# Patient Record
Sex: Female | Born: 1950
Health system: Southern US, Community
[De-identification: ages and names within clinical notes are randomized; demographics above are authoritative.]

## PROBLEM LIST (undated history)

## (undated) ENCOUNTER — Ambulatory Visit: Source: Home / Self Care

## (undated) DIAGNOSIS — Z87891 Personal history of nicotine dependence: Secondary | ICD-10-CM

## (undated) DIAGNOSIS — I5020 Unspecified systolic (congestive) heart failure: Secondary | ICD-10-CM

## (undated) DIAGNOSIS — Z951 Presence of aortocoronary bypass graft: Secondary | ICD-10-CM

## (undated) DIAGNOSIS — I2510 Atherosclerotic heart disease of native coronary artery without angina pectoris: Secondary | ICD-10-CM

## (undated) DIAGNOSIS — K279 Peptic ulcer, site unspecified, unspecified as acute or chronic, without hemorrhage or perforation: Secondary | ICD-10-CM

## (undated) DIAGNOSIS — I10 Essential (primary) hypertension: Secondary | ICD-10-CM

## (undated) DIAGNOSIS — I779 Disorder of arteries and arterioles, unspecified: Secondary | ICD-10-CM

## (undated) DIAGNOSIS — J449 Chronic obstructive pulmonary disease, unspecified: Secondary | ICD-10-CM

## (undated) DIAGNOSIS — I5022 Chronic systolic (congestive) heart failure: Secondary | ICD-10-CM

## (undated) DIAGNOSIS — I4891 Unspecified atrial fibrillation: Secondary | ICD-10-CM

## (undated) HISTORY — PX: CORONARY ARTERY BYPASS GRAFT: SHX141

## (undated) HISTORY — DX: Chronic obstructive pulmonary disease, unspecified: J44.9

## (undated) HISTORY — DX: Peptic ulcer, site unspecified, unspecified as acute or chronic, without hemorrhage or perforation: K27.9

## (undated) HISTORY — PX: OTHER SURGICAL HISTORY: SHX169

## (undated) HISTORY — PX: CHOLECYSTECTOMY: SHX55

## (undated) HISTORY — DX: Unspecified atrial fibrillation: I48.91

## (undated) HISTORY — PX: SPINE SURGERY: SHX786

## (undated) HISTORY — PX: ABDOMINAL HYSTERECTOMY: SHX81

## (undated) SURGERY — Surgical Case
Anesthesia: *Unknown

---

## 2021-04-06 ENCOUNTER — Ambulatory Visit (INDEPENDENT_AMBULATORY_CARE_PROVIDER_SITE_OTHER): Payer: 59

## 2021-04-06 ENCOUNTER — Encounter: Payer: Self-pay | Admitting: Emergency Medicine

## 2021-04-06 ENCOUNTER — Other Ambulatory Visit: Payer: Self-pay

## 2021-04-06 ENCOUNTER — Ambulatory Visit
Admission: EM | Admit: 2021-04-06 | Discharge: 2021-04-06 | Disposition: A | Discharge status: Home / Self Care | Payer: 59

## 2021-04-06 DIAGNOSIS — M25572 Pain in left ankle and joints of left foot: Secondary | ICD-10-CM | POA: Diagnosis not present

## 2021-04-06 DIAGNOSIS — W19XXXA Unspecified fall, initial encounter: Secondary | ICD-10-CM

## 2021-04-06 DIAGNOSIS — S93402A Sprain of unspecified ligament of left ankle, initial encounter: Secondary | ICD-10-CM | POA: Diagnosis not present

## 2021-04-06 HISTORY — DX: Atherosclerotic heart disease of native coronary artery without angina pectoris: I25.10

## 2021-04-06 HISTORY — DX: Essential (primary) hypertension: I10

## 2021-04-06 MED ORDER — HYDROCODONE-ACETAMINOPHEN 5-325 MG PO TABS: 1.0000 | ORAL_TABLET | ORAL | 0 refills | Status: DC | PRN

## 2021-04-06 NOTE — Discharge Instructions (Addendum)
Elevate foot,  Wear brace for 2 weeks.  Return if any problems.

## 2021-04-06 NOTE — ED Triage Notes (Signed)
Pt here for left ankle pain and swelling with bruising noted to toes after fall 2 weeks ago that is not improved

## 2021-04-06 NOTE — ED Provider Notes (Signed)
[facility]    CSN: [id number] Arrival date & time: 04/06/21  0920      History   Chief Complaint Chief Complaint  Patient presents with   Ankle Pain    HPI Monica Zamora is a 70 y.o. female.   The history is provided by the patient. No language interpreter was used.  Ankle Pain Location:  Ankle Time since incident:  2 weeks Injury: yes   Mechanism of injury: fall   Fall:    Entrapped after fall: no   Ankle location:  L ankle Pain details:    Severity:  Moderate   Onset quality:  Gradual   Timing:  Constant   Progression:  Worsening Relieved by:  Nothing Worsened by:  Nothing  Past Medical History:  Diagnosis Date   Coronary artery disease    Hypertension     There are no problems to display for this patient.   History reviewed. No pertinent surgical history.  OB History   No obstetric history on file.      Home Medications    Prior to Admission medications   Medication Sig Start Date End Date Taking? Authorizing Provider  apixaban (ELIQUIS) 5 MG TABS tablet Take 5 mg by mouth 2 (two) times daily.   Yes [provider]  furosemide (LASIX) 40 MG tablet Take 40 mg by mouth.   Yes [provider]  HYDROcodone-acetaminophen (NORCO/VICODIN) 5-325 MG tablet Take 1 tablet by mouth every 4 (four) hours as needed for moderate pain. 04/06/21 04/06/22 Yes Elson Areas, PA-C  metoprolol succinate (TOPROL-XL) 25 MG 24 hr tablet Take 25 mg by mouth daily.   Yes [provider]    Family History History reviewed. No pertinent family history.  Social History Social History   Tobacco Use   Smoking status: Never   Smokeless tobacco: Never  Substance Use Topics   Alcohol use: Not Currently   Drug use: Never     Allergies   Patient has no known allergies.   Review of Systems Review of Systems  All other systems reviewed and are negative.   Physical Exam Triage Vital Signs ED Triage Vitals  Enc Vitals  Group     BP 04/06/21 1030 (!) 177/74     Pulse Rate 04/06/21 1030 69     Resp 04/06/21 1030 20     Temp 04/06/21 1030 97.9 F (36.6 C)     Temp Source 04/06/21 1030 Oral     SpO2 04/06/21 1030 93 %     Weight --      Height --      Head Circumference --      Peak Flow --      Pain Score 04/06/21 1034 6     Pain Loc --      Pain Edu? --      Excl. in GC? --    No data found.  Updated Vital Signs BP (!) 177/74 (BP Location: Left Arm)   Pulse 69   Temp 97.9 F (36.6 C) (Oral)   Resp 20   SpO2 93%   Visual Acuity Right Eye Distance:   Left Eye Distance:   Bilateral Distance:    Right Eye Near:   Left Eye Near:    Bilateral Near:     Physical Exam Vitals and nursing note reviewed.  Constitutional:      Appearance: She is well-developed.  HENT:     Head: Normocephalic.  Cardiovascular:  Rate and Rhythm: Normal rate.  Pulmonary:     Effort: Pulmonary effort is normal.  Abdominal:     General: There is no distension.  Musculoskeletal:        General: Swelling and tenderness present. Normal range of motion.     Cervical back: Normal range of motion.  Skin:    General: Skin is warm.  Neurological:     Mental Status: She is alert and oriented to person, place, and time.     UC Treatments / Results  Labs (all labs ordered are listed, but only abnormal results are displayed) Labs Reviewed - No data to display  EKG   Radiology DG Ankle Complete Left  Result Date: 04/06/2021 CLINICAL DATA:  Acute left ankle pain and swelling after fall 2 weeks ago. EXAM: LEFT ANKLE COMPLETE - 3+ VIEW COMPARISON:  None. FINDINGS: There is no evidence of fracture, dislocation, or joint effusion. There is no evidence of arthropathy or other focal bone abnormality. Soft tissues are unremarkable. IMPRESSION: Negative. Electronically Signed   By: Lupita Raider M.D.   On: 04/06/2021 10:57    Procedures Procedures (including critical Zamora time)  Medications Ordered in  UC Medications - No data to display  Initial Impression / Assessment and Plan / UC Course  I have reviewed the triage vital signs and the nursing notes.  Pertinent labs & imaging results that were available during my Zam[facility] of the patient were reviewed by me and considered in my medical decision making (see chart for details).     MDM:  xray  no fracture  Pt placed in an aso.  Pt advised to return if any problems.  Final Clinical Impressions(s) / UC Diagnoses   Final diagnoses:  Sprain of left ankle, unspecified ligament, initial encounter     Discharge Instructions      Elevate foot,  Wear brace for 2 weeks.  Return if any problems.    ED Prescriptions     Medication Sig Dispense Auth. Provider   HYDROcodone-acetaminophen (NORCO/VICODIN) 5-325 MG tablet Take 1 tablet by mouth every 4 (four) hours as needed for moderate pain. 12 tablet Elson Areas, [place]      I have reviewed the PDMP during this encounter. An After Visit Summary was printed and given to the patient.    Elson Areas, [place] 04/06/21 1514

## 2021-05-06 ENCOUNTER — Ambulatory Visit (INDEPENDENT_AMBULATORY_CARE_PROVIDER_SITE_OTHER): Payer: 59 | Admitting: Family Medicine

## 2021-05-06 ENCOUNTER — Other Ambulatory Visit: Payer: Self-pay

## 2021-05-06 ENCOUNTER — Encounter: Payer: Self-pay | Admitting: Family Medicine

## 2021-05-06 VITALS — BP 142/85 | HR 82 | Resp 20 | Wt 138.0 lb

## 2021-05-06 DIAGNOSIS — I25708 Atherosclerosis of coronary artery bypass graft(s), unspecified, with other forms of angina pectoris: Secondary | ICD-10-CM

## 2021-05-06 DIAGNOSIS — I10 Essential (primary) hypertension: Secondary | ICD-10-CM | POA: Diagnosis not present

## 2021-05-06 DIAGNOSIS — H259 Unspecified age-related cataract: Secondary | ICD-10-CM | POA: Diagnosis not present

## 2021-05-06 DIAGNOSIS — F341 Dysthymic disorder: Secondary | ICD-10-CM

## 2021-05-06 DIAGNOSIS — Z7689 Persons encountering health services in other specified circumstances: Secondary | ICD-10-CM

## 2021-05-06 MED ORDER — VENLAFAXINE HCL ER 37.5 MG PO CP24: 37.5000 mg | ORAL_CAPSULE | Freq: Every day | ORAL | 0 refills | Status: DC

## 2021-05-06 NOTE — Progress Notes (Signed)
New Patient Office Visit  Subjective:  Patient ID: Monica Zamora, female    DOB: [birth year]  Age: 70 y.o. MRN: [id number]  CC:  Chief Complaint  Patient presents with   Establish Care    HPI Monica Zamora presents for to establish care. She has recently relocated to the area 2/2 to family and financial issues. Patient had a CABGx3 in October 2021. She denies recent symptoms. Patient also reports a cataract that is worsening on her left eye. She has had a right sided cataract removed previously.   Past Medical History:  Diagnosis Date   A-fib (HCC)    COPD (chronic obstructive pulmonary disease) (HCC)    Coronary artery disease    Hypertension     Past Surgical History:  Procedure Laterality Date   ABDOMINAL HYSTERECTOMY     ruptured disc     SPINE SURGERY      No family history on file.  Social History   Socioeconomic History   Marital status: Married    Spouse name: Not on file   Number of children: Not on file   Years of education: Not on file   Highest education level: Not on file  Occupational History   Not on file  Tobacco Use   Smoking status: Never   Smokeless tobacco: Never  Substance and Sexual Activity   Alcohol use: Not Currently   Drug use: Never   Sexual activity: Not on file  Other Topics Concern   Not on file  Social History Narrative   Not on file   Social Determinants of Health   Financial Resource Strain: Not on file  Food Insecurity: Not on file  Transportation Needs: Not on file  Physical Activity: Not on file  Stress: Not on file  Social Connections: Not on file  Intimate Partner Violence: Not on file    ROS Review of Systems  Eyes:  Positive for visual disturbance.  Cardiovascular:  Negative for chest pain.  Psychiatric/Behavioral:  Positive for sleep disturbance. Negative for self-injury and suicidal ideas. The patient is nervous/anxious.   All other systems reviewed and are negative.  Objective:   Today's  Vitals: BP (!) 142/85   Pulse 82   Resp 20   Wt 138 lb (62.6 kg)   SpO2 94%   Physical Exam Vitals and nursing note reviewed.  Constitutional:      General: She is not in acute distress. Cardiovascular:     Rate and Rhythm: Normal rate and regular rhythm.  Pulmonary:     Effort: Pulmonary effort is normal.     Breath sounds: Normal breath sounds.  Abdominal:     Palpations: Abdomen is soft.     Tenderness: There is no abdominal tenderness.  Musculoskeletal:     Right lower leg: No edema.     Left lower leg: No edema.  Neurological:     General: No focal deficit present.     Mental Status: She is alert and oriented to person, place, and time.  Psychiatric:        Mood and Affect: Mood normal.        Behavior: Behavior normal.    Assessment & Plan:  1. Essential hypertension Patient referred to cardiology for further eval/mgt - Ambulatory referral to Cardiology  2. Coronary artery disease of bypass graft of native heart with stable angina pectoris [facility]) Referral to cardiology for further eval/mgt - Ambulatory referral to Cardiology  3. Senile cataract of left eye, unspecified age-related  cataract type Referral to consultant for further eval/mgt - Ambulatory referral to Ophthalmology  4. Dysthymia Effexor prescribed - will monitor  5. Encounter to establish care     Outpatient Encounter Medications as of 05/06/2021  Medication Sig   ALPRAZolam (XANAX) 0.5 MG tablet Take 0.5 mg by mouth at bedtime as needed for anxiety.   apixaban (ELIQUIS) 5 MG TABS tablet Take 5 mg by mouth 2 (two) times daily.   aspirin 81 MG chewable tablet Chew by mouth daily.   dronedarone (MULTAQ) 400 MG tablet Take 400 mg by mouth 2 (two) times daily with a meal. Takes 1/2 po daily   furosemide (LASIX) 40 MG tablet Take 40 mg by mouth.   metoprolol succinate (TOPROL-XL) 25 MG 24 hr tablet Take 25 mg by mouth daily.   potassium chloride (KLOR-CON) 20 MEQ packet Take by mouth daily.    [DISCONTINUED] HYDROcodone-acetaminophen (NORCO/VICODIN) 5-325 MG tablet Take 1 tablet by mouth every 4 (four) hours as needed for moderate pain.   No facility-administered encounter medications on file as of 05/06/2021.    Follow-up: Return in about 7 weeks (around 06/24/2021) for follow up, chronic med issues.   Tommie Raymond, MD

## 2021-05-06 NOTE — Progress Notes (Signed)
Establish care - referral to eye specialist and cardiology Moved from [place] in October

## 2021-05-27 ENCOUNTER — Telehealth: Payer: Self-pay | Admitting: Family Medicine

## 2021-05-27 NOTE — Telephone Encounter (Signed)
Patient called the office asking for a referral. Patient states she needs to have surgery on her left eye due to cataract. Patient has already had surgery on her right eye and stated she can barely see out of her left eye.

## 2021-05-28 NOTE — Telephone Encounter (Signed)
Good Afternoon  It looks like the referral wasn't process  sent referral to   Sent Referral to [facility] ph # [phone] address [address] they will contact the patient to schedule an appointment.

## 2021-06-24 ENCOUNTER — Encounter: Payer: Self-pay | Admitting: Family Medicine

## 2021-06-24 ENCOUNTER — Other Ambulatory Visit: Payer: Self-pay

## 2021-06-24 ENCOUNTER — Ambulatory Visit (INDEPENDENT_AMBULATORY_CARE_PROVIDER_SITE_OTHER): Payer: Medicare Other | Admitting: Family Medicine

## 2021-06-24 VITALS — BP 135/75 | HR 52 | Temp 98.0°F | Resp 16 | Wt 142.0 lb

## 2021-06-24 DIAGNOSIS — I10 Essential (primary) hypertension: Secondary | ICD-10-CM

## 2021-06-24 DIAGNOSIS — F341 Dysthymic disorder: Secondary | ICD-10-CM | POA: Diagnosis not present

## 2021-06-24 MED ORDER — FUROSEMIDE 40 MG PO TABS: 40.0000 mg | ORAL_TABLET | Freq: Every day | ORAL | 2 refills | Status: DC

## 2021-06-24 NOTE — Progress Notes (Signed)
Patient said that she is no longer taking Venlafaxin HCI because it made her dizzy.  Patient is doing better today. Patient said she is not under as much stress.

## 2021-06-25 NOTE — Progress Notes (Signed)
Established  Patient Office Visit  Subjective:  Patient ID: Monica [facility], female    DOB: [birth year]  Age: 70 y.o. MRN: [id number]  CC:  Chief Complaint  Patient presents with   Follow-up    HPI Monica Zamora presents for follow up of hypertension and anxiety. Patient reports that she stopped the effexor as she felt dizzy. She also notes that she takes xanax twice a day on a regular basis for the past 2-3  years. She has picked up her last refill from her previous out of state provider.   Past Medical History:  Diagnosis Date   A-fib (HCC)    COPD (chronic obstructive pulmonary disease) (HCC)    Coronary artery disease    Hypertension     Past Surgical History:  Procedure Laterality Date   ABDOMINAL HYSTERECTOMY     ruptured disc     SPINE SURGERY      History reviewed. No pertinent family history.  Social History   Socioeconomic History   Marital status: Married    Spouse name: Not on file   Number of children: Not on file   Years of education: Not on file   Highest education level: Not on file  Occupational History   Not on file  Tobacco Use   Smoking status: Never   Smokeless tobacco: Never  Substance and Sexual Activity   Alcohol use: Not Currently   Drug use: Never   Sexual activity: Not on file  Other Topics Concern   Not on file  Social History Narrative   Not on file   Social Determinants of Health   Financial Resource Strain: Not on file  Food Insecurity: Not on file  Transportation Needs: Not on file  Physical Activity: Not on file  Stress: Not on file  Social Connections: Not on file  Intimate Partner Violence: Not on file    ROS Review of Systems  Psychiatric/Behavioral:  The patient is not nervous/anxious.   All other systems reviewed and are negative.  Objective:   Today's Vitals: BP 135/75   Pulse (!) 52   Temp 98 F (36.7 C) (Oral)   Resp 16   Wt 142 lb (64.4 kg)   SpO2 95%   Physical Exam Vitals and  nursing note reviewed.  Constitutional:      General: She is not in acute distress. Cardiovascular:     Rate and Rhythm: Normal rate and regular rhythm.  Pulmonary:     Effort: Pulmonary effort is normal.     Breath sounds: Normal breath sounds.  Abdominal:     Palpations: Abdomen is soft.     Tenderness: There is no abdominal tenderness.  Musculoskeletal:     Right lower leg: No edema.     Left lower leg: No edema.  Neurological:     General: No focal deficit present.     Mental Status: She is alert and oriented to person, place, and time.  Psychiatric:        Mood and Affect: Mood normal.        Behavior: Behavior normal.    Assessment & Plan:   1. Essential hypertension Improved with present management. continue  2. Dysthymia Appears improved off of the meds. Will continue and monitor. Discussed at length with patient that I am not willing to take on her continued use of Xanax at that level.  Will attempt to refer to behavioral health for further evaluation and management of meds.  Patient also at  this time will try to wean down dosage of present medications.    Outpatient Encounter Medications as of 06/24/2021  Medication Sig   ALPRAZolam (XANAX) 0.5 MG tablet Take 0.5 mg by mouth at bedtime as needed for anxiety.   apixaban (ELIQUIS) 5 MG TABS tablet Take 5 mg by mouth 2 (two) times daily.   aspirin 81 MG chewable tablet Chew by mouth daily.   dronedarone (MULTAQ) 400 MG tablet Take 400 mg by mouth 2 (two) times daily with a meal. Takes 1/2 po daily   metoprolol succinate (TOPROL-XL) 25 MG 24 hr tablet Take 25 mg by mouth daily.   potassium chloride (KLOR-CON) 20 MEQ packet Take by mouth daily.   [DISCONTINUED] furosemide (LASIX) 40 MG tablet Take 40 mg by mouth.   furosemide (LASIX) 40 MG tablet Take 1 tablet (40 mg total) by mouth daily.   venlafaxine XR (EFFEXOR XR) 37.5 MG 24 hr capsule Take 1 capsule (37.5 mg total) by mouth daily with breakfast. (Patient not  taking: Reported on 06/24/2021)   No facility-administered encounter medications on file as of 06/24/2021.    Follow-up: No follow-ups on file.   Tommie Raymond, MD

## 2021-07-02 NOTE — Progress Notes (Signed)
Referring-Monica Andrey Campanile, MD Reason for referral-coronary artery disease and hypertension  HPI: 69 year old female for evaluation of coronary artery disease and hypertension at request of Georganna Skeans, MD.  Patient is status post coronary artery bypass and graft in 2021.  All records are not available.  Patient has some dyspnea on exertion secondary to COPD.  She denies orthopnea, PND, pedal edema, chest pain or syncope.  Cardiology now asked to evaluate.  Current Outpatient Medications  Medication Sig Dispense Refill   ALPRAZolam (XANAX) 0.5 MG tablet Take 0.5 mg by mouth at bedtime as needed for anxiety.     apixaban (ELIQUIS) 5 MG TABS tablet Take 5 mg by mouth 2 (two) times daily.     aspirin 81 MG chewable tablet Chew by mouth daily.     dronedarone (MULTAQ) 400 MG tablet Take 400 mg by mouth 2 (two) times daily with a meal. Takes 1/2 po daily     furosemide (LASIX) 40 MG tablet Take 1 tablet (40 mg total) by mouth daily. 30 tablet 2   metoprolol succinate (TOPROL-XL) 25 MG 24 hr tablet Take 25 mg by mouth daily.     potassium chloride (KLOR-CON) 20 MEQ packet Take by mouth daily.     venlafaxine XR (EFFEXOR XR) 37.5 MG 24 hr capsule Take 1 capsule (37.5 mg total) by mouth daily with breakfast. 90 capsule 0   No current facility-administered medications for this visit.    No Known Allergies   Past Medical History:  Diagnosis Date   A-fib (HCC)    COPD (chronic obstructive pulmonary disease) (HCC)    Coronary artery disease    Hypertension    PUD (peptic ulcer disease)     Past Surgical History:  Procedure Laterality Date   ABDOMINAL HYSTERECTOMY     CHOLECYSTECTOMY     CORONARY ARTERY BYPASS GRAFT     ruptured disc     SPINE SURGERY      Social History   Socioeconomic History   Marital status: Divorced    Spouse name: Not on file   Number of children: 2   Years of education: Not on file   Highest education level: Not on file  Occupational History   Not on  file  Tobacco Use   Smoking status: Every Day    Types: Cigarettes   Smokeless tobacco: Never  Substance and Sexual Activity   Alcohol use: Yes    Comment: Occasional   Drug use: Never   Sexual activity: Not on file  Other Topics Concern   Not on file  Social History Narrative   Not on file   Social Determinants of Health   Financial Resource Strain: Not on file  Food Insecurity: Not on file  Transportation Needs: Not on file  Physical Activity: Not on file  Stress: Not on file  Social Connections: Not on file  Intimate Partner Violence: Not on file    Family History  Problem Relation Age of Onset   Lupus Mother     ROS: no fevers or chills, productive cough, hemoptysis, dysphasia, odynophagia, melena, hematochezia, dysuria, hematuria, rash, seizure activity, orthopnea, PND, pedal edema, claudication. Remaining systems are negative.  Physical Exam:   Blood pressure 130/62, pulse (!) 52, height 5\' 6"  (1.676 m), weight 142 lb (64.4 kg), SpO2 94 %.  General:  Well developed/well nourished in NAD Skin warm/dry Patient not depressed No peripheral clubbing Back-normal HEENT-normal/normal eyelids Neck supple/normal carotid upstroke bilaterally; no bruits; no JVD; no thyromegaly chest -  CTA/ normal expansion CV - RRR/normal S1 and S2; no murmurs, rubs or gallops;  PMI nondisplaced Abdomen -NT/ND, no HSM, no mass, + bowel sounds, no bruit 1+ femoral pulses, no bruits Ext-no edema, chords, 1+ DP Neuro-grossly nonfocal  ECG -sinus bradycardia at a rate of 52, nonspecific ST changes.  Personally reviewed  A/P  1 coronary artery disease status post coronary artery bypass graft-plan to continue aspirin.  Add statin.  She is not having chest pain.  We will obtain all records from [place] concerning her previous surgery.  2 hypertension-blood pressure controlled.  Continue present medications and follow.  Check potassium and renal function.  3 hyperlipidemia-add Crestor 40  mg daily given history of coronary disease.  Check lipids and liver in 8 weeks.  4 carotid artery disease-she apparently had some carotid disease at time of previous surgery.  We will obtain all previous records and schedule follow-up Dopplers as needed.  5 paroxysmal atrial fibrillation-we will continue apixaban.  CHA2DS2-VASc is 4.  We will continue dronedarone.  6 tobacco abuse-patient counseled on discontinuing.  Olga Millers, MD

## 2021-07-08 ENCOUNTER — Ambulatory Visit (INDEPENDENT_AMBULATORY_CARE_PROVIDER_SITE_OTHER): Payer: Medicare Other | Admitting: Cardiology

## 2021-07-08 ENCOUNTER — Other Ambulatory Visit: Payer: Self-pay

## 2021-07-08 ENCOUNTER — Encounter: Payer: Self-pay | Admitting: Cardiology

## 2021-07-08 VITALS — BP 130/62 | HR 52 | Ht 66.0 in | Wt 142.0 lb

## 2021-07-08 DIAGNOSIS — E7800 Pure hypercholesterolemia, unspecified: Secondary | ICD-10-CM

## 2021-07-08 DIAGNOSIS — I10 Essential (primary) hypertension: Secondary | ICD-10-CM | POA: Diagnosis not present

## 2021-07-08 DIAGNOSIS — I480 Paroxysmal atrial fibrillation: Secondary | ICD-10-CM | POA: Diagnosis not present

## 2021-07-08 DIAGNOSIS — I25810 Atherosclerosis of coronary artery bypass graft(s) without angina pectoris: Secondary | ICD-10-CM | POA: Diagnosis not present

## 2021-07-08 MED ORDER — ROSUVASTATIN CALCIUM 40 MG PO TABS: 40.0000 mg | ORAL_TABLET | Freq: Every day | ORAL | 3 refills | Status: DC

## 2021-07-08 NOTE — Patient Instructions (Signed)
Medication Instructions:   START ROSUVASTATIN 40 MG ONCE DAILY  *If you need a refill on your cardiac medications before your next appointment, please call your pharmacy*   Lab Work:  Your physician recommends that you return for lab work in: [facility]  If you have labs (blood work) drawn today and your tests are completely normal, you will receive your results only by: MyChart Message (if you have MyChart) OR A paper copy in the mail If you have any lab test that is abnormal or we need to change your treatment, we will call you to review the results.   Follow-Up: At [facility], you and your health needs are our priority.  As part of our continuing mission to provide you with exceptional heart care, we have created designated Provider Care Teams.  These Care Teams include your primary Cardiologist (physician) and Advanced Practice Providers (APPs -  Physician Assistants and Nurse Practitioners) who all work together to provide you with the care you need, when you need it.  We recommend signing up for the patient portal called "MyChart".  Sign up information is provided on this After Visit Summary.  MyChart is used to connect with patients for Virtual Visits (Telemedicine).  Patients are able to view lab/test results, encounter notes, upcoming appointments, etc.  Non-urgent messages can be sent to your provider as well.   To learn more about what you can do with MyChart, go to [email or web address].    Your next appointment:   12 month(s)  The format for your next appointment:   In Person  Provider:   Olga Millers, MD

## 2021-07-12 ENCOUNTER — Telehealth: Payer: Self-pay | Admitting: Cardiology

## 2021-07-12 DIAGNOSIS — I25810 Atherosclerosis of coronary artery bypass graft(s) without angina pectoris: Secondary | ICD-10-CM

## 2021-07-12 MED ORDER — ROSUVASTATIN CALCIUM 10 MG PO TABS: 10.0000 mg | ORAL_TABLET | Freq: Every day | ORAL | 3 refills | Status: DC

## 2021-07-12 NOTE — Telephone Encounter (Signed)
Spoke with patient of Dr. Jens Som - first visit 10/24 H/O CABG  She reports she took a dose of crestor at night, woke up next day with pressure, heaviness in her chest -- lasted all day. She only took 1 dose. Her symptoms have resolved today, off medication   She does not report this is like any cardiac pain  Advised will send message to MD/RN/Pharmacy team to review and advise on cholesterol management options

## 2021-07-12 NOTE — Telephone Encounter (Signed)
Pt c/o medication issue:  1. Name of Medication: rosuvastatin (CRESTOR) 40 MG tablet  2. How are you currently taking this medication (dosage and times per day)? Take 1 tablet (40 mg total) by mouth daily.  3. Are you having a reaction (difficulty breathing--STAT)? no  4. What is your medication issue? It made her feel weird.  It made the middle of her chest feel like she had pressure more intense than she normally has it.  She just felt different than she normally feels, it's hard for her to describe it.

## 2021-07-12 NOTE — Telephone Encounter (Signed)
Spoke with pt, Aware of dr crenshaw's recommendations. New script sent to the pharmacy  

## 2021-07-24 ENCOUNTER — Other Ambulatory Visit: Payer: Self-pay | Admitting: Family Medicine

## 2021-07-26 ENCOUNTER — Telehealth: Payer: Self-pay

## 2021-07-26 NOTE — Telephone Encounter (Signed)
Pt called in requesting a refill on her Xanax medication Pharmacy: [facility] - [address] [place]

## 2021-07-29 ENCOUNTER — Other Ambulatory Visit: Payer: Self-pay | Admitting: Family Medicine

## 2021-07-29 ENCOUNTER — Telehealth: Payer: Self-pay | Admitting: Family Medicine

## 2021-07-29 ENCOUNTER — Encounter: Payer: Self-pay | Admitting: Cardiology

## 2021-07-29 MED ORDER — ALPRAZOLAM 0.5 MG PO TABS: 0.5000 mg | ORAL_TABLET | Freq: Every evening | ORAL | 0 refills | Status: DC | PRN

## 2021-07-29 NOTE — Telephone Encounter (Signed)
Monica Zamora called with Roddie Mc on the phone. Manna gave verbal approval for Monica Zamora to be called back in regards to this. Monica Zamora advised she can call Mackena to have her on the line when calling back if necessary   Pt c/o medication issue:  1. Name of Medication:  rosuvastatin (CRESTOR) 10 MG tablet  2. How are you currently taking this medication (dosage and times per day)? Stopped taking it around 10/30-10/31  3. Are you having a reaction (difficulty breathing--STAT)? Yes   4. What is your medication issue? States she stopped taking this medication due to it still making her feel weird and causing pressure in her chest with decrease. Since stopping medication symptoms have mostly stopped. Still having occasional pressure on chest if she does not take water pill everyday. Confused as to why medication was prescribed without blood work being performed prior to it being prescribed.    Pt c/o of Chest Pain: STAT if CP now or developed within 24 hours  1. Are you having CP right now? No   2. Are you experiencing any other symptoms (ex. SOB, nausea, vomiting, sweating)? Had  numbness right above left breast that comes and goes also neck pain shoot up on left side when chest pressure last occurred this past weekend   3. How long have you been experiencing CP? Has been more consistent past few weeks    4. Is your CP continuous or coming and going? Coming and going   5. Have you taken Nitroglycerin? No    Also states she has recently stopped Xanax cold [place] because she has not gotten a refill from new PCP yet. She has been on xanax for a few years. Has noticed a difference with chest pressure issues since stopping xanax.    Monica Zamora and patient are also working on getting records from [place] Cardiologist faxed to the office.  ?

## 2021-07-29 NOTE — Telephone Encounter (Signed)
Tried to call "Tammy" LM2CB Tried to call pt no VM set un-unable to [facility]

## 2021-07-29 NOTE — Telephone Encounter (Signed)
   Tammy called back she said she called pt's previous cards at [place] and they were told Dr. Jens Som needs to request it. She gave info:  Dr. Micheal Likens [facility] fax#       [phone] phone#  [phone]

## 2021-07-29 NOTE — Telephone Encounter (Signed)
Pt called in to check requesting a refill on her Xanax medication again. Pt states she was told to cut back to tablet per day but ran out on Saturday. Please advise and thank you  Pharmacy: [facility] - [address] [place]

## 2021-07-29 NOTE — Telephone Encounter (Signed)
Returned call to Tammy she states that pt has c/o left neck pain and numbness in her left arm that is intermittent. She states that this has happened "quite a few times" at least since Saturday. She also states that pt cannot take her Crestor because "it makes her feel funny, and have chest pain". So she has stopped. Tammy also states that pt does not take her Lasix daily she will "miss some days" she has repeatedly told her she has to make sure to take this daily. Pt is very forgetful and Tammy thinks that she is "getting Alzheimer's, or something" also pt drinks daily 3-4 beers and some times beer and a "couple bourbons" also. I have made the next available appt in Jan with APP to discuss this and told Tammy to take pt to the ER should this happen again. Tammy states that pt will not go to the ER. Pt does have a BP cuff, she will take her BP/HR should this happen again also.  [place]" to request pt recent records, Tammy states that she did try to call and have them faxed to [place] but they will not fax they "need a signed release" per office.

## 2021-07-29 NOTE — Telephone Encounter (Signed)
error 

## 2021-08-08 ENCOUNTER — Inpatient Hospital Stay (HOSPITAL_COMMUNITY)
Admission: EM | Admit: 2021-08-08 | Discharge: 2021-08-12 | DRG: 190 | Disposition: A | Discharge status: Home / Self Care | Payer: Medicare Other | Attending: Internal Medicine | Admitting: Internal Medicine

## 2021-08-08 ENCOUNTER — Encounter (HOSPITAL_COMMUNITY): Payer: Self-pay

## 2021-08-08 ENCOUNTER — Emergency Department (HOSPITAL_COMMUNITY): Payer: Medicare Other

## 2021-08-08 DIAGNOSIS — I6529 Occlusion and stenosis of unspecified carotid artery: Secondary | ICD-10-CM | POA: Diagnosis present

## 2021-08-08 DIAGNOSIS — D6959 Other secondary thrombocytopenia: Secondary | ICD-10-CM | POA: Diagnosis present

## 2021-08-08 DIAGNOSIS — Z23 Encounter for immunization: Secondary | ICD-10-CM

## 2021-08-08 DIAGNOSIS — R778 Other specified abnormalities of plasma proteins: Secondary | ICD-10-CM | POA: Diagnosis present

## 2021-08-08 DIAGNOSIS — Z832 Family history of diseases of the blood and blood-forming organs and certain disorders involving the immune mechanism: Secondary | ICD-10-CM

## 2021-08-08 DIAGNOSIS — J441 Chronic obstructive pulmonary disease with (acute) exacerbation: Secondary | ICD-10-CM | POA: Diagnosis not present

## 2021-08-08 DIAGNOSIS — J449 Chronic obstructive pulmonary disease, unspecified: Secondary | ICD-10-CM | POA: Diagnosis present

## 2021-08-08 DIAGNOSIS — F17210 Nicotine dependence, cigarettes, uncomplicated: Secondary | ICD-10-CM | POA: Diagnosis present

## 2021-08-08 DIAGNOSIS — I10 Essential (primary) hypertension: Secondary | ICD-10-CM

## 2021-08-08 DIAGNOSIS — Z7901 Long term (current) use of anticoagulants: Secondary | ICD-10-CM

## 2021-08-08 DIAGNOSIS — Z951 Presence of aortocoronary bypass graft: Secondary | ICD-10-CM

## 2021-08-08 DIAGNOSIS — Z20822 Contact with and (suspected) exposure to covid-19: Secondary | ICD-10-CM | POA: Diagnosis present

## 2021-08-08 DIAGNOSIS — E785 Hyperlipidemia, unspecified: Secondary | ICD-10-CM

## 2021-08-08 DIAGNOSIS — R7989 Other specified abnormal findings of blood chemistry: Secondary | ICD-10-CM | POA: Diagnosis present

## 2021-08-08 DIAGNOSIS — F419 Anxiety disorder, unspecified: Secondary | ICD-10-CM | POA: Diagnosis present

## 2021-08-08 DIAGNOSIS — F1010 Alcohol abuse, uncomplicated: Secondary | ICD-10-CM | POA: Diagnosis present

## 2021-08-08 DIAGNOSIS — J9601 Acute respiratory failure with hypoxia: Secondary | ICD-10-CM | POA: Diagnosis present

## 2021-08-08 DIAGNOSIS — Z90710 Acquired absence of both cervix and uterus: Secondary | ICD-10-CM

## 2021-08-08 DIAGNOSIS — I2510 Atherosclerotic heart disease of native coronary artery without angina pectoris: Secondary | ICD-10-CM

## 2021-08-08 DIAGNOSIS — Z79899 Other long term (current) drug therapy: Secondary | ICD-10-CM

## 2021-08-08 DIAGNOSIS — J9811 Atelectasis: Secondary | ICD-10-CM | POA: Diagnosis present

## 2021-08-08 DIAGNOSIS — B974 Respiratory syncytial virus as the cause of diseases classified elsewhere: Secondary | ICD-10-CM | POA: Diagnosis present

## 2021-08-08 DIAGNOSIS — Z8711 Personal history of peptic ulcer disease: Secondary | ICD-10-CM

## 2021-08-08 DIAGNOSIS — I480 Paroxysmal atrial fibrillation: Secondary | ICD-10-CM

## 2021-08-08 DIAGNOSIS — E876 Hypokalemia: Secondary | ICD-10-CM | POA: Diagnosis present

## 2021-08-08 DIAGNOSIS — Z7982 Long term (current) use of aspirin: Secondary | ICD-10-CM

## 2021-08-08 DIAGNOSIS — D696 Thrombocytopenia, unspecified: Secondary | ICD-10-CM

## 2021-08-08 HISTORY — DX: Presence of aortocoronary bypass graft: Z95.1

## 2021-08-08 LAB — CBC WITH DIFFERENTIAL/PLATELET
Abs Immature Granulocytes: 0.03 10*3/uL (ref 0.00–0.07)
Basophils Absolute: 0 10*3/uL (ref 0.0–0.1)
Basophils Relative: 0 %
Eosinophils Absolute: 0 10*3/uL (ref 0.0–0.5)
Eosinophils Relative: 0 %
HCT: 38.5 % (ref 36.0–46.0)
Hemoglobin: 13.4 g/dL (ref 12.0–15.0)
Immature Granulocytes: 1 %
Lymphocytes Relative: 12 %
Lymphs Abs: 0.6 10*3/uL — ABNORMAL LOW (ref 0.7–4.0)
MCH: 36.2 pg — ABNORMAL HIGH (ref 26.0–34.0)
MCHC: 34.8 g/dL (ref 30.0–36.0)
MCV: 104.1 fL — ABNORMAL HIGH (ref 80.0–100.0)
Monocytes Absolute: 0.3 10*3/uL (ref 0.1–1.0)
Monocytes Relative: 6 %
Neutro Abs: 3.8 10*3/uL (ref 1.7–7.7)
Neutrophils Relative %: 81 %
Platelets: 125 10*3/uL — ABNORMAL LOW (ref 150–400)
RBC: 3.7 MIL/uL — ABNORMAL LOW (ref 3.87–5.11)
RDW: 12.4 % (ref 11.5–15.5)
WBC: 4.7 10*3/uL (ref 4.0–10.5)
nRBC: 0 % (ref 0.0–0.2)

## 2021-08-08 LAB — RESP PANEL BY RT-PCR (FLU A&B, COVID) ARPGX2
Influenza A by PCR: NEGATIVE
Influenza B by PCR: NEGATIVE
SARS Coronavirus 2 by RT PCR: NEGATIVE

## 2021-08-08 LAB — COMPREHENSIVE METABOLIC PANEL
ALT: 12 U/L (ref 0–44)
AST: 25 U/L (ref 15–41)
Albumin: 3.6 g/dL (ref 3.5–5.0)
Alkaline Phosphatase: 89 U/L (ref 38–126)
Anion gap: 12 (ref 5–15)
BUN: 13 mg/dL (ref 8–23)
CO2: 25 mmol/L (ref 22–32)
Calcium: 8.6 mg/dL — ABNORMAL LOW (ref 8.9–10.3)
Chloride: 95 mmol/L — ABNORMAL LOW (ref 98–111)
Creatinine, Ser: 1.13 mg/dL — ABNORMAL HIGH (ref 0.44–1.00)
GFR, Estimated: 53 mL/min — ABNORMAL LOW (ref >60)
Glucose, Bld: 133 mg/dL — ABNORMAL HIGH (ref 70–99)
Potassium: 3.4 mmol/L — ABNORMAL LOW (ref 3.5–5.1)
Sodium: 132 mmol/L — ABNORMAL LOW (ref 135–145)
Total Bilirubin: 0.7 mg/dL (ref 0.3–1.2)
Total Protein: 7 g/dL (ref 6.5–8.1)

## 2021-08-08 LAB — MAGNESIUM: Magnesium: 1.7 mg/dL (ref 1.7–2.4)

## 2021-08-08 LAB — TROPONIN I (HIGH SENSITIVITY)
Troponin I (High Sensitivity): 23 ng/L — ABNORMAL HIGH (ref <18)
Troponin I (High Sensitivity): 26 ng/L — ABNORMAL HIGH (ref <18)

## 2021-08-08 LAB — PROCALCITONIN: Procalcitonin: 3.46 ng/mL

## 2021-08-08 LAB — HIV ANTIBODY (ROUTINE TESTING W REFLEX): HIV Screen 4th Generation wRfx: NONREACTIVE

## 2021-08-08 LAB — BRAIN NATRIURETIC PEPTIDE: B Natriuretic Peptide: 876.6 pg/mL — ABNORMAL HIGH (ref 0.0–100.0)

## 2021-08-08 LAB — ETHANOL: Alcohol, Ethyl (B): <10 mg/dL (ref <10)

## 2021-08-08 LAB — VITAMIN B12: Vitamin B-12: 230 pg/mL (ref 180–914)

## 2021-08-08 LAB — FOLATE: Folate: 19.9 ng/mL (ref >5.9)

## 2021-08-08 MED ORDER — METHYLPREDNISOLONE SODIUM SUCC 125 MG IJ SOLR
125.0000 mg | Freq: Once | INTRAMUSCULAR | Status: AC
  Administered 2021-08-08: 125 mg via INTRAVENOUS
  Filled 2021-08-08: qty 2

## 2021-08-08 MED ORDER — LACTATED RINGERS IV SOLN: INTRAVENOUS | Status: AC

## 2021-08-08 MED ORDER — POTASSIUM CHLORIDE CRYS ER 20 MEQ PO TBCR
20.0000 meq | EXTENDED_RELEASE_TABLET | Freq: Every day | ORAL | Status: DC
  Administered 2021-08-09 – 2021-08-12 (×4): 20 meq via ORAL
  Filled 2021-08-08 (×4): qty 1

## 2021-08-08 MED ORDER — ASPIRIN 81 MG PO CHEW
81.0000 mg | CHEWABLE_TABLET | Freq: Every day | ORAL | Status: DC
  Administered 2021-08-08 – 2021-08-12 (×5): 81 mg via ORAL
  Filled 2021-08-08 (×4): qty 1

## 2021-08-08 MED ORDER — THIAMINE HCL 100 MG PO TABS
100.0000 mg | ORAL_TABLET | Freq: Every day | ORAL | Status: DC
  Administered 2021-08-09 – 2021-08-12 (×4): 100 mg via ORAL
  Filled 2021-08-08 (×5): qty 1

## 2021-08-08 MED ORDER — ALPRAZOLAM 0.5 MG PO TABS
0.5000 mg | ORAL_TABLET | Freq: Every day | ORAL | Status: DC
  Administered 2021-08-08 – 2021-08-12 (×5): 0.5 mg via ORAL
  Filled 2021-08-08: qty 1
  Filled 2021-08-08: qty 2
  Filled 2021-08-08 (×3): qty 1

## 2021-08-08 MED ORDER — SODIUM CHLORIDE 0.9 % IV BOLUS
1000.0000 mL | Freq: Once | INTRAVENOUS | Status: AC
  Administered 2021-08-08: 1000 mL via INTRAVENOUS

## 2021-08-08 MED ORDER — METOPROLOL TARTRATE 12.5 MG HALF TABLET
12.5000 mg | ORAL_TABLET | Freq: Two times a day (BID) | ORAL | Status: DC
  Administered 2021-08-08 – 2021-08-12 (×8): 12.5 mg via ORAL
  Filled 2021-08-08 (×8): qty 1

## 2021-08-08 MED ORDER — IPRATROPIUM-ALBUTEROL 0.5-2.5 (3) MG/3ML IN SOLN
3.0000 mL | Freq: Four times a day (QID) | RESPIRATORY_TRACT | Status: DC
  Administered 2021-08-08: 3 mL via RESPIRATORY_TRACT
  Filled 2021-08-08: qty 3

## 2021-08-08 MED ORDER — ADULT MULTIVITAMIN W/MINERALS CH
1.0000 | ORAL_TABLET | Freq: Every day | ORAL | Status: DC
  Administered 2021-08-08 – 2021-08-12 (×5): 1 via ORAL
  Filled 2021-08-08 (×5): qty 1

## 2021-08-08 MED ORDER — POTASSIUM CHLORIDE CRYS ER 20 MEQ PO TBCR
40.0000 meq | EXTENDED_RELEASE_TABLET | Freq: Once | ORAL | Status: AC
  Administered 2021-08-08: 40 meq via ORAL
  Filled 2021-08-08: qty 2

## 2021-08-08 MED ORDER — ACETAMINOPHEN 325 MG PO TABS
650.0000 mg | ORAL_TABLET | Freq: Four times a day (QID) | ORAL | Status: DC | PRN
  Administered 2021-08-08 – 2021-08-10 (×4): 650 mg via ORAL
  Filled 2021-08-08 (×4): qty 2

## 2021-08-08 MED ORDER — IPRATROPIUM-ALBUTEROL 0.5-2.5 (3) MG/3ML IN SOLN
3.0000 mL | Freq: Once | RESPIRATORY_TRACT | Status: AC
  Administered 2021-08-08: 3 mL via RESPIRATORY_TRACT
  Filled 2021-08-08: qty 3

## 2021-08-08 MED ORDER — APIXABAN 5 MG PO TABS
5.0000 mg | ORAL_TABLET | Freq: Two times a day (BID) | ORAL | Status: DC
  Administered 2021-08-08 – 2021-08-12 (×8): 5 mg via ORAL
  Filled 2021-08-08 (×9): qty 1

## 2021-08-08 MED ORDER — LEVALBUTEROL HCL 0.63 MG/3ML IN NEBU: 0.6300 mg | INHALATION_SOLUTION | Freq: Four times a day (QID) | RESPIRATORY_TRACT | Status: DC | PRN

## 2021-08-08 MED ORDER — FOLIC ACID 1 MG PO TABS
1.0000 mg | ORAL_TABLET | Freq: Every day | ORAL | Status: DC
  Administered 2021-08-08 – 2021-08-12 (×5): 1 mg via ORAL
  Filled 2021-08-08 (×5): qty 1

## 2021-08-08 MED ORDER — SODIUM CHLORIDE 0.9 % IV SOLN
1.0000 g | Freq: Once | INTRAVENOUS | Status: AC
  Administered 2021-08-08: 1 g via INTRAVENOUS
  Filled 2021-08-08: qty 10

## 2021-08-08 MED ORDER — GUAIFENESIN ER 600 MG PO TB12
600.0000 mg | ORAL_TABLET | Freq: Two times a day (BID) | ORAL | Status: DC
  Administered 2021-08-08 – 2021-08-11 (×6): 600 mg via ORAL
  Filled 2021-08-08 (×6): qty 1

## 2021-08-08 MED ORDER — ALBUTEROL SULFATE (2.5 MG/3ML) 0.083% IN NEBU
2.5000 mg | INHALATION_SOLUTION | Freq: Once | RESPIRATORY_TRACT | Status: AC
  Administered 2021-08-08: 2.5 mg via RESPIRATORY_TRACT
  Filled 2021-08-08: qty 3

## 2021-08-08 MED ORDER — SODIUM CHLORIDE 0.9 % IV SOLN
500.0000 mg | Freq: Once | INTRAVENOUS | Status: AC
  Administered 2021-08-08: 500 mg via INTRAVENOUS
  Filled 2021-08-08: qty 500

## 2021-08-08 MED ORDER — FUROSEMIDE 40 MG PO TABS
40.0000 mg | ORAL_TABLET | Freq: Every day | ORAL | Status: DC
  Administered 2021-08-09 – 2021-08-11 (×3): 40 mg via ORAL
  Filled 2021-08-08 (×3): qty 1

## 2021-08-08 MED ORDER — SODIUM CHLORIDE 0.9 % IV SOLN: 500.0000 mg | INTRAVENOUS | Status: DC

## 2021-08-08 MED ORDER — DRONEDARONE HCL 400 MG PO TABS
200.0000 mg | ORAL_TABLET | Freq: Two times a day (BID) | ORAL | Status: DC
  Administered 2021-08-08 – 2021-08-12 (×8): 200 mg via ORAL
  Filled 2021-08-08 (×9): qty 1

## 2021-08-08 MED ORDER — THIAMINE HCL 100 MG/ML IJ SOLN
100.0000 mg | Freq: Every day | INTRAMUSCULAR | Status: DC
  Administered 2021-08-08: 100 mg via INTRAVENOUS
  Filled 2021-08-08: qty 2

## 2021-08-08 MED ORDER — NICOTINE 14 MG/24HR TD PT24
14.0000 mg | MEDICATED_PATCH | Freq: Every day | TRANSDERMAL | Status: DC
  Administered 2021-08-09 – 2021-08-12 (×4): 14 mg via TRANSDERMAL
  Filled 2021-08-08 (×4): qty 1

## 2021-08-08 MED ORDER — METHYLPREDNISOLONE SODIUM SUCC 125 MG IJ SOLR
80.0000 mg | Freq: Two times a day (BID) | INTRAMUSCULAR | Status: DC
  Administered 2021-08-09: 80 mg via INTRAVENOUS
  Filled 2021-08-08: qty 2

## 2021-08-08 MED ORDER — ALPRAZOLAM 0.25 MG PO TABS: 0.5000 mg | ORAL_TABLET | Freq: Two times a day (BID) | ORAL | Status: DC

## 2021-08-08 MED ORDER — ACETAMINOPHEN 650 MG RE SUPP: 650.0000 mg | Freq: Four times a day (QID) | RECTAL | Status: DC | PRN

## 2021-08-08 NOTE — ED Provider Notes (Signed)
Care transferred to me.  Patient is feeling a little better after initial breathing treatment.  However she is still tachypneic and when I turned off her oxygen she still drops into the high 80s.  She still has some wheezing/crackling.  X-ray shows atelectasis though this could be pneumonia.  I think in the setting of a COPD exacerbation she will be given steroids and antibiotics.  We will give another breathing treatment but she will need admission.  She has had having some chest pain this been ongoing for couple days and a low-level troponin is probably more reactive to pneumonia/hypoxia than true ACS.  Second troponin will be revealing. Dr. Artis Flock will admit   Pricilla Loveless, MD 08/08/21 [phone]

## 2021-08-08 NOTE — ED Triage Notes (Signed)
Here for shortness of breath and chest pain x 4 days. Also reports chills, productive cough. 83% on RA. O2 at 2LPM [place] initiated.

## 2021-08-08 NOTE — H&P (Addendum)
And History and Physical    Monica Zamora [id number] DOB: 17-May-1951 DOA: 08/08/2021  PCP: Dorna Mai, MD Consultants:  cardiology: Dr. Stanford Breed  Patient coming from:  Home - lives with friend, Monica Zamora   Chief Complaint: shortness of breath   HPI: Monica Zamora is a 70 y.o. female with medical history significant of atrial fibrillation, COPD, CAD with hx of CABG, HTN, who presented to ED with  worsening shortness of breath and cough. Symptoms started Sunday night with chills and fatigue. Tuesday night she started to get a cough, congestion. On Wednesday she had worsening cough and shortness of breath. Cough has been productive with increased sputum. It's thicker and green. She has not checked her oxygen since last Saturday and it was 93%. She has had no recorded fevers at home. Family member had a head cold last week. She has used her inhaler more and it helps a little bit. She is smoker, but "stopped" on Monday. Down to 3-4/day. Has been hospitalized for COPD in the past.   She has a headache, no dizziness. She has some chest tightness and pain after a coughing fit, no palpitations, no stomach pain, N/V/D, leg swelling, dysuria or other urinary symptoms.   After reading through chart read OV note about daily alcohol intake on 07/12/21. When I went back in and asked her about this, she states she was drinking daily, but last drank on Friday of last week. Denies any withdrawal symptoms.   Covid/flu negative. She has not had her flu shot.   ED Course: vitals: Temperature 99.9, blood pressure 183/77, heart rate 87, respiratory rate 25, oxygen 83% on room air Pertinent labs: MCV 104, platelets 125, sodium 132, potassium 3.4, creatinine 1.13, COVID and flu negative. Troponin 23>pending  CXR: Cardiomegaly with right basilar atelectasis/scarring In ED she received 1 L bolus, potassium replacement IV Solu-Medrol, DuoNeb albuterol as well as Rocephin and Zithromax.  Blood  cultures were also obtained and [facility] was asked to admit for COPD exacerbation.  Review of Systems: As per HPI; otherwise review of systems reviewed and negative.   Ambulatory Status:  Ambulates without assistance   Past Medical History:  Diagnosis Date   A-fib (HCC)    COPD (chronic obstructive pulmonary disease) ([place] [facility])    Coronary artery disease    Hx of CABG    Hypertension    PUD (peptic ulcer disease)     Past Surgical History:  Procedure Laterality Date   ABDOMINAL HYSTERECTOMY     CHOLECYSTECTOMY     CORONARY ARTERY BYPASS GRAFT     ruptured disc     SPINE SURGERY      Social History   Socioeconomic History   Marital status: Divorced    Spouse name: Not on file   Number of children: 2   Years of education: Not on file   Highest education level: Not on file  Occupational History   Not on file  Tobacco Use   Smoking status: Every Day    Packs/day: 0.25    Types: Cigarettes   Smokeless tobacco: Never  Substance and Sexual Activity   Alcohol use: Yes    Comment: Occasional   Drug use: Never   Sexual activity: Not on file  Other Topics Concern   Not on file  Social History Narrative   Not on file   Social Determinants of Health   Financial Resource Strain: Not on file  Food Insecurity: Not on file  Transportation Needs: Not on file  Physical Activity: Not on file  Stress: Not on file  Social Connections: Not on file  Intimate Partner Violence: Not on file    No Known Allergies  Family History  Problem Relation Age of Onset   Lupus Mother     Prior to Admission medications   Medication Sig Start Date End Date Taking? Authorizing Provider  ALPRAZolam Duanne Moron) 0.5 MG tablet Take 1 tablet (0.5 mg total) by mouth at bedtime as needed for anxiety. 07/29/21   Dorna Mai, MD  apixaban (ELIQUIS) 5 MG TABS tablet Take 5 mg by mouth 2 (two) times daily.    [provider]  aspirin 81 MG chewable tablet Chew by mouth daily.    [provider]  dronedarone (MULTAQ) 400 MG tablet Take 400 mg by mouth 2 (two) times daily with a meal. Takes 1/2 po daily    [provider]  furosemide (LASIX) 40 MG tablet Take 1 tablet (40 mg total) by mouth daily. 06/24/21   Dorna Mai, MD  metoprolol succinate (TOPROL-XL) 25 MG 24 hr tablet Take 25 mg by mouth daily.    [provider]  potassium chloride (KLOR-CON) 20 MEQ packet Take by mouth daily.    [provider]  rosuvastatin (CRESTOR) 10 MG tablet Take 1 tablet (10 mg total) by mouth daily. 07/12/21 10/10/21  Lelon Perla, MD  venlafaxine XR (EFFEXOR XR) 37.5 MG 24 hr capsule Take 1 capsule (37.5 mg total) by mouth daily with breakfast. 05/06/21   Dorna Mai, MD    Physical Exam: Vitals:   08/08/21 1600 08/08/21 1630 08/08/21 1700 08/08/21 1730  BP: (!) 160/73 (!) 171/65 (!) 165/69 (!) 157/71  Pulse: 84 85 83 82  Resp: (!) 25 (!) 30 (!) 34 (!) 31  Temp:      TempSrc:      SpO2: 100% 99% 97% 94%  Weight:      Height:         General:  Appears calm and comfortable and is in NAD Eyes:  PERRL, EOMI, normal lids, iris ENT:  grossly normal hearing, lips & tongue, mmm; appropriate dentition Neck:  no LAD, masses or thyromegaly; no carotid bruits Cardiovascular:  RRR, no m/r/g. No LE edema.  Respiratory:   decreased air movement throughout, greatest in right lower lobe. Rhonchi throughout with occasional expiratory wheeze. No crackles  Normal respiratory effort. Abdomen:  soft, NT, ND, NABS Back:   normal alignment, no CVAT Skin:  no rash or induration seen on limited exam Musculoskeletal:  grossly normal tone BUE/BLE, good ROM, no bony abnormality Lower extremity:  No LE edema.  Limited foot exam with no ulcerations.  2+ distal pulses. Psychiatric:  grossly normal mood and affect, speech fluent and appropriate, AOx3 Neurologic:  CN 2-12 grossly intact, moves all extremities in coordinated fashion, sensation intact    Radiological  Exams on Admission: Independently reviewed - see discussion in A/P where applicable  DG Chest Portable 1 View  Result Date: 08/08/2021 CLINICAL DATA:  70 year old female with shortness of breath and chest pain EXAM: PORTABLE CHEST 1 VIEW COMPARISON:  None. FINDINGS: Cardiomegaly and median sternotomy noted. RIGHT basilar atelectasis/scarring noted. There is no evidence of focal airspace disease, pulmonary edema, suspicious pulmonary nodule/mass, pleural effusion, or pneumothorax. No acute bony abnormalities are identified. IMPRESSION: Cardiomegaly with RIGHT basilar atelectasis/scarring. Electronically Signed   By: Margarette Canada M.D.   On: 08/08/2021 16:05    EKG: Independently reviewed.  NSR with rate 91; nonspecific ST changes  with no evidence of acute ischemia. Faster than previous readings.    Labs on Admission: I have personally reviewed the available labs and imaging studies at the time of the admission.  Pertinent labs:  MCV 104,  platelets 125,  sodium 132,  potassium 3.4,  creatinine 1.13, COVID and flu negative.  Troponin 23>pending    Assessment/Plan Active Problems:   COPD with acute exacerbation (HCC) Acute on chronic respiratory failure associated with a COPD exacerbation -70 year old female presenting with worsening shortness of breath, increased cough with acute respiratory failure with hypoxia requiring oxygen. Initially was 83% on room air with increased work of breathing.  -Patient's shortness of breath and productive cough are most likely caused by acute COPD exacerbation.  -Chest x-ray is not consistent with pneumonia -will observe for now and attempt to wean off [place] O2 if possible -Nebulizers: scheduled Duoneb and prn xopenex -Solu-Medrol 80 mg IV BID -> transition to PO prednisone per day team  -IV Azithromycin  -sputum culture -respiratory panel -treated for CAP with rocpehin, but CXR does not show a focal consolidation, she has no increased WBC and no fevers.  Will follow clinically and check procalcitonin, but will hold rocephin for now.  -mucinex -IS to bedside -smoking cessation encouraged  -gentle IVF     Acute respiratory failure with hypoxemia (HCC) Secondary to above.     Hypokalemia -check magnesium, replaced in ED -follow tomorrow  -continue home potassium     Elevated troponin -Initial troponin 23 delta pending -No ST changes on EKG and not endorsing chest pain -Likely demand ischemia in setting of COPD exacerbation with acute respiratory failure with hypoxia. -telemetry, trend troponin     Thrombocytopenia (HCC) -Possibly secondary to infection versus Eliquis -Continue to monitor    AF (paroxysmal atrial fibrillation) (HCC) -Normal sinus rhythm, placed on telemetry -Continue Eliquis and Multaq and metoprolol -cardioverted per family in 4/22.     CAD (coronary artery disease) -Status post coronary artery bypass graft in 2021.  Records not available as she recently moved from [place], [place] has established care with cardiology here in [place], Dr. Jens Som -Not endorsing chest pain continue aspirin, statin and beta-blocker. (Apparently is no longer on crestor)     Essential hypertension -Blood pressure has been elevated we will continue her home metoprolol     Hyperlipidemia Has stopped crestor.   Anxiety On xanax and provider has cut down to once/day. Appears she is still taking twice a day, but will do once/day while in hospital. Pmp verified and read office notes.   ? Alcohol abuse Per telephone note with cardiology is drinking 3-4 beers/day. Sometimes a couple of bourbons.  She states she has not had a drink since Friday, no hx of withdrawal -checking ethanol level  -CIWA protocol but avoiding ativan with respiratory failure and on daily xanax -start MV, thiamine, folic acid  -MCV 104, check b12/folic acid.    Body mass index is 23.13 kg/m.   Level of care:  DVT prophylaxis:  eliquis  Code  Status:  Full - confirmed with patient Family Communication: Monica Zamora present at bedside  Disposition Plan:  The patient is from: home  Anticipated d/c is to: home   Placed in observation anticipate less than 2 midnight stay requires hospitalization for oxygen due to respiratory failure from COPD exacerbation IV fluids IV medications and constant monitoring.    Patient is currently: stable  Consults called: none  Admission status:  observation   Dragon dictation used  in completing this note.   Orland Mustard MD [facility]   Zamora to contact the [facility] Attending or Consulting provider 7A - 7P or covering provider during after hours 7P -7A, for this patient?  Check the care team in [facility] and look for a) attending/consulting [facility] provider listed and b) the [facility] team listed Log into [email or web address] and use [place] universal password to access. If you do not have the password, please contact the hospital operator. Locate the [facility] provider you are looking for under [facility] and page to a number that you can be directly reached. If you still have difficulty reaching the provider, please page the [facility] (Director on Call) for the Hospitalists listed on amion for assistance.   08/08/2021, 5:46 PM

## 2021-08-08 NOTE — ED Provider Notes (Signed)
[facility] EMERGENCY DEPARTMENT Provider Note   CSN: [id number] Arrival date & time: 08/08/21  1520     History Chief Complaint  Patient presents with   Shortness of Breath    Monica Zamora is a 70 y.o. female.   Shortness of Breath Associated symptoms: chest pain   Associated symptoms: no abdominal pain and no rash   Patient with shortness of breath and cough.  Has had for the last 4 days.  Some dull chest pain.  Coughing with some mild sputum production.  Son had a cough but did not have any fevers or chills with it.  History of COPD.  Not on oxygen.  Also history of A. fib.  Upon arrival had saturations of 83% on room air.  Much improved on nasal cannula oxygen.  No swelling of her legs.    Past Medical History:  Diagnosis Date   A-fib [facility])    COPD (chronic obstructive pulmonary disease) (HCC)    Coronary artery disease    Hx of CABG    Hypertension    PUD (peptic ulcer disease)     There are no problems to display for this patient.   Past Surgical History:  Procedure Laterality Date   ABDOMINAL HYSTERECTOMY     CHOLECYSTECTOMY     CORONARY ARTERY BYPASS GRAFT     ruptured disc     SPINE SURGERY       OB History   No obstetric history on file.     Family History  Problem Relation Age of Onset   Lupus Mother     Social History   Tobacco Use   Smoking status: Every Day    Packs/day: 0.25    Types: Cigarettes   Smokeless tobacco: Never  Substance Use Topics   Alcohol use: Yes    Comment: Occasional   Drug use: Never    Home Medications Prior to Admission medications   Medication Sig Start Date End Date Taking? Authorizing Provider  ALPRAZolam Prudy Feeler) 0.5 MG tablet Take 1 tablet (0.5 mg total) by mouth at bedtime as needed for anxiety. 07/29/21   Georganna Skeans, MD  apixaban (ELIQUIS) 5 MG TABS tablet Take 5 mg by mouth 2 (two) times daily.    [provider]  aspirin 81 MG chewable tablet Chew by mouth daily.     [provider]  dronedarone (MULTAQ) 400 MG tablet Take 400 mg by mouth 2 (two) times daily with a meal. Takes 1/2 po daily    [provider]  furosemide (LASIX) 40 MG tablet Take 1 tablet (40 mg total) by mouth daily. 06/24/21   Georganna Skeans, MD  metoprolol succinate (TOPROL-XL) 25 MG 24 hr tablet Take 25 mg by mouth daily.    [provider]  potassium chloride (KLOR-CON) 20 MEQ packet Take by mouth daily.    [provider]  rosuvastatin (CRESTOR) 10 MG tablet Take 1 tablet (10 mg total) by mouth daily. 07/12/21 10/10/21  Lewayne Bunting, MD  venlafaxine XR (EFFEXOR XR) 37.5 MG 24 hr capsule Take 1 capsule (37.5 mg total) by mouth daily with breakfast. 05/06/21   Georganna Skeans, MD    Allergies    Patient has no known allergies.  Review of Systems   Review of Systems  Constitutional:  Negative for appetite change.  HENT:  Positive for congestion.   Respiratory:  Positive for shortness of breath.   Cardiovascular:  Positive for chest pain.  Gastrointestinal:  Negative for  abdominal pain.  Genitourinary:  Negative for enuresis.  Musculoskeletal:  Negative for back pain.  Skin:  Negative for rash.  Neurological:  Negative for weakness.  Psychiatric/Behavioral:  Negative for confusion.    Physical Exam Updated Vital Signs BP (!) 171/70   Pulse 84   Temp 99.9 F (37.7 C) (Oral)   Resp (!) 31   Ht 5\' 6"  (1.676 m)   Wt 65 kg   SpO2 100%   BMI 23.13 kg/m   Physical Exam Vitals and nursing note reviewed.  HENT:     Head: Atraumatic.  Cardiovascular:     Rate and Rhythm: Regular rhythm.  Pulmonary:     Comments: Harsh breath sounds with wheezes.  Tachypnea Abdominal:     Tenderness: There is no abdominal tenderness.  Musculoskeletal:     Cervical back: Neck supple.     Right lower leg: No edema.     Left lower leg: No edema.  Skin:    General: Skin is warm.     Capillary Refill: Capillary refill takes less than 2 seconds.   Neurological:     Mental Status: She is alert and oriented to person, place, and time.    ED Results / Procedures / Treatments   Labs (all labs ordered are listed, but only abnormal results are displayed) Labs Reviewed  RESP PANEL BY RT-PCR (FLU A&B, COVID) [id number]  COMPREHENSIVE METABOLIC PANEL  BRAIN NATRIURETIC PEPTIDE  CBC WITH DIFFERENTIAL/PLATELET  TROPONIN I (HIGH SENSITIVITY)    EKG EKG Interpretation  Date/Time:  Thursday August 08 2021 15:30:26 EST Ventricular Rate:  91 PR Interval:  175 QRS Duration: 90 QT Interval:  364 QTC Calculation: 448 R Axis:   77 Text Interpretation: Sinus rhythm Borderline repolarization abnormality No old tracing to compare Confirmed by 12-23-1979 [phone]) on 08/08/2021 3:36:47 PM  Radiology No results found.  Procedures Procedures   Medications Ordered in ED Medications - No data to display  ED Course  I have reviewed the triage vital signs and the nursing notes.  Pertinent labs & imaging results that were available during my care of the patient were reviewed by me and considered in my medical decision making (see chart for details).    MDM Rules/Calculators/A&P                           Patient with shortness of breath.  Cough.  Mild sputum production.  Hypoxic on room air.  COVID and flu testing pending.  Chest x-ray pending.  Lab work pending.  With oxygen requirement will likely require admission to hospital.  Care turned over to oncoming provider Dr. 08/10/2021. Final Clinical Impression(s) / ED Diagnoses Final diagnoses:  None    Rx / DC Orders ED Discharge Orders     None        Criss Alvine, MD 08/08/21 [phone]

## 2021-08-08 NOTE — ED Notes (Signed)
EKG given to Dr. Goldston  

## 2021-08-09 DIAGNOSIS — D696 Thrombocytopenia, unspecified: Secondary | ICD-10-CM

## 2021-08-09 DIAGNOSIS — J449 Chronic obstructive pulmonary disease, unspecified: Secondary | ICD-10-CM | POA: Diagnosis present

## 2021-08-09 DIAGNOSIS — J9811 Atelectasis: Secondary | ICD-10-CM | POA: Diagnosis present

## 2021-08-09 DIAGNOSIS — I480 Paroxysmal atrial fibrillation: Secondary | ICD-10-CM

## 2021-08-09 DIAGNOSIS — R778 Other specified abnormalities of plasma proteins: Secondary | ICD-10-CM | POA: Diagnosis present

## 2021-08-09 DIAGNOSIS — Z7901 Long term (current) use of anticoagulants: Secondary | ICD-10-CM | POA: Diagnosis not present

## 2021-08-09 DIAGNOSIS — I2510 Atherosclerotic heart disease of native coronary artery without angina pectoris: Secondary | ICD-10-CM

## 2021-08-09 DIAGNOSIS — I10 Essential (primary) hypertension: Secondary | ICD-10-CM | POA: Diagnosis present

## 2021-08-09 DIAGNOSIS — F17210 Nicotine dependence, cigarettes, uncomplicated: Secondary | ICD-10-CM | POA: Diagnosis present

## 2021-08-09 DIAGNOSIS — J441 Chronic obstructive pulmonary disease with (acute) exacerbation: Secondary | ICD-10-CM | POA: Diagnosis present

## 2021-08-09 DIAGNOSIS — J9601 Acute respiratory failure with hypoxia: Secondary | ICD-10-CM | POA: Diagnosis present

## 2021-08-09 DIAGNOSIS — Z832 Family history of diseases of the blood and blood-forming organs and certain disorders involving the immune mechanism: Secondary | ICD-10-CM | POA: Diagnosis not present

## 2021-08-09 DIAGNOSIS — E785 Hyperlipidemia, unspecified: Secondary | ICD-10-CM

## 2021-08-09 DIAGNOSIS — Z8711 Personal history of peptic ulcer disease: Secondary | ICD-10-CM | POA: Diagnosis not present

## 2021-08-09 DIAGNOSIS — Z7982 Long term (current) use of aspirin: Secondary | ICD-10-CM | POA: Diagnosis not present

## 2021-08-09 DIAGNOSIS — F1010 Alcohol abuse, uncomplicated: Secondary | ICD-10-CM | POA: Diagnosis present

## 2021-08-09 DIAGNOSIS — Z951 Presence of aortocoronary bypass graft: Secondary | ICD-10-CM | POA: Diagnosis not present

## 2021-08-09 DIAGNOSIS — Z20822 Contact with and (suspected) exposure to covid-19: Secondary | ICD-10-CM | POA: Diagnosis present

## 2021-08-09 DIAGNOSIS — B974 Respiratory syncytial virus as the cause of diseases classified elsewhere: Secondary | ICD-10-CM | POA: Diagnosis present

## 2021-08-09 DIAGNOSIS — F419 Anxiety disorder, unspecified: Secondary | ICD-10-CM | POA: Diagnosis present

## 2021-08-09 DIAGNOSIS — I6529 Occlusion and stenosis of unspecified carotid artery: Secondary | ICD-10-CM | POA: Diagnosis present

## 2021-08-09 DIAGNOSIS — D6959 Other secondary thrombocytopenia: Secondary | ICD-10-CM | POA: Diagnosis present

## 2021-08-09 DIAGNOSIS — Z79899 Other long term (current) drug therapy: Secondary | ICD-10-CM | POA: Diagnosis not present

## 2021-08-09 DIAGNOSIS — Z23 Encounter for immunization: Secondary | ICD-10-CM | POA: Diagnosis present

## 2021-08-09 DIAGNOSIS — I4891 Unspecified atrial fibrillation: Secondary | ICD-10-CM | POA: Diagnosis not present

## 2021-08-09 DIAGNOSIS — Z90710 Acquired absence of both cervix and uterus: Secondary | ICD-10-CM | POA: Diagnosis not present

## 2021-08-09 DIAGNOSIS — E876 Hypokalemia: Secondary | ICD-10-CM | POA: Diagnosis present

## 2021-08-09 LAB — CBC
HCT: 33.8 % — ABNORMAL LOW (ref 36.0–46.0)
Hemoglobin: 11.3 g/dL — ABNORMAL LOW (ref 12.0–15.0)
MCH: 35.1 pg — ABNORMAL HIGH (ref 26.0–34.0)
MCHC: 33.4 g/dL (ref 30.0–36.0)
MCV: 105 fL — ABNORMAL HIGH (ref 80.0–100.0)
Platelets: 124 10*3/uL — ABNORMAL LOW (ref 150–400)
RBC: 3.22 MIL/uL — ABNORMAL LOW (ref 3.87–5.11)
RDW: 12.5 % (ref 11.5–15.5)
WBC: 3.3 10*3/uL — ABNORMAL LOW (ref 4.0–10.5)
nRBC: 0 % (ref 0.0–0.2)

## 2021-08-09 LAB — RESPIRATORY PANEL BY PCR

## 2021-08-09 LAB — BASIC METABOLIC PANEL
Anion gap: 7 (ref 5–15)
BUN: 14 mg/dL (ref 8–23)
CO2: 26 mmol/L (ref 22–32)
Calcium: 8.6 mg/dL — ABNORMAL LOW (ref 8.9–10.3)
Chloride: 102 mmol/L (ref 98–111)
Creatinine, Ser: 1.03 mg/dL — ABNORMAL HIGH (ref 0.44–1.00)
GFR, Estimated: 59 mL/min — ABNORMAL LOW (ref >60)
Glucose, Bld: 157 mg/dL — ABNORMAL HIGH (ref 70–99)
Potassium: 3.9 mmol/L (ref 3.5–5.1)
Sodium: 135 mmol/L (ref 135–145)

## 2021-08-09 LAB — LIPID PANEL
Cholesterol: 166 mg/dL (ref 0–200)
HDL: 68 mg/dL (ref >40)
LDL Cholesterol: 86 mg/dL (ref 0–99)
Total CHOL/HDL Ratio: 2.4 RATIO
Triglycerides: 59 mg/dL (ref <150)
VLDL: 12 mg/dL (ref 0–40)

## 2021-08-09 LAB — PROCALCITONIN: Procalcitonin: <0.1 ng/mL

## 2021-08-09 MED ORDER — AMLODIPINE BESYLATE 5 MG PO TABS
5.0000 mg | ORAL_TABLET | Freq: Every day | ORAL | Status: DC
  Administered 2021-08-10 – 2021-08-11 (×2): 5 mg via ORAL
  Filled 2021-08-09 (×2): qty 1

## 2021-08-09 MED ORDER — PREDNISONE 20 MG PO TABS
40.0000 mg | ORAL_TABLET | Freq: Every day | ORAL | Status: DC
  Administered 2021-08-10 – 2021-08-11 (×2): 40 mg via ORAL
  Filled 2021-08-09 (×3): qty 2

## 2021-08-09 MED ORDER — IPRATROPIUM-ALBUTEROL 0.5-2.5 (3) MG/3ML IN SOLN: 3.0000 mL | Freq: Four times a day (QID) | RESPIRATORY_TRACT | Status: DC

## 2021-08-09 MED ORDER — IPRATROPIUM-ALBUTEROL 0.5-2.5 (3) MG/3ML IN SOLN
3.0000 mL | Freq: Four times a day (QID) | RESPIRATORY_TRACT | Status: DC
  Administered 2021-08-09 – 2021-08-10 (×5): 3 mL via RESPIRATORY_TRACT
  Filled 2021-08-09 (×5): qty 3

## 2021-08-09 MED ORDER — FUROSEMIDE 10 MG/ML IJ SOLN
40.0000 mg | Freq: Once | INTRAMUSCULAR | Status: AC
  Administered 2021-08-09: 40 mg via INTRAVENOUS
  Filled 2021-08-09: qty 4

## 2021-08-09 MED ORDER — LEVALBUTEROL HCL 0.63 MG/3ML IN NEBU: 0.6300 mg | INHALATION_SOLUTION | RESPIRATORY_TRACT | Status: DC | PRN

## 2021-08-09 MED ORDER — ALPRAZOLAM 0.5 MG PO TABS
0.5000 mg | ORAL_TABLET | Freq: Every evening | ORAL | Status: DC | PRN
  Administered 2021-08-09 – 2021-08-11 (×3): 0.5 mg via ORAL
  Filled 2021-08-09 (×3): qty 1

## 2021-08-09 MED ORDER — PRAVASTATIN SODIUM 10 MG PO TABS
20.0000 mg | ORAL_TABLET | Freq: Every day | ORAL | Status: DC
  Administered 2021-08-09 – 2021-08-11 (×3): 20 mg via ORAL
  Filled 2021-08-09 (×3): qty 2

## 2021-08-09 MED ORDER — IPRATROPIUM-ALBUTEROL 0.5-2.5 (3) MG/3ML IN SOLN
3.0000 mL | Freq: Two times a day (BID) | RESPIRATORY_TRACT | Status: DC
  Administered 2021-08-09: 3 mL via RESPIRATORY_TRACT
  Filled 2021-08-09: qty 3

## 2021-08-09 MED ORDER — AZITHROMYCIN 250 MG PO TABS
500.0000 mg | ORAL_TABLET | ORAL | Status: AC
  Administered 2021-08-09 – 2021-08-10 (×2): 500 mg via ORAL
  Filled 2021-08-09 (×4): qty 2

## 2021-08-09 NOTE — Progress Notes (Signed)
PROGRESS NOTE    Monica Zamora  [id number] DOB: May 25, 1951 DOA: 08/08/2021 PCP: Dorna Mai, MD   Chief Complaint  Patient presents with   Shortness of Breath    Brief Narrative: Monica Zamora is a 70 y.o. female with medical history significant of atrial fibrillation, COPD, CAD with hx of CABG, HTN, who presented to ED with  worsening shortness of breath and cough. Symptoms started "Sunday night with chills and fatigue. Tuesday night she started to get a cough, congestion. On Wednesday she had worsening cough and shortness of breath. Cough has been productive with increased sputum. It's thicker and green.   SUBJECTIVE: Overnight, no acute events. Reports that she feels much better but continues to have cough, finds benefit with breathing treatments.   Assessment & Plan:   Principal Problem:   COPD with acute exacerbation (HCC) Active Problems:   AF (paroxysmal atrial fibrillation) (HCC)   Hypokalemia   CAD (coronary artery disease)   Essential hypertension   Hyperlipidemia   Elevated troponin   Acute respiratory failure with hypoxemia (HCC)   Thrombocytopenia (HCC)   COPD exacerbation (HCC)  [name] is a 70 yo w/PMHx of COPD, pAF who presents w/dyspnea, productive cough, and wheezing c/w COPD exacerbation.  #COPD exacerbation #Acute respiratory failure Presented w/productive cough, wheezing, and dyspnea. Requiring 2L+ of O2. Continues to have mild wheezing on exam. - azithro - prednisone day 2/5 - Duo-nebs q6h + levalbuterol q2hprn (gets jittery w/albuterol) - wean supp O2 as able - can spot dose diuretic given rales on exam  #Paroxysmal AF CHADSVASc >2 given age, HTN etc - c/w dronedarone - c/w metoprolol 12.5mg BID - c/w apixaban 5mg BID  #CAD s/p CABG #Carotid artery stenosis - resume statin, pravastatin given challenges w/atorva - outpatient f/u with repeat dopplers  #HTN BPs not well controlled this admission. Potentially  elevated in setting of stress - c/w metop, furosemide - will start amlodipine  #HLD - retrialing statin  #Thrombocytopenia Unclear etiology, no evidence of bleeding - HIV Non reactive - Hep C pending  #hx of tobacco abuse - patient quit prior to admission, will likely need cessation meds on discharge  DVT prophylaxis: apixaban Code Status:  full code Disposition:  Status is: Inpatient Remains inpatient appropriate because: medical necessity   Consultants:  N/a  Procedures:  N/a  Antimicrobials:  Anti-infectives (From admission, onward)    Start     Dose/Rate Route Frequency Ordered Stop   08/09/21 1800  azithromycin (ZITHROMAX) 500 mg in sodium chloride 0.9 % 250 mL IVPB  Status:  Discontinued        500 mg 250 mL/hr over 60 Minutes Intravenous Every 24 hours 08/08/21 1832 08/09/21 0916   08/09/21 1800  azithromycin (ZITHROMAX) tablet 500 mg        500 mg Oral Every 24 hours 08/09/21 0916 08/11/21 1759   08/08/21 1730  cefTRIAXone (ROCEPHIN) 1 g in sodium chloride 0.9 % 100 mL IVPB        1 g 200 mL/hr over 30 Minutes Intravenous  Once 08/08/21 1722 08/08/21 1813   08/08/21 1730  azithromycin (ZITHROMAX) 500 mg in sodium chloride 0.9 % 250 mL IVPB        50" 0 mg 250 mL/hr over 60 Minutes Intravenous  Once 08/08/21 1722 08/08/21 1920         Objective: Vitals:   08/09/21 0500 08/09/21 0814 08/09/21 0918 08/09/21 1143  BP: (!) 110/49 (!) 140/54  (!) 157/62  Pulse: 60 63  65  Resp: 17     Temp: 98 F (36.7 C) 97.8 F (36.6 C)  98.8 F (37.1 C)  TempSrc: Oral Oral  Oral  SpO2: 90% 91% 95% 93%  Weight: 62.6 kg     Height:        Intake/Output Summary (Last 24 hours) at 08/09/2021 1608 Last data filed at 08/09/2021 1334 Gross per 24 hour  Intake 2173.72 ml  Output 500 ml  Net 1673.72 ml   Filed Weights   08/08/21 1533 08/08/21 1950 08/09/21 0500  Weight: 65 kg 62.7 kg 62.6 kg    Examination:  General exam: Appears calm and comfortable   Respiratory system: +Wheezing w/rales and coarse breath sounds, prolonged expiratory phase Cardiovascular system: S1 & S2 WNL, RRR. No JVD. No rubs, gallops. trace pedal edema. Gastrointestinal system: Abdomen is nondistended, soft and nontender. No organomegaly or masses felt. Normal bowel sounds heard. Central nervous system: Alert and oriented. No focal neurological deficits. Extremities: normal muscle tone/bulk Skin: No rashes, lesions or ulcers Psychiatry: Judgement and insight appear normal. Mood & affect appropriate.    Data Reviewed: I have personally reviewed following labs and imaging studies  CBC: Recent Labs  Lab 08/08/21 1543 08/09/21 0318  WBC 4.7 3.3*  NEUTROABS 3.8  --   HGB 13.4 11.3*  HCT 38.5 33.8*  MCV 104.1* 105.0*  PLT 125* 124*    Basic Metabolic Panel: Recent Labs  Lab 08/08/21 1543 08/08/21 2003 08/09/21 0318  NA 132*  --  135  K 3.4*  --  3.9  CL 95*  --  102  CO2 25  --  26  GLUCOSE 133*  --  157*  BUN 13  --  14  CREATININE 1.13*  --  1.03*  CALCIUM 8.6*  --  8.6*  MG  --  1.7  --     GFR: Estimated Creatinine Clearance: 48.3 mL/min (A) (by C-G formula based on SCr of 1.03 mg/dL (H)).  Liver Function Tests: Recent Labs  Lab 08/08/21 1543  AST 25  ALT 12  ALKPHOS 89  BILITOT 0.7  PROT 7.0  ALBUMIN 3.6    CBG: No results for input(s): GLUCAP in the last 168 hours.   Recent Results (from the past 240 hour(s))  Resp Panel by RT-PCR (Flu A&B, Covid) Nasopharyngeal Swab     Status: None   Collection Time: 08/08/21  3:48 PM   Specimen: Nasopharyngeal Swab; Nasopharyngeal(NP) swabs in vial transport medium  Result Value Ref Range Status   SARS Coronavirus 2 by RT PCR NEGATIVE NEGATIVE Final    Comment: (NOTE) SARS-CoV-2 target nucleic acids are NOT DETECTED.  The SARS-CoV-2 RNA is generally detectable in upper respiratory specimens during the acute phase of infection. The lowest concentration of SARS-CoV-2 viral copies this  assay can detect is 138 copies/mL. A negative result does not preclude SARS-Cov-2 infection and should not be used as the sole basis for treatment or other patient management decisions. A negative result may occur with  improper specimen collection/handling, submission of specimen other than nasopharyngeal swab, presence of viral mutation(s) within the areas targeted by this assay, and inadequate number of viral copies(<138 copies/mL). A negative result must be combined with clinical observations, patient history, and epidemiological information. The expected result is Negative.  Fact Sheet for Patients:  EntrepreneurPulse.com.au  Fact Sheet for Healthcare Providers:  IncredibleEmployment.be  This test is no t yet approved or cleared by the [place] FDA and  has been authorized for detection and/or  diagnosis of SARS-CoV-2 by FDA under an Emergency Use Authorization (EUA). This EUA will remain  in effect (meaning this test can be used) for the duration of the COVID-19 declaration under Section 564(b)(1) of the Act, 21 U.S.C.section 360bbb-3(b)(1), unless the authorization is terminated  or revoked sooner.       Influenza A by PCR NEGATIVE NEGATIVE Final   Influenza B by PCR NEGATIVE NEGATIVE Final    Comment: (NOTE) The Xpert Xpress SARS-CoV-2/FLU/RSV plus assay is intended as an aid in the diagnosis of influenza from Nasopharyngeal swab specimens and should not be used as a sole basis for treatment. Nasal washings and aspirates are unacceptable for Xpert Xpress SARS-CoV-2/FLU/RSV testing.  Fact Sheet for Patients: BloggerCourse.com  Fact Sheet for Healthcare Providers: SeriousBroker.it  This test is not yet approved or cleared by the [place] FDA and has been authorized for detection and/or diagnosis of SARS-CoV-2 by FDA under an Emergency Use Authorization (EUA). This EUA will  remain in effect (meaning this test can be used) for the duration of the COVID-19 declaration under Section 564(b)(1) of the Act, 21 U.S.C. section 360bbb-3(b)(1), unless the authorization is terminated or revoked.  Performed at [facility], [address], [place], [place] [address]   Culture, blood (routine x 2)     Status: None (Preliminary result)   Collection Time: 08/08/21  5:49 PM   Specimen: BLOOD RIGHT FOREARM  Result Value Ref Range Status   Specimen Description BLOOD RIGHT FOREARM  Final   Special Requests   Final    BOTTLES DRAWN AEROBIC AND ANAEROBIC Blood Culture results may not be optimal due to an inadequate volume of blood received in culture bottles   Culture   Final    NO GROWTH < 24 HOURS Performed at [facility], [address] [place], [place] [address]    Report Status PENDING  Incomplete  Culture, blood (routine x 2)     Status: None (Preliminary result)   Collection Time: 08/08/21  5:52 PM   Specimen: BLOOD LEFT FOREARM  Result Value Ref Range Status   Specimen Description BLOOD LEFT FOREARM  Final   Special Requests   Final    BOTTLES DRAWN AEROBIC AND ANAEROBIC Blood Culture results may not be optimal due to an inadequate volume of blood received in culture bottles   Culture   Final    NO GROWTH < 24 HOURS Performed at [facility], [address], [place], [place] [address]    Report Status PENDING  Incomplete      Radiology Studies: DG Chest Portable 1 View  Result Date: 08/08/2021 CLINICAL DATA:  70 year old female with shortness of breath and chest pain EXAM: PORTABLE CHEST 1 VIEW COMPARISON:  None. FINDINGS: Cardiomegaly and median sternotomy noted. RIGHT basilar atelectasis/scarring noted. There is no evidence of focal airspace disease, pulmonary edema, suspicious pulmonary nodule/mass, pleural effusion, or pneumothorax. No acute bony abnormalities are identified. IMPRESSION: Cardiomegaly with RIGHT basilar  atelectasis/scarring. Electronically Signed   By: Harmon Pier M.D.   On: 08/08/2021 16:05        Scheduled Meds:  ALPRAZolam  0.5 mg Oral Daily   apixaban  5 mg Oral BID   aspirin  81 mg Oral Daily   azithromycin  500 mg Oral Q24H   dronedarone  200 mg Oral BID   folic acid  1 mg Oral Daily   furosemide  40 mg Oral Daily   guaiFENesin  600 mg Oral BID  metoprolol tartrate  12.5 mg Oral BID   multivitamin with minerals  1 tablet Oral Daily   nicotine  14 mg Transdermal Daily   potassium chloride SA  20 mEq Oral Daily   [START ON 08/10/2021] predniSONE  40 mg Oral Q breakfast   thiamine  100 mg Oral Daily   Or   thiamine  100 mg Intravenous Daily   Continuous Infusions:   LOS: 0 days    Cecille Rubin, MD [facility]   To contact the attending provider between 7A-7P or the covering provider during after hours 7P-7A, please log into the web site [email or web address] and access using universal [place] password for that web site. If you do not have the password, please call the hospital operator.  08/09/2021, 4:08 PM

## 2021-08-10 ENCOUNTER — Encounter (HOSPITAL_COMMUNITY): Payer: Self-pay | Admitting: Internal Medicine

## 2021-08-10 ENCOUNTER — Other Ambulatory Visit: Payer: Self-pay

## 2021-08-10 LAB — PROCALCITONIN: Procalcitonin: <0.1 ng/mL

## 2021-08-10 LAB — HEPATITIS C ANTIBODY: HCV Ab: NONREACTIVE

## 2021-08-10 MED ORDER — INFLUENZA VAC A&B SA ADJ QUAD 0.5 ML IM PRSY
0.5000 mL | PREFILLED_SYRINGE | INTRAMUSCULAR | Status: AC
  Administered 2021-08-11: 10:00:00 0.5 mL via INTRAMUSCULAR
  Filled 2021-08-10: qty 0.5

## 2021-08-10 MED ORDER — PNEUMOCOCCAL VAC POLYVALENT 25 MCG/0.5ML IJ INJ
0.5000 mL | INJECTION | INTRAMUSCULAR | Status: AC
  Administered 2021-08-11: 10:00:00 0.5 mL via INTRAMUSCULAR
  Filled 2021-08-10: qty 0.5

## 2021-08-10 MED ORDER — SALINE SPRAY 0.65 % NA SOLN
1.0000 | NASAL | Status: DC | PRN
  Filled 2021-08-10: qty 44

## 2021-08-10 MED ORDER — IPRATROPIUM-ALBUTEROL 0.5-2.5 (3) MG/3ML IN SOLN
3.0000 mL | Freq: Three times a day (TID) | RESPIRATORY_TRACT | Status: DC
Start: 2021-08-11 — End: 2021-08-11
  Administered 2021-08-11: 08:00:00 3 mL via RESPIRATORY_TRACT
  Filled 2021-08-10: qty 3

## 2021-08-10 MED ORDER — MAGNESIUM SULFATE 2 GM/50ML IV SOLN
2.0000 g | Freq: Once | INTRAVENOUS | Status: AC
  Administered 2021-08-10: 2 g via INTRAVENOUS
  Filled 2021-08-10: qty 50

## 2021-08-10 NOTE — Progress Notes (Signed)
PROGRESS NOTE    Monica Zamora  [id number] DOB: 1951/03/14 DOA: 08/08/2021 PCP: Dorna Mai, MD   Chief Complaint  Patient presents with   Shortness of Breath    Brief Narrative: Monica Zamora is a 70 y.o. female with medical history significant of atrial fibrillation, COPD, CAD with hx of CABG, HTN, who presented to ED with  worsening shortness of breath and cough. Symptoms started "Sunday night with chills and fatigue. Tuesday night she started to get a cough, congestion. On Wednesday she had worsening cough and shortness of breath. Cough has been productive with increased sputum. It's thicker and green.   SUBJECTIVE: 08/10/2021: Patient was seen and examined at her bedside.  She reports not on oxygen supplementation at baseline.  Currently requiring 2 L to maintain O2 saturation greater than 92%.  Endorses minimal sleep last night due to noises.  Assessment & Plan:   Principal Problem:   COPD with acute exacerbation (HCC) Active Problems:   AF (paroxysmal atrial fibrillation) (HCC)   Hypokalemia   CAD (coronary artery disease)   Essential hypertension   Hyperlipidemia   Elevated troponin   Acute respiratory failure with hypoxemia (HCC)   Thrombocytopenia (HCC)   COPD exacerbation (HCC)  Monica Zamora is a 70 yo w/PMHx of COPD, pAF who presents w/dyspnea, productive cough, and wheezing c/w COPD exacerbation.  #COPD exacerbation #Acute hypoxic respiratory failure secondary to COPD exacerbation Presented w/productive cough, wheezing, and dyspnea.  Not on oxygen supplementation at baseline. Currently requiring 2 L to maintain O2 saturation greater than 92%. Continue azithromycin Continue prednisone day 3 out of 5. Continue bronchodilators Maintain O2 saturation greater than 92% Home oxygen evaluation for DC planning.  #Paroxysmal AF CHADSVASc >2 given age, HTN etc - c/w dronedarone - c/w metoprolol 12.5mg BID - c/w apixaban 5mg BID 2D echo is  pending  #CAD s/p CABG #Carotid artery stenosis Continue statin, pravastatin given challenges w/atorva - outpatient f/u with repeat dopplers  #HTN BP is now stable. Continue home metop, furosemide Started amlodipine  #HLD Continue statin  #Thrombocytopenia Unclear etiology, no evidence of bleeding - HIV Non reactive - Hep C pending  #hx of tobacco abuse - patient quit prior to admission, will likely need cessation meds on discharge  DVT prophylaxis: apixaban Code Status:  full code Disposition:  Status is: Inpatient Remains inpatient appropriate because: medical necessity   Consultants:  N/a  Procedures:  N/a  Antimicrobials:  Anti-infectives (From admission, onward)    Start     Dose/Rate Route Frequency Ordered Stop   08/09/21 1800  azithromycin (ZITHROMAX) 500 mg in sodium chloride 0.9 % 250 mL IVPB  Status:  Discontinued        500 mg 250 mL/hr over 60 Minutes Intravenous Every 24 hours 08/08/21 1832 08/09/21 0916   08/09/21 1800  azithromycin (ZITHROMAX) tablet 500 mg        500 mg Oral Every 24 hours 08/09/21 0916 08/11/21 1759   08/08/21 1730  cefTRIAXone (ROCEPHIN) 1 g in sodium chloride 0.9 % 100 mL IVPB        1 g 200 mL/hr over 30 Minutes Intravenous  Once 08/08/21 1722 08/08/21 1813   08/08/21 1730  azithromycin (ZITHROMAX) 500 mg in sodium chloride 0.9 % 250 mL IVPB        50" 0 mg 250 mL/hr over 60 Minutes Intravenous  Once 08/08/21 1722 08/08/21 1920         Objective: Vitals:   08/10/21 0351 08/10/21 0817 08/10/21 0839 08/10/21  1155  BP: (!) 126/49  (!) 122/51 (!) 137/46  Pulse: 69  66 (!) 56  Resp: 20     Temp: 99.1 F (37.3 C)  97.7 F (36.5 C)   TempSrc: Oral  Oral   SpO2: 91% 94% 95% 97%  Weight: 61.1 kg     Height:        Intake/Output Summary (Last 24 hours) at 08/10/2021 1242 Last data filed at 08/10/2021 0837 Gross per 24 hour  Intake 600 ml  Output 1300 ml  Net -700 ml   Filed Weights   08/08/21 1950 08/09/21 0500  08/10/21 0351  Weight: 62.7 kg 62.6 kg 61.1 kg    Examination:  General exam: Well-developed well-nourished in no acute distress.  She is alert and oriented x3. Respiratory system: Mild wheezing noted bilaterally.  Mild rales at bases.  Good inspiratory effort.   Cardiovascular system: Regular rate and rhythm no rubs or gallops.  No JVD or thyromegaly noted.   Gastrointestinal system: Nondistended normal bowel sounds present.   Central nervous system: Alert and oriented x3.   Extremities: Trace lower extremity edema bilaterally. Skin: No rashes lesions or ulcers. Psychiatry: Mood is appropriate for condition and setting.  Data Reviewed: I have personally reviewed following labs and imaging studies  CBC: Recent Labs  Lab 08/08/21 1543 08/09/21 0318  WBC 4.7 3.3*  NEUTROABS 3.8  --   HGB 13.4 11.3*  HCT 38.5 33.8*  MCV 104.1* 105.0*  PLT 125* 124*    Basic Metabolic Panel: Recent Labs  Lab 08/08/21 1543 08/08/21 2003 08/09/21 0318  NA 132*  --  135  K 3.4*  --  3.9  CL 95*  --  102  CO2 25  --  26  GLUCOSE 133*  --  157*  BUN 13  --  14  CREATININE 1.13*  --  1.03*  CALCIUM 8.6*  --  8.6*  MG  --  1.7  --     GFR: Estimated Creatinine Clearance: 48.3 mL/min (A) (by C-G formula based on SCr of 1.03 mg/dL (H)).  Liver Function Tests: Recent Labs  Lab 08/08/21 1543  AST 25  ALT 12  ALKPHOS 89  BILITOT 0.7  PROT 7.0  ALBUMIN 3.6    CBG: No results for input(s): GLUCAP in the last 168 hours.   Recent Results (from the past 240 hour(s))  Resp Panel by RT-PCR (Flu A&B, Covid) Nasopharyngeal Swab     Status: None   Collection Time: 08/08/21  3:48 PM   Specimen: Nasopharyngeal Swab; Nasopharyngeal(NP) swabs in vial transport medium  Result Value Ref Range Status   SARS Coronavirus 2 by RT PCR NEGATIVE NEGATIVE Final    Comment: (NOTE) SARS-CoV-2 target nucleic acids are NOT DETECTED.  The SARS-CoV-2 RNA is generally detectable in upper  respiratory specimens during the acute phase of infection. The lowest concentration of SARS-CoV-2 viral copies this assay can detect is 138 copies/mL. A negative result does not preclude SARS-Cov-2 infection and should not be used as the sole basis for treatment or other patient management decisions. A negative result may occur with  improper specimen collection/handling, submission of specimen other than nasopharyngeal swab, presence of viral mutation(s) within the areas targeted by this assay, and inadequate number of viral copies(<138 copies/mL). A negative result must be combined with clinical observations, patient history, and epidemiological information. The expected result is Negative.  Fact Sheet for Patients:  EntrepreneurPulse.com.au  Fact Sheet for Healthcare Providers:  IncredibleEmployment.be  This test is  no t yet approved or cleared by the [place] and  has been authorized for detection and/or diagnosis of SARS-CoV-2 by FDA under an Emergency Use Authorization (EUA). This EUA will remain  in effect (meaning this test can be used) for the duration of the COVID-19 declaration under Section 564(b)(1) of the Act, 21 U.S.C.section 360bbb-3(b)(1), unless the authorization is terminated  or revoked sooner.       Influenza A by PCR NEGATIVE NEGATIVE Final   Influenza B by PCR NEGATIVE NEGATIVE Final    Comment: (NOTE) The Xpert Xpress SARS-CoV-2/FLU/RSV plus assay is intended as an aid in the diagnosis of influenza from Nasopharyngeal swab specimens and should not be used as a sole basis for treatment. Nasal washings and aspirates are unacceptable for Xpert Xpress SARS-CoV-2/FLU/RSV testing.  Fact Sheet for Patients: BloggerCourse.com  Fact Sheet for Healthcare Providers: SeriousBroker.it  This test is not yet approved or cleared by the [place] FDA and has been  authorized for detection and/or diagnosis of SARS-CoV-2 by FDA under an Emergency Use Authorization (EUA). This EUA will remain in effect (meaning this test can be used) for the duration of the COVID-19 declaration under Section 564(b)(1) of the Act, 21 U.S.C. section 360bbb-3(b)(1), unless the authorization is terminated or revoked.  Performed at [facility], [address] [place], [place] [address]   Respiratory (~20 pathogens) panel by PCR     Status: Abnormal   Collection Time: 08/08/21  3:48 PM   Specimen: Nasopharyngeal Swab; Respiratory  Result Value Ref Range Status   Adenovirus NOT DETECTED NOT DETECTED Final   Coronavirus 229E NOT DETECTED NOT DETECTED Final    Comment: (NOTE) The Coronavirus on the Respiratory Panel, DOES NOT test for the novel  Coronavirus (2019 nCoV)    Coronavirus HKU1 NOT DETECTED NOT DETECTED Final   Coronavirus NL63 NOT DETECTED NOT DETECTED Final   Coronavirus OC43 NOT DETECTED NOT DETECTED Final   Metapneumovirus NOT DETECTED NOT DETECTED Final   Rhinovirus / Enterovirus NOT DETECTED NOT DETECTED Final   Influenza A NOT DETECTED NOT DETECTED Final   Influenza B NOT DETECTED NOT DETECTED Final   Parainfluenza Virus 1 NOT DETECTED NOT DETECTED Final   Parainfluenza Virus 2 NOT DETECTED NOT DETECTED Final   Parainfluenza Virus 3 NOT DETECTED NOT DETECTED Final   Parainfluenza Virus 4 NOT DETECTED NOT DETECTED Final   Respiratory Syncytial Virus DETECTED (A) NOT DETECTED Final   Bordetella pertussis NOT DETECTED NOT DETECTED Final   Bordetella Parapertussis NOT DETECTED NOT DETECTED Final   Chlamydophila pneumoniae NOT DETECTED NOT DETECTED Final   Mycoplasma pneumoniae NOT DETECTED NOT DETECTED Final    Comment: Performed at [facility], [address] [place], [place] [address]  Culture, blood (routine x 2)     Status: None (Preliminary result)   Collection Time: 08/08/21  5:49 PM   Specimen: BLOOD RIGHT FOREARM  Result Value  Ref Range Status   Specimen Description BLOOD RIGHT FOREARM  Final   Special Requests   Final    BOTTLES DRAWN AEROBIC AND ANAEROBIC Blood Culture results may not be optimal due to an inadequate volume of blood received in culture bottles   Culture   Final    NO GROWTH 2 DAYS Performed at [facility], [address], [place], [place] [address]    Report Status PENDING  Incomplete  Culture, blood (routine x 2)     Status: None (Preliminary result)   Collection  Time: 08/08/21  5:52 PM   Specimen: BLOOD LEFT FOREARM  Result Value Ref Range Status   Specimen Description BLOOD LEFT FOREARM  Final   Special Requests   Final    BOTTLES DRAWN AEROBIC AND ANAEROBIC Blood Culture results may not be optimal due to an inadequate volume of blood received in culture bottles   Culture   Final    NO GROWTH 2 DAYS Performed at [facility], [place] [address] [place], [place] [address]    Report Status PENDING  Incomplete      Radiology Studies: DG Chest Portable 1 View  Result Date: 08/08/2021 CLINICAL DATA:  70 year old female with shortness of breath and chest pain EXAM: PORTABLE CHEST 1 VIEW COMPARISON:  None. FINDINGS: Cardiomegaly and median sternotomy noted. RIGHT basilar atelectasis/scarring noted. There is no evidence of focal airspace disease, pulmonary edema, suspicious pulmonary nodule/mass, pleural effusion, or pneumothorax. No acute bony abnormalities are identified. IMPRESSION: Cardiomegaly with RIGHT basilar atelectasis/scarring. Electronically Signed   By: Margarette Canada M.D.   On: 08/08/2021 16:05        Scheduled Meds:  ALPRAZolam  0.5 mg Oral Daily   amLODipine  5 mg Oral Daily   apixaban  5 mg Oral BID   aspirin  81 mg Oral Daily   azithromycin  500 mg Oral Q24H   dronedarone  200 mg Oral BID   folic acid  1 mg Oral Daily   furosemide  40 mg Oral Daily   guaiFENesin  600 mg Oral BID   ipratropium-albuterol  3 mL Nebulization Q6H   metoprolol tartrate  12.5 mg  Oral BID   multivitamin with minerals  1 tablet Oral Daily   nicotine  14 mg Transdermal Daily   potassium chloride SA  20 mEq Oral Daily   pravastatin  20 mg Oral q1800   predniSONE  40 mg Oral Q breakfast   thiamine  100 mg Oral Daily   Or   thiamine  100 mg Intravenous Daily   Continuous Infusions:   LOS: 1 day    Kayleen Memos, MD [facility]   To contact the attending provider between 7A-7P or the covering provider during after hours 7P-7A, please log into the web site [email or web address] and access using universal [place] password for that web site. If you do not have the password, please call the hospital operator.  08/10/2021, 12:42 PM

## 2021-08-10 NOTE — Progress Notes (Signed)
Mobility Specialist Progress Note:   08/10/21 1307  Mobility  Activity Ambulated in hall  Level of Assistance Standby assist, set-up cues, supervision of patient - no hands on  Assistive Device None  Distance Ambulated (ft) 230 ft  Mobility Ambulated with assistance in hallway  Mobility Response Tolerated well  Mobility performed by Mobility specialist  $Mobility charge 1 Mobility   Pt received in bed willing to participate in mobility. No complaints of pain and asymptomatic. Pt returned to bed with call bell in reach and all needs met.   [facility] Public librarian Phone [phone] Secondary Phone [phone]

## 2021-08-11 ENCOUNTER — Inpatient Hospital Stay (HOSPITAL_COMMUNITY): Payer: Medicare Other

## 2021-08-11 DIAGNOSIS — I4891 Unspecified atrial fibrillation: Secondary | ICD-10-CM | POA: Diagnosis not present

## 2021-08-11 LAB — ECHOCARDIOGRAM COMPLETE
Area-P 1/2: 2.95 cm2
Height: 66 in
S' Lateral: 3.6 cm
Weight: 2193.6 oz

## 2021-08-11 LAB — MAGNESIUM: Magnesium: 2.7 mg/dL — ABNORMAL HIGH (ref 1.7–2.4)

## 2021-08-11 MED ORDER — FUROSEMIDE 10 MG/ML IJ SOLN
20.0000 mg | Freq: Once | INTRAMUSCULAR | Status: AC
  Administered 2021-08-11: 13:00:00 20 mg via INTRAVENOUS
  Filled 2021-08-11: qty 2

## 2021-08-11 MED ORDER — METHYLPREDNISOLONE SODIUM SUCC 40 MG IJ SOLR
40.0000 mg | Freq: Two times a day (BID) | INTRAMUSCULAR | Status: DC
  Administered 2021-08-11 – 2021-08-12 (×2): 40 mg via INTRAVENOUS
  Filled 2021-08-11 (×2): qty 1

## 2021-08-11 MED ORDER — SODIUM CHLORIDE 3 % IN NEBU
4.0000 mL | INHALATION_SOLUTION | RESPIRATORY_TRACT | Status: DC | PRN
  Filled 2021-08-11: qty 4

## 2021-08-11 MED ORDER — FUROSEMIDE 10 MG/ML IJ SOLN: 20.0000 mg | Freq: Two times a day (BID) | INTRAMUSCULAR | Status: DC

## 2021-08-11 MED ORDER — IPRATROPIUM-ALBUTEROL 0.5-2.5 (3) MG/3ML IN SOLN
3.0000 mL | Freq: Two times a day (BID) | RESPIRATORY_TRACT | Status: DC
Start: 2021-08-11 — End: 2021-08-12
  Administered 2021-08-11 – 2021-08-12 (×2): 3 mL via RESPIRATORY_TRACT
  Filled 2021-08-11 (×2): qty 3

## 2021-08-11 MED ORDER — SODIUM CHLORIDE 3 % IN NEBU
4.0000 mL | INHALATION_SOLUTION | Freq: Two times a day (BID) | RESPIRATORY_TRACT | Status: DC
  Filled 2021-08-11 (×2): qty 4

## 2021-08-11 MED ORDER — FUROSEMIDE 10 MG/ML IJ SOLN
20.0000 mg | Freq: Two times a day (BID) | INTRAMUSCULAR | Status: DC
  Administered 2021-08-11 – 2021-08-12 (×2): 20 mg via INTRAVENOUS
  Filled 2021-08-11 (×2): qty 2

## 2021-08-11 MED ORDER — GUAIFENESIN ER 600 MG PO TB12
1200.0000 mg | ORAL_TABLET | Freq: Two times a day (BID) | ORAL | Status: DC
  Administered 2021-08-11 – 2021-08-12 (×3): 1200 mg via ORAL
  Filled 2021-08-11 (×3): qty 2

## 2021-08-11 NOTE — Progress Notes (Addendum)
Mobility Specialist Progress Note:   08/11/21 1144  Mobility  Activity Ambulated in hall  Level of Assistance Modified independent, requires aide device or extra time  Assistive Device None  Distance Ambulated (ft) 260 ft  Mobility Ambulated with assistance in hallway  Mobility Response Tolerated well  Mobility performed by Mobility specialist  $Mobility charge 1 Mobility  SATURATION QUALIFICATIONS: (This note is used to comply with regulatory documentation for home oxygen)  Patient Saturations on Room Air at Rest = 89%  Patient Saturations on [place] while Ambulating = 87%  Patient Saturations on 2 Liters of oxygen while Ambulating = 90%  Patient Saturations on 3 Liters of oxygen while Ambulating = 94%  Pt received in bed willing to participate in mobility. No complaints of pain and asymptomatic. Pt returned to bed with call bell in reach and all needs met.   [facility] Public librarian Phone [phone] Secondary Phone [phone]

## 2021-08-11 NOTE — Progress Notes (Signed)
PROGRESS NOTE    Monica Zamora  [id number] DOB: 04/04/1951 DOA: 08/08/2021 PCP: Dorna Mai, MD   Chief Complaint  Patient presents with   Shortness of Breath    Brief Narrative: Monica Zamora is a 70 y.o. female with medical history significant of paroxysmal atrial fibrillation, COPD, CAD with hx of CABG, HTN, tobacco use disorder who presented to ED with worsening shortness of breath and productive cough.  Associated with chills and fatigue.  Work-up revealed RSV infection.  She was admitted for acute COPD exacerbation secondary to RSV infection and acute hypoxic respiratory failure.  Not on oxygen supplementation at baseline, currently on 2 L.   08/11/2021: Patient was seen and examined at her bedside.  Diffuse wheezing noted on exam, hypoxic with O2 saturation of 87% with ambulation on home oxygen evaluation.  Started on IV Solu-Medrol 40 mg twice daily and IV Lasix 20 mg twice daily.  Assessment & Plan:   Principal Problem:   COPD with acute exacerbation ([place]) Active Problems:   AF (paroxysmal atrial fibrillation) (HCC)   Hypokalemia   CAD (coronary artery disease)   Essential hypertension   Hyperlipidemia   Elevated troponin   Acute respiratory failure with hypoxemia (HCC)   Thrombocytopenia (HCC)   COPD exacerbation (HCC)  COPD exacerbation secondary to RSV(respiratory syncytial virus) infection, POA Respiratory panel by PCR collected on 08/08/2021 returned positive for RSV. Continue steroids, IV Solu-Medrol 40 mg twice daily DuoNebs every 6 hours Hypersaline nebs twice daily x3 days Mucinex 1200 mg twice daily x3 days. Continue Z-Pak, to complete 5 days. Continue incentive spirometer Maintain O2 saturation greater than 90%.  Acute hypoxic respiratory failure secondary to above Not on oxygen supplementation at baseline Currently requiring 2 L to maintain a saturation greater than 90% Febrile Home oxygen evaluation today with O2 saturation of 87%  on room air with ambulation Repeat home oxygen evaluation prior to DC. Continue management as stated above.  Paroxysmal A. fib, rate controlled CHADSVASc >2 given age, HTN etc Continue dronedarone Continue metoprolol 12.5mg  BID Continue apixaban 5mg  BID 2D echo revealed LVEF 50 to 55%,The mid inferolateral segment and basal inferior segment are hypokinetic. Will need to follow-up with cardiology  #CAD s/p CABG #Carotid artery stenosis No anginal symptoms at the time of this visit. Continue aspirin, pravastatin, Lopressor, Eliquis - outpatient f/u with repeat dopplers  #HTN BPs are soft. Hold off home Norvasc. Continue lowest dose of Lopressor, IV Lasix 20 mg twice daily Continue to closely monitor vital signs Maintain MAP greater than 65  #HLD Continue pravastatin  #Thrombocytopenia Suspect secondary to acute illness - HIV Non reactive - Hep C nonreactive  #hx of tobacco abuse - patient quit prior to admission, will likely need cessation meds on discharge Continue nicotine patch.  DVT prophylaxis: apixaban Code Status:  full code Disposition:  Status is: Inpatient Remains inpatient appropriate because: medical necessity   Consultants:  N/a  Procedures:  N/a  Antimicrobials:  Anti-infectives (From admission, onward)    Start     Dose/Rate Route Frequency Ordered Stop   08/09/21 1800  azithromycin (ZITHROMAX) 500 mg in sodium chloride 0.9 % 250 mL IVPB  Status:  Discontinued        500 mg 250 mL/hr over 60 Minutes Intravenous Every 24 hours 08/08/21 1832 08/09/21 0916   08/09/21 1800  azithromycin (ZITHROMAX) tablet 500 mg        500 mg Oral Every 24 hours 08/09/21 0916 08/10/21 1749   08/08/21 1730  cefTRIAXone (  ROCEPHIN) 1 g in sodium chloride 0.9 % 100 mL IVPB        1 g 200 mL/hr over 30 Minutes Intravenous  Once 08/08/21 1722 08/08/21 1813   08/08/21 1730  azithromycin (ZITHROMAX) 500 mg in sodium chloride 0.9 % 250 mL IVPB        500 mg 250 mL/hr  over 60 Minutes Intravenous  Once 08/08/21 1722 08/08/21 1920         Objective: Vitals:   08/10/21 1930 08/10/21 2041 08/11/21 0536 08/11/21 0751  BP: (!) 134/57  (!) 133/52   Pulse: 63  (!) 53   Resp: 18     Temp: 98.6 F (37 C)  97.8 F (36.6 C)   TempSrc: Oral  Oral   SpO2: 92% 95% 94% 94%  Weight:   62.2 kg   Height:        Intake/Output Summary (Last 24 hours) at 08/11/2021 1257 Last data filed at 08/11/2021 0905 Gross per 24 hour  Intake 420 ml  Output 2025 ml  Net -1605 ml   Filed Weights   08/09/21 0500 08/10/21 0351 08/11/21 0536  Weight: 62.6 kg 61.1 kg 62.2 kg    Examination:  General exam: Well-developed well-nourished in no acute distress.  She is alert and oriented x3.   Respiratory system: Diffuse wheezing bilaterally.  Good inspiratory effort.   Cardiovascular system: Regular rate and rhythm no rubs or gallops.  No JVD or thyromegaly noted. Gastrointestinal system: Nondistended nontender with palpation.  Normal bowel sounds present.   Central nervous system: Alert and oriented x3.  Moves all 4 extremities equally. Extremities: Trace lower extremity edema bilaterally.   Psychiatry: Mood is appropriate for condition and setting.  Data Reviewed: I have personally reviewed following labs and imaging studies  CBC: Recent Labs  Lab 08/08/21 1543 08/09/21 0318  WBC 4.7 3.3*  NEUTROABS 3.8  --   HGB 13.4 11.3*  HCT 38.5 33.8*  MCV 104.1* 105.0*  PLT 125* 124*    Basic Metabolic Panel: Recent Labs  Lab 08/08/21 1543 08/08/21 2003 08/09/21 0318 08/11/21 0256  NA 132*  --  135  --   K 3.4*  --  3.9  --   CL 95*  --  102  --   CO2 25  --  26  --   GLUCOSE 133*  --  157*  --   BUN 13  --  14  --   CREATININE 1.13*  --  1.03*  --   CALCIUM 8.6*  --  8.6*  --   MG  --  1.7  --  2.7*    GFR: Estimated Creatinine Clearance: 48.3 mL/min (A) (by C-G formula based on SCr of 1.03 mg/dL (H)).  Liver Function Tests: Recent Labs  Lab  08/08/21 1543  AST 25  ALT 12  ALKPHOS 89  BILITOT 0.7  PROT 7.0  ALBUMIN 3.6    CBG: No results for input(s): GLUCAP in the last 168 hours.   Recent Results (from the past 240 hour(s))  Resp Panel by RT-PCR (Flu A&B, Covid) Nasopharyngeal Swab     Status: None   Collection Time: 08/08/21  3:48 PM   Specimen: Nasopharyngeal Swab; Nasopharyngeal(NP) swabs in vial transport medium  Result Value Ref Range Status   SARS Coronavirus 2 by RT PCR NEGATIVE NEGATIVE Final    Comment: (NOTE) SARS-CoV-2 target nucleic acids are NOT DETECTED.  The SARS-CoV-2 RNA is generally detectable in upper respiratory specimens during the acute phase  of infection. The lowest concentration of SARS-CoV-2 viral copies this assay can detect is 138 copies/mL. A negative result does not preclude SARS-Cov-2 infection and should not be used as the sole basis for treatment or other patient management decisions. A negative result may occur with  improper specimen collection/handling, submission of specimen other than nasopharyngeal swab, presence of viral mutation(s) within the areas targeted by this assay, and inadequate number of viral copies(<138 copies/mL). A negative result must be combined with clinical observations, patient history, and epidemiological information. The expected result is Negative.  Fact Sheet for Patients:  EntrepreneurPulse.com.au  Fact Sheet for Healthcare Providers:  IncredibleEmployment.be  This test is no t yet approved or cleared by the [place] FDA and  has been authorized for detection and/or diagnosis of SARS-CoV-2 by FDA under an Emergency Use Authorization (EUA). This EUA will remain  in effect (meaning this test can be used) for the duration of the COVID-19 declaration under Section 564(b)(1) of the Act, 21 U.S.C.section 360bbb-3(b)(1), unless the authorization is terminated  or revoked sooner.       Influenza A by PCR  NEGATIVE NEGATIVE Final   Influenza B by PCR NEGATIVE NEGATIVE Final    Comment: (NOTE) The Xpert Xpress SARS-CoV-2/FLU/RSV plus assay is intended as an aid in the diagnosis of influenza from Nasopharyngeal swab specimens and should not be used as a sole basis for treatment. Nasal washings and aspirates are unacceptable for Xpert Xpress SARS-CoV-2/FLU/RSV testing.  Fact Sheet for Patients: EntrepreneurPulse.com.au  Fact Sheet for Healthcare Providers: IncredibleEmployment.be  This test is not yet approved or cleared by the [place] FDA and has been authorized for detection and/or diagnosis of SARS-CoV-2 by FDA under an Emergency Use Authorization (EUA). This EUA will remain in effect (meaning this test can be used) for the duration of the COVID-19 declaration under Section 564(b)(1) of the Act, 21 U.S.C. section 360bbb-3(b)(1), unless the authorization is terminated or revoked.  Performed at [facility], [place] [address], [place], [place] [address]   Respiratory (~20 pathogens) panel by PCR     Status: Abnormal   Collection Time: 08/08/21  3:48 PM   Specimen: Nasopharyngeal Swab; Respiratory  Result Value Ref Range Status   Adenovirus NOT DETECTED NOT DETECTED Final   Coronavirus 229E NOT DETECTED NOT DETECTED Final    Comment: (NOTE) The Coronavirus on the Respiratory Panel, DOES NOT test for the novel  Coronavirus (2019 nCoV)    Coronavirus HKU1 NOT DETECTED NOT DETECTED Final   Coronavirus NL63 NOT DETECTED NOT DETECTED Final   Coronavirus OC43 NOT DETECTED NOT DETECTED Final   Metapneumovirus NOT DETECTED NOT DETECTED Final   Rhinovirus / Enterovirus NOT DETECTED NOT DETECTED Final   Influenza A NOT DETECTED NOT DETECTED Final   Influenza B NOT DETECTED NOT DETECTED Final   Parainfluenza Virus 1 NOT DETECTED NOT DETECTED Final   Parainfluenza Virus 2 NOT DETECTED NOT DETECTED Final   Parainfluenza Virus 3 NOT DETECTED NOT  DETECTED Final   Parainfluenza Virus 4 NOT DETECTED NOT DETECTED Final   Respiratory Syncytial Virus DETECTED (A) NOT DETECTED Final   Bordetella pertussis NOT DETECTED NOT DETECTED Final   Bordetella Parapertussis NOT DETECTED NOT DETECTED Final   Chlamydophila pneumoniae NOT DETECTED NOT DETECTED Final   Mycoplasma pneumoniae NOT DETECTED NOT DETECTED Final    Comment: Performed at [facility], [place]. [address], [place], [place] [address]  Culture, blood (routine x 2)     Status: None (Preliminary result)  Collection Time: 08/08/21  5:49 PM   Specimen: BLOOD RIGHT FOREARM  Result Value Ref Range Status   Specimen Description BLOOD RIGHT FOREARM  Final   Special Requests   Final    BOTTLES DRAWN AEROBIC AND ANAEROBIC Blood Culture results may not be optimal due to an inadequate volume of blood received in culture bottles   Culture   Final    NO GROWTH 3 DAYS Performed at [facility], [place] [address], [place], [place] [address]    Report Status PENDING  Incomplete  Culture, blood (routine x 2)     Status: None (Preliminary result)   Collection Time: 08/08/21  5:52 PM   Specimen: BLOOD LEFT FOREARM  Result Value Ref Range Status   Specimen Description BLOOD LEFT FOREARM  Final   Special Requests   Final    BOTTLES DRAWN AEROBIC AND ANAEROBIC Blood Culture results may not be optimal due to an inadequate volume of blood received in culture bottles   Culture   Final    NO GROWTH 3 DAYS Performed at [facility], [place] [address], [place],  [address]    Report Status PENDING  Incomplete      Radiology Studies: ECHOCARDIOGRAM COMPLETE  Result Date: 08/11/2021    ECHOCARDIOGRAM REPORT   Patient Name:   Monica Zamora Date of Exam: 08/11/2021 Medical Rec #:  [id number]           Height:       66.0 in Accession #:    [id number]          Weight:       137.1 lb Date of Birth:  [birth year]           BSA:          1.703 m Patient Age:    74 years            BP:            133/52 mmHg Patient Gender: F                   HR:           56 bpm. Exam Location:  Outpatient Procedure: 2D Echo Indications:    Atrial Fibrillation I48.91  History:        Patient has no prior history of Echocardiogram examinations.                 CAD, Prior CABG, COPD; Risk Factors:Hypertension.  Sonographer:    Mikki Santee RDCS Referring Phys: [id number] [place]  1. Left ventricular ejection fraction, by estimation, is 50 to 55%. The left ventricle has low normal function. Left ventricular endocardial border not optimally defined to evaluate regional wall motion. There is mild left ventricular hypertrophy. Left ventricular diastolic parameters are indeterminate.  2. Right ventricular systolic function is normal. The right ventricular size is mildly enlarged. There is mildly elevated pulmonary artery systolic pressure. The estimated right ventricular systolic pressure is 123456 mmHg.  3. Right atrial size was moderately dilated.  4. The mitral valve is normal in structure. Mild mitral valve regurgitation. No evidence of mitral stenosis.  5. The aortic valve is tricuspid. Aortic valve regurgitation is not visualized. No aortic stenosis is present.  6. The inferior vena cava is normal in size with greater than 50% respiratory variability, suggesting right atrial pressure of 3 mmHg. FINDINGS  Left Ventricle: Left ventricular ejection fraction, by estimation, is 50 to 55%. The  left ventricle has low normal function. Left ventricular endocardial border not optimally defined to evaluate regional wall motion. The left ventricular internal cavity  size was normal in size. There is mild left ventricular hypertrophy. Left ventricular diastolic parameters are indeterminate.  LV Wall Scoring: The mid inferolateral segment and basal inferior segment are hypokinetic. Right Ventricle: The right ventricular size is mildly enlarged. No increase in right ventricular wall thickness. Right ventricular  systolic function is normal. There is mildly elevated pulmonary artery systolic pressure. The tricuspid regurgitant velocity is 2.91 m/s, and with an assumed right atrial pressure of 3 mmHg, the estimated right ventricular systolic pressure is 36.9 mmHg. Left Atrium: Left atrial size was normal in size. Right Atrium: Right atrial size was moderately dilated. Pericardium: There is no evidence of pericardial effusion. Mitral Valve: The mitral valve is normal in structure. Mild mitral valve regurgitation. No evidence of mitral valve stenosis. Tricuspid Valve: The tricuspid valve is normal in structure. Tricuspid valve regurgitation is mild . No evidence of tricuspid stenosis. Aortic Valve: The aortic valve is tricuspid. Aortic valve regurgitation is not visualized. No aortic stenosis is present. Pulmonic Valve: The pulmonic valve was normal in structure. Pulmonic valve regurgitation is mild. No evidence of pulmonic stenosis. Aorta: The aortic root is normal in size and structure. Venous: The inferior vena cava is normal in size with greater than 50% respiratory variability, suggesting right atrial pressure of 3 mmHg. IAS/Shunts: No atrial level shunt detected by color flow Doppler.  LEFT VENTRICLE PLAX 2D LVIDd:         4.80 cm   Diastology LVIDs:         3.60 cm   LV e' medial:    6.85 cm/s LV PW:         1.20 cm   LV E/e' medial:  12.6 LV IVS:        1.00 cm   LV e' lateral:   12.10 cm/s LVOT diam:     2.30 cm   LV E/e' lateral: 7.1 LV SV:         67 LV SV Index:   40 LVOT Area:     4.15 cm  RIGHT VENTRICLE RV S prime:     11.90 cm/s TAPSE (M-mode): 1.5 cm LEFT ATRIUM           Index        RIGHT ATRIUM           Index LA diam:      3.30 cm 1.94 cm/m   RA Area:     25.00 cm LA Vol (A2C): 60.1 ml 35.28 ml/m  RA Volume:   82.30 ml  48.32 ml/m LA Vol (A4C): 56.1 ml 32.94 ml/m  AORTIC VALVE LVOT Vmax:   76.50 cm/s LVOT Vmean:  46.100 cm/s LVOT VTI:    0.162 m  AORTA Ao Root diam: 3.10 cm MITRAL VALVE                TRICUSPID VALVE MV Area (PHT): 2.95 cm    TR Peak grad:   33.9 mmHg MV Decel Time: 257 msec    TR Vmax:        291.00 cm/s MV E velocity: 86.50 cm/s MV A velocity: 56.60 cm/s  SHUNTS MV E/A ratio:  1.53        Systemic VTI:  0.16 m  Systemic Diam: 2.30 cm Cherlynn Kaiser MD Electronically signed by Cherlynn Kaiser MD Signature Date/Time: 08/11/2021/10:52:03 AM    Final         Scheduled Meds:  ALPRAZolam  0.5 mg Oral Daily   amLODipine  5 mg Oral Daily   apixaban  5 mg Oral BID   aspirin  81 mg Oral Daily   dronedarone  200 mg Oral BID   folic acid  1 mg Oral Daily   furosemide  20 mg Intravenous Once   furosemide  40 mg Oral Daily   guaiFENesin  600 mg Oral BID   ipratropium-albuterol  3 mL Nebulization TID   metoprolol tartrate  12.5 mg Oral BID   multivitamin with minerals  1 tablet Oral Daily   nicotine  14 mg Transdermal Daily   potassium chloride SA  20 mEq Oral Daily   pravastatin  20 mg Oral q1800   predniSONE  40 mg Oral Q breakfast   thiamine  100 mg Oral Daily   Or   thiamine  100 mg Intravenous Daily   Continuous Infusions:   LOS: 2 days    Kayleen Memos, MD [facility]   To contact the attending provider between 7A-7P or the covering provider during after hours 7P-7A, please log into the web site [email or web address] and access using universal [place] password for that web site. If you do not have the password, please call the hospital operator.  08/11/2021, 12:57 PM

## 2021-08-11 NOTE — Social Work (Addendum)
  CSW met with pt to provide resources for to assist with smoking cessation and pt denies alcohol use. Pt explained that she had quit in the past with the help of patches and it worked. Pt was amenable to receiving the resources and appreciative for CSW discussing and providing the resources.

## 2021-08-12 ENCOUNTER — Encounter: Payer: Self-pay | Admitting: Cardiology

## 2021-08-12 MED ORDER — FUROSEMIDE 10 MG/ML IJ SOLN
20.0000 mg | Freq: Once | INTRAMUSCULAR | Status: AC
  Administered 2021-08-12: 06:00:00 20 mg via INTRAVENOUS
  Filled 2021-08-12: qty 2

## 2021-08-12 MED ORDER — THIAMINE HCL 100 MG PO TABS: 100.0000 mg | ORAL_TABLET | Freq: Every day | ORAL | 0 refills | Status: AC

## 2021-08-12 MED ORDER — ADULT MULTIVITAMIN W/MINERALS CH: 1.0000 | ORAL_TABLET | Freq: Every day | ORAL | 0 refills | Status: AC

## 2021-08-12 MED ORDER — FOLIC ACID 1 MG PO TABS
1.0000 mg | ORAL_TABLET | Freq: Every day | ORAL | 0 refills | Status: AC
Start: 2021-08-13 — End: 2021-11-11

## 2021-08-12 MED ORDER — POTASSIUM CHLORIDE CRYS ER 20 MEQ PO TBCR
40.0000 meq | EXTENDED_RELEASE_TABLET | Freq: Once | ORAL | Status: AC
  Administered 2021-08-12: 06:00:00 40 meq via ORAL
  Filled 2021-08-12: qty 2

## 2021-08-12 MED ORDER — PRAVASTATIN SODIUM 20 MG PO TABS: 20.0000 mg | ORAL_TABLET | Freq: Every day | ORAL | 0 refills | Status: DC

## 2021-08-12 MED ORDER — NICOTINE 14 MG/24HR TD PT24
14.0000 mg | MEDICATED_PATCH | Freq: Every day | TRANSDERMAL | 0 refills | Status: DC
Start: 2021-08-13 — End: 2023-02-27

## 2021-08-12 MED ORDER — FUROSEMIDE 10 MG/ML IJ SOLN
20.0000 mg | INTRAMUSCULAR | Status: AC
  Administered 2021-08-12: 12:00:00 20 mg via INTRAVENOUS
  Filled 2021-08-12: qty 2

## 2021-08-12 NOTE — Progress Notes (Signed)
Cardiology Clinic Note   Patient Name: Monica Zamora Date of Encounter: 08/15/2021  Primary Care Provider:  Georganna Skeans, MD Primary Cardiologist:  Olga Millers, MD  Patient Profile    Monica Zamora presents to the clinic today for follow-up evaluation of her chest discomfort.  Past Medical History    Past Medical History:  Diagnosis Date   A-fib (HCC)    COPD (chronic obstructive pulmonary disease) (HCC)    Coronary artery disease    Hx of CABG    Hypertension    PUD (peptic ulcer disease)    Past Surgical History:  Procedure Laterality Date   ABDOMINAL HYSTERECTOMY     CHOLECYSTECTOMY     CORONARY ARTERY BYPASS GRAFT     ruptured disc     SPINE SURGERY      Allergies  No Known Allergies  History of Present Illness    CHALEE Zamora has a PMH of coronary artery disease, hyperlipidemia, carotid artery disease, paroxysmal atrial fibrillation, and hypertension.  She underwent CABG in 2021.  She has known dyspnea related to her COPD.  She was last seen by Dr. Jens Som on 07/08/2021 and denied orthopnea, PND, lower extremity swelling, chest pain and syncope.  She was started on statin therapy at that time.  Her records were requested from [facility].  She was admitted to the hospital 08/08/2021 and discharged on 08/12/2021.  She was noted to have worsening shortness of breath and productive cough.  She did have associated chills and fatigue.  Her pulmonology work-up showed RSV infection.  She was admitted for acute COPD exacerbation secondary to RSV infection with acute hypoxemia and respiratory failure.  She received supplemental oxygen, IV steroids, bronchodilators, Rocephin and azithromycin.  She presents to the clinic today for follow-up evaluation states she is doing much better with her breathing.  She is now just using oxygen 2 L at night and as needed through the day.  She presents to the office today on room air and is saturating in the mid to high  90s.  She has been using guaifenesin to help loosen secretions.  She also reports that she was previously taking Xanax twice daily.  We discussed her previous SSRI treatment and recommended that she follow-up with PCP for management of her anxiety.  We recommended that she contact her PCP for humidification of her nighttime oxygen.  I will give her the high-fiber diet and information, repeat fasting lipids in  6 to 8 weeks, continue her current medication regimen, and have her follow-up in 4 to 6 weeks.  Today she denies chest pain, increased shortness of breath, lower extremity edema, fatigue, palpitations, melena, hematuria, hemoptysis, diaphoresis, weakness, presyncope, syncope, orthopnea, and PND.     Home Medications    Prior to Admission medications   Medication Sig Start Date End Date Taking? Authorizing Provider  acetaminophen (TYLENOL) 500 MG tablet Take 1,000 mg by mouth 2 (two) times daily as needed (pain).    [provider]  albuterol (VENTOLIN HFA) 108 (90 Base) MCG/ACT inhaler Inhale 1 puff into the lungs 2 (two) times daily as needed for wheezing or shortness of breath. 07/10/21   [provider]  ALPRAZolam Prudy Feeler) 0.5 MG tablet Take 1 tablet (0.5 mg total) by mouth at bedtime as needed for anxiety. Patient taking differently: Take 0.5 mg by mouth 2 (two) times daily. 07/29/21   Georganna Skeans, MD  apixaban (ELIQUIS) 5 MG TABS tablet Take 5 mg by mouth 2 (two) times  daily.    [provider]  aspirin 81 MG chewable tablet Chew 81 mg by mouth daily.    [provider]  dronedarone (MULTAQ) 400 MG tablet Take 200 mg by mouth 2 (two) times daily.    [provider]  folic acid (FOLVITE) 1 MG tablet Take 1 tablet (1 mg total) by mouth daily. 08/13/21 11/11/21  Darlin DropHall, Carole N, DO  furosemide (LASIX) 40 MG tablet Take 1 tablet (40 mg total) by mouth daily. 06/24/21   Georganna SkeansWilson, Amelia, MD  metoprolol tartrate (LOPRESSOR) 25 MG tablet Take 12.5  mg by mouth 2 (two) times daily. 07/23/21   [provider]  Multiple Vitamin (MULTIVITAMIN WITH MINERALS) TABS tablet Take 1 tablet by mouth daily. 08/13/21 11/11/21  Darlin DropHall, Carole N, DO  nicotine (NICODERM CQ - DOSED IN MG/24 HOURS) 14 mg/24hr patch Place 1 patch (14 mg total) onto the skin daily. 08/13/21   Darlin DropHall, Carole N, DO  potassium chloride SA (KLOR-CON) 20 MEQ tablet Take 20 mEq by mouth daily.    [provider]  pravastatin (PRAVACHOL) 20 MG tablet Take 1 tablet (20 mg total) by mouth daily at 6 PM. 08/12/21 09/11/21  Darlin DropHall, Carole N, DO  rosuvastatin (CRESTOR) 10 MG tablet Take 1 tablet (10 mg total) by mouth daily. Patient not taking: Reported on 08/08/2021 07/12/21 10/10/21  Lewayne Buntingrenshaw, Brian S, MD  thiamine 100 MG tablet Take 1 tablet (100 mg total) by mouth daily. 08/13/21 11/11/21  Darlin DropHall, Carole N, DO  venlafaxine XR (EFFEXOR XR) 37.5 MG 24 hr capsule Take 1 capsule (37.5 mg total) by mouth daily with breakfast. Patient not taking: Reported on 08/08/2021 05/06/21   Georganna SkeansWilson, Amelia, MD    Family History    Family History  Problem Relation Age of Onset   Lupus Mother    She indicated that her mother is deceased. She indicated that her father is deceased. She indicated that her daughter is deceased.  Social History    Social History   Socioeconomic History   Marital status: Divorced    Spouse name: Not on file   Number of children: 2   Years of education: Not on file   Highest education level: Not on file  Occupational History   Not on file  Tobacco Use   Smoking status: Former    Packs/day: 0.25    Types: Cigarettes    Quit date: 08/05/2021    Years since quitting: 0.0   Smokeless tobacco: Never  Substance and Sexual Activity   Alcohol use: Yes    Comment: Occasional   Drug use: Never   Sexual activity: Not on file  Other Topics Concern   Not on file  Social History Narrative   Not on file   Social Determinants of Health   Financial Resource  Strain: Not on file  Food Insecurity: Not on file  Transportation Needs: Not on file  Physical Activity: Not on file  Stress: Not on file  Social Connections: Not on file  Intimate Partner Violence: Not on file     Review of Systems    General:  No chills, fever, night sweats or weight changes.  Cardiovascular:  No chest pain, dyspnea on exertion, edema, orthopnea, palpitations, paroxysmal nocturnal dyspnea. Dermatological: No rash, lesions/masses Respiratory: Productive cough, no increased dyspnea Urologic: No hematuria, dysuria Abdominal:   No nausea, vomiting, diarrhea, bright red blood per rectum, melena, or hematemesis Neurologic:  No visual changes, wkns, changes in mental status. All other systems reviewed and  are otherwise negative except as noted above.  Physical Exam    VS:  BP 128/76   Pulse 67   Ht 5\' 6"  (1.676 m)   Wt 138 lb (62.6 kg)   SpO2 97%   BMI 22.27 kg/m  , BMI Body mass index is 22.27 kg/m. GEN: Well nourished, well developed, in no acute distress. HEENT: normal. Neck: Supple, no JVD, carotid bruits, or masses. Cardiac: RRR, no murmurs, rubs, or gallops. No clubbing, cyanosis, edema.  Radials/DP/PT 2+ and equal bilaterally.  Respiratory:  Respirations regular and unlabored, clear to auscultation bilaterally. GI: Soft, nontender, nondistended, BS + x 4. MS: no deformity or atrophy. Skin: warm and dry, no rash. Neuro:  Strength and sensation are intact. Psych: Normal affect.  Accessory Clinical Findings    Recent Labs: 08/08/2021: ALT 12; B Natriuretic Peptide 876.6 08/09/2021: BUN 14; Creatinine, Ser 1.03; Hemoglobin 11.3; Platelets 124; Potassium 3.9; Sodium 135 08/11/2021: Magnesium 2.7   Recent Lipid Panel    Component Value Date/Time   CHOL 166 08/09/2021 1853   TRIG 59 08/09/2021 1853   HDL 68 08/09/2021 1853   CHOLHDL 2.4 08/09/2021 1853   VLDL 12 08/09/2021 1853   LDLCALC 86 08/09/2021 1853    ECG personally reviewed by me  today-none today.  Echocardiogram 08/11/2021 IMPRESSIONS     1. Left ventricular ejection fraction, by estimation, is 50 to 55%. The  left ventricle has low normal function. Left ventricular endocardial  border not optimally defined to evaluate regional wall motion. There is  mild left ventricular hypertrophy. Left  ventricular diastolic parameters are indeterminate.   2. Right ventricular systolic function is normal. The right ventricular  size is mildly enlarged. There is mildly elevated pulmonary artery  systolic pressure. The estimated right ventricular systolic pressure is  123456 mmHg.   3. Right atrial size was moderately dilated.   4. The mitral valve is normal in structure. Mild mitral valve  regurgitation. No evidence of mitral stenosis.   5. The aortic valve is tricuspid. Aortic valve regurgitation is not  visualized. No aortic stenosis is present.   6. The inferior vena cava is normal in size with greater than 50%  respiratory variability, suggesting right atrial pressure of 3 mmHg.  Assessment & Plan   1.  Paroxysmal atrial fibrillation-heart rate today 67.  Denies recent episodes of increased or irregular heartbeat since discharge. Continue metoprolol, apixaban, Multaq Heart healthy low-sodium diet-salty 6 given Increase physical activity as tolerated  Coronary artery disease-denies chest pain today.  Status post CABG Continue aspirin, pravastatin, metoprolol, Heart healthy low-sodium diet-salty 6 given Increase physical activity as tolerated  Hyperlipidemia-08/09/2021: Cholesterol 166; HDL 68; LDL Cholesterol 86; Triglycerides 59; VLDL 12 Continue aspirin, pravastatin Heart healthy low-sodium high-fiber diet Increase physical activity as tolerated Repeat fasting lipids and LFTs in 6-8 weeks.  Essential hypertension-BP today 128/76.  Well-controlled at home. Continue metoprolol Heart healthy low-sodium diet-salty 6 given Increase physical activity as  tolerated  COPD-remains on supplemental oxygen at night at home and as needed through the day..  Reports breathing is much improved.  Recently admitted with COPD exacerbation secondary to RSV.  Received IV steroids and Rocephin and azithromycin. Follow-up with PCP  Disposition: Follow-up with Dr. Stanford Breed or me in 4-6 months.  Jossie Ng. Imberly Troxler NP-C    08/15/2021, 11:17 AM [place] [address] Office [phone] Fax [phone]  Notice: This dictation was prepared with Dragon dictation along with smaller phrase technology. Any transcriptional  errors that result from this process are unintentional and may not be corrected upon review.  I spent 15 minutes examining this patient, reviewing medications, and using patient centered shared decision making involving her cardiac care.  Prior to her visit I spent greater than 20 minutes reviewing her past medical history,  medications, and prior cardiac tests.

## 2021-08-12 NOTE — Discharge Summary (Addendum)
Discharge Summary  Monica Zamora GMW:[id number] DOB: 1950/12/30  PCP: Georganna Skeans, MD  Admit date: 08/08/2021 Discharge date: 08/12/2021  Time spent: 35 minutes.  Recommendations for Outpatient Follow-up:  Follow-up with your cardiologist, please keep your appointment, testing August 15, 2021 at 10:55 AM, please arrive at 10:40 AM. Follow-up with your primary care provider Take your medications as prescribed  Discharge Diagnoses:  Active Hospital Problems   Diagnosis Date Noted   COPD with acute exacerbation (HCC) 08/08/2021   COPD exacerbation (HCC) 08/09/2021   AF (paroxysmal atrial fibrillation) (HCC) 08/08/2021   Hypokalemia 08/08/2021   CAD (coronary artery disease) 08/08/2021   Essential hypertension 08/08/2021   Hyperlipidemia 08/08/2021   Elevated troponin 08/08/2021   Acute respiratory failure with hypoxemia (HCC) 08/08/2021   Thrombocytopenia (HCC) 08/08/2021    Resolved Hospital Problems  No resolved problems to display.    Discharge Condition: Stable  Diet recommendation: Resume previous diet.  Vitals:   08/12/21 1123 08/12/21 1537  BP: (!) 109/98 140/65  Pulse: (!) 49 (!) 58  Resp: 20 19  Temp: 98.5 F (36.9 C)   SpO2: 95% 91%    History of present illness:   Monica Zamora is a 70 y.o. female with medical history significant of paroxysmal atrial fibrillation, COPD, CAD with hx of CABG, HTN, tobacco use disorder who presented to ED with worsening shortness of breath and productive cough.  Associated with chills and fatigue.  Work-up revealed RSV infection.  She was admitted for acute COPD exacerbation secondary to RSV infection and acute hypoxic respiratory failure.  Not on oxygen supplementation at baseline, currently on 2 L.     08/12/2021: Patient was seen and examined at bedside.  There were no acute events overnight.  She has no new complaints.  She failed home oxygen evaluation with O2 saturation of 88% on room air with ambulation.   She is requiring 2 L oxygen nasal cannula to maintain O2 saturations greater than 90%.  Hospital Course:  Principal Problem:   COPD with acute exacerbation (HCC) Active Problems:   AF (paroxysmal atrial fibrillation) (HCC)   Hypokalemia   CAD (coronary artery disease)   Essential hypertension   Hyperlipidemia   Elevated troponin   Acute respiratory failure with hypoxemia (HCC)   Thrombocytopenia (HCC)   COPD exacerbation (HCC)  COPD exacerbation secondary to RSV(respiratory syncytial virus) infection, POA Respiratory panel by PCR collected on 08/08/2021 returned positive for RSV. Received IV steroids, IV Solu-Medrol 40 mg twice daily Received bronchodilators and pulmonary toilet. Received Rocephin and azithromycin, completed course. Maintain O2 saturation greater than 90%.  Acute hypoxic respiratory failure secondary to above Not on oxygen supplementation at baseline Management per above. Maintain O2 saturation greater than 90%. Requiring 2 L nasal cannula Follow-up with your PCP.   Paroxysmal A. fib, rate controlled Rate controlled. Continue dronedarone Continue metoprolol 12.5mg  BID Continue apixaban 5mg  BID 2D echo revealed LVEF 50 to 55%,The mid inferolateral segment and basal inferior segment are hypokinetic. Follow-up with your cardiologist Dr. .  Keep your appointment as stated above.   #CAD s/p CABG #Carotid artery stenosis Denies any anginal symptoms. Continue aspirin, pravastatin, Lopressor, Eliquis   Follow-up with your cardiologist.   #HTN Hold off home Norvasc due to soft BPs. Continue metoprolol 12.5 mg twice daily.   #HLD Continue pravastatin   #Thrombocytopenia Suspect secondary to acute illness - HIV Non reactive - Hep C nonreactive   #hx of tobacco abuse - patient quit prior to admission, will likely need  cessation meds on discharge Continue nicotine patch. Follow-up with your PCP    Code Status:  full code    Antimicrobials:   Anti-infectives (From admission, onward)        Start     Dose/Rate Route Frequency Ordered Stop    08/09/21 1800   azithromycin (ZITHROMAX) 500 mg in sodium chloride 0.9 % 250 mL IVPB  Status:  Discontinued        500 mg 250 mL/hr over 60 Minutes Intravenous Every 24 hours 08/08/21 1832 08/09/21 0916    08/09/21 1800   azithromycin (ZITHROMAX) tablet 500 mg        500 mg Oral Every 24 hours 08/09/21 0916 08/10/21 1749    08/08/21 1730   cefTRIAXone (ROCEPHIN) 1 g in sodium chloride 0.9 % 100 mL IVPB        1 g 200 mL/hr over 30 Minutes Intravenous  Once 08/08/21 1722 08/08/21 1813    08/08/21 1730   azithromycin (ZITHROMAX) 500 mg in sodium chloride 0.9 % 250 mL IVPB        500 mg 250 mL/hr over 60 Minutes Intravenous  Once 08/08/21 1722 08/08/21 1920            Procedures: 2D echo  Consultations: None  Discharge Exam: BP 140/65 (BP Location: Right Arm)   Pulse (!) 58   Temp 98.5 F (36.9 C) (Oral)   Resp 19   Ht 5\' 6"  (1.676 m)   Wt 61.6 kg   SpO2 91%   BMI 21.93 kg/m  General: 70 y.o. year-old female well developed well nourished in no acute distress.  Alert and oriented x3. Cardiovascular: Regular rate and rhythm with no rubs or gallops.  No thyromegaly or JVD noted.   Respiratory: Clear to auscultation with no wheezes or rales. Good inspiratory effort. Abdomen: Soft nontender nondistended with normal bowel sounds x4 quadrants. Musculoskeletal: No lower extremity edema. 2/4 pulses in all 4 extremities. Skin: No ulcerative lesions noted or rashes, Psychiatry: Mood is appropriate for condition and setting  Discharge Instructions You were cared for by a hospitalist during your hospital stay. If you have any questions about your discharge medications or the care you received while you were in the hospital after you are discharged, you can call the unit and asked to speak with the hospitalist on call if the hospitalist that took care of you is not available. Once you  are discharged, your primary care physician will handle any further medical issues. Please note that NO REFILLS for any discharge medications will be authorized once you are discharged, as it is imperative that you return to your primary care physician (or establish a relationship with a primary care physician if you do not have one) for your aftercare needs so that they can reassess your need for medications and monitor your lab values.   Allergies as of 08/12/2021   No Known Allergies      Medication List     STOP taking these medications    rosuvastatin 10 MG tablet Commonly known as: CRESTOR   venlafaxine XR 37.5 MG 24 hr capsule Commonly known as: Effexor XR       TAKE these medications    acetaminophen 500 MG tablet Commonly known as: TYLENOL Take 1,000 mg by mouth 2 (two) times daily as needed (pain).   albuterol 108 (90 Base) MCG/ACT inhaler Commonly known as: VENTOLIN HFA Inhale 1 puff into the lungs 2 (two) times daily as needed for wheezing or shortness  of breath.   ALPRAZolam 0.5 MG tablet Commonly known as: XANAX Take 1 tablet (0.5 mg total) by mouth at bedtime as needed for anxiety. What changed: when to take this   apixaban 5 MG Tabs tablet Commonly known as: ELIQUIS Take 5 mg by mouth 2 (two) times daily.   aspirin 81 MG chewable tablet Chew 81 mg by mouth daily.   dronedarone 400 MG tablet Commonly known as: MULTAQ Take 200 mg by mouth 2 (two) times daily.   folic acid 1 MG tablet Commonly known as: FOLVITE Take 1 tablet (1 mg total) by mouth daily. Start taking on: August 13, 2021   furosemide 40 MG tablet Commonly known as: LASIX Take 1 tablet (40 mg total) by mouth daily.   metoprolol tartrate 25 MG tablet Commonly known as: LOPRESSOR Take 12.5 mg by mouth 2 (two) times daily.   multivitamin with minerals Tabs tablet Take 1 tablet by mouth daily. Start taking on: August 13, 2021   nicotine 14 mg/24hr patch Commonly known as:  NICODERM CQ - dosed in mg/24 hours Place 1 patch (14 mg total) onto the skin daily. Start taking on: August 13, 2021   potassium chloride SA 20 MEQ tablet Commonly known as: KLOR-CON Take 20 mEq by mouth daily.   pravastatin 20 MG tablet Commonly known as: PRAVACHOL Take 1 tablet (20 mg total) by mouth daily at 6 PM.   thiamine 100 MG tablet Take 1 tablet (100 mg total) by mouth daily. Start taking on: August 13, 2021               Durable Medical Equipment  (From admission, onward)           Start     Ordered   08/12/21 1104  For home use only DME oxygen  Once       Question Answer Comment  Length of Need 6 Months   Mode or (Route) Nasal cannula   Liters per Minute 2   Frequency Continuous (stationary and portable oxygen unit needed)   Oxygen conserving device Yes   Oxygen delivery system Gas      08/12/21 1103           No Known Allergies  Follow-up Information     Georganna Skeans, MD Follow up.   Specialty: Family Medicine Contact information: [address] [place] [address] [phone]         Georganna Skeans, MD Follow up.   Specialty: Family Medicine Contact information: [address] [place] [address] [phone]         Lewayne Bunting, MD. Go to.   Specialty: Cardiology Why: Please keep your appointment on Thursday, August 15, 2021 at 10:55 AM.  Please arrive by 10:40 AM.  You will be seen by Verdon Cummins, PA(Dr. [facility] physician assistant).  Thank you. Contact information: [address] [place] [address] [phone]         [facility] Follow up.   Why: oxygen Contact information: [address] [place] [address] [phone]                  The results of significant diagnostics from this hospitalization (including imaging, microbiology, ancillary and laboratory) are listed below for reference.    Significant Diagnostic Studies: DG  Chest Portable 1 View  Result Date: 08/08/2021 CLINICAL DATA:  70 year old female with shortness of breath and chest pain EXAM: PORTABLE CHEST 1 VIEW COMPARISON:  None.  FINDINGS: Cardiomegaly and median sternotomy noted. RIGHT basilar atelectasis/scarring noted. There is no evidence of focal airspace disease, pulmonary edema, suspicious pulmonary nodule/mass, pleural effusion, or pneumothorax. No acute bony abnormalities are identified. IMPRESSION: Cardiomegaly with RIGHT basilar atelectasis/scarring. Electronically Signed   By: Harmon Pier M.D.   On: 08/08/2021 16:05   ECHOCARDIOGRAM COMPLETE  Result Date: 08/11/2021    ECHOCARDIOGRAM REPORT   Patient Name:   Monica Zamora Date of Exam: 08/11/2021 Medical Rec #:  [id number]           Height:       66.0 in Accession #:    [id number]          Weight:       137.1 lb Date of Birth:  October 24, 1950           BSA:          1.703 m Patient Age:    70 years            BP:           133/52 mmHg Patient Gender: F                   HR:           56 bpm. Exam Location:  Outpatient Procedure: 2D Echo Indications:    Atrial Fibrillation I48.91  History:        Patient has no prior history of Echocardiogram examinations.                 CAD, Prior CABG, COPD; Risk Factors:Hypertension.  Sonographer:    Thurman Coyer RDCS Referring Phys: [id number] CRYSTAL AZU IMPRESSIONS  1. Left ventricular ejection fraction, by estimation, is 50 to 55%. The left ventricle has low normal function. Left ventricular endocardial border not optimally defined to evaluate regional wall motion. There is mild left ventricular hypertrophy. Left ventricular diastolic parameters are indeterminate.  2. Right ventricular systolic function is normal. The right ventricular size is mildly enlarged. There is mildly elevated pulmonary artery systolic pressure. The estimated right ventricular systolic pressure is 36.9 mmHg.  3. Right atrial size was moderately dilated.  4. The mitral valve is normal in  structure. Mild mitral valve regurgitation. No evidence of mitral stenosis.  5. The aortic valve is tricuspid. Aortic valve regurgitation is not visualized. No aortic stenosis is present.  6. The inferior vena cava is normal in size with greater than 50% respiratory variability, suggesting right atrial pressure of 3 mmHg. FINDINGS  Left Ventricle: Left ventricular ejection fraction, by estimation, is 50 to 55%. The left ventricle has low normal function. Left ventricular endocardial border not optimally defined to evaluate regional wall motion. The left ventricular internal cavity  size was normal in size. There is mild left ventricular hypertrophy. Left ventricular diastolic parameters are indeterminate.  LV Wall Scoring: The mid inferolateral segment and basal inferior segment are hypokinetic. Right Ventricle: The right ventricular size is mildly enlarged. No increase in right ventricular wall thickness. Right ventricular systolic function is normal. There is mildly elevated pulmonary artery systolic pressure. The tricuspid regurgitant velocity is 2.91 m/s, and with an assumed right atrial pressure of 3 mmHg, the estimated right ventricular systolic pressure is 36.9 mmHg. Left Atrium: Left atrial size was normal in size. Right Atrium: Right atrial size was moderately dilated. Pericardium: There is no evidence of pericardial effusion. Mitral Valve: The mitral valve is normal in structure. Mild mitral valve regurgitation. No evidence of mitral valve stenosis. Tricuspid  Valve: The tricuspid valve is normal in structure. Tricuspid valve regurgitation is mild . No evidence of tricuspid stenosis. Aortic Valve: The aortic valve is tricuspid. Aortic valve regurgitation is not visualized. No aortic stenosis is present. Pulmonic Valve: The pulmonic valve was normal in structure. Pulmonic valve regurgitation is mild. No evidence of pulmonic stenosis. Aorta: The aortic root is normal in size and structure. Venous: The  inferior vena cava is normal in size with greater than 50% respiratory variability, suggesting right atrial pressure of 3 mmHg. IAS/Shunts: No atrial level shunt detected by color flow Doppler.  LEFT VENTRICLE PLAX 2D LVIDd:         4.80 cm   Diastology LVIDs:         3.60 cm   LV e' medial:    6.85 cm/s LV PW:         1.20 cm   LV E/e' medial:  12.6 LV IVS:        1.00 cm   LV e' lateral:   12.10 cm/s LVOT diam:     2.30 cm   LV E/e' lateral: 7.1 LV SV:         67 LV SV Index:   40 LVOT Area:     4.15 cm  RIGHT VENTRICLE RV S prime:     11.90 cm/s TAPSE (M-mode): 1.5 cm LEFT ATRIUM           Index        RIGHT ATRIUM           Index LA diam:      3.30 cm 1.94 cm/m   RA Area:     25.00 cm LA Vol (A2C): 60.1 ml 35.28 ml/m  RA Volume:   82.30 ml  48.32 ml/m LA Vol (A4C): 56.1 ml 32.94 ml/m  AORTIC VALVE LVOT Vmax:   76.50 cm/s LVOT Vmean:  46.100 cm/s LVOT VTI:    0.162 m  AORTA Ao Root diam: 3.10 cm MITRAL VALVE               TRICUSPID VALVE MV Area (PHT): 2.95 cm    TR Peak grad:   33.9 mmHg MV Decel Time: 257 msec    TR Vmax:        291.00 cm/s MV E velocity: 86.50 cm/s MV A velocity: 56.60 cm/s  SHUNTS MV E/A ratio:  1.53        Systemic VTI:  0.16 m                            Systemic Diam: 2.30 cm Weston Brass MD Electronically signed by Weston Brass MD Signature Date/Time: 08/11/2021/10:52:03 AM    Final     Microbiology: Recent Results (from the past 240 hour(s))  Resp Panel by RT-PCR (Flu A&B, Covid) Nasopharyngeal Swab     Status: None   Collection Time: 08/08/21  3:48 PM   Specimen: Nasopharyngeal Swab; Nasopharyngeal(NP) swabs in vial transport medium  Result Value Ref Range Status   SARS Coronavirus 2 by RT PCR NEGATIVE NEGATIVE Final    Comment: (NOTE) SARS-CoV-2 target nucleic acids are NOT DETECTED.  The SARS-CoV-2 RNA is generally detectable in upper respiratory specimens during the acute phase of infection. The lowest concentration of SARS-CoV-2 viral copies this assay can  detect is 138 copies/mL. A negative result does not preclude SARS-Cov-2 infection and should not be used as the sole basis for treatment or other patient management decisions.  A negative result may occur with  improper specimen collection/handling, submission of specimen other than nasopharyngeal swab, presence of viral mutation(s) within the areas targeted by this assay, and inadequate number of viral copies(<138 copies/mL). A negative result must be combined with clinical observations, patient history, and epidemiological information. The expected result is Negative.  Fact Sheet for Patients:  BloggerCourse.com  Fact Sheet for Healthcare Providers:  SeriousBroker.it  This test is no t yet approved or cleared by the [place] FDA and  has been authorized for detection and/or diagnosis of SARS-CoV-2 by FDA under an Emergency Use Authorization (EUA). This EUA will remain  in effect (meaning this test can be used) for the duration of the COVID-19 declaration under Section 564(b)(1) of the Act, 21 U.S.C.section 360bbb-3(b)(1), unless the authorization is terminated  or revoked sooner.       Influenza A by PCR NEGATIVE NEGATIVE Final   Influenza B by PCR NEGATIVE NEGATIVE Final    Comment: (NOTE) The Xpert Xpress SARS-CoV-2/FLU/RSV plus assay is intended as an aid in the diagnosis of influenza from Nasopharyngeal swab specimens and should not be used as a sole basis for treatment. Nasal washings and aspirates are unacceptable for Xpert Xpress SARS-CoV-2/FLU/RSV testing.  Fact Sheet for Patients: BloggerCourse.com  Fact Sheet for Healthcare Providers: SeriousBroker.it  This test is not yet approved or cleared by the [place] FDA and has been authorized for detection and/or diagnosis of SARS-CoV-2 by FDA under an Emergency Use Authorization (EUA). This EUA will remain in  effect (meaning this test can be used) for the duration of the COVID-19 declaration under Section 564(b)(1) of the Act, 21 U.S.C. section 360bbb-3(b)(1), unless the authorization is terminated or revoked.  Performed at [facility], [address] [place], [place] [address]   Respiratory (~20 pathogens) panel by PCR     Status: Abnormal   Collection Time: 08/08/21  3:48 PM   Specimen: Nasopharyngeal Swab; Respiratory  Result Value Ref Range Status   Adenovirus NOT DETECTED NOT DETECTED Final   Coronavirus 229E NOT DETECTED NOT DETECTED Final    Comment: (NOTE) The Coronavirus on the Respiratory Panel, DOES NOT test for the novel  Coronavirus (2019 nCoV)    Coronavirus HKU1 NOT DETECTED NOT DETECTED Final   Coronavirus NL63 NOT DETECTED NOT DETECTED Final   Coronavirus OC43 NOT DETECTED NOT DETECTED Final   Metapneumovirus NOT DETECTED NOT DETECTED Final   Rhinovirus / Enterovirus NOT DETECTED NOT DETECTED Final   Influenza A NOT DETECTED NOT DETECTED Final   Influenza B NOT DETECTED NOT DETECTED Final   Parainfluenza Virus 1 NOT DETECTED NOT DETECTED Final   Parainfluenza Virus 2 NOT DETECTED NOT DETECTED Final   Parainfluenza Virus 3 NOT DETECTED NOT DETECTED Final   Parainfluenza Virus 4 NOT DETECTED NOT DETECTED Final   Respiratory Syncytial Virus DETECTED (A) NOT DETECTED Final   Bordetella pertussis NOT DETECTED NOT DETECTED Final   Bordetella Parapertussis NOT DETECTED NOT DETECTED Final   Chlamydophila pneumoniae NOT DETECTED NOT DETECTED Final   Mycoplasma pneumoniae NOT DETECTED NOT DETECTED Final    Comment: Performed at [facility], [address], [place], [place] [address]  Culture, blood (routine x 2)     Status: None (Preliminary result)   Collection Time: 08/08/21  5:49 PM   Specimen: BLOOD RIGHT FOREARM  Result Value Ref Range Status   Specimen Description BLOOD RIGHT FOREARM  Final   Special Requests   Final    BOTTLES DRAWN  AEROBIC AND  ANAEROBIC Blood Culture results may not be optimal due to an inadequate volume of blood received in culture bottles   Culture   Final    NO GROWTH 4 DAYS Performed at [facility], [address] [place], [place] [address]    Report Status PENDING  Incomplete  Culture, blood (routine x 2)     Status: None (Preliminary result)   Collection Time: 08/08/21  5:52 PM   Specimen: BLOOD LEFT FOREARM  Result Value Ref Range Status   Specimen Description BLOOD LEFT FOREARM  Final   Special Requests   Final    BOTTLES DRAWN AEROBIC AND ANAEROBIC Blood Culture results may not be optimal due to an inadequate volume of blood received in culture bottles   Culture   Final    NO GROWTH 4 DAYS Performed at [facility], [address], [place], [place] [address]    Report Status PENDING  Incomplete     Labs: Basic Metabolic Panel: Recent Labs  Lab 08/08/21 1543 08/08/21 2003 08/09/21 0318 08/11/21 0256  NA 132*  --  135  --   K 3.4*  --  3.9  --   CL 95*  --  102  --   CO2 25  --  26  --   GLUCOSE 133*  --  157*  --   BUN 13  --  14  --   CREATININE 1.13*  --  1.03*  --   CALCIUM 8.6*  --  8.6*  --   MG  --  1.7  --  2.7*   Liver Function Tests: Recent Labs  Lab 08/08/21 1543  AST 25  ALT 12  ALKPHOS 89  BILITOT 0.7  PROT 7.0  ALBUMIN 3.6   No results for input(s): LIPASE, AMYLASE in the last 168 hours. No results for input(s): AMMONIA in the last 168 hours. CBC: Recent Labs  Lab 08/08/21 1543 08/09/21 0318  WBC 4.7 3.3*  NEUTROABS 3.8  --   HGB 13.4 11.3*  HCT 38.5 33.8*  MCV 104.1* 105.0*  PLT 125* 124*   Cardiac Enzymes: No results for input(s): CKTOTAL, CKMB, CKMBINDEX, TROPONINI in the last 168 hours. BNP: BNP (last 3 results) Recent Labs    08/08/21 1543  BNP 876.6*    ProBNP (last 3 results) No results for input(s): PROBNP in the last 8760 hours.  CBG: No results for input(s): GLUCAP in the last 168 hours.     Signed:  Darlin Drop, MD [facility] 08/12/2021, 5:30 PM

## 2021-08-12 NOTE — Telephone Encounter (Signed)
Error

## 2021-08-12 NOTE — TOC Transition Note (Signed)
Transition of Care [facility]) - CM/SW Discharge Note   Patient Details  Name: Monica Zamora MRN: [id number] Date of Birth: 1950/10/14  [facility]) CM/SW Contact:  Leone Haven, RN Phone Number: 08/12/2021, 1:30 PM   Clinical Narrative:    NCM spoke with patient at bedside this am, asked where she would like to get home , she states she does not have a preference.  NCM made referral to [facility] with [employer].  The concentrator will be set up at the home and the tank will be brought to patient room.     Final next level of care: Home/Self Care Barriers to Discharge: No Barriers Identified   Patient Goals and CMS Choice Patient states their goals for this hospitalization and ongoing recovery are:: return home      Discharge Placement                       Discharge Plan and Services                DME Arranged: Oxygen DME Agency: [employer] Date DME Agency Contacted: 08/12/21 Time DME Agency Contacted: 1330 Representative spoke with at DME Agency: Ian Malkin            Social Determinants of Health (SDOH) Interventions     Readmission Risk Interventions No flowsheet data found.

## 2021-08-13 ENCOUNTER — Telehealth: Payer: Self-pay

## 2021-08-13 LAB — CULTURE, BLOOD (ROUTINE X 2)
Culture: NO GROWTH
Culture: NO GROWTH

## 2021-08-13 NOTE — Telephone Encounter (Signed)
Transition Care Management Follow-up Telephone Call Date of discharge and from where: 08/12/2021, [facility] How have you been since you were released from the hospital? She said she is not feeling well, almost as bad as when she went into the hospital; but she denies the need to return to the hospital at this time. She explained that her O2 is not working correctly and she is waiting for a service tech from [employer] to contact her. She did state that she is feeling a little better this morning since taking her medications.  Any questions or concerns? No  Items Reviewed: Did the pt receive and understand the discharge instructions provided? Yes  Medications obtained and verified? Yes - she said she has all medications and did not have any questions about her med regime.  Other? No  Any new allergies since your discharge? No  Dietary orders reviewed? Yes Do you have support at home?  She lives alone.   Home Care and Equipment/Supplies: Were home health services ordered? no If so, what is the name of the agency? N/a  Has the agency set up a time to come to the patient's home? not applicable Were any new equipment or medical supplies ordered?  Yes: O2 What is the name of the medical supply agency? [facility] Were you able to get the supplies/equipment? yes Do you have any questions related to the use of the equipment or supplies? Yes: She has contacted [employer] and is waiting for a call from the technician.  She explained that her O2 concentrator is not working correctly.  She needed to turn it off during the night.  She does have spare tank to use when needed.   Functional Questionnaire: (I = Independent and D = Dependent) ADLs: independent    Follow up appointments reviewed:  PCP Hospital f/u appt confirmed?  She said she will call to schedule an appointment after she sees cardiology 08/15/2021.    Specialist Hospital f/u appt confirmed? Yes  Scheduled to see cardiology  on 08/15/2021. Are transportation arrangements needed? No  - she said he neighbor will driver her but she is also aware that [facility] Medicare and [place] Medicaid provide transportation free of charge to medical appointments.  If their condition worsens, is the pt aware to call PCP or go to the Emergency Dept.? Yes Was the patient provided with contact information for the PCP's office or ED? Yes Was to pt encouraged to call back with questions or concerns? Yes

## 2021-08-15 ENCOUNTER — Encounter: Payer: Self-pay | Admitting: General Practice

## 2021-08-15 ENCOUNTER — Other Ambulatory Visit: Payer: Self-pay

## 2021-08-15 ENCOUNTER — Ambulatory Visit (INDEPENDENT_AMBULATORY_CARE_PROVIDER_SITE_OTHER): Payer: Medicare Other | Admitting: General Practice

## 2021-08-15 ENCOUNTER — Telehealth: Payer: Self-pay | Admitting: Family Medicine

## 2021-08-15 VITALS — BP 128/76 | HR 67 | Ht 66.0 in | Wt 138.0 lb

## 2021-08-15 DIAGNOSIS — E7800 Pure hypercholesterolemia, unspecified: Secondary | ICD-10-CM

## 2021-08-15 DIAGNOSIS — Z79899 Other long term (current) drug therapy: Secondary | ICD-10-CM

## 2021-08-15 DIAGNOSIS — I25810 Atherosclerosis of coronary artery bypass graft(s) without angina pectoris: Secondary | ICD-10-CM | POA: Diagnosis not present

## 2021-08-15 DIAGNOSIS — I480 Paroxysmal atrial fibrillation: Secondary | ICD-10-CM

## 2021-08-15 DIAGNOSIS — I10 Essential (primary) hypertension: Secondary | ICD-10-CM

## 2021-08-15 NOTE — Telephone Encounter (Signed)
Pt states she was released from the Hospital and was told by her Cardiologist to follow up w/  her PCP to get an Oxygen bottle that hooks on to the tank (like a humidifier) and provided FAX NUMBER TO SEND RX to  [phone]  // back up  number is [phone].  Please advise and thank you

## 2021-08-15 NOTE — Patient Instructions (Signed)
Medication Instructions:  The current medical regimen is effective;  continue present plan and medications as directed. Please refer to the Current Medication list given to you today.   *If you need a refill on your cardiac medications before your next appointment, please call your pharmacy*  Lab Work: FASTING LIPID AND LFT IN 4-6 WEEKS If you have labs (blood work) drawn today and your tests are completely normal, you will receive your results only by:  MyChart Message (if you have MyChart) OR A paper copy in the mail.  If you have any lab test that is abnormal or we need to change your treatment, we will call you to review the results. You may go to any [facility] that is convenient for you however, we do have a lab in our office that is able to assist you. You DO NOT need an appointment for our lab. The lab is open 8:00am and closes at 4:00pm. Lunch 12:45 - 1:45pm.  Special Instructions DISCUSS WITH PRIMARY MD ANXIETY, USING A SSRI OR ATARAX, AND MAKE SURE TO ASK FOR A HUMIDIFIER FOR YOUR OXYGEN  PLEASE READ AND FOLLOW SALTY 6-ATTACHED-1,800 mg daily  PLEASE INCREASE PHYSICAL ACTIVITY AS TOLERATED  PLEASE READ AND FOLLOW INCREASED FIBER DIET-ATTACHED  Follow-Up: Your next appointment:  4-6 month(s) In Person with Olga Millers, MD  or Edd Fabian, FNP or Juanda Crumble, PA-C      At [facility], you and your health needs are our priority.  As part of our continuing mission to provide you with exceptional heart care, we have created designated Provider Care Teams.  These Care Teams include your primary Cardiologist (physician) and Advanced Practice Providers (APPs -  Physician Assistants and Nurse Practitioners) who all work together to provide you with the care you need, when you need it.         High-Fiber Eating Plan Fiber, also called dietary fiber, is a type of carbohydrate. It is found foods such as fruits, vegetables, whole grains, and beans. A high-fiber diet can have  many health benefits. Your health care provider may recommend a high-fiber diet to help: Prevent constipation. Fiber can make your bowel movements more regular. Lower your cholesterol. Relieve the following conditions: Inflammation of veins in the anus (hemorrhoids). Inflammation of specific areas of the digestive tract (uncomplicated diverticulosis). A problem of the large intestine, also called the colon, that sometimes causes pain and diarrhea (irritable bowel syndrome, or IBS). Prevent overeating as part of a weight-loss plan. Prevent heart disease, type 2 diabetes, and certain cancers. What are tips for following this plan? Reading food labels  Check the nutrition facts label on food products for the amount of dietary fiber. Choose foods that have 5 grams of fiber or more per serving. The goals for recommended daily fiber intake include: Men (age 47 or younger): 34-38 g. Men (over age 85): 28-34 g. Women (age 54 or younger): 25-28 g. Women (over age 70): 22-25 g. Your daily fiber goal is _____________ g. Shopping Choose whole fruits and vegetables instead of processed forms, such as apple juice or applesauce. Choose a wide variety of high-fiber foods such as avocados, lentils, oats, and kidney beans. Read the nutrition facts label of the foods you choose. Be aware of foods with added fiber. These foods often have high sugar and sodium amounts per serving. Cooking Use whole-grain flour for baking and cooking. Cook with brown rice instead of white rice. Meal planning Start the day with a breakfast that is high in  fiber, such as a cereal that contains 5 g of fiber or more per serving. Eat breads and cereals that are made with whole-grain flour instead of refined flour or white flour. Eat brown rice, bulgur wheat, or millet instead of white rice. Use beans in place of meat in soups, salads, and pasta dishes. Be sure that half of the grains you eat each day are whole grains. General  information You can get the recommended daily intake of dietary fiber by: Eating a variety of fruits, vegetables, grains, nuts, and beans. Taking a fiber supplement if you are not able to take in enough fiber in your diet. It is better to get fiber through food than from a supplement. Gradually increase how much fiber you consume. If you increase your intake of dietary fiber too quickly, you may have bloating, cramping, or gas. Drink plenty of water to help you digest fiber. Choose high-fiber snacks, such as berries, raw vegetables, nuts, and popcorn. What foods should I eat? Fruits Berries. Pears. Apples. Oranges. Avocado. Prunes and raisins. Dried figs. Vegetables Sweet potatoes. Spinach. Kale. Artichokes. Cabbage. Broccoli. Cauliflower. Green peas. Carrots. Squash. Grains Whole-grain breads. Multigrain cereal. Oats and oatmeal. Brown rice. Barley. Bulgur wheat. Millet. Quinoa. Bran muffins. Popcorn. Rye wafer crackers. Meats and other proteins Navy beans, kidney beans, and pinto beans. Soybeans. Split peas. Lentils. Nuts and seeds. Dairy Fiber-fortified yogurt. Beverages Fiber-fortified soy milk. Fiber-fortified orange juice. Other foods Fiber bars. The items listed above may not be a complete list of recommended foods and beverages. Contact a dietitian for more information. What foods should I avoid? Fruits Fruit juice. Cooked, strained fruit. Vegetables Fried potatoes. Canned vegetables. Well-cooked vegetables. Grains White bread. Pasta made with refined flour. White rice. Meats and other proteins Fatty cuts of meat. Fried chicken or fried fish. Dairy Milk. Yogurt. Cream cheese. Sour cream. Fats and oils Butters. Beverages Soft drinks. Other foods Cakes and pastries. The items listed above may not be a complete list of foods and beverages to avoid. Talk with your dietitian about what choices are best for you. Summary Fiber is a type of carbohydrate. It is found in foods  such as fruits, vegetables, whole grains, and beans. A high-fiber diet has many benefits. It can help to prevent constipation, lower blood cholesterol, aid weight loss, and reduce your risk of heart disease, diabetes, and certain cancers. Increase your intake of fiber gradually. Increasing fiber too quickly may cause cramping, bloating, and gas. Drink plenty of water while you increase the amount of fiber you consume. The best sources of fiber include whole fruits and vegetables, whole grains, nuts, seeds, and beans. This information is not intended to replace advice given to you by your health care provider. Make sure you discuss any questions you have with your health care provider. Document Revised: 01/05/2020 Document Reviewed: 01/05/2020 Elsevier Patient Education  2022 [employer].

## 2021-08-16 ENCOUNTER — Telehealth: Payer: Self-pay | Admitting: Cardiology

## 2021-08-16 NOTE — Telephone Encounter (Signed)
Called patient back- she seen Monica Zamora yesterday who recommended she see PCP for the humidifier for her home oxygen. Patient did call her PCP and they want her to come in to be seen, patient states she just seen her in October and she has difficulty getting to and from appointments. Patient is requesting if using a plain humidifier would be okay, I did discuss the difference in the two types. She would like to get the one that goes to her oxygen tank, but she just can not go see her PCP to get the order. She is requesting this be sent to [facility] to review.   Will route to MD to get his recommendations.

## 2021-08-16 NOTE — Telephone Encounter (Signed)
Pt is stating she just got out of the hospital and is needing for her PCP to give her an rx so that she is able to get a humidifier pt says that pcp is wanting to see her first and she doesn't want to have to go through that, pt would like to know if she is able to just purchase one from [place]... please advise

## 2021-08-19 NOTE — Telephone Encounter (Signed)
Spoke with pt, Aware of dr crenshaw's recommendations.  °

## 2021-08-21 ENCOUNTER — Telehealth: Payer: Self-pay | Admitting: Cardiology

## 2021-08-21 NOTE — Telephone Encounter (Signed)
  PT would like to know how long she needs to remain on oxygen and how long she should use it.Marland Kitchen

## 2021-08-21 NOTE — Telephone Encounter (Signed)
Spoke with pt, Aware of dr Ludwig Clarks recommendations. Ask her to check her saturation when she is walking around without oxygen on and if it is above 92% then she will not need to wear the oxygen. Pt agreed with this plan.

## 2021-08-21 NOTE — Telephone Encounter (Signed)
Spoke with patient of Dr. Jens Som - was discharged on home O2 (2L) after RSV She saw Verdon Cummins NP last week for follow up  Home O2 is 97% on supplemental O2 (after 30-42mins of use) Lowest home reading on RA is 93-95% She is not using O2 at night, b/c it has been drying her out and she would cough She supplements with O2 about once daily and then she is good rest of day HR 80s  She would like to know how long she needs O2 therapy Unsure who would be the provider to d/c  Routed to MD/RN

## 2021-08-29 ENCOUNTER — Ambulatory Visit (HOSPITAL_COMMUNITY): Payer: Self-pay | Admitting: Licensed Clinical Social Worker

## 2021-09-19 ENCOUNTER — Ambulatory Visit: Payer: Medicare Other | Admitting: Physician Assistant

## 2021-09-19 ENCOUNTER — Ambulatory Visit (HOSPITAL_COMMUNITY): Payer: Self-pay | Admitting: Physician Assistant

## 2021-12-02 NOTE — Progress Notes (Deleted)
? ? ? ? ?HPI: FU coronary artery disease and hypertension.  Patient is status post coronary artery bypass and graft in 2021.  All records are not available.  Echocardiogram November 2022 showed normal LV function, mild left ventricular hypertrophy, moderate right atrial enlargement, mild mitral regurgitation.  Since last seen,  ? ?Current Outpatient Medications  ?Medication Sig Dispense Refill  ? acetaminophen (TYLENOL) 500 MG tablet Take 1,000 mg by mouth 2 (two) times daily as needed (pain).    ? albuterol (VENTOLIN HFA) 108 (90 Base) MCG/ACT inhaler Inhale 1 puff into the lungs 2 (two) times daily as needed for wheezing or shortness of breath.    ? ALPRAZolam (XANAX) 0.5 MG tablet Take 1 tablet (0.5 mg total) by mouth at bedtime as needed for anxiety. (Patient taking differently: Take 0.5 mg by mouth 2 (two) times daily.) 30 tablet 0  ? apixaban (ELIQUIS) 5 MG TABS tablet Take 5 mg by mouth 2 (two) times daily.    ? aspirin 81 MG chewable tablet Chew 81 mg by mouth daily.    ? dronedarone (MULTAQ) 400 MG tablet Take 200 mg by mouth 2 (two) times daily.    ? furosemide (LASIX) 40 MG tablet Take 1 tablet (40 mg total) by mouth daily. 30 tablet 2  ? metoprolol tartrate (LOPRESSOR) 25 MG tablet Take 12.5 mg by mouth 2 (two) times daily.    ? nicotine (NICODERM CQ - DOSED IN MG/24 HOURS) 14 mg/24hr patch Place 1 patch (14 mg total) onto the skin daily. 28 patch 0  ? potassium chloride SA (KLOR-CON) 20 MEQ tablet Take 20 mEq by mouth daily.    ? pravastatin (PRAVACHOL) 20 MG tablet Take 1 tablet (20 mg total) by mouth daily at 6 PM. 30 tablet 0  ? ?No current facility-administered medications for this visit.  ? ? ? ?Past Medical History:  ?Diagnosis Date  ? A-fib (HCC)   ? COPD (chronic obstructive pulmonary disease) (HCC)   ? Coronary artery disease   ? Hx of CABG   ? Hypertension   ? PUD (peptic ulcer disease)   ? ? ?Past Surgical History:  ?Procedure Laterality Date  ? ABDOMINAL HYSTERECTOMY    ? CHOLECYSTECTOMY     ? CORONARY ARTERY BYPASS GRAFT    ? ruptured disc    ? SPINE SURGERY    ? ? ?Social History  ? ?Socioeconomic History  ? Marital status: Divorced  ?  Spouse name: Not on file  ? Number of children: 2  ? Years of education: Not on file  ? Highest education level: Not on file  ?Occupational History  ? Not on file  ?Tobacco Use  ? Smoking status: Former  ?  Packs/day: 0.25  ?  Types: Cigarettes  ?  Quit date: 08/05/2021  ?  Years since quitting: 0.3  ? Smokeless tobacco: Never  ?Substance and Sexual Activity  ? Alcohol use: Yes  ?  Comment: Occasional  ? Drug use: Never  ? Sexual activity: Not on file  ?Other Topics Concern  ? Not on file  ?Social History Narrative  ? Not on file  ? ?Social Determinants of Health  ? ?Financial Resource Strain: Not on file  ?Food Insecurity: Not on file  ?Transportation Needs: Not on file  ?Physical Activity: Not on file  ?Stress: Not on file  ?Social Connections: Not on file  ?Intimate Partner Violence: Not on file  ? ? ?Family History  ?Problem Relation Age of Onset  ? Lupus Mother   ? ? ?  ROS: no fevers or chills, productive cough, hemoptysis, dysphasia, odynophagia, melena, hematochezia, dysuria, hematuria, rash, seizure activity, orthopnea, PND, pedal edema, claudication. Remaining systems are negative. ? ?Physical Exam: ?Well-developed well-nourished in no acute distress.  ?Skin is warm and dry.  ?HEENT is normal.  ?Neck is supple.  ?Chest is clear to auscultation with normal expansion.  ?Cardiovascular exam is regular rate and rhythm.  ?Abdominal exam nontender or distended. No masses palpated. ?Extremities show no edema. ?neuro grossly intact ? ?ECG- personally reviewed ? ?A/P ? ?1 coronary artery disease status post coronary artery bypass and graft-continue statin.  No aspirin given need for anticoagulation. ? ?2 hypertension-blood pressure controlled today.  Continue present medications. ? ?3 hyperlipidemia-continue statin. ? ?4 history of paroxysmal atrial  fibrillation-patient remains in sinus rhythm.  Continue apixaban and dronedarone. ? ?5 history of carotid artery disease-arrange follow-up carotid Dopplers. ? ?6 tobacco abuse-has been counseled continue. ? ?Olga Millers, MD ? ? ? ?

## 2021-12-16 ENCOUNTER — Ambulatory Visit: Payer: Medicare Other | Admitting: Cardiology

## 2021-12-23 ENCOUNTER — Encounter: Payer: Self-pay | Admitting: Cardiology

## 2022-04-28 NOTE — Progress Notes (Deleted)
HPI: FU CAD and PAF.  Patient is status post coronary artery bypass and graft in 2021.  All records are not available.  Echocardiogram November 2022 showed normal LV function, mild left ventricular hypertrophy, moderate right atrial enlargement, mild mitral regurgitation.  Since last seen,   Current Outpatient Medications  Medication Sig Dispense Refill   acetaminophen (TYLENOL) 500 MG tablet Take 1,000 mg by mouth 2 (two) times daily as needed (pain).     albuterol (VENTOLIN HFA) 108 (90 Base) MCG/ACT inhaler Inhale 1 puff into the lungs 2 (two) times daily as needed for wheezing or shortness of breath.     ALPRAZolam (XANAX) 0.5 MG tablet Take 1 tablet (0.5 mg total) by mouth at bedtime as needed for anxiety. (Patient taking differently: Take 0.5 mg by mouth 2 (two) times daily.) 30 tablet 0   apixaban (ELIQUIS) 5 MG TABS tablet Take 5 mg by mouth 2 (two) times daily.     aspirin 81 MG chewable tablet Chew 81 mg by mouth daily.     dronedarone (MULTAQ) 400 MG tablet Take 200 mg by mouth 2 (two) times daily.     furosemide (LASIX) 40 MG tablet Take 1 tablet (40 mg total) by mouth daily. 30 tablet 2   metoprolol tartrate (LOPRESSOR) 25 MG tablet Take 12.5 mg by mouth 2 (two) times daily.     nicotine (NICODERM CQ - DOSED IN MG/24 HOURS) 14 mg/24hr patch Place 1 patch (14 mg total) onto the skin daily. 28 patch 0   potassium chloride SA (KLOR-CON) 20 MEQ tablet Take 20 mEq by mouth daily.     pravastatin (PRAVACHOL) 20 MG tablet Take 1 tablet (20 mg total) by mouth daily at 6 PM. 30 tablet 0   No current facility-administered medications for this visit.     Past Medical History:  Diagnosis Date   A-fib (HCC)    COPD (chronic obstructive pulmonary disease) (HCC)    Coronary artery disease    Hx of CABG    Hypertension    PUD (peptic ulcer disease)     Past Surgical History:  Procedure Laterality Date   ABDOMINAL HYSTERECTOMY     CHOLECYSTECTOMY     CORONARY ARTERY BYPASS  GRAFT     ruptured disc     SPINE SURGERY      Social History   Socioeconomic History   Marital status: Divorced    Spouse name: Not on file   Number of children: 2   Years of education: Not on file   Highest education level: Not on file  Occupational History   Not on file  Tobacco Use   Smoking status: Former    Packs/day: 0.25    Types: Cigarettes    Quit date: 08/05/2021    Years since quitting: 0.7   Smokeless tobacco: Never  Substance and Sexual Activity   Alcohol use: Yes    Comment: Occasional   Drug use: Never   Sexual activity: Not on file  Other Topics Concern   Not on file  Social History Narrative   Not on file   Social Determinants of Health   Financial Resource Strain: Not on file  Food Insecurity: Not on file  Transportation Needs: Not on file  Physical Activity: Not on file  Stress: Not on file  Social Connections: Not on file  Intimate Partner Violence: Not on file    Family History  Problem Relation Age of Onset   Lupus Mother  ROS: no fevers or chills, productive cough, hemoptysis, dysphasia, odynophagia, melena, hematochezia, dysuria, hematuria, rash, seizure activity, orthopnea, PND, pedal edema, claudication. Remaining systems are negative.  Physical Exam: Well-developed well-nourished in no acute distress.  Skin is warm and dry.  HEENT is normal.  Neck is supple.  Chest is clear to auscultation with normal expansion.  Cardiovascular exam is regular rate and rhythm.  Abdominal exam nontender or distended. No masses palpated. Extremities show no edema. neuro grossly intact  ECG- personally reviewed  A/P  1 coronary artery disease status post coronary artery bypass graft-plan to continue statin.  She is not on aspirin given need for apixaban.  2 paroxysmal atrial fibrillation-continue dronedarone.  Continue apixaban.  She remains in sinus.  3 hypertension-blood pressure controlled.  Continue present medical regimen.  4  hyperlipidemia-continue Crestor at present dose.  5 history of carotid artery disease-repeat carotid Dopplers.  6 tobacco abuse-patient counseled discontinuing.  Olga Millers, MD

## 2022-05-09 ENCOUNTER — Ambulatory Visit: Payer: Medicare Other | Admitting: Cardiology

## 2022-09-05 IMAGING — DX DG CHEST 1V PORT
1 series · 1 of 1 positions shown · non-contrast
Comparison: None.

CLINICAL DATA: 70-year-old female with shortness of breath and
chest pain

EXAM:
PORTABLE CHEST 1 VIEW

[chest]
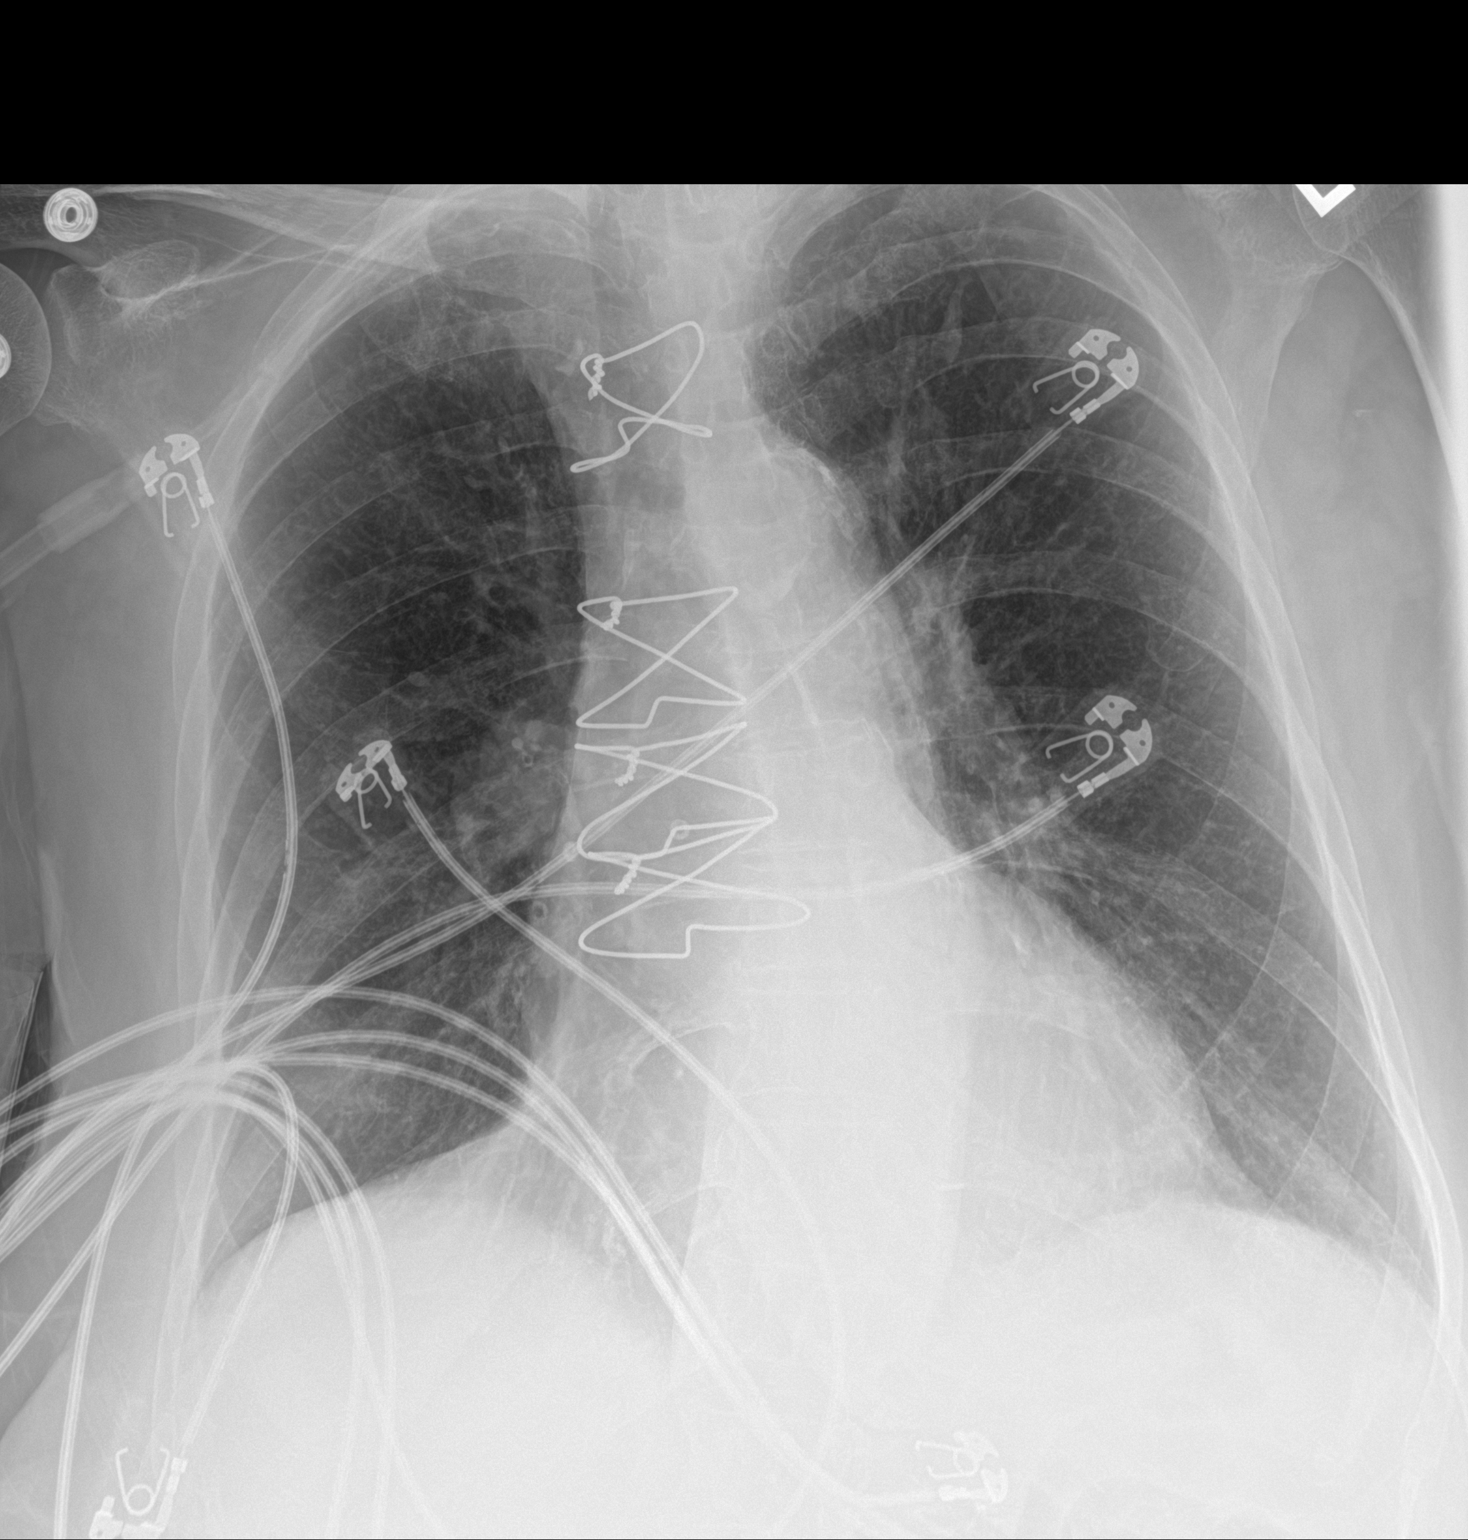

[1 of 1 positions shown; findings below may reference images not displayed]

FINDINGS: Cardiomegaly and median sternotomy noted.

RIGHT basilar atelectasis/scarring noted.

There is no evidence of focal airspace disease, pulmonary edema,
suspicious pulmonary nodule/mass, pleural effusion, or pneumothorax.

No acute bony abnormalities are identified.
IMPRESSION: Cardiomegaly with RIGHT basilar atelectasis/scarring.

## 2022-09-12 ENCOUNTER — Other Ambulatory Visit: Payer: Self-pay | Admitting: Family Medicine

## 2022-09-27 ENCOUNTER — Ambulatory Visit
Admission: EM | Admit: 2022-09-27 | Discharge: 2022-09-27 | Disposition: A | Discharge status: Home / Self Care | Payer: 59

## 2022-09-27 ENCOUNTER — Ambulatory Visit (INDEPENDENT_AMBULATORY_CARE_PROVIDER_SITE_OTHER): Payer: 59

## 2022-09-27 DIAGNOSIS — M25562 Pain in left knee: Secondary | ICD-10-CM | POA: Diagnosis not present

## 2022-09-27 NOTE — ED Provider Notes (Signed)
[name]    CSN: [id number] Arrival date & time: 09/27/22  1315      History   Chief Complaint Chief Complaint  Patient presents with   l knee pain     HPI Monica Zamora is a 72 y.o. female.   Patient presents with left knee pain and swelling that has been present for 3 weeks.  Patient reports that she thinks she twisted it wrong in her bed because when she got up to get out of her bed, it was painful.  Denies numbness or tingling.  She does report bearing weight causes pain.  Has taken Tylenol with minimal improvement.  Has not seen a healthcare provider since it started.      Past Medical History:  Diagnosis Date   A-fib [facility])    COPD (chronic obstructive pulmonary disease) ([place])    Coronary artery disease    Hx of CABG    Hypertension    PUD (peptic ulcer disease)     Patient Active Problem List   Diagnosis Date Noted   COPD exacerbation ([place]) 08/09/2021   AF (paroxysmal atrial fibrillation) ([place]) 08/08/2021   COPD with acute exacerbation ([place]) 08/08/2021   Hypokalemia 08/08/2021   CAD (coronary artery disease) 08/08/2021   Essential hypertension 08/08/2021   Hyperlipidemia 08/08/2021   Elevated troponin 08/08/2021   Acute respiratory failure with hypoxemia ([place]) 08/08/2021   Thrombocytopenia ([place]) 08/08/2021    Past Surgical History:  Procedure Laterality Date   ABDOMINAL HYSTERECTOMY     CHOLECYSTECTOMY     CORONARY ARTERY BYPASS GRAFT     ruptured disc     SPINE SURGERY      OB History   No obstetric history on file.      Home Medications    Prior to Admission medications   Medication Sig Start Date End Date Taking? Authorizing Provider  dronedarone (MULTAQ) 400 MG tablet Take 400 mg by mouth 2 (two) times daily with a meal. 05/15/22  Yes [provider]  sertraline (ZOLOFT) 100 MG tablet Take 100 mg by mouth. 08/28/22  Yes [provider]  acetaminophen (TYLENOL) 500 MG tablet Take 1,000 mg by mouth 2  (two) times daily as needed (pain).    [provider]  albuterol (VENTOLIN HFA) 108 (90 Base) MCG/ACT inhaler Inhale 1 puff into the lungs 2 (two) times daily as needed for wheezing or shortness of breath. 07/10/21   [provider]  ALPRAZolam Duanne Moron) 0.5 MG tablet Take 1 tablet (0.5 mg total) by mouth at bedtime as needed for anxiety. Patient not taking: Reported on 09/27/2022 07/29/21   Dorna Mai, MD  apixaban (ELIQUIS) 5 MG TABS tablet Take 5 mg by mouth 2 (two) times daily.    [provider]  aspirin 81 MG chewable tablet Chew 81 mg by mouth daily.    [provider]  dronedarone (MULTAQ) 400 MG tablet Take 200 mg by mouth 2 (two) times daily.    [provider]  furosemide (LASIX) 40 MG tablet Take 1 tablet (40 mg total) by mouth daily. 06/24/21   Dorna Mai, MD  metoprolol tartrate (LOPRESSOR) 25 MG tablet Take 12.5 mg by mouth 2 (two) times daily. 07/23/21   [provider]  nicotine (NICODERM CQ - DOSED IN MG/24 HOURS) 14 mg/24hr patch Place 1 patch (14 mg total) onto the skin daily. 08/13/21   Kayleen Memos, DO  potassium chloride SA (KLOR-CON) 20 MEQ tablet Take 20 mEq by mouth daily.  [provider]  pravastatin (PRAVACHOL) 20 MG tablet Take 1 tablet (20 mg total) by mouth daily at 6 PM. 08/12/21 09/11/21  Kayleen Memos, DO    Family History Family History  Problem Relation Age of Onset   Lupus Mother     Social History Social History   Tobacco Use   Smoking status: Former    Packs/day: 0.25    Types: Cigarettes    Quit date: 08/05/2021    Years since quitting: 1.1   Smokeless tobacco: Never  Substance Use Topics   Alcohol use: Yes    Comment: Occasional   Drug use: Never     Allergies   Patient has no known allergies.   Review of Systems Review of Systems Per HPI  Physical Exam Triage Vital Signs ED Triage Vitals  Enc Vitals Group     BP 09/27/22 1430 (!) 151/83     Pulse Rate  09/27/22 1430 70     Resp 09/27/22 1430 16     Temp 09/27/22 1430 (!) 97.3 F (36.3 C)     Temp Source 09/27/22 1430 Oral     SpO2 09/27/22 1430 95 %     Weight --      Height --      Head Circumference --      Peak Flow --      Pain Score 09/27/22 1428 10     Pain Loc --      Pain Edu? --      Excl. in [place]? --    No data found.  Updated Vital Signs BP (!) 151/83   Pulse 70   Temp (!) 97.3 F (36.3 C) (Oral)   Resp 16   SpO2 95%   Visual Acuity Right Eye Distance:   Left Eye Distance:   Bilateral Distance:    Right Eye Near:   Left Eye Near:    Bilateral Near:     Physical Exam Constitutional:      General: She is not in acute distress.    Appearance: Normal appearance. She is not toxic-appearing or diaphoretic.  HENT:     Head: Normocephalic and atraumatic.  Eyes:     Extraocular Movements: Extraocular movements intact.     Conjunctiva/sclera: Conjunctivae normal.  Pulmonary:     Effort: Pulmonary effort is normal.  Musculoskeletal:     Left knee: Swelling present. No crepitus. Tenderness present. No LCL laxity, MCL laxity, ACL laxity or PCL laxity.Normal alignment. Normal pulse.     Comments: Tenderness to palpation to medial anterior left knee as well as inferior anterior left knee.  Patient has swelling throughout anterior knee.  No discoloration, warmth, lacerations, abrasions noted.  Neurovascular intact.  Neurological:     General: No focal deficit present.     Mental Status: She is alert and oriented to person, place, and time. Mental status is at baseline.  Psychiatric:        Mood and Affect: Mood normal.        Behavior: Behavior normal.        Thought Content: Thought content normal.        Judgment: Judgment normal.      UC Treatments / Results  Labs (all labs ordered are listed, but only abnormal results are displayed) Labs Reviewed - No data to display  EKG   Radiology DG Knee Complete 4 Views Left  Result Date: 09/27/2022 CLINICAL  DATA:  Left knee pain EXAM: LEFT KNEE - COMPLETE 4+ VIEW  COMPARISON:  None Available. FINDINGS: Frontal, bilateral oblique, lateral views of the left knee are obtained. No acute fracture, subluxation, or dislocation. Mild joint space narrowing in the medial compartment consistent with osteoarthritis. Moderate joint effusion. Diffuse atherosclerosis. IMPRESSION: 1. Mild osteoarthritis. 2. Moderate suprapatellar effusion. 3. No acute bony abnormality. Electronically Signed   By: Sharlet Salina M.D.   On: 09/27/2022 15:19    Procedures Procedures (including critical care time)  Medications Ordered in UC Medications - No data to display  Initial Impression / Assessment and Plan / UC Course  I have reviewed the triage vital signs and the nursing notes.  Pertinent labs & imaging results that were available during my care of the patient were reviewed by me and considered in my medical decision making (see chart for details).     Left knee x-ray showing osteoarthritis and moderate joint effusion.  Will treat with supportive care, elevation of extremity, knee brace, ice application.  Knee brace applied in urgent care by clinical staff.  Patient advised not to sleep in this.  Limited options on pain management given patient's daily medications and health history.  Will avoid prednisone given interactions with daily medications and safety risk.  Patient unable to take NSAIDs given that she takes daily blood thinners and creatinine is mildly elevated.  Advised Tylenol as safe medication.  Patient to follow-up with provided contact information for orthopedist on Monday to schedule an appointment for further evaluation and management.  Discussed return precautions.  Patient verbalized understanding and was agreeable with plan. Final Clinical Impressions(s) / UC Diagnoses   Final diagnoses:  Acute pain of left knee     Discharge Instructions      Knee brace has been applied.  Apply ice application.   Follow-up with orthopedist for further evaluation and management.     ED Prescriptions   None    PDMP not reviewed this encounter.   Gustavus Bryant, [place] 09/28/22 [phone]

## 2022-09-27 NOTE — Discharge Instructions (Signed)
Knee brace has been applied.  Apply ice application.  Follow-up with orthopedist for further evaluation and management.

## 2022-09-27 NOTE — ED Triage Notes (Signed)
Pt presents to  uc with co of L knee pain and swelling after turning wrong in her bed 3 weeks go. Pt reports pain shoots down her leg. Pt reports she has been using tylenol and it helps mildly.

## 2022-10-15 ENCOUNTER — Other Ambulatory Visit: Payer: Self-pay | Admitting: Family Medicine

## 2022-10-16 NOTE — Telephone Encounter (Signed)
Requested medication (s) are due for refill today: yes  Requested medication (s) are on the active medication list: yes  Last refill:  06/24/21  Future visit scheduled: no  Notes to clinic:  Unable to refill per protocol, appointment needed.      Requested Prescriptions  Pending Prescriptions Disp Refills   furosemide (LASIX) 40 MG tablet [Pharmacy Med Name: FUROSEMIDE 40 MG TAB 40 Tablet] 30 tablet 2    Sig: Take 1 tablet (40 mg total) by mouth daily.     Cardiovascular:  Diuretics - Loop Failed - 10/15/2022  4:58 PM      Failed - K in normal range and within 180 days    Potassium  Date Value Ref Range Status  08/09/2021 3.9 3.5 - 5.1 mmol/L Final         Failed - Ca in normal range and within 180 days    Calcium  Date Value Ref Range Status  08/09/2021 8.6 (L) 8.9 - 10.3 mg/dL Final         Failed - Na in normal range and within 180 days    Sodium  Date Value Ref Range Status  08/09/2021 135 135 - 145 mmol/L Final         Failed - Cr in normal range and within 180 days    Creatinine, Ser  Date Value Ref Range Status  08/09/2021 1.03 (H) 0.44 - 1.00 mg/dL Final         Failed - Cl in normal range and within 180 days    Chloride  Date Value Ref Range Status  08/09/2021 102 98 - 111 mmol/L Final         Failed - Mg Level in normal range and within 180 days    Magnesium  Date Value Ref Range Status  08/11/2021 2.7 (H) 1.7 - 2.4 mg/dL Final    Comment:    Performed at [facility], [place] [address], [place], [place] [address]         Failed - Last BP in normal range    BP Readings from Last 1 Encounters:  09/27/22 (!) 151/83         Failed - Valid encounter within last 6 months    Recent Outpatient Visits           1 year ago Essential hypertension   [facility], MD   1 year ago Essential hypertension   [facility], MD

## 2023-01-15 DIAGNOSIS — J449 Chronic obstructive pulmonary disease, unspecified: Secondary | ICD-10-CM | POA: Diagnosis not present

## 2023-01-15 DIAGNOSIS — F331 Major depressive disorder, recurrent, moderate: Secondary | ICD-10-CM | POA: Diagnosis not present

## 2023-01-15 DIAGNOSIS — I10 Essential (primary) hypertension: Secondary | ICD-10-CM | POA: Diagnosis not present

## 2023-01-15 DIAGNOSIS — F411 Generalized anxiety disorder: Secondary | ICD-10-CM | POA: Diagnosis not present

## 2023-01-15 DIAGNOSIS — F410 Panic disorder [episodic paroxysmal anxiety] without agoraphobia: Secondary | ICD-10-CM | POA: Diagnosis not present

## 2023-01-15 DIAGNOSIS — I25810 Atherosclerosis of coronary artery bypass graft(s) without angina pectoris: Secondary | ICD-10-CM | POA: Diagnosis not present

## 2023-01-15 DIAGNOSIS — F5104 Psychophysiologic insomnia: Secondary | ICD-10-CM | POA: Diagnosis not present

## 2023-01-15 DIAGNOSIS — I480 Paroxysmal atrial fibrillation: Secondary | ICD-10-CM | POA: Diagnosis not present

## 2023-01-15 DIAGNOSIS — R0602 Shortness of breath: Secondary | ICD-10-CM | POA: Diagnosis not present

## 2023-01-16 DIAGNOSIS — I4821 Permanent atrial fibrillation: Secondary | ICD-10-CM | POA: Diagnosis not present

## 2023-01-16 DIAGNOSIS — R600 Localized edema: Secondary | ICD-10-CM | POA: Diagnosis not present

## 2023-01-16 DIAGNOSIS — I25810 Atherosclerosis of coronary artery bypass graft(s) without angina pectoris: Secondary | ICD-10-CM | POA: Diagnosis not present

## 2023-01-16 DIAGNOSIS — E538 Deficiency of other specified B group vitamins: Secondary | ICD-10-CM | POA: Diagnosis not present

## 2023-01-16 DIAGNOSIS — I10 Essential (primary) hypertension: Secondary | ICD-10-CM | POA: Diagnosis not present

## 2023-01-16 DIAGNOSIS — E785 Hyperlipidemia, unspecified: Secondary | ICD-10-CM | POA: Diagnosis not present

## 2023-01-16 DIAGNOSIS — Z79899 Other long term (current) drug therapy: Secondary | ICD-10-CM | POA: Diagnosis not present

## 2023-01-16 DIAGNOSIS — R252 Cramp and spasm: Secondary | ICD-10-CM | POA: Diagnosis not present

## 2023-01-16 DIAGNOSIS — J449 Chronic obstructive pulmonary disease, unspecified: Secondary | ICD-10-CM | POA: Diagnosis not present

## 2023-01-16 DIAGNOSIS — R0602 Shortness of breath: Secondary | ICD-10-CM | POA: Diagnosis not present

## 2023-01-23 ENCOUNTER — Telehealth: Payer: Self-pay | Admitting: Cardiology

## 2023-01-23 MED ORDER — FUROSEMIDE 20 MG PO TABS: 60.0000 mg | ORAL_TABLET | Freq: Every day | ORAL | 3 refills | Status: DC

## 2023-01-23 NOTE — Telephone Encounter (Signed)
Office is calling back, to see if we reach out to the patient. Please advise

## 2023-01-23 NOTE — Telephone Encounter (Signed)
Spoke with pt, Aware of dr crenshaw's recommendations.  Follow up scheduled  

## 2023-01-23 NOTE — Telephone Encounter (Signed)
STAT if HR is under 50 or over 120 (normal HR is 60-100 beats per minute)  What is your heart rate?    BP 152/78, HR 71 (5/3) BP 154/66, HR 64 (5/2)  Do you have a log of your heart rate readings (document readings)?   Do you have any other symptoms?  SOB and increased fluid retention.  ProBNT is 2359  Caller stated patient's labs show that she is in heart failure.  Caller stated they did a work up on last Friday an will be faxing information over.  Caller wants patient to be contacted directly to follow-up.

## 2023-01-23 NOTE — Telephone Encounter (Signed)
Follow Up:    Patient said she was returning a call from the nurse, she does not know who called,

## 2023-01-23 NOTE — Telephone Encounter (Signed)
Lm to call back ./cy 

## 2023-01-23 NOTE — Telephone Encounter (Signed)
Patient had work up with PCP.,  [facility].  Seen last Thursday by Seen  by PA Dyke Brackett due to fluid retention and then started with SOB and returned Friday.  EKG and labs completed. EKG stated non specific ST depression, abnormal atrial rhythm Abnormal P axis. Labs pro BNP 2359.  Will be faxing now.

## 2023-01-27 ENCOUNTER — Ambulatory Visit: Payer: Medicare HMO | Attending: Cardiology | Admitting: Cardiology

## 2023-01-27 ENCOUNTER — Encounter: Payer: Self-pay | Admitting: Cardiology

## 2023-01-27 VITALS — BP 130/78 | HR 81 | Ht 66.0 in | Wt 179.0 lb

## 2023-01-27 DIAGNOSIS — I6523 Occlusion and stenosis of bilateral carotid arteries: Secondary | ICD-10-CM | POA: Diagnosis not present

## 2023-01-27 DIAGNOSIS — R0602 Shortness of breath: Secondary | ICD-10-CM

## 2023-01-27 NOTE — Progress Notes (Signed)
HPI: FU coronary artery disease, CHF, atrial fibrillation and hypertension.  Patient is status post coronary artery bypass and graft in 2021.  All records are not available.  Echocardiogram November 2022 showed ejection fraction 50 to 55%, mild right ventricular enlargement, moderate right atrial enlargement, mild mitral regurgitation.  Also history of COPD.  Since last seen she has had worsening dyspnea on exertion.  She does not have orthopnea or PND but she did have pedal edema which has improved with higher dose Lasix.  She has not had syncope.  She denies chest pain.  Current Outpatient Medications  Medication Sig Dispense Refill   acetaminophen (TYLENOL) 500 MG tablet Take 1,000 mg by mouth 2 (two) times daily as needed (pain).     albuterol (VENTOLIN HFA) 108 (90 Base) MCG/ACT inhaler Inhale 1 puff into the lungs 2 (two) times daily as needed for wheezing or shortness of breath.     ALPRAZolam (XANAX) 0.5 MG tablet Take 1 tablet (0.5 mg total) by mouth at bedtime as needed for anxiety. 30 tablet 0   apixaban (ELIQUIS) 5 MG TABS tablet Take 5 mg by mouth 2 (two) times daily.     aspirin 81 MG chewable tablet Chew 81 mg by mouth daily.     dronedarone (MULTAQ) 400 MG tablet Take 200 mg by mouth 2 (two) times daily.     furosemide (LASIX) 20 MG tablet Take 3 tablets (60 mg total) by mouth daily. 270 tablet 3   metoprolol tartrate (LOPRESSOR) 25 MG tablet Take 12.5 mg by mouth 2 (two) times daily.     potassium chloride SA (KLOR-CON) 20 MEQ tablet Take 20 mEq by mouth daily.     sertraline (ZOLOFT) 100 MG tablet Take 100 mg by mouth.     dronedarone (MULTAQ) 400 MG tablet Take 400 mg by mouth 2 (two) times daily with a meal. (Patient not taking: Reported on 01/27/2023)     nicotine (NICODERM CQ - DOSED IN MG/24 HOURS) 14 mg/24hr patch Place 1 patch (14 mg total) onto the skin daily. (Patient not taking: Reported on 01/27/2023) 28 patch 0   pravastatin (PRAVACHOL) 20 MG tablet Take 1 tablet  (20 mg total) by mouth daily at 6 PM. 30 tablet 0   No current facility-administered medications for this visit.     Past Medical History:  Diagnosis Date   A-fib (HCC)    COPD (chronic obstructive pulmonary disease) (H[facility])    Coronary artery disease    Hx of CABG    Hypertension    PUD (peptic ulcer disease)     Past Surgical History:  Procedure Laterality Date   ABDOMINAL HYSTERECTOMY     CHOLECYSTECTOMY     CORONARY ARTERY BYPASS GRAFT     ruptured disc     SPINE SURGERY      Social History   Socioeconomic History   Marital status: Divorced    Spouse name: Not on file   Number of children: 2   Years of education: Not on file   Highest education level: Not on file  Occupational History   Not on file  Tobacco Use   Smoking status: Former    Packs/day: .25    Types: Cigarettes    Quit date: 08/05/2021    Years since quitting: 1.4   Smokeless tobacco: Never  Substance and Sexual Activity   Alcohol use: Yes    Comment: Occasional   Drug use: Never   Sexual activity: Not on file  Other Topics Concern   Not on file  Social History Narrative   Not on file   Social Determinants of Health   Financial Resource Strain: Not on file  Food Insecurity: Not on file  Transportation Needs: Not on file  Physical Activity: Not on file  Stress: Not on file  Social Connections: Not on file  Intimate Partner Violence: Not on file    Family History  Problem Relation Age of Onset   Lupus Mother     ROS: no fevers or chills, productive cough, hemoptysis, dysphasia, odynophagia, melena, hematochezia, dysuria, hematuria, rash, seizure activity, orthopnea, PND, pedal edema, claudication. Remaining systems are negative.  Physical Exam: Well-developed well-nourished in no acute distress.  Skin is warm and dry.  HEENT is normal.  Neck is supple.  Chest with diminished breath sounds throughout Cardiovascular exam is regular rate and rhythm.  Abdominal exam nontender or  distended. No masses palpated. Extremities show no edema. neuro grossly intact  ECG-normal sinus rhythm at a rate of 81, prolonged QT interval.  Personally reviewed  A/P  1 coronary artery disease status post coronary artery bypass graft-patient denies chest pain.  Continue statin.    2 history of paroxysmal atrial fibrillation-continue dronedarone and apixaban.  She remains in sinus rhythm.  3 hypertension-blood pressure controlled.  Continue present medications.  4 hyperlipidemia-continue statin.  5 history of carotid artery disease-we will arrange follow-up carotid Dopplers.  6 tobacco abuse-patient discontinued back in November.  7 congestive heart failure-there was a question of congestive heart failure from her primary care physician and apparently based on labs.  I do not have those results available.  She did have recent increased lower extremity edema and may have a component of right heart failure.  However there is no edema on exam today.  We will repeat echocardiogram.  Continue diuretic at present dose.  Olga Millers, MD

## 2023-01-27 NOTE — Patient Instructions (Signed)
  Testing/Procedures:  Your physician has requested that you have an echocardiogram. Echocardiography is a painless test that uses sound waves to create images of your heart. It provides your doctor with information about the size and shape of your heart and how well your heart's chambers and valves are working. This procedure takes approximately one hour. There are no restrictions for this procedure. Please do NOT wear cologne, perfume, aftershave, or lotions (deodorant is allowed). Please arrive 15 minutes prior to your appointment time. [address] [facility] [address]  Your physician has requested that you have a lexiscan myoview. For further information please visit [email or web address] Please follow instruction sheet, as given. [address] [facility]   Your physician has requested that you have a carotid duplex. This test is an ultrasound of the carotid arteries in your neck. It looks at blood flow through these arteries that supply the brain with blood. Allow one hour for this exam. There are no restrictions or special instructions. [facility]   Follow-Up: At [facility], you and your health needs are our priority.  As part of our continuing mission to provide you with exceptional heart care, we have created designated Provider Care Teams.  These Care Teams include your primary Cardiologist (physician) and Advanced Practice Providers (APPs -  Physician Assistants and Nurse Practitioners) who all work together to provide you with the care you need, when you need it.  We recommend signing up for the patient portal called "MyChart".  Sign up information is provided on this After Visit Summary.  MyChart is used to connect with patients for Virtual Visits (Telemedicine).  Patients are able to view lab/test results, encounter notes, upcoming appointments, etc.  Non-urgent messages can be sent to your provider as well.   To learn more about what you can do with MyChart, go to  [email or web address].    Your next appointment:   6 month(s)  Provider:   Olga Millers, MD

## 2023-01-28 LAB — BASIC METABOLIC PANEL
BUN/Creatinine Ratio: 11 — ABNORMAL LOW (ref 12–28)
BUN: 13 mg/dL (ref 8–27)
CO2: 24 mmol/L (ref 20–29)
Calcium: 8.6 mg/dL — ABNORMAL LOW (ref 8.7–10.3)
Chloride: 97 mmol/L (ref 96–106)
Creatinine, Ser: 1.15 mg/dL — ABNORMAL HIGH (ref 0.57–1.00)
Glucose: 93 mg/dL (ref 70–99)
Potassium: 3.8 mmol/L (ref 3.5–5.2)
Sodium: 138 mmol/L (ref 134–144)
eGFR: 51 mL/min/{1.73_m2} — ABNORMAL LOW (ref >59)

## 2023-01-29 ENCOUNTER — Encounter: Payer: Self-pay | Admitting: *Deleted

## 2023-02-26 ENCOUNTER — Telehealth (HOSPITAL_COMMUNITY): Payer: Self-pay

## 2023-02-26 NOTE — Telephone Encounter (Signed)
Attempted to contact the patient with instructions for her test. The voicemail is full. S.Safira Proffit EMT/CCT

## 2023-02-27 ENCOUNTER — Ambulatory Visit (INDEPENDENT_AMBULATORY_CARE_PROVIDER_SITE_OTHER): Payer: Medicare HMO | Admitting: Internal Medicine

## 2023-02-27 ENCOUNTER — Other Ambulatory Visit: Payer: Self-pay | Admitting: *Deleted

## 2023-02-27 ENCOUNTER — Encounter: Payer: Self-pay | Admitting: Internal Medicine

## 2023-02-27 ENCOUNTER — Telehealth: Payer: Self-pay | Admitting: Internal Medicine

## 2023-02-27 VITALS — BP 120/80 | HR 90 | Temp 97.8°F | Ht 66.0 in | Wt 179.4 lb

## 2023-02-27 DIAGNOSIS — I480 Paroxysmal atrial fibrillation: Secondary | ICD-10-CM | POA: Diagnosis not present

## 2023-02-27 DIAGNOSIS — J439 Emphysema, unspecified: Secondary | ICD-10-CM

## 2023-02-27 DIAGNOSIS — J4489 Other specified chronic obstructive pulmonary disease: Secondary | ICD-10-CM

## 2023-02-27 DIAGNOSIS — Z951 Presence of aortocoronary bypass graft: Secondary | ICD-10-CM

## 2023-02-27 DIAGNOSIS — Z87891 Personal history of nicotine dependence: Secondary | ICD-10-CM | POA: Diagnosis not present

## 2023-02-27 MED ORDER — ALBUTEROL SULFATE (2.5 MG/3ML) 0.083% IN NEBU: 2.5000 mg | INHALATION_SOLUTION | Freq: Four times a day (QID) | RESPIRATORY_TRACT | 12 refills | Status: DC | PRN

## 2023-02-27 MED ORDER — BREZTRI AEROSPHERE 160-9-4.8 MCG/ACT IN AERO: 2.0000 | INHALATION_SPRAY | Freq: Two times a day (BID) | RESPIRATORY_TRACT | 11 refills | Status: DC

## 2023-02-27 MED ORDER — LEVALBUTEROL HCL 1.25 MG/0.5ML IN NEBU: 1.2500 mg | INHALATION_SOLUTION | RESPIRATORY_TRACT | 12 refills | Status: DC | PRN

## 2023-02-27 NOTE — Progress Notes (Signed)
Monica Zamora    [id number]    [birth year]  Primary Care Physician:Wilson, Lauris Poag, MD  Referring Physician: Lewayne Bunting, MD [address] [place],  [place] [address] Reason for Consultation: shortness of breath Date of Consultation: 02/27/2023  Chief complaint:   Chief Complaint  Patient presents with   Consult    Has been more sob in the past month, very sob with any exertion.  Had RSV (2022).  Triple bypass in [place] in 2021.  Has a history of emphysema.     HPI: Monica Zamora is a 72 y.o. woman with past medical history of [place] as well as tobacco use disorder. She is here for new patient evaluation for shortness of breath which is progressing. She has been diagnosed with COPD (many years ago) and is on home oxygen 2LNC.   She has a rescue inhaler albuterol she uses 4-5 times/day. She is not on any maintenance therapy. Never has been.  Breathing has worsened over the last couple of months but the last time she could do everything she wanted was back in 2021. She did not do any cardiac rehab after bypass.   She denies recurrent pneumonia/bronchitis. She has just dyspnea and weakness.  Never had PFTs.   Ambulates with rollator. Uses oxygen concentrator at home prescribed by her cardiologist.   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    Social history:  Occupation: worked in an Programmer, systems.  Exposures: lives at home with a friend.  Smoking history: 54 pack year smoking history, quit 2023  Social History   Occupational History   Not on file  Tobacco Use   Smoking status: Former    Packs/day: 1.50    Years: 54.00    Additional pack years: 0.00    Total pack years: 81.00    Types: Cigarettes    Start date: 09/16/1967    Quit date: 08/05/2022    Years since quitting: 0.5   Smokeless tobacco: Never  Substance and Sexual Activity   Alcohol use: Yes    Comment: Occasional    Drug use: Never   Sexual activity: Not on file    Relevant family history:  Family History  Problem Relation Age of Onset   Lupus Mother     Past Medical History:  Diagnosis Date   A-fib (HCC)    COPD (chronic obstructive pulmonary disease) (HCC)    Coronary artery disease    Hx of CABG    Hypertension    PUD (peptic ulcer disease)     Past Surgical History:  Procedure Laterality Date   ABDOMINAL HYSTERECTOMY     CHOLECYSTECTOMY     CORONARY ARTERY BYPASS GRAFT     ruptured disc     SPINE SURGERY       Physical Exam: Blood pressure 120/80, pulse 90, temperature 97.8 F (36.6 C), temperature source Oral, height 5\' 6"  (1.676 m), weight 179 lb 6.4 oz (81.4 kg), SpO2 93 %. Gen:      No acute distress, thin, chronically ill in wheelchair ENT:  no nasal polyps, mucus membranes moist Lungs:    diminished, no wheezes or crackles CV:         Regular rate and rhythm; no murmurs, rubs, or gallops.  No pedal edema Abd:      + bowel sounds; soft, non-tender; no distension MSK: no acute synovitis of DIP  or PIP joints, no mechanics hands.  Skin:      Warm and dry; no rashes Neuro: normal speech, no focal facial asymmetry Psych: alert and oriented x3, normal mood and affect   Data Reviewed/Medical Decision Making:  Independent interpretation of tests: Imaging:  Review of patient's chest xray 07/2021 images revealed cardiomegaly, right basilar scarring. The patient's images have been independently reviewed by me.    PFTs:    Labs:  Lab Results  Component Value Date   NA 138 01/27/2023   K 3.8 01/27/2023   CO2 24 01/27/2023   GLUCOSE 93 01/27/2023   BUN 13 01/27/2023   CREATININE 1.15 (H) 01/27/2023   CALCIUM 8.6 (L) 01/27/2023   EGFR 51 (L) 01/27/2023   GFRNONAA 59 (L) 08/09/2021   Lab Results  Component Value Date   WBC 3.3 (L) 08/09/2021   HGB 11.3 (L) 08/09/2021   HCT 33.8 (L) 08/09/2021   MCV 105.0 (H) 08/09/2021   PLT 124 (L) 08/09/2021    Immunization  status:  Immunization History  Administered Date(s) Administered   Fluad Quad(high Dose 65+) 08/11/2021   PFIZER(Purple Top)SARS-COV-2 Vaccination 12/23/2019, 02/28/2020   PNEUMOCOCCAL CONJUGATE-20 08/11/2021   Pneumococcal Polysaccharide-23 08/11/2021     I reviewed prior external note(s) from cardiology  I reviewed the result(s) of the labs and imaging as noted above.   I have ordered PFT  Assessment:  COPD, stage unknown Paroxysmal atrial fibrillation CAD s/p CABG Chronic respiratory Failure   Plan/Recommendations: Signs consistent with progressive copd and emphysema. She is also severely deconditioned following her CABG.   Will obtain Full set of PFTs  Will start breztri inhaler 2 puffs twice a day. Gargle after use  We walked you around to qualify you for home oxygen and a portable oxygen concentrator. Requires 2.5L pulse. Will prescribe POC.   Start taking levalbuterol nebulizer treatments through the nebulizer machine.   Need for lung cancer screening - will refer today   Return to Care: Return in about 3 months (around 05/30/2023) for follow up with PFT and same day appointment.  Durel Salts, MD Pulmonary and Critical Care Medicine [facility] [phone]  CC: Jens Som, Madolyn Frieze, MD

## 2023-02-27 NOTE — Patient Instructions (Addendum)
Please schedule follow up scheduled with myself in 3 months.  If my schedule is not open yet, we will contact you with a reminder closer to that time. Please call [phone] if you haven't heard from [place] a month before.   Before your next visit I would like you to have: Full set of PFTs - will have you come early at next visit to do this.   Will start breztri inhaler 2 puffs twice a day. Gargle after use  We walked you around to qualify you for home oxygen and a portable oxygen concentrator.  Start taking levalbuterol nebulizer treatments through the nebulizer machine.   Because you are at increased risk for lung cancer I am referring you to the lung cancer screening program through our

## 2023-02-27 NOTE — Telephone Encounter (Signed)
Pharmacy wants to verify medication, They believe it should be xopenex 1.25 mg/3.95ml   levalbuterol (XOPENEX) 1.25 MG/0.5ML nebulizer solution

## 2023-03-03 ENCOUNTER — Telehealth (HOSPITAL_COMMUNITY): Payer: Self-pay | Admitting: Cardiology

## 2023-03-03 NOTE — Telephone Encounter (Signed)
Patient called and cancelled Myoview for 03/04/23. Patient does not wish to reschedule. Order will be removed from the [facility]. Thank you

## 2023-03-04 ENCOUNTER — Ambulatory Visit (HOSPITAL_COMMUNITY): Payer: Medicare HMO

## 2023-03-04 ENCOUNTER — Ambulatory Visit (HOSPITAL_BASED_OUTPATIENT_CLINIC_OR_DEPARTMENT_OTHER): Payer: Medicare HMO

## 2023-03-04 ENCOUNTER — Ambulatory Visit (HOSPITAL_COMMUNITY)
Admission: RE | Admit: 2023-03-04 | Discharge: 2023-03-04 | Disposition: A | Discharge status: Home / Self Care | Payer: Medicare HMO | Source: Ambulatory Visit | Attending: Cardiology | Admitting: Cardiology

## 2023-03-04 ENCOUNTER — Telehealth: Payer: Self-pay | Admitting: *Deleted

## 2023-03-04 DIAGNOSIS — R0602 Shortness of breath: Secondary | ICD-10-CM

## 2023-03-04 DIAGNOSIS — I6523 Occlusion and stenosis of bilateral carotid arteries: Secondary | ICD-10-CM | POA: Insufficient documentation

## 2023-03-04 LAB — ECHOCARDIOGRAM COMPLETE
Area-P 1/2: 5.89 cm2
MV M vel: 5.47 m/s
MV Peak grad: 119.7 mmHg
S' Lateral: 4.45 cm

## 2023-03-04 NOTE — Telephone Encounter (Signed)
Spoke with pt, aware of results. Follow up scheduled  

## 2023-03-04 NOTE — Telephone Encounter (Signed)
-----   Message from Lewayne Bunting, MD sent at 03/04/2023  1:06 PM EDT ----- LV function mildly reduced; moderate MR and TR; schedule fu ov with APP for med adjustment Olga Millers

## 2023-03-04 NOTE — Telephone Encounter (Signed)
Spoke to pharmacist at [facility] and answered her question about levalbuterol (XOPENEX) 1.25 MG/0.5ML nebulizer solution. Nothing further needed.

## 2023-03-10 NOTE — Progress Notes (Unsigned)
Cardiology Clinic Note   Patient Name: Monica Zamora Date of Encounter: 03/12/2023  Primary Care Provider:  Rebecka Apley, NP Primary Cardiologist:  Olga Millers, MD  Patient Profile    72 year old female with history of paroxysmal atrial fibrillation, hypertension, coronary artery disease status post coronary artery bypass grafting in 2021, carotid artery disease.  Most recent echocardiogram on 03/04/2023 revealed EF of 40 to 45%, global hypokinesis of the left ventricle, mildly dilated left atrium with right atrial size moderately dilated, moderate MR with aortic valve sclerosis/calcification noted without evidence of aortic stenosis.  Past Medical History    Past Medical History:  Diagnosis Date   A-fib (HCC)    COPD (chronic obstructive pulmonary disease) (HCC)    Coronary artery disease    Hx of CABG    Hypertension    PUD (peptic ulcer disease)    Past Surgical History:  Procedure Laterality Date   ABDOMINAL HYSTERECTOMY     CHOLECYSTECTOMY     CORONARY ARTERY BYPASS GRAFT     ruptured disc     SPINE SURGERY      Allergies  No Known Allergies  History of Present Illness    [name] comes today with her son with continued complaints of shortness of breath and lower extremity edema.  She has been taking the extra doses of the Lasix which were prescribed by Dr. Jens Som when seen last due to chronic lower extremity edema and shortness of breath.  Of note the patient is also being seen by pulmonology and should be on home oxygen and home nebulizer treatments which she has not received yet.  The patient was to be on oxygen via nasal cannula overnight and during the day as needed.  She states that her breathing status remains difficult, she is not able to do very much due to shortness of breath and remains fairly sedentary right now.  Home Medications    Current Outpatient Medications  Medication Sig Dispense Refill   acetaminophen (TYLENOL) 500 MG  tablet Take 1,000 mg by mouth 2 (two) times daily as needed (pain).     albuterol (VENTOLIN HFA) 108 (90 Base) MCG/ACT inhaler Inhale 1 puff into the lungs 2 (two) times daily as needed for wheezing or shortness of breath.     ALPRAZolam (XANAX) 0.5 MG tablet Take 1 tablet (0.5 mg total) by mouth at bedtime as needed for anxiety. 30 tablet 0   aspirin 81 MG chewable tablet Chew 81 mg by mouth daily.     dronedarone (MULTAQ) 400 MG tablet Take 400 mg by mouth 2 (two) times daily with a meal.     sertraline (ZOLOFT) 100 MG tablet Take 100 mg by mouth.     apixaban (ELIQUIS) 5 MG TABS tablet Take 1 tablet (5 mg total) by mouth 2 (two) times daily. 60 tablet 3   Budeson-Glycopyrrol-Formoterol (BREZTRI AEROSPHERE) 160-9-4.8 MCG/ACT AERO Inhale 2 puffs into the lungs in the morning and at bedtime. (Patient not taking: Reported on 03/12/2023) 10.7 g 11   furosemide (LASIX) 20 MG tablet Take 3 tablets (60 mg total) by mouth daily. 270 tablet 3   levalbuterol (XOPENEX) 1.25 MG/0.5ML nebulizer solution Take 1.25 mg by nebulization every 4 (four) hours as needed for wheezing or shortness of breath. (Patient not taking: Reported on 03/12/2023) 1 each 12   metoprolol tartrate (LOPRESSOR) 25 MG tablet Take 0.5 tablets (12.5 mg total) by mouth 2 (two) times daily. 180 tablet 3   potassium chloride SA (KLOR-CON M)  20 MEQ tablet Take 1 tablet (20 mEq total) by mouth daily. 90 tablet 3   pravastatin (PRAVACHOL) 20 MG tablet Take 1 tablet (20 mg total) by mouth daily at 6 PM. 90 tablet 1   No current facility-administered medications for this visit.     Family History    Family History  Problem Relation Age of Onset   Lupus Mother    Lung disease Neg Hx    She indicated that her mother is deceased. She indicated that her father is deceased. She indicated that her daughter is deceased. She indicated that the status of her neg hx is unknown.  Social History    Social History   Socioeconomic History   Marital  status: Divorced    Spouse name: Not on file   Number of children: 2   Years of education: Not on file   Highest education level: Not on file  Occupational History   Not on file  Tobacco Use   Smoking status: Former    Packs/day: 1.50    Years: 54.00    Additional pack years: 0.00    Total pack years: 81.00    Types: Cigarettes    Start date: 09/16/1967    Quit date: 08/05/2022    Years since quitting: 0.6   Smokeless tobacco: Never  Substance and Sexual Activity   Alcohol use: Yes    Comment: Occasional   Drug use: Never   Sexual activity: Not on file  Other Topics Concern   Not on file  Social History Narrative   Not on file   Social Determinants of Health   Financial Resource Strain: Not on file  Food Insecurity: Not on file  Transportation Needs: Not on file  Physical Activity: Not on file  Stress: Not on file  Social Connections: Not on file  Intimate Partner Violence: Not on file     Review of Systems    General:  No chills, fever, night sweats or weight changes.  Cardiovascular:  No chest pain, dyspnea on exertion, edema, orthopnea, palpitations, paroxysmal nocturnal dyspnea. Dermatological: No rash, lesions/masses Respiratory: No cough, dyspnea Urologic: No hematuria, dysuria Abdominal:   No nausea, vomiting, diarrhea, bright red blood per rectum, melena, or hematemesis Neurologic:  No visual changes, wkns, changes in mental status. All other systems reviewed and are otherwise negative except as noted above.     Physical Exam    VS:  BP 128/82 (BP Location: Left Arm, Patient Position: Sitting, Cuff Size: Normal)   Pulse 89   Ht 5\' 6"  (1.676 m)   Wt 178 lb 12.8 oz (81.1 kg)   SpO2 95%   BMI 28.86 kg/m  , BMI Body mass index is 28.86 kg/m.     GEN: Well nourished, well developed, in no acute distress. HEENT: normal. Neck: Supple, no JVD, carotid bruits, or masses. Cardiac: IRRR, tachycardic no murmurs, rubs, or gallops. No clubbing, cyanosis, 1+  bilateral ankle edema.  Radials/DP/PT 2+ and equal bilaterally.  Respiratory:  Respirations regular and mildly labored when talking, no coughing, clear to auscultation bilaterally. GI: Soft, nontender, nondistended, BS + x 4. MS: no deformity or atrophy. Skin: warm and dry, no rash. Neuro:  Strength and sensation are intact. Psych: Normal affect.  Accessory Clinical Findings      Lab Results  Component Value Date   WBC 3.3 (L) 08/09/2021   HGB 11.3 (L) 08/09/2021   HCT 33.8 (L) 08/09/2021   MCV 105.0 (H) 08/09/2021   PLT 124 (L)  08/09/2021   Lab Results  Component Value Date   CREATININE 1.15 (H) 01/27/2023   BUN 13 01/27/2023   NA 138 01/27/2023   K 3.8 01/27/2023   CL 97 01/27/2023   CO2 24 01/27/2023   Lab Results  Component Value Date   ALT 12 08/08/2021   AST 25 08/08/2021   ALKPHOS 89 08/08/2021   BILITOT 0.7 08/08/2021   Lab Results  Component Value Date   CHOL 166 08/09/2021   HDL 68 08/09/2021   LDLCALC 86 08/09/2021   TRIG 59 08/09/2021   CHOLHDL 2.4 08/09/2021    No results found for: "HGBA1C"  Review of Prior Studies   Carotid Artery Ultrasound 03/04/2023 Right Carotid: Velocities in the right ICA are consistent with a 60-79%                 stenosis.   Left Carotid: Velocities in the left ICA are consistent with a 40-59%  stenosis.               The ECA appears >50% stenosed.   Vertebrals:  Bilateral vertebral arteries demonstrate antegrade flow.  Subclavians: Normal flow hemodynamics were seen in bilateral subclavian               arteries.   Echocardiogram 03/04/2023  1. Left ventricular ejection fraction, by estimation, is 40 to 45%. The  left ventricle has mildly decreased function. The left ventricle  demonstrates global hypokinesis. Left ventricular diastolic parameters are  indeterminate.   2. Right ventricular systolic function is mildly reduced. The right  ventricular size is mildly enlarged. There is severely elevated pulmonary   artery systolic pressure. The estimated right ventricular systolic  pressure is 76.6 mmHg.   3. Left atrial size was mildly dilated.   4. Right atrial size was moderately dilated.   5. The mitral valve is grossly normal. Moderate mitral valve  regurgitation.   6. Tricuspid valve regurgitation is moderate.   7. The aortic valve is tricuspid. There is mild calcification of the  aortic valve. There is mild thickening of the aortic valve. Aortic valve  regurgitation is not visualized. Aortic valve sclerosis/calcification is  present, without any evidence of  aortic stenosis.   8. Aortic dilatation noted. There is borderline dilatation of the  ascending aorta, measuring 37 mm.   9. The inferior vena cava is dilated in size with >50% respiratory  variability, suggesting right atrial pressure of 8 mmHg.   Assessment & Plan   1.  Persistent atrial fibrillation: CHADS VASC  3. Heart rate is elevated today between 90 and 100 bpm at rest.  I reviewed her medications with her and she should be on metoprolol tartrate 12.5 mg twice a day.  She has not taken it today and states that she only takes it daily instead of twice a day.  I have shown her the prescription and advised her to take metoprolol 12.5 twice daily as directed.  She is also on the Multaq 400 mg twice a day.  She has been taking that regularly.  She also continues on apixaban 5 mg twice daily and she has not missed any doses of that.  Her son will prepare a medication dispensing tray to help her to remember which medications to take both in the morning and in the evening.  2.  Hypertension: Well-controlled on current medication regimen.  Would not add any additional antihypertensives at this time.  3.  Hypercholesterolemia: Remains on pravastatin 20 mg daily.  Labs are completed by PCP.  4.  COPD: Is requiring home oxygen and nebulizer treatments, but has not received any of the machinery to allow this to occur.  Her son who is with her  will follow-up with pulmonology to find out best ways to approach receiving the equipment.         Signed, Bettey Mare. Liborio Nixon, ANP, AACC   03/12/2023 4:40 PM      Office [phone] Fax [phone]  Notice: This dictation was prepared with Dragon dictation along with smaller phrase technology. Any transcriptional errors that result from this process are unintentional and may not be corrected upon review.

## 2023-03-12 ENCOUNTER — Encounter: Payer: Self-pay | Admitting: Adult Health

## 2023-03-12 ENCOUNTER — Ambulatory Visit: Payer: Medicare HMO | Attending: Adult Health | Admitting: Adult Health

## 2023-03-12 VITALS — BP 128/82 | HR 89 | Ht 66.0 in | Wt 178.8 lb

## 2023-03-12 DIAGNOSIS — E7800 Pure hypercholesterolemia, unspecified: Secondary | ICD-10-CM | POA: Diagnosis not present

## 2023-03-12 DIAGNOSIS — J441 Chronic obstructive pulmonary disease with (acute) exacerbation: Secondary | ICD-10-CM | POA: Diagnosis not present

## 2023-03-12 DIAGNOSIS — I10 Essential (primary) hypertension: Secondary | ICD-10-CM | POA: Diagnosis not present

## 2023-03-12 DIAGNOSIS — I4819 Other persistent atrial fibrillation: Secondary | ICD-10-CM | POA: Diagnosis not present

## 2023-03-12 MED ORDER — FUROSEMIDE 20 MG PO TABS: 60.0000 mg | ORAL_TABLET | Freq: Every day | ORAL | 3 refills | Status: DC

## 2023-03-12 MED ORDER — POTASSIUM CHLORIDE CRYS ER 20 MEQ PO TBCR: 20.0000 meq | EXTENDED_RELEASE_TABLET | Freq: Every day | ORAL | 3 refills | Status: DC

## 2023-03-12 MED ORDER — METOPROLOL TARTRATE 25 MG PO TABS: 12.5000 mg | ORAL_TABLET | Freq: Two times a day (BID) | ORAL | 3 refills | Status: DC

## 2023-03-12 MED ORDER — APIXABAN 5 MG PO TABS: 5.0000 mg | ORAL_TABLET | Freq: Two times a day (BID) | ORAL | 3 refills | Status: DC

## 2023-03-12 MED ORDER — PRAVASTATIN SODIUM 20 MG PO TABS: 20.0000 mg | ORAL_TABLET | Freq: Every day | ORAL | 1 refills | Status: DC

## 2023-03-12 NOTE — Patient Instructions (Signed)
Medication Instructions:  No Changes *If you need a refill on your cardiac medications before your next appointment, please call your pharmacy*   Lab Work: No labs If you have labs (blood work) drawn today and your tests are completely normal, you will receive your results only by: MyChart Message (if you have MyChart) OR A paper copy in the mail If you have any lab test that is abnormal or we need to change your treatment, we will call you to review the results.   Testing/Procedures: No Testing   Follow-Up: At [facility], you and your health needs are our priority.  As part of our continuing mission to provide you with exceptional heart care, we have created designated Provider Care Teams.  These Care Teams include your primary Cardiologist (physician) and Advanced Practice Providers (APPs -  Physician Assistants and Nurse Practitioners) who all work together to provide you with the care you need, when you need it.  We recommend signing up for the patient portal called "MyChart".  Sign up information is provided on this After Visit Summary.  MyChart is used to connect with patients for Virtual Visits (Telemedicine).  Patients are able to view lab/test results, encounter notes, upcoming appointments, etc.  Non-urgent messages can be sent to your provider as well.   To learn more about what you can do with MyChart, go to [email or web address].    Your next appointment:   1 month(s)  Provider:   Kathryn Lawrence, DNP, ANP        

## 2023-03-13 ENCOUNTER — Emergency Department (HOSPITAL_COMMUNITY): Payer: Medicare HMO

## 2023-03-13 ENCOUNTER — Inpatient Hospital Stay (HOSPITAL_COMMUNITY)
Admission: EM | Admit: 2023-03-13 | Discharge: 2023-03-25 | DRG: 286 | Disposition: A | Discharge status: Home / Self Care | Payer: Medicare HMO | Attending: Internal Medicine | Admitting: Internal Medicine

## 2023-03-13 ENCOUNTER — Encounter (HOSPITAL_COMMUNITY): Payer: Self-pay

## 2023-03-13 ENCOUNTER — Other Ambulatory Visit: Payer: Self-pay

## 2023-03-13 ENCOUNTER — Telehealth: Payer: Self-pay | Admitting: Internal Medicine

## 2023-03-13 ENCOUNTER — Other Ambulatory Visit (HOSPITAL_COMMUNITY): Payer: Self-pay

## 2023-03-13 DIAGNOSIS — Z9049 Acquired absence of other specified parts of digestive tract: Secondary | ICD-10-CM

## 2023-03-13 DIAGNOSIS — Z7982 Long term (current) use of aspirin: Secondary | ICD-10-CM

## 2023-03-13 DIAGNOSIS — E785 Hyperlipidemia, unspecified: Secondary | ICD-10-CM | POA: Diagnosis present

## 2023-03-13 DIAGNOSIS — Z90710 Acquired absence of both cervix and uterus: Secondary | ICD-10-CM

## 2023-03-13 DIAGNOSIS — I6523 Occlusion and stenosis of bilateral carotid arteries: Secondary | ICD-10-CM | POA: Diagnosis present

## 2023-03-13 DIAGNOSIS — K551 Chronic vascular disorders of intestine: Secondary | ICD-10-CM | POA: Diagnosis present

## 2023-03-13 DIAGNOSIS — I2582 Chronic total occlusion of coronary artery: Secondary | ICD-10-CM | POA: Diagnosis present

## 2023-03-13 DIAGNOSIS — I4819 Other persistent atrial fibrillation: Secondary | ICD-10-CM | POA: Diagnosis not present

## 2023-03-13 DIAGNOSIS — Z452 Encounter for adjustment and management of vascular access device: Secondary | ICD-10-CM | POA: Diagnosis not present

## 2023-03-13 DIAGNOSIS — I70212 Atherosclerosis of native arteries of extremities with intermittent claudication, left leg: Secondary | ICD-10-CM | POA: Diagnosis not present

## 2023-03-13 DIAGNOSIS — I081 Rheumatic disorders of both mitral and tricuspid valves: Secondary | ICD-10-CM | POA: Diagnosis present

## 2023-03-13 DIAGNOSIS — I959 Hypotension, unspecified: Secondary | ICD-10-CM | POA: Diagnosis present

## 2023-03-13 DIAGNOSIS — D631 Anemia in chronic kidney disease: Secondary | ICD-10-CM | POA: Diagnosis not present

## 2023-03-13 DIAGNOSIS — E872 Acidosis, unspecified: Secondary | ICD-10-CM | POA: Diagnosis not present

## 2023-03-13 DIAGNOSIS — R109 Unspecified abdominal pain: Secondary | ICD-10-CM | POA: Diagnosis not present

## 2023-03-13 DIAGNOSIS — I2510 Atherosclerotic heart disease of native coronary artery without angina pectoris: Secondary | ICD-10-CM | POA: Diagnosis present

## 2023-03-13 DIAGNOSIS — I2721 Secondary pulmonary arterial hypertension: Secondary | ICD-10-CM | POA: Diagnosis not present

## 2023-03-13 DIAGNOSIS — R0603 Acute respiratory distress: Secondary | ICD-10-CM | POA: Diagnosis not present

## 2023-03-13 DIAGNOSIS — K7460 Unspecified cirrhosis of liver: Secondary | ICD-10-CM | POA: Diagnosis present

## 2023-03-13 DIAGNOSIS — I701 Atherosclerosis of renal artery: Secondary | ICD-10-CM | POA: Diagnosis not present

## 2023-03-13 DIAGNOSIS — J9611 Chronic respiratory failure with hypoxia: Secondary | ICD-10-CM | POA: Diagnosis not present

## 2023-03-13 DIAGNOSIS — R7989 Other specified abnormal findings of blood chemistry: Principal | ICD-10-CM

## 2023-03-13 DIAGNOSIS — I7090 Unspecified atherosclerosis: Secondary | ICD-10-CM | POA: Diagnosis not present

## 2023-03-13 DIAGNOSIS — I509 Heart failure, unspecified: Secondary | ICD-10-CM | POA: Diagnosis not present

## 2023-03-13 DIAGNOSIS — I361 Nonrheumatic tricuspid (valve) insufficiency: Secondary | ICD-10-CM | POA: Diagnosis not present

## 2023-03-13 DIAGNOSIS — R188 Other ascites: Secondary | ICD-10-CM | POA: Diagnosis not present

## 2023-03-13 DIAGNOSIS — Z7901 Long term (current) use of anticoagulants: Secondary | ICD-10-CM

## 2023-03-13 DIAGNOSIS — J449 Chronic obstructive pulmonary disease, unspecified: Secondary | ICD-10-CM | POA: Diagnosis not present

## 2023-03-13 DIAGNOSIS — N179 Acute kidney failure, unspecified: Secondary | ICD-10-CM | POA: Diagnosis not present

## 2023-03-13 DIAGNOSIS — I480 Paroxysmal atrial fibrillation: Secondary | ICD-10-CM | POA: Diagnosis not present

## 2023-03-13 DIAGNOSIS — R778 Other specified abnormalities of plasma proteins: Secondary | ICD-10-CM | POA: Diagnosis not present

## 2023-03-13 DIAGNOSIS — Z951 Presence of aortocoronary bypass graft: Secondary | ICD-10-CM | POA: Diagnosis not present

## 2023-03-13 DIAGNOSIS — Z555 Less than a high school diploma: Secondary | ICD-10-CM

## 2023-03-13 DIAGNOSIS — I5082 Biventricular heart failure: Secondary | ICD-10-CM | POA: Diagnosis present

## 2023-03-13 DIAGNOSIS — Z87891 Personal history of nicotine dependence: Secondary | ICD-10-CM | POA: Diagnosis not present

## 2023-03-13 DIAGNOSIS — F32A Depression, unspecified: Secondary | ICD-10-CM

## 2023-03-13 DIAGNOSIS — R079 Chest pain, unspecified: Secondary | ICD-10-CM | POA: Diagnosis not present

## 2023-03-13 DIAGNOSIS — I130 Hypertensive heart and chronic kidney disease with heart failure and stage 1 through stage 4 chronic kidney disease, or unspecified chronic kidney disease: Secondary | ICD-10-CM | POA: Diagnosis not present

## 2023-03-13 DIAGNOSIS — I700 Atherosclerosis of aorta: Secondary | ICD-10-CM | POA: Diagnosis not present

## 2023-03-13 DIAGNOSIS — I4821 Permanent atrial fibrillation: Secondary | ICD-10-CM | POA: Diagnosis present

## 2023-03-13 DIAGNOSIS — I708 Atherosclerosis of other arteries: Secondary | ICD-10-CM | POA: Diagnosis present

## 2023-03-13 DIAGNOSIS — I255 Ischemic cardiomyopathy: Secondary | ICD-10-CM | POA: Diagnosis present

## 2023-03-13 DIAGNOSIS — N189 Chronic kidney disease, unspecified: Secondary | ICD-10-CM | POA: Diagnosis not present

## 2023-03-13 DIAGNOSIS — I5043 Acute on chronic combined systolic (congestive) and diastolic (congestive) heart failure: Secondary | ICD-10-CM | POA: Diagnosis not present

## 2023-03-13 DIAGNOSIS — I739 Peripheral vascular disease, unspecified: Secondary | ICD-10-CM

## 2023-03-13 DIAGNOSIS — R001 Bradycardia, unspecified: Secondary | ICD-10-CM | POA: Diagnosis present

## 2023-03-13 DIAGNOSIS — N1831 Chronic kidney disease, stage 3a: Secondary | ICD-10-CM | POA: Diagnosis not present

## 2023-03-13 DIAGNOSIS — Z79899 Other long term (current) drug therapy: Secondary | ICD-10-CM

## 2023-03-13 DIAGNOSIS — J441 Chronic obstructive pulmonary disease with (acute) exacerbation: Secondary | ICD-10-CM | POA: Diagnosis not present

## 2023-03-13 DIAGNOSIS — I5023 Acute on chronic systolic (congestive) heart failure: Secondary | ICD-10-CM | POA: Diagnosis present

## 2023-03-13 DIAGNOSIS — I110 Hypertensive heart disease with heart failure: Secondary | ICD-10-CM | POA: Diagnosis not present

## 2023-03-13 DIAGNOSIS — R0789 Other chest pain: Secondary | ICD-10-CM | POA: Diagnosis not present

## 2023-03-13 DIAGNOSIS — R072 Precordial pain: Secondary | ICD-10-CM | POA: Diagnosis present

## 2023-03-13 DIAGNOSIS — F419 Anxiety disorder, unspecified: Secondary | ICD-10-CM | POA: Diagnosis present

## 2023-03-13 DIAGNOSIS — I10 Essential (primary) hypertension: Secondary | ICD-10-CM | POA: Diagnosis present

## 2023-03-13 DIAGNOSIS — I4891 Unspecified atrial fibrillation: Secondary | ICD-10-CM | POA: Diagnosis not present

## 2023-03-13 DIAGNOSIS — I517 Cardiomegaly: Secondary | ICD-10-CM | POA: Diagnosis not present

## 2023-03-13 DIAGNOSIS — R0989 Other specified symptoms and signs involving the circulatory and respiratory systems: Secondary | ICD-10-CM | POA: Diagnosis not present

## 2023-03-13 DIAGNOSIS — R262 Difficulty in walking, not elsewhere classified: Secondary | ICD-10-CM | POA: Diagnosis present

## 2023-03-13 DIAGNOSIS — I493 Ventricular premature depolarization: Secondary | ICD-10-CM | POA: Diagnosis present

## 2023-03-13 DIAGNOSIS — Z8711 Personal history of peptic ulcer disease: Secondary | ICD-10-CM

## 2023-03-13 DIAGNOSIS — J9601 Acute respiratory failure with hypoxia: Secondary | ICD-10-CM | POA: Diagnosis not present

## 2023-03-13 DIAGNOSIS — I340 Nonrheumatic mitral (valve) insufficiency: Secondary | ICD-10-CM | POA: Diagnosis not present

## 2023-03-13 HISTORY — DX: Disorder of arteries and arterioles, unspecified: I77.9

## 2023-03-13 HISTORY — DX: Unspecified systolic (congestive) heart failure: I50.20

## 2023-03-13 HISTORY — DX: Chronic systolic (congestive) heart failure: I50.22

## 2023-03-13 HISTORY — DX: Personal history of nicotine dependence: Z87.891

## 2023-03-13 LAB — COMPREHENSIVE METABOLIC PANEL
ALT: 42 U/L (ref 0–44)
AST: 121 U/L — ABNORMAL HIGH (ref 15–41)
Albumin: 3.1 g/dL — ABNORMAL LOW (ref 3.5–5.0)
Alkaline Phosphatase: 175 U/L — ABNORMAL HIGH (ref 38–126)
Anion gap: 15 (ref 5–15)
BUN: 21 mg/dL (ref 8–23)
CO2: 19 mmol/L — ABNORMAL LOW (ref 22–32)
Calcium: 8.9 mg/dL (ref 8.9–10.3)
Chloride: 106 mmol/L (ref 98–111)
Creatinine, Ser: 1.65 mg/dL — ABNORMAL HIGH (ref 0.44–1.00)
GFR, Estimated: 33 mL/min — ABNORMAL LOW (ref >60)
Glucose, Bld: 113 mg/dL — ABNORMAL HIGH (ref 70–99)
Potassium: 3.9 mmol/L (ref 3.5–5.1)
Sodium: 140 mmol/L (ref 135–145)
Total Bilirubin: 1.2 mg/dL (ref 0.3–1.2)
Total Protein: 6.3 g/dL — ABNORMAL LOW (ref 6.5–8.1)

## 2023-03-13 LAB — CBC WITH DIFFERENTIAL/PLATELET
Abs Immature Granulocytes: 0.03 10*3/uL (ref 0.00–0.07)
Basophils Absolute: 0.1 10*3/uL (ref 0.0–0.1)
Basophils Relative: 1 %
Eosinophils Absolute: 0.1 10*3/uL (ref 0.0–0.5)
Eosinophils Relative: 1 %
HCT: 37.7 % (ref 36.0–46.0)
Hemoglobin: 11.4 g/dL — ABNORMAL LOW (ref 12.0–15.0)
Immature Granulocytes: 1 %
Lymphocytes Relative: 20 %
Lymphs Abs: 1.3 10*3/uL (ref 0.7–4.0)
MCH: 33.7 pg (ref 26.0–34.0)
MCHC: 30.2 g/dL (ref 30.0–36.0)
MCV: 111.5 fL — ABNORMAL HIGH (ref 80.0–100.0)
Monocytes Absolute: 0.5 10*3/uL (ref 0.1–1.0)
Monocytes Relative: 8 %
Neutro Abs: 4.7 10*3/uL (ref 1.7–7.7)
Neutrophils Relative %: 69 %
Platelets: 255 10*3/uL (ref 150–400)
RBC: 3.38 MIL/uL — ABNORMAL LOW (ref 3.87–5.11)
RDW: 16 % — ABNORMAL HIGH (ref 11.5–15.5)
WBC: 6.6 10*3/uL (ref 4.0–10.5)
nRBC: 0 % (ref 0.0–0.2)

## 2023-03-13 LAB — TROPONIN I (HIGH SENSITIVITY)
Troponin I (High Sensitivity): 39 ng/L — ABNORMAL HIGH (ref <18)
Troponin I (High Sensitivity): 40 ng/L — ABNORMAL HIGH (ref <18)

## 2023-03-13 LAB — BRAIN NATRIURETIC PEPTIDE: B Natriuretic Peptide: 1662.5 pg/mL — ABNORMAL HIGH (ref 0.0–100.0)

## 2023-03-13 LAB — APTT: aPTT: 38 seconds — ABNORMAL HIGH (ref 24–36)

## 2023-03-13 LAB — HEPARIN LEVEL (UNFRACTIONATED): Heparin Unfractionated: >1.1 IU/mL — ABNORMAL HIGH (ref 0.30–0.70)

## 2023-03-13 MED ORDER — ASPIRIN 81 MG PO TBEC: 81.0000 mg | DELAYED_RELEASE_TABLET | Freq: Every day | ORAL | Status: DC

## 2023-03-13 MED ORDER — PROCHLORPERAZINE EDISYLATE 10 MG/2ML IJ SOLN: 5.0000 mg | Freq: Four times a day (QID) | INTRAMUSCULAR | Status: DC | PRN

## 2023-03-13 MED ORDER — POLYETHYLENE GLYCOL 3350 17 G PO PACK: 17.0000 g | PACK | Freq: Every day | ORAL | Status: DC | PRN

## 2023-03-13 MED ORDER — SERTRALINE HCL 100 MG PO TABS
100.0000 mg | ORAL_TABLET | Freq: Every day | ORAL | Status: DC
  Administered 2023-03-13 – 2023-03-25 (×13): 100 mg via ORAL
  Filled 2023-03-13 (×13): qty 1

## 2023-03-13 MED ORDER — ALPRAZOLAM 0.25 MG PO TABS
0.2500 mg | ORAL_TABLET | Freq: Every evening | ORAL | Status: DC | PRN
  Administered 2023-03-13 – 2023-03-24 (×11): 0.25 mg via ORAL
  Filled 2023-03-13 (×13): qty 1

## 2023-03-13 MED ORDER — FUROSEMIDE 10 MG/ML IJ SOLN
20.0000 mg | Freq: Two times a day (BID) | INTRAMUSCULAR | Status: DC
  Administered 2023-03-13: 20 mg via INTRAVENOUS
  Filled 2023-03-13: qty 2

## 2023-03-13 MED ORDER — ONDANSETRON HCL 4 MG/2ML IJ SOLN
4.0000 mg | Freq: Once | INTRAMUSCULAR | Status: AC
  Administered 2023-03-13: 4 mg via INTRAVENOUS
  Filled 2023-03-13: qty 2

## 2023-03-13 MED ORDER — ASPIRIN 81 MG PO TBEC
81.0000 mg | DELAYED_RELEASE_TABLET | Freq: Every day | ORAL | Status: DC
  Administered 2023-03-14 – 2023-03-21 (×6): 81 mg via ORAL
  Filled 2023-03-13 (×7): qty 1

## 2023-03-13 MED ORDER — PRAVASTATIN SODIUM 40 MG PO TABS
20.0000 mg | ORAL_TABLET | Freq: Every day | ORAL | Status: DC
  Administered 2023-03-13: 20 mg via ORAL
  Filled 2023-03-13: qty 1

## 2023-03-13 MED ORDER — SODIUM CHLORIDE 0.9 % IV BOLUS: 500.0000 mL | Freq: Once | INTRAVENOUS | Status: DC

## 2023-03-13 MED ORDER — ACETAMINOPHEN 325 MG PO TABS
650.0000 mg | ORAL_TABLET | Freq: Four times a day (QID) | ORAL | Status: DC | PRN
  Administered 2023-03-14 – 2023-03-25 (×12): 650 mg via ORAL
  Filled 2023-03-13 (×12): qty 2

## 2023-03-13 MED ORDER — HEPARIN (PORCINE) 25000 UT/250ML-% IV SOLN
1250.0000 [IU]/h | INTRAVENOUS | Status: DC
  Administered 2023-03-13: 1050 [IU]/h via INTRAVENOUS
  Administered 2023-03-16 – 2023-03-17 (×2): 1250 [IU]/h via INTRAVENOUS
  Filled 2023-03-13 (×5): qty 250

## 2023-03-13 MED ORDER — MELATONIN 5 MG PO TABS: 5.0000 mg | ORAL_TABLET | Freq: Every evening | ORAL | Status: DC | PRN

## 2023-03-13 MED ORDER — GLUCAGON HCL RDNA (DIAGNOSTIC) 1 MG IJ SOLR
2.0000 mg | Freq: Once | INTRAMUSCULAR | Status: AC
  Administered 2023-03-13: 2 mg via INTRAVENOUS
  Filled 2023-03-13: qty 2

## 2023-03-13 MED ORDER — APIXABAN 5 MG PO TABS
5.0000 mg | ORAL_TABLET | Freq: Two times a day (BID) | ORAL | Status: DC
  Administered 2023-03-13: 5 mg via ORAL
  Filled 2023-03-13: qty 1

## 2023-03-13 MED ORDER — IOHEXOL 350 MG/ML SOLN
80.0000 mL | Freq: Once | INTRAVENOUS | Status: AC | PRN
  Administered 2023-03-13: 80 mL via INTRAVENOUS

## 2023-03-13 MED ORDER — MIDODRINE HCL 5 MG PO TABS
10.0000 mg | ORAL_TABLET | ORAL | Status: AC
  Administered 2023-03-13: 10 mg via ORAL
  Filled 2023-03-13: qty 2

## 2023-03-13 NOTE — Telephone Encounter (Signed)
Pt friend called in bc Dr. Celine Mans needs to send in nebulizer & val's for levalbuterol  Pt was told she was eligible for POC and has not rcvd it as yet, provided her with the number to [facility]. Pt was taking breztri for COPD and has not been able to tolerate 1 puff a day. Says it makes her heart race. Pt is currently in the hospital, pls contact friend tammy dowling [phone]

## 2023-03-13 NOTE — Progress Notes (Signed)
Heart Failure Nurse Navigator Progress Note  PCP: Rebecka Apley, NP PCP-Cardiologist: Jens Som Admission Diagnosis: Elevated troponin, AKI Admitted from: Home via EMS  Presentation:   Monica Zamora presented with EMS for chest pain and epigastric pain, shortness of breath, reported that patient vomited at home. BP 99/71, HR 77, BNP 1,662, Troponin 40, CXR without aneurysm or dissection, no PE.   Patient was educated on the sign and symptoms of heart failure, daily weights, when to call her doctor or go to the ED. Diet/ fluid restrictions, taking all medications as prescribed and attending all medical appointments.  Patient verbalized her understanding of all education, a HF TOC appointment was scheduled for 03/30/2023 @ pm.   ECHO/ LVEF: 40-45%  Clinical Course:  Past Medical History:  Diagnosis Date   A-fib (HCC)    COPD (chronic obstructive pulmonary disease) (HCC)    Coronary artery disease    Hx of CABG    Hypertension    PUD (peptic ulcer disease)      Social History   Socioeconomic History   Marital status: Divorced    Spouse name: Not on file   Number of children: 2   Years of education: Not on file   Highest education level: Not on file  Occupational History   Not on file  Tobacco Use   Smoking status: Former    Packs/day: 1.50    Years: 54.00    Additional pack years: 0.00    Total pack years: 81.00    Types: Cigarettes    Start date: 09/16/1967    Quit date: 08/05/2022    Years since quitting: 0.6   Smokeless tobacco: Never  Substance and Sexual Activity   Alcohol use: Yes    Comment: Occasional   Drug use: Never   Sexual activity: Not on file  Other Topics Concern   Not on file  Social History Narrative   Not on file   Social Determinants of Health   Financial Resource Strain: Not on file  Food Insecurity: Not on file  Transportation Needs: Not on file  Physical Activity: Not on file  Stress: Not on file  Social Connections: Not on  file   Education Assessment and Provision:  Detailed education and instructions provided on heart failure disease management including the following:  Signs and symptoms of Heart Failure When to call the physician Importance of daily weights Low sodium diet Fluid restriction Medication management Anticipated future follow-up appointments  Patient education given on each of the above topics.  Patient acknowledges understanding via teach back method and acceptance of all instructions.  Education Materials:  "Living Better With Heart Failure" Booklet, HF zone tool, & Daily Weight Tracker Tool.  Patient has scale at home: Yes Patient has pill box at home: yes    High Risk Criteria for Readmission and/or Poor Patient Outcomes: Heart failure hospital admissions (last 6 months): 0  No Show rate: 13% Difficult social situation: No, lives with a friend  Demonstrates medication adherence: yes Primary Language: english Literacy level: reading, writing, and comprehension  Barriers of Care:   Diet/ fluid restrictions Daily weights  Considerations/Referrals:   Referral made to Heart Failure Pharmacist Stewardship: Yes Referral made to Heart Failure CSW/NCM TOC: No Referral made to Heart & Vascular TOC clinic: Yes, 03/30/2023 @ 2 pm.   Items for Follow-up on DC/TOC: Diet/ fluid restrictions Daily weights Continued HF education   Rhae Hammock, BSN, RN Heart Failure Teacher, adult education Only

## 2023-03-13 NOTE — Consult Note (Addendum)
Cardiology Consultation   Patient ID: [name] MRN: [id number]; DOB: 01/18/1951  Admit date: 03/13/2023 Date of Consult: 03/13/2023  PCP:  Rebecka Apley, NP   [facility] Providers Cardiologist:  Olga Millers, MD        Patient Profile:   Monica Zamora is a 72 y.o. 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 Dartha Lodge.  History of Present Illness:   Monica Zamora established care with Dr. Jens Zamora in 2022, had reported CABG in 2021 in [facility] in [place] as well as h/o PAF and was on apixaban + Multaq upon establishing care which were continued. Prior records have not been available. Echo in 2022 showed LVEF 50-55% with mild pHTN, mild MR. More recently she was seen 01/2023 with worsening DOE prompting echo. There had been recently been concern for CHF from PCP so Lasix was increased to 60mg  daily. Dr. Jens Zamora recommended to continue this dose. She was in NSR by EKG at that time. She reports was previously on home O2 started about a year ago but had not been using it recently. 2D echo 03/04/23 showed LVEF declined to 40-45% with global HK, mildly enlarged RV size, severely elevated PASP 76.39mmHg, moderate MR, moderate TR, aortic sclerosis without stenosis, borderline dilatation of the ascending aorta, measuring 37 mm, dilated IVC. APP f/u was recommended. She saw Monica Zamora yesterday  who reported she was also being seen by primary care with plan for home O2 and home nebulizer treatments which she had not yet received yet. Heart rate was elevated between 90-100bpm, listed as irregular in the examination. The patient was only taking metoprolol once daily so was advised to take it twice daily. 1 month followup was recommended. Monica Zamora has been intermittently lax with compliance so has not necessarily been fully adherent with Eliquis, possibly some missed doses.  She came to the ED overnight due to chest pain, epigastric pain, and episode of vomiting. The chest pain felt like a stabbing pain at the top of her stomach associated with breaking out into a cold sweat. She has also recently been feeling lightheaded and faint with worsening DOE and orthopnea - now simply turning over in bed or taking a few steps gets her short of breath. By EMS she received 324mg  ASA and 4mg  Zofran by EMS. In the ED she was hypotensive down to the 70s and reportedly bradycardic in the 30s-40s. She was given IV glucagon for beta blocker reversal with reported improvement of BP and HR. We cannot find any telemetry strips of the bradycardia. Available strips show atrial fibrillation CVR with occasional PVCs. Glucagon was given at 226. She was off telemetry from 235-251 but on telemetry before then without clear bradycardia.   Later in the morning she received 10mg  of midodrine and 20mg  IV Lasix. CXR showed low lung volumes with chronic appearing increased interstitial markings. She underwent CT C/A/P which showed no dissection or PE, + extensive MVCAD s/p  CABG, changes keeping with PAH/RHF with retrograde opacification of hepatic venous system, mosaic attenuation in pulmonary parenchyma, occlusion of the celiac origin and high-grade stenosis of the inferior mesenteric artery origin, PVD with focal hemodynamically significant stenosis of the right renal artery and hemodynamically significant stenoses of the common  iliac artery origins bilaterally and diffusely diseased lower extremity arterial inflow vasculature, as well as morphologic changes suggestive of cirrhosis with mild ascites. She denies claudication but legs have felt "weak." She reports gradual uptrend in weight over the last few years. Labs showed new AKI with Cr 1.65 (previously 1.15), alk phos 175, albumin 3.1, AST 121, ALT wnl, hsTroponin 39->40, BNP 1662, Hgb 11.4. She reports feeling significantly better now with resolution of chest pain. BP 122/82, HR 80s-90s, 96% on 2L [place].   Past Medical History:  Diagnosis Date   A-fib [facility])    Carotid artery disease (HCC)    COPD (chronic obstructive pulmonary disease) (HCC)    Coronary artery disease    Former tobacco use    Heart failure with mildly reduced ejection fraction (HFmrEF) (HCC)    Hx of CABG    Hypertension    PUD (peptic ulcer disease)     Past Surgical History:  Procedure Laterality Date   ABDOMINAL HYSTERECTOMY     CHOLECYSTECTOMY     CORONARY ARTERY BYPASS GRAFT     ruptured disc     SPINE SURGERY       Home Medications:  Prior to Admission medications   Medication Sig Start Date End Date Taking? Authorizing Provider  acetaminophen (TYLENOL) 500 MG tablet Take 1,000 mg by mouth 2 (two) times daily as needed (pain).    [provider]  albuterol (VENTOLIN HFA) 108 (90 Base) MCG/ACT inhaler Inhale 1 puff into the lungs 2 (two) times daily as needed for wheezing or shortness of breath. 07/10/21   [provider]  ALPRAZolam Prudy Feeler) 0.5 MG tablet Take 1 tablet (0.5 mg total) by mouth at bedtime as needed for anxiety. 07/29/21   Georganna Skeans, MD  apixaban (ELIQUIS) 5 MG TABS tablet Take 1 tablet (5 mg total) by mouth 2 (two) times daily. 03/12/23   Jodelle Gross, NP  aspirin 81 MG chewable tablet Chew 81 mg by mouth daily.    [provider]  Budeson-Glycopyrrol-Formoterol (BREZTRI AEROSPHERE) 160-9-4.8 MCG/ACT AERO Inhale 2 puffs into the  lungs in the morning and at bedtime. Patient not taking: Reported on 03/12/2023 02/27/23   Charlott Holler, MD  dronedarone (MULTAQ) 400 MG tablet Take 400 mg by mouth 2 (two) times daily with a meal. 05/15/22   [provider]  furosemide (LASIX) 20 MG tablet Take 3 tablets (60 mg total) by mouth daily. 03/12/23   Jodelle Gross, NP  levalbuterol Pauline Aus) 1.25 MG/0.5ML nebulizer solution Take 1.25 mg by nebulization every 4 (four) hours as needed for wheezing or shortness of breath. Patient not taking: Reported on 03/12/2023 02/27/23   Charlott Holler, MD  metoprolol tartrate (LOPRESSOR) 25 MG tablet Take 0.5 tablets (12.5 mg total) by mouth 2 (two) times daily. 03/12/23   Jodelle Gross, NP  potassium chloride SA (KLOR-CON M) 20 MEQ tablet Take 1 tablet (20 mEq total) by mouth daily. 03/12/23   Jodelle Gross, NP  pravastatin (PRAVACHOL) 20 MG tablet Take 1 tablet (20 mg total) by mouth daily at 6 PM. 03/12/23 04/11/23  Jodelle Gross, NP  sertraline (ZOLOFT) 100 MG tablet Take 100 mg by mouth. 08/28/22  [provider]    Inpatient Medications: Scheduled Meds:  aspirin EC  81 mg Oral Daily   pravastatin  20 mg Oral q1800   sertraline  100 mg Oral Daily   Continuous Infusions:  heparin     sodium chloride Stopped (03/13/23 0305)   PRN Meds: acetaminophen, ALPRAZolam, melatonin, polyethylene glycol, prochlorperazine  Allergies:   No Known Allergies  Social History:   Social History   Socioeconomic History   Marital status: Divorced    Spouse name: Not on file   Number of children: 2   Years of education: Not on file   Highest education level: 11th grade  Occupational History   Occupation: Retired  Tobacco Use   Smoking status: Former    Packs/day: 1.50    Years: 54.00    Additional pack years: 0.00    Total pack years: 81.00    Types: Cigarettes    Start date: 09/16/1967    Quit date: 08/05/2022    Years since quitting: 0.6   Smokeless  tobacco: Never  Vaping Use   Vaping Use: Never used  Substance and Sexual Activity   Alcohol use: Not Currently   Drug use: Never   Sexual activity: Not on file  Other Topics Concern   Not on file  Social History Narrative   Not on file   Social Determinants of Health   Financial Resource Strain: Low Risk  (03/13/2023)   Overall Financial Resource Strain (CARDIA)    Difficulty of Paying Living Expenses: Not very hard  Food Insecurity: No Food Insecurity (03/13/2023)   Hunger Vital Sign    Worried About Running Out of Food in the Last Year: Never true    Ran Out of Food in the Last Year: Never true  Transportation Needs: No Transportation Needs (03/13/2023)   PRAPARE - Administrator, Civil Service (Medical): No    Lack of Transportation (Non-Medical): No  Physical Activity: Not on file  Stress: Not on file  Social Connections: Not on file  Intimate Partner Violence: Not on file    Family History:    Family History  Problem Relation Age of Onset   Lupus Mother    Lung disease Neg Hx      ROS:  Please see the history of present illness.  Reports gradual weight gain since moving to [place]. All other ROS reviewed and negative.     Physical Exam/Data:   Vitals:   03/13/23 0709 03/13/23 1049 03/13/23 1217 03/13/23 1530  BP: 109/79  118/67 122/82  Pulse: 83 80 82 88  Resp: 14 19 19 18   Temp: (!) 97.5 F (36.4 C)  98.3 F (36.8 C) 97.6 F (36.4 C)  TempSrc: Oral  Oral Oral  SpO2: 95% 92% 93% 96%  Weight:      Height:        Intake/Output Summary (Last 24 hours) at 03/13/2023 1707 Last data filed at 03/13/2023 0830 Gross per 24 hour  Intake 240 ml  Output --  Net 240 ml      03/13/2023    1:07 AM 03/12/2023    3:02 PM 02/27/2023   10:13 AM  Last 3 Weights  Weight (lbs) 179 lb 178 lb 12.8 oz 179 lb 6.4 oz  Weight (kg) 81.194 kg 81.103 kg 81.375 kg     Body mass index is 28.89 kg/m.  Exam per MD.  EKG:  The EKG was personally reviewed and  demonstrates:  atrial fibrillation 96bpm, nonspecific TW changes  Telemetry:  Telemetry was personally reviewed and demonstrates:  afib occ PVCs  Relevant CV Studies: 2d echo 03/04/23   1. Left ventricular ejection fraction, by estimation, is 40 to 45%. The  left ventricle has mildly decreased function. The left ventricle  demonstrates global hypokinesis. Left ventricular diastolic parameters are  indeterminate.   2. Right ventricular systolic function is mildly reduced. The right  ventricular size is mildly enlarged. There is severely elevated pulmonary  artery systolic pressure. The estimated right ventricular systolic  pressure is 76.6 mmHg.   3. Left atrial size was mildly dilated.   4. Right atrial size was moderately dilated.   5. The mitral valve is grossly normal. Moderate mitral valve  regurgitation.   6. Tricuspid valve regurgitation is moderate.   7. The aortic valve is tricuspid. There is mild calcification of the  aortic valve. There is mild thickening of the aortic valve. Aortic valve  regurgitation is not visualized. Aortic valve sclerosis/calcification is  present, without any evidence of  aortic stenosis.   8. Aortic dilatation noted. There is borderline dilatation of the  ascending aorta, measuring 37 mm.   9. The inferior vena cava is dilated in size with >50% respiratory  variability, suggesting right atrial pressure of 8 mmHg.   Comparison(s): Compared to prior TTE in 07/2021, the EF has dropped from  50-55% to 40%. There is now severe pulmonary HTN (previously mild) and  moderate MR/TR which were previously mild.   Laboratory Data:  High Sensitivity Troponin:   Recent Labs  Lab 03/13/23 0109 03/13/23 0258  TROPONINIHS 39* 40*     Chemistry Recent Labs  Lab 03/13/23 0109  NA 140  K 3.9  CL 106  CO2 19*  GLUCOSE 113*  BUN 21  CREATININE 1.65*  CALCIUM 8.9  GFRNONAA 33*  ANIONGAP 15    Recent Labs  Lab 03/13/23 0109  PROT 6.3*  ALBUMIN  3.1*  AST 121*  ALT 42  ALKPHOS 175*  BILITOT 1.2   Lipids No results for input(s): "CHOL", "TRIG", "HDL", "LABVLDL", "LDLCALC", "CHOLHDL" in the last 168 hours.  Hematology Recent Labs  Lab 03/13/23 0109  WBC 6.6  RBC 3.38*  HGB 11.4*  HCT 37.7  MCV 111.5*  MCH 33.7  MCHC 30.2  RDW 16.0*  PLT 255   Thyroid No results for input(s): "TSH", "FREET4" in the last 168 hours.  BNP Recent Labs  Lab 03/13/23 0109  BNP 1,662.5*    DDimer No results for input(s): "DDIMER" in the last 168 hours.   Radiology/Studies:  CT Angio Chest/Abd/Pel for Dissection W and/or Wo Contrast  Result Date: 03/13/2023 CLINICAL DATA:  Acute aortic syndrome, chest pain, epigastric abdominal pain EXAM: CT ANGIOGRAPHY CHEST, ABDOMEN AND PELVIS TECHNIQUE: Non-contrast CT of the chest was initially obtained. Multidetector CT imaging through the chest, abdomen and pelvis was performed using the standard protocol during bolus administration of intravenous contrast. Multiplanar reconstructed images and MIPs were obtained and reviewed to evaluate the vascular anatomy. RADIATION DOSE REDUCTION: This exam was performed according to the departmental dose-optimization program which includes automated exposure control, adjustment of the mA and/or kV according to patient size and/or use of iterative reconstruction technique. CONTRAST:  80mL OMNIPAQUE IOHEXOL 350 MG/ML SOLN COMPARISON:  None Available. FINDINGS: CTA CHEST FINDINGS Cardiovascular: Extensive multi-vessel coronary artery calcification. Coronary artery bypass grafting has been performed. Global cardiac size within normal limits. No pericardial effusion. Central pulmonary arteries are enlarged in keeping with changes of pulmonary arterial hypertension. There is  adequate opacification of the pulmonary arterial tree and no intraluminal filling defect is identified through the segmental level to suggest acute pulmonary embolism. The thoracic aorta is normal in caliber;  no aneurysm, intramural hematoma or dissection is identified. Moderate mixed atherosclerotic plaque is seen within the thoracic aorta. Mediastinum/Nodes: No enlarged mediastinal, hilar, or axillary lymph nodes. Thyroid gland, trachea, and esophagus demonstrate no significant findings. Lungs/Pleura: There is mosaic attenuation within the pulmonary parenchyma in keeping with multifocal air trapping related to this probable small airways disease. There is bronchial wall thickening present in keeping with airway inflammation. No confluent pulmonary infiltrate. No pneumothorax or pleural effusion. No central obstructing lesion. Musculoskeletal: Remote left fifth rib fracture noted anteriorly. No acute bone abnormality. No lytic or blastic bone lesion. Review of the MIP images confirms the above findings. CTA ABDOMEN AND PELVIS FINDINGS VASCULAR Aorta: Normal caliber. No aneurysm or dissection. Extensive atherosclerotic calcification with mild mural thrombus noted within the distal infrarenal abdominal aorta. No evidence of hemodynamically significant stenosis Celiac: Occluded at the origin with reconstitution via the gastroduodenal artery. Distal branches appear diminutive in caliber. Variant anatomy with early takeoff of the right hepatic artery. SMA: Moderate arteriosclerosis involving the mid and distal segments. No evidence of hemodynamically significant stenosis. No aneurysm or dissection. Renals: Single renal arteries are seen bilaterally demonstrating extensive arteriosclerosis. The renal arteries appear diminutive in caliber. Near occlusion noted involving the proximal right renal artery secondary to atherosclerotic calcification. The left renal artery appears diffusely diminutive in caliber without focal stenosis identified. Normal vascular morphology. IMA: High-grade stenosis at the origin. Distally diminutive but patent. Inflow: Extensive atherosclerotic calcification results in hemodynamically significant  stenoses of the common iliac artery origins bilaterally. The lower extremity arterial inflow vasculature is diffusely diseased and diminutive in caliber. Internal iliac arteries are heavily diseased and appear occluded at the origin on the left with relatively poor distal reconstitution and diminutive in caliber on the right. Veins: There is retrograde opacification of the hepatic venous system with mild dilation of the hepatocaval junction in keeping with changes of right heart failure. Review of the MIP images confirms the above findings. NON-VASCULAR Hepatobiliary: Subtly nodular contour of the liver suggests changes of cirrhosis. No enhancing intrahepatic mass. Cholecystectomy has been performed. No intra or extrahepatic biliary ductal dilation. Pancreas: Unremarkable Spleen: Unremarkable Adrenals/Urinary Tract: The adrenal glands are unremarkable. The kidneys are normal in position. Bilateral renal cortical scarring noted. No hydronephrosis. No intrarenal urinary or ureteral calculi identified. The bladder is decompressed and is unremarkable. Stomach/Bowel: Stomach, small bowel, and large bowel are unremarkable. Appendix absent. No free intraperitoneal gas. Mild ascites. Lymphatic: No pathologic adenopathy within the abdomen and pelvis. Reproductive: Status post hysterectomy. No adnexal masses. Other: Tiny fat containing bilateral inguinal hernia. Musculoskeletal: Degenerative changes are seen within the lumbar spine. No acute bone abnormality. No lytic or blastic bone lesion. Review of the MIP images confirms the above findings. IMPRESSION: 1. No evidence of thoracoabdominal aortic aneurysm or dissection. 2. No pulmonary embolism identified. 3. Extensive multi-vessel coronary artery calcification, status post coronary artery bypass grafting. 4. Morphologic changes in keeping with pulmonary arterial hypertension and right heart failure with retrograde opacification of the hepatic venous system. 5. Mosaic  attenuation within the pulmonary parenchyma and bronchial wall thickening in keeping with airway inflammation and multifocal air trapping. 6. 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 Clinical correlation for signs and symptoms of chronic mesenteric ischemia may be helpful. 7. Diffusely diminutive renal  arteries bilaterally. Superimposed focal hemodynamically significant stenosis of the right renal artery origin. 8. Peripheral vascular disease with hemodynamically significant stenoses of the common iliac artery origins bilaterally and diffusely diseased lower extremity arterial inflow vasculature. 9. Morphologic changes suggesting cirrhosis. This could be confirmed with hepatic elastography or tissue sampling. Mild ascites. Aortic Atherosclerosis (ICD10-I70.0). Electronically Signed   By: Helyn Numbers M.D.   On: 03/13/2023 03:37   DG Chest Portable 1 View  Result Date: 03/13/2023 CLINICAL DATA:  Chest pain. EXAM: PORTABLE CHEST 1 VIEW COMPARISON:  July 19, 2021 FINDINGS: Multiple sternal wires and vascular clips are noted. The cardiac silhouette is mildly enlarged. There is moderate severity calcification of the thoracic aorta. Low lung volumes are seen. Mild, diffuse, chronic appearing increased interstitial lung markings are seen. There is no evidence of acute infiltrate, pleural effusion or pneumothorax. The visualized skeletal structures are unremarkable. IMPRESSION: 1. Evidence of prior median sternotomy/CABG. 2. Low lung volumes without acute or active cardiopulmonary disease. Electronically Signed   By: Aram Candela M.D.   On: 03/13/2023 01:37     Assessment and Plan:   1. Chest pain with minimal troponin elevation, known CAD s/p CABG in setting of hypotension, cannot r/o unstable angina - troponin is low and flat less suggestive of ACS, suspect acute symptoms related to hypotension but cannot exclude progressive underlying CAD given  recent decline in LVEF by echocardiogram - per d/w Dr. Excell Seltzer, will add back 81mg  ASA, hold Eliquis and start heparin per pharmacy, and anticipate possible [facility] on Monday (have scheduled with Dr. Okey Dupre tentatively but will need orders from weekend rounder contingent on progress and renal function) - hold off on BB given reported bradycardia in ED, follow HR trends - see below re: statin  2. Acute on chronic HFmrEF, severe pulmonary HTN - recent decline in LVEF to 40-45% with severe pulm HTN, etiology of decline unclear - cannot exclude progression in CAD, see above - unclear if severe pHTN is related to underlying severe lung disease vs heart failure, anticipate R/LHC as above - hold off additional diuresis and GDMT given presenting AKI and hypotension  3. AKI on suspected CKD stage 3a - prior Cr ran 1.15, admitted at 1.64 in setting of hypotension - follow  4. Persistent atrial fibrillation with reported bradycardia in ED - was in NSR in 01/2023 so cannot explain dyspnea entirely by recurrence of AFib - suspect back in AF at OV yesterday, remains in AF with CVR at this time - would stop Multaq given contraindication in diagnosis of heart failure  - for now we anticipate rate control strategy given suspected severe underlying lung disease - hold Eliquis in prep for cath as above, switching to IV heparin  5. Multivessel PAD in celiac, IMA, right renal artery, bilateral CIA and diffusely diseased LE arterial flow, also known carotid disease by recent duplex - no claudication  - cannot r/o mesenteric ischemia with epigastric pain in setting of hypotension, will need to follow for symptoms - f/u carotid duplex recommended 02/2024  6. Hyperlipidemia - check lipid profile in AM, would not advance pravastatin dose acutely given mild uptrend in LFTs  7. Moderate MR/TR by echo - follow clinically in context above  Remainder of issues per IM: - abnormal LFTs, question of cirrhosis on imaging -  mild anemia - COPD   Risk Assessment/Risk Scores:     TIMI Risk Score for Unstable Angina or Non-ST Elevation MI:   The patient's TIMI risk score is 5, which  indicates a 26% risk of all cause mortality, new or recurrent myocardial infarction or need for urgent revascularization in the next 14 days.  New York Heart Association (NYHA) Functional Class NYHA Class III-IV  CHA2DS2-VASc Score = 5   This indicates a 7.2% annual risk of stroke. The patient's score is based upon: CHF History: 1 HTN History: 1 Diabetes History: 0 Stroke History: 0 Vascular Disease History: 1 Age Score: 1 Gender Score: 1    For questions or updates, please contact [facility] Please consult [email or web address] for contact info under   Signed, Laurann Montana, PA-C  03/13/2023 5:07 PM  Patient seen, examined. Available data reviewed. Agree with findings, assessment, and plan as outlined by Ronie Spies, PA-C.  The patient is independently interviewed and examined.  She is an elderly appearing woman in no distress, on oxygen per nasal cannula.  HEENT is normal.  JVP is moderately elevated.  Lungs have very poor air movement bilaterally with diminished breath sounds left greater than right in the lower lung fields.  Heart sounds are distant with no murmur or gallop and irregular.  Abdomen is soft and nontender, mildly distended, no masses, rebound, or guarding.  Lower extremities have trace pretibial edema.  There is mild bilateral ankle edema.  The patient has persistent atrial fibrillation, previously on Multaq and anticoagulated with apixaban.  It sounds like she has been inconsistent with taking her medications.  She also has findings of acute on chronic combined systolic and diastolic heart failure with severe pulmonary hypertension.  I personally reviewed her echo images which demonstrate at least moderate mitral regurgitation, moderate tricuspid regurgitation, mild segmental LV systolic dysfunction, normal RV  function, and severe pulmonary hypertension.  The patient is actually feeling better after receiving some IV fluids.  She comes in with acute kidney injury.  Her volume status is somewhat difficult in the setting of severe pulmonary hypertension, but she does have some evidence of right-sided volume overload and her IVC is dilated on echo.  She had a CT angiogram early this morning and I think she is at risk for further acute kidney injury.  I would avoid aggressive diuresis as she is oxygenating well at present.  Would not give her any more fluids.  Recommend seeing where her creatinine settles out tomorrow.  Recommend treating her with IV heparin for atrial fibrillation.  Recommend consideration of right and left heart catheterization on Monday with graft angiography and hemodynamic assessment.  She could have pulmonary hypertension related to left heart disease or related to severe oxygen dependent COPD.  Will need to trend her creatinine over the weekend to see if she will be a candidate for Monday heart catheterization with coronary and bypass graft angiography.  Regarding her atrial fibrillation, we will discontinue Multaq.  She is not a very good candidate for amiodarone because of her lung disease.  Right now her rate is controlled and would continue with that at present.  I think it will probably be hard to keep her in sinus rhythm with an antiarrhythmic drug strategy.  Tonny Bollman, M.D. 03/13/2023 5:48 PM

## 2023-03-13 NOTE — ED Notes (Signed)
ED TO INPATIENT HANDOFF REPORT  ED Nurse Name and Phone #:   Marcello Moores [phone]  S Name/Age/Gender Monica Zamora 72 y.o. female Room/Bed: 035C/035C  Code Status   Code Status: Prior  Home/SNF/Other Home Patient oriented to: self, place, time, and situation Is this baseline? Yes   Triage Complete: Triage complete  Chief Complaint Acute on chronic systolic (congestive) heart failure (HCC) [I50.23]  Triage Note Patient BIB EMS from home due to chest pain and epigastric pain. Patient rates pain 2/10. Patient reports pain is also in right side of neck. Patient states that she vomited and briefly took pain away but then came back. Patient has hx afib. Patient endorses SOB when ambulating. Patient given 100 mL NS, 325 aspirin, and 4mg  zofran. Patient A&Ox4.   Allergies No Known Allergies  Level of Care/Admitting Diagnosis ED Disposition     ED Disposition  Admit   Condition  --   Comment  Hospital Area: [facility] [[id number]]  Level of Care: Telemetry Cardiac [[id number]]  May admit patient to [facility] or [facility] if equivalent level of care is available:: No  Covid Evaluation: Asymptomatic - no recent exposure (last 10 days) testing not required  Diagnosis: Acute on chronic systolic (congestive) heart failure [facility]) [[id number]]  Admitting Physician: Darlin Drop [[id number]]  Attending Physician: Darlin Drop [[id number]]  Certification:: I certify this patient will need inpatient services for at least 2 midnights  Estimated Length of Stay: 2          B Medical/Surgery History Past Medical History:  Diagnosis Date   A-fib (HCC)    COPD (chronic obstructive pulmonary disease) (HCC)    Coronary artery disease    Hx of CABG    Hypertension    PUD (peptic ulcer disease)    Past Surgical History:  Procedure Laterality Date   ABDOMINAL HYSTERECTOMY     CHOLECYSTECTOMY     CORONARY ARTERY BYPASS GRAFT     ruptured disc     SPINE SURGERY       A IV  Location/Drains/Wounds Patient Lines/Drains/Airways Status     Active Line/Drains/Airways     Name Placement date Placement time Site Days   Peripheral IV 03/13/23 18 G Left Antecubital 03/13/23  --  Antecubital  less than 1            Intake/Output Last 24 hours No intake or output data in the 24 hours ending 03/13/23 0502  Labs/Imaging Results for orders placed or performed during the hospital encounter of 03/13/23 (from the past 48 hour(s))  CBC with Differential     Status: Abnormal   Collection Time: 03/13/23  1:09 AM  Result Value Ref Range   WBC 6.6 4.0 - 10.5 K/uL   RBC 3.38 (L) 3.87 - 5.11 MIL/uL   Hemoglobin 11.4 (L) 12.0 - 15.0 g/dL   HCT 86.5 78.4 - 69.6 %   MCV 111.5 (H) 80.0 - 100.0 fL   MCH 33.7 26.0 - 34.0 pg   MCHC 30.2 30.0 - 36.0 g/dL   RDW 29.5 (H) 28.4 - 13.2 %   Platelets 255 150 - 400 K/uL   nRBC 0.0 0.0 - 0.2 %   Neutrophils Relative % 69 %   Neutro Abs 4.7 1.7 - 7.7 K/uL   Lymphocytes Relative 20 %   Lymphs Abs 1.3 0.7 - 4.0 K/uL   Monocytes Relative 8 %   Monocytes Absolute 0.5 0.1 - 1.0 K/uL   Eosinophils Relative 1 %  Eosinophils Absolute 0.1 0.0 - 0.5 K/uL   Basophils Relative 1 %   Basophils Absolute 0.1 0.0 - 0.1 K/uL   Immature Granulocytes 1 %   Abs Immature Granulocytes 0.03 0.00 - 0.07 K/uL   Polychromasia PRESENT     Comment: Performed at [facility], [address], [place], [place] [address]  Comprehensive metabolic panel     Status: Abnormal   Collection Time: 03/13/23  1:09 AM  Result Value Ref Range   Sodium 140 135 - 145 mmol/L   Potassium 3.9 3.5 - 5.1 mmol/L   Chloride 106 98 - 111 mmol/L   CO2 19 (L) 22 - 32 mmol/L   Glucose, Bld 113 (H) 70 - 99 mg/dL    Comment: Glucose reference range applies only to samples taken after fasting for at least 8 hours.   BUN 21 8 - 23 mg/dL   Creatinine, Ser 9.14 (H) 0.44 - 1.00 mg/dL   Calcium 8.9 8.9 - 78.2 mg/dL   Total Protein 6.3 (L) 6.5 - 8.1 g/dL   Albumin 3.1 (L)  3.5 - 5.0 g/dL   AST 956 (H) 15 - 41 U/L   ALT 42 0 - 44 U/L   Alkaline Phosphatase 175 (H) 38 - 126 U/L   Total Bilirubin 1.2 0.3 - 1.2 mg/dL   GFR, Estimated 33 (L) >60 mL/min    Comment: (NOTE) Calculated using the CKD-EPI Creatinine Equation (2021)    Anion gap 15 5 - 15    Comment: Performed at [facility], [address], [place], [place] [address]  Troponin I (High Sensitivity)     Status: Abnormal   Collection Time: 03/13/23  1:09 AM  Result Value Ref Range   Troponin I (High Sensitivity) 39 (H) <18 ng/L    Comment: (NOTE) Elevated high sensitivity troponin I (hsTnI) values and significant  changes across serial measurements may suggest ACS but many other  chronic and acute conditions are known to elevate hsTnI results.  Refer to the "Links" section for chest pain algorithms and additional  guidance. Performed at [facility], [address] [place], [place] [address]   Brain natriuretic peptide     Status: Abnormal   Collection Time: 03/13/23  1:09 AM  Result Value Ref Range   B Natriuretic Peptide 1,662.5 (H) 0.0 - 100.0 pg/mL    Comment: Performed at [facility], [address], [place], [place] [address]  Troponin I (High Sensitivity)     Status: Abnormal   Collection Time: 03/13/23  2:58 AM  Result Value Ref Range   Troponin I (High Sensitivity) 40 (H) <18 ng/L    Comment: (NOTE) Elevated high sensitivity troponin I (hsTnI) values and significant  changes across serial measurements may suggest ACS but many other  chronic and acute conditions are known to elevate hsTnI results.  Refer to the "Links" section for chest pain algorithms and additional  guidance. Performed at [facility], [address], [place], [place] [address]    CT Angio Chest/Abd/Pel for Dissection W and/or Wo Contrast  Result Date: 03/13/2023 CLINICAL DATA:  Acute aortic syndrome, chest pain, epigastric abdominal pain EXAM: CT ANGIOGRAPHY CHEST, ABDOMEN AND PELVIS  TECHNIQUE: Non-contrast CT of the chest was initially obtained. Multidetector CT imaging through the chest, abdomen and pelvis was performed using the standard protocol during bolus administration of intravenous contrast. Multiplanar reconstructed images and MIPs were obtained and reviewed to evaluate the vascular anatomy. RADIATION DOSE  REDUCTION: This exam was performed according to the departmental dose-optimization program which includes automated exposure control, adjustment of the mA and/or kV according to patient size and/or use of iterative reconstruction technique. CONTRAST:  80mL OMNIPAQUE IOHEXOL 350 MG/ML SOLN COMPARISON:  None Available. FINDINGS: CTA CHEST FINDINGS Cardiovascular: Extensive multi-vessel coronary artery calcification. Coronary artery bypass grafting has been performed. Global cardiac size within normal limits. No pericardial effusion. Central pulmonary arteries are enlarged in keeping with changes of pulmonary arterial hypertension. There is adequate opacification of the pulmonary arterial tree and no intraluminal filling defect is identified through the segmental level to suggest acute pulmonary embolism. The thoracic aorta is normal in caliber; no aneurysm, intramural hematoma or dissection is identified. Moderate mixed atherosclerotic plaque is seen within the thoracic aorta. Mediastinum/Nodes: No enlarged mediastinal, hilar, or axillary lymph nodes. Thyroid gland, trachea, and esophagus demonstrate no significant findings. Lungs/Pleura: There is mosaic attenuation within the pulmonary parenchyma in keeping with multifocal air trapping related to this probable small airways disease. There is bronchial wall thickening present in keeping with airway inflammation. No confluent pulmonary infiltrate. No pneumothorax or pleural effusion. No central obstructing lesion. Musculoskeletal: Remote left fifth rib fracture noted anteriorly. No acute bone abnormality. No lytic or blastic bone  lesion. Review of the MIP images confirms the above findings. CTA ABDOMEN AND PELVIS FINDINGS VASCULAR Aorta: Normal caliber. No aneurysm or dissection. Extensive atherosclerotic calcification with mild mural thrombus noted within the distal infrarenal abdominal aorta. No evidence of hemodynamically significant stenosis Celiac: Occluded at the origin with reconstitution via the gastroduodenal artery. Distal branches appear diminutive in caliber. Variant anatomy with early takeoff of the right hepatic artery. SMA: Moderate arteriosclerosis involving the mid and distal segments. No evidence of hemodynamically significant stenosis. No aneurysm or dissection. Renals: Single renal arteries are seen bilaterally demonstrating extensive arteriosclerosis. The renal arteries appear diminutive in caliber. Near occlusion noted involving the proximal right renal artery secondary to atherosclerotic calcification. The left renal artery appears diffusely diminutive in caliber without focal stenosis identified. Normal vascular morphology. IMA: High-grade stenosis at the origin. Distally diminutive but patent. Inflow: Extensive atherosclerotic calcification results in hemodynamically significant stenoses of the common iliac artery origins bilaterally. The lower extremity arterial inflow vasculature is diffusely diseased and diminutive in caliber. Internal iliac arteries are heavily diseased and appear occluded at the origin on the left with relatively poor distal reconstitution and diminutive in caliber on the right. Veins: There is retrograde opacification of the hepatic venous system with mild dilation of the hepatocaval junction in keeping with changes of right heart failure. Review of the MIP images confirms the above findings. NON-VASCULAR Hepatobiliary: Subtly nodular contour of the liver suggests changes of cirrhosis. No enhancing intrahepatic mass. Cholecystectomy has been performed. No intra or extrahepatic biliary ductal  dilation. Pancreas: Unremarkable Spleen: Unremarkable Adrenals/Urinary Tract: The adrenal glands are unremarkable. The kidneys are normal in position. Bilateral renal cortical scarring noted. No hydronephrosis. No intrarenal urinary or ureteral calculi identified. The bladder is decompressed and is unremarkable. Stomach/Bowel: Stomach, small bowel, and large bowel are unremarkable. Appendix absent. No free intraperitoneal gas. Mild ascites. Lymphatic: No pathologic adenopathy within the abdomen and pelvis. Reproductive: Status post hysterectomy. No adnexal masses. Other: Tiny fat containing bilateral inguinal hernia. Musculoskeletal: Degenerative changes are seen within the lumbar spine. No acute bone abnormality. No lytic or blastic bone lesion. Review of the MIP images confirms the above findings. IMPRESSION: 1. No evidence of thoracoabdominal aortic aneurysm or dissection. 2. No pulmonary embolism identified. 3.  Extensive multi-vessel coronary artery calcification, status post coronary artery bypass grafting. 4. Morphologic changes in keeping with pulmonary arterial hypertension and right heart failure with retrograde opacification of the hepatic venous system. 5. Mosaic attenuation within the pulmonary parenchyma and bronchial wall thickening in keeping with airway inflammation and multifocal air trapping. 6. 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 Clinical correlation for signs and symptoms of chronic mesenteric ischemia may be helpful. 7. Diffusely diminutive renal arteries bilaterally. Superimposed focal hemodynamically significant stenosis of the right renal artery origin. 8. Peripheral vascular disease with hemodynamically significant stenoses of the common iliac artery origins bilaterally and diffusely diseased lower extremity arterial inflow vasculature. 9. Morphologic changes suggesting cirrhosis. This could be confirmed with hepatic elastography  or tissue sampling. Mild ascites. Aortic Atherosclerosis (ICD10-I70.0). Electronically Signed   By: Helyn Numbers M.D.   On: 03/13/2023 03:37   DG Chest Portable 1 View  Result Date: 03/13/2023 CLINICAL DATA:  Chest pain. EXAM: PORTABLE CHEST 1 VIEW COMPARISON:  July 19, 2021 FINDINGS: Multiple sternal wires and vascular clips are noted. The cardiac silhouette is mildly enlarged. There is moderate severity calcification of the thoracic aorta. Low lung volumes are seen. Mild, diffuse, chronic appearing increased interstitial lung markings are seen. There is no evidence of acute infiltrate, pleural effusion or pneumothorax. The visualized skeletal structures are unremarkable. IMPRESSION: 1. Evidence of prior median sternotomy/CABG. 2. Low lung volumes without acute or active cardiopulmonary disease. Electronically Signed   By: Aram Candela M.D.   On: 03/13/2023 01:37    Pending Labs Unresulted Labs (From admission, onward)    None       Vitals/Pain Today's Vitals   03/13/23 0315 03/13/23 0330 03/13/23 0400 03/13/23 0451  BP: 101/66 99/71 104/81   Pulse: 74 77 75   Resp: (!) 22 20 19    Temp:    97.9 F (36.6 C)  TempSrc:    Oral  SpO2: 92% 92% 93%   Weight:      Height:      PainSc:        Isolation Precautions No active isolations  Medications Medications  sodium chloride 0.9 % bolus 500 mL (0 mLs Intravenous Hold 03/13/23 0305)  ondansetron (ZOFRAN) injection 4 mg (4 mg Intravenous Given 03/13/23 0128)  glucagon (human recombinant) (GLUCAGEN) injection 2 mg (2 mg Intravenous Given 03/13/23 0226)  iohexol (OMNIPAQUE) 350 MG/ML injection 80 mL (80 mLs Intravenous Contrast Given 03/13/23 0255)    Mobility walks with device (rollator)     Focused Assessments Cardiac Assessment Handoff:  Cardiac Rhythm: Atrial fibrillation No results found for: "CKTOTAL", "CKMB", "CKMBINDEX", "TROPONINI" No results found for: "DDIMER" Does the Patient currently have chest pain? No     R Recommendations: See Admitting Provider Note  Report given to:   Additional Notes:

## 2023-03-13 NOTE — ED Provider Notes (Signed)
[place] EMERGENCY DEPARTMENT AT [facility] Provider Note   CSN: [id number] Arrival date & time: 03/13/23  0100     History  Chief Complaint  Patient presents with   Chest Pain    Monica Zamora is a 72 y.o. female.  The history is provided by the patient.  Chest Pain Pain location:  Substernal area and epigastric Pain radiates to:  Neck and R shoulder Pain severity:  Moderate Onset quality:  Gradual Timing:  Constant Chronicity:  New Context: at rest   Relieved by:  Nothing Worsened by:  Nothing Ineffective treatments:  None tried Associated symptoms: no abdominal pain, no diaphoresis and no fever   Risk factors: coronary artery disease   Patient with CAD and AFIB presents with epigastric SSCP that radiates to the right neck and shoulder.      Past Medical History:  Diagnosis Date   A-fib [facility])    COPD (chronic obstructive pulmonary disease) (HCC)    Coronary artery disease    Hx of CABG    Hypertension    PUD (peptic ulcer disease)      Home Medications Prior to Admission medications   Medication Sig Start Date End Date Taking? Authorizing Provider  acetaminophen (TYLENOL) 500 MG tablet Take 1,000 mg by mouth 2 (two) times daily as needed (pain).    [provider]  albuterol (VENTOLIN HFA) 108 (90 Base) MCG/ACT inhaler Inhale 1 puff into the lungs 2 (two) times daily as needed for wheezing or shortness of breath. 07/10/21   [provider]  ALPRAZolam Prudy Feeler) 0.5 MG tablet Take 1 tablet (0.5 mg total) by mouth at bedtime as needed for anxiety. 07/29/21   Georganna Skeans, MD  apixaban (ELIQUIS) 5 MG TABS tablet Take 1 tablet (5 mg total) by mouth 2 (two) times daily. 03/12/23   Jodelle Gross, NP  aspirin 81 MG chewable tablet Chew 81 mg by mouth daily.    [provider]  Budeson-Glycopyrrol-Formoterol (BREZTRI AEROSPHERE) 160-9-4.8 MCG/ACT AERO Inhale 2 puffs into the lungs in the morning and at bedtime. Patient  not taking: Reported on 03/12/2023 02/27/23   Charlott Holler, MD  dronedarone (MULTAQ) 400 MG tablet Take 400 mg by mouth 2 (two) times daily with a meal. 05/15/22   [provider]  furosemide (LASIX) 20 MG tablet Take 3 tablets (60 mg total) by mouth daily. 03/12/23   Jodelle Gross, NP  levalbuterol Pauline Aus) 1.25 MG/0.5ML nebulizer solution Take 1.25 mg by nebulization every 4 (four) hours as needed for wheezing or shortness of breath. Patient not taking: Reported on 03/12/2023 02/27/23   Charlott Holler, MD  metoprolol tartrate (LOPRESSOR) 25 MG tablet Take 0.5 tablets (12.5 mg total) by mouth 2 (two) times daily. 03/12/23   Jodelle Gross, NP  potassium chloride SA (KLOR-CON M) 20 MEQ tablet Take 1 tablet (20 mEq total) by mouth daily. 03/12/23   Jodelle Gross, NP  pravastatin (PRAVACHOL) 20 MG tablet Take 1 tablet (20 mg total) by mouth daily at 6 PM. 03/12/23 04/11/23  Jodelle Gross, NP  sertraline (ZOLOFT) 100 MG tablet Take 100 mg by mouth. 08/28/22   [provider]      Allergies    Patient has no known allergies.    Review of Systems   Review of Systems  Constitutional:  Negative for diaphoresis and fever.  HENT:  Negative for facial swelling.   Eyes:  Negative for redness.  Cardiovascular:  Positive for chest  pain.  Gastrointestinal:  Negative for abdominal pain.  All other systems reviewed and are negative.   Physical Exam Updated Vital Signs BP 99/71   Pulse 77   Temp 98.3 F (36.8 C) (Oral)   Resp 20   Ht 5\' 6"  (1.676 m)   Wt 81.2 kg   SpO2 92%   BMI 28.89 kg/m  Physical Exam Vitals and nursing note reviewed.  Constitutional:      General: She is not in acute distress.    Appearance: Normal appearance. She is well-developed.  HENT:     Head: Normocephalic and atraumatic.     Nose: Nose normal.  Eyes:     Pupils: Pupils are equal, round, and reactive to light.  Cardiovascular:     Rate and Rhythm: Normal rate and regular  rhythm.     Pulses: Normal pulses.     Heart sounds: Normal heart sounds.  Pulmonary:     Effort: Pulmonary effort is normal. No respiratory distress.     Breath sounds: Normal breath sounds.  Abdominal:     General: Bowel sounds are normal. There is no distension.     Palpations: Abdomen is soft.     Tenderness: There is no abdominal tenderness. There is no guarding or rebound.  Genitourinary:    Vagina: No vaginal discharge.  Musculoskeletal:        General: Normal range of motion.     Cervical back: Normal range of motion and neck supple.  Skin:    General: Skin is warm and dry.     Capillary Refill: Capillary refill takes less than 2 seconds.     Findings: No erythema or rash.  Neurological:     Mental Status: She is alert.     Deep Tendon Reflexes: Reflexes normal.  Psychiatric:        Mood and Affect: Mood normal.     ED Results / Procedures / Treatments   Labs (all labs ordered are listed, but only abnormal results are displayed) Results for orders placed or performed during the hospital encounter of 03/13/23  CBC with Differential  Result Value Ref Range   WBC 6.6 4.0 - 10.5 K/uL   RBC 3.38 (L) 3.87 - 5.11 MIL/uL   Hemoglobin 11.4 (L) 12.0 - 15.0 g/dL   HCT 95.6 21.3 - 08.6 %   MCV 111.5 (H) 80.0 - 100.0 fL   MCH 33.7 26.0 - 34.0 pg   MCHC 30.2 30.0 - 36.0 g/dL   RDW 57.8 (H) 46.9 - 62.9 %   Platelets 255 150 - 400 K/uL   nRBC 0.0 0.0 - 0.2 %   Neutrophils Relative % 69 %   Neutro Abs 4.7 1.7 - 7.7 K/uL   Lymphocytes Relative 20 %   Lymphs Abs 1.3 0.7 - 4.0 K/uL   Monocytes Relative 8 %   Monocytes Absolute 0.5 0.1 - 1.0 K/uL   Eosinophils Relative 1 %   Eosinophils Absolute 0.1 0.0 - 0.5 K/uL   Basophils Relative 1 %   Basophils Absolute 0.1 0.0 - 0.1 K/uL   Immature Granulocytes 1 %   Abs Immature Granulocytes 0.03 0.00 - 0.07 K/uL   Polychromasia PRESENT   Comprehensive metabolic panel  Result Value Ref Range   Sodium 140 135 - 145 mmol/L    Potassium 3.9 3.5 - 5.1 mmol/L   Chloride 106 98 - 111 mmol/L   CO2 19 (L) 22 - 32 mmol/L   Glucose, Bld 113 (H) 70 - 99 mg/dL  BUN 21 8 - 23 mg/dL   Creatinine, Ser 9.14 (H) 0.44 - 1.00 mg/dL   Calcium 8.9 8.9 - 78.2 mg/dL   Total Protein 6.3 (L) 6.5 - 8.1 g/dL   Albumin 3.1 (L) 3.5 - 5.0 g/dL   AST 956 (H) 15 - 41 U/L   ALT 42 0 - 44 U/L   Alkaline Phosphatase 175 (H) 38 - 126 U/L   Total Bilirubin 1.2 0.3 - 1.2 mg/dL   GFR, Estimated 33 (L) >60 mL/min   Anion gap 15 5 - 15  Brain natriuretic peptide  Result Value Ref Range   B Natriuretic Peptide 1,662.5 (H) 0.0 - 100.0 pg/mL  Troponin I (High Sensitivity)  Result Value Ref Range   Troponin I (High Sensitivity) 39 (H) <18 ng/L  Troponin I (High Sensitivity)  Result Value Ref Range   Troponin I (High Sensitivity) 40 (H) <18 ng/L   CT Angio Chest/Abd/Pel for Dissection W and/or Wo Contrast  Result Date: 03/13/2023 CLINICAL DATA:  Acute aortic syndrome, chest pain, epigastric abdominal pain EXAM: CT ANGIOGRAPHY CHEST, ABDOMEN AND PELVIS TECHNIQUE: Non-contrast CT of the chest was initially obtained. Multidetector CT imaging through the chest, abdomen and pelvis was performed using the standard protocol during bolus administration of intravenous contrast. Multiplanar reconstructed images and MIPs were obtained and reviewed to evaluate the vascular anatomy. RADIATION DOSE REDUCTION: This exam was performed according to the departmental dose-optimization program which includes automated exposure control, adjustment of the mA and/or kV according to patient size and/or use of iterative reconstruction technique. CONTRAST:  80mL OMNIPAQUE IOHEXOL 350 MG/ML SOLN COMPARISON:  None Available. FINDINGS: CTA CHEST FINDINGS Cardiovascular: Extensive multi-vessel coronary artery calcification. Coronary artery bypass grafting has been performed. Global cardiac size within normal limits. No pericardial effusion. Central pulmonary arteries are enlarged in  keeping with changes of pulmonary arterial hypertension. There is adequate opacification of the pulmonary arterial tree and no intraluminal filling defect is identified through the segmental level to suggest acute pulmonary embolism. The thoracic aorta is normal in caliber; no aneurysm, intramural hematoma or dissection is identified. Moderate mixed atherosclerotic plaque is seen within the thoracic aorta. Mediastinum/Nodes: No enlarged mediastinal, hilar, or axillary lymph nodes. Thyroid gland, trachea, and esophagus demonstrate no significant findings. Lungs/Pleura: There is mosaic attenuation within the pulmonary parenchyma in keeping with multifocal air trapping related to this probable small airways disease. There is bronchial wall thickening present in keeping with airway inflammation. No confluent pulmonary infiltrate. No pneumothorax or pleural effusion. No central obstructing lesion. Musculoskeletal: Remote left fifth rib fracture noted anteriorly. No acute bone abnormality. No lytic or blastic bone lesion. Review of the MIP images confirms the above findings. CTA ABDOMEN AND PELVIS FINDINGS VASCULAR Aorta: Normal caliber. No aneurysm or dissection. Extensive atherosclerotic calcification with mild mural thrombus noted within the distal infrarenal abdominal aorta. No evidence of hemodynamically significant stenosis Celiac: Occluded at the origin with reconstitution via the gastroduodenal artery. Distal branches appear diminutive in caliber. Variant anatomy with early takeoff of the right hepatic artery. SMA: Moderate arteriosclerosis involving the mid and distal segments. No evidence of hemodynamically significant stenosis. No aneurysm or dissection. Renals: Single renal arteries are seen bilaterally demonstrating extensive arteriosclerosis. The renal arteries appear diminutive in caliber. Near occlusion noted involving the proximal right renal artery secondary to atherosclerotic calcification. The left  renal artery appears diffusely diminutive in caliber without focal stenosis identified. Normal vascular morphology. IMA: High-grade stenosis at the origin. Distally diminutive but patent. Inflow: Extensive atherosclerotic calcification results  in hemodynamically significant stenoses of the common iliac artery origins bilaterally. The lower extremity arterial inflow vasculature is diffusely diseased and diminutive in caliber. Internal iliac arteries are heavily diseased and appear occluded at the origin on the left with relatively poor distal reconstitution and diminutive in caliber on the right. Veins: There is retrograde opacification of the hepatic venous system with mild dilation of the hepatocaval junction in keeping with changes of right heart failure. Review of the MIP images confirms the above findings. NON-VASCULAR Hepatobiliary: Subtly nodular contour of the liver suggests changes of cirrhosis. No enhancing intrahepatic mass. Cholecystectomy has been performed. No intra or extrahepatic biliary ductal dilation. Pancreas: Unremarkable Spleen: Unremarkable Adrenals/Urinary Tract: The adrenal glands are unremarkable. The kidneys are normal in position. Bilateral renal cortical scarring noted. No hydronephrosis. No intrarenal urinary or ureteral calculi identified. The bladder is decompressed and is unremarkable. Stomach/Bowel: Stomach, small bowel, and large bowel are unremarkable. Appendix absent. No free intraperitoneal gas. Mild ascites. Lymphatic: No pathologic adenopathy within the abdomen and pelvis. Reproductive: Status post hysterectomy. No adnexal masses. Other: Tiny fat containing bilateral inguinal hernia. Musculoskeletal: Degenerative changes are seen within the lumbar spine. No acute bone abnormality. No lytic or blastic bone lesion. Review of the MIP images confirms the above findings. IMPRESSION: 1. No evidence of thoracoabdominal aortic aneurysm or dissection. 2. No pulmonary embolism  identified. 3. Extensive multi-vessel coronary artery calcification, status post coronary artery bypass grafting. 4. Morphologic changes in keeping with pulmonary arterial hypertension and right heart failure with retrograde opacification of the hepatic venous system. 5. Mosaic attenuation within the pulmonary parenchyma and bronchial wall thickening in keeping with airway inflammation and multifocal air trapping. 6. 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 Clinical correlation for signs and symptoms of chronic mesenteric ischemia may be helpful. 7. Diffusely diminutive renal arteries bilaterally. Superimposed focal hemodynamically significant stenosis of the right renal artery origin. 8. Peripheral vascular disease with hemodynamically significant stenoses of the common iliac artery origins bilaterally and diffusely diseased lower extremity arterial inflow vasculature. 9. Morphologic changes suggesting cirrhosis. This could be confirmed with hepatic elastography or tissue sampling. Mild ascites. Aortic Atherosclerosis (ICD10-I70.0). Electronically Signed   By: Helyn Numbers M.D.   On: 03/13/2023 03:37   DG Chest Portable 1 View  Result Date: 03/13/2023 CLINICAL DATA:  Chest pain. EXAM: PORTABLE CHEST 1 VIEW COMPARISON:  July 19, 2021 FINDINGS: Multiple sternal wires and vascular clips are noted. The cardiac silhouette is mildly enlarged. There is moderate severity calcification of the thoracic aorta. Low lung volumes are seen. Mild, diffuse, chronic appearing increased interstitial lung markings are seen. There is no evidence of acute infiltrate, pleural effusion or pneumothorax. The visualized skeletal structures are unremarkable. IMPRESSION: 1. Evidence of prior median sternotomy/CABG. 2. Low lung volumes without acute or active cardiopulmonary disease. Electronically Signed   By: Aram Candela M.D.   On: 03/13/2023 01:37   VAS US  CAROTID  Result Date: 03/05/2023 Carotid Arterial Duplex Study Patient Name:  DASANY HAUER  Date of Exam:   03/04/2023 Medical Rec #: [id number]            Accession #:    [id number] Date of Birth: 02-16-1951            Patient Gender: F Patient Age:   32 years Exam Location:  [facility] Procedure:      VAS US CAROTID Referring Phys: Arlys John CRENSHAW --------------------------------------------------------------------------------  Indications:   Carotid artery disease. Other  Factors: Patients states she has a hx of carotid artery disease. No                previous studies found. Limitations    Today's exam was limited due to the body habitus of the patient                and the patient's respiratory variation. Performing Technologist: Jake Seats RDMS, RVT, RDCS  Examination Guidelines: A complete evaluation includes B-mode imaging, spectral Doppler, color Doppler, and power Doppler as needed of all accessible portions of each vessel. Bilateral testing is considered an integral part of a complete examination. Limited examinations for reoccurring indications may be performed as noted.  Right Carotid Findings: +----------+--------+--------+--------+------------------+--------------+           PSV cm/sEDV cm/sStenosisPlaque DescriptionComments       +----------+--------+--------+--------+------------------+--------------+ CCA Prox  132     21                                               +----------+--------+--------+--------+------------------+--------------+ CCA Distal90      12                                               +----------+--------+--------+--------+------------------+--------------+ ICA Prox  309     104     60-79%  heterogenous                     +----------+--------+--------+--------+------------------+--------------+ ICA Mid   73      16                                               +----------+--------+--------+--------+------------------+--------------+  ICA Distal                                          Not visualized +----------+--------+--------+--------+------------------+--------------+ ECA       81      0                                                +----------+--------+--------+--------+------------------+--------------+ +----------+--------+-------+----------------+-------------------+           PSV cm/sEDV cmsDescribe        Arm Pressure (mmHG) +----------+--------+-------+----------------+-------------------+ [id number]      0      Multiphasic, [id number]                 +----------+--------+-------+----------------+-------------------+ +---------+--------+--+--------+--+---------+ VertebralPSV cm/s49EDV cm/s11Antegrade +---------+--------+--+--------+--+---------+ Technically challenging study due to motion and visibility of vessels. Left Carotid Findings: +----------+--------+--------+--------+------------------+--------+           PSV cm/sEDV cm/sStenosisPlaque DescriptionComments +----------+--------+--------+--------+------------------+--------+ CCA Prox  101     14                                         +----------+--------+--------+--------+------------------+--------+  CCA Distal57      12                                         +----------+--------+--------+--------+------------------+--------+ ICA Prox  143     27              heterogenous               +----------+--------+--------+--------+------------------+--------+ ICA Mid   175     55      40-59%                             +----------+--------+--------+--------+------------------+--------+ ICA Distal127     28                                         +----------+--------+--------+--------+------------------+--------+ ECA       180     10      >50%                               +----------+--------+--------+--------+------------------+--------+  +----------+--------+--------+----------------+-------------------+           PSV cm/sEDV cm/sDescribe        Arm Pressure (mmHG) +----------+--------+--------+----------------+-------------------+ [id number]     0       Multiphasic, [id number]                 +----------+--------+--------+----------------+-------------------+ +---------+--------+--+--------+--+---------+ VertebralPSV cm/s65EDV cm/s14Antegrade +---------+--------+--+--------+--+---------+   Summary: Right Carotid: Velocities in the right ICA are consistent with a 60-79%                stenosis. Left Carotid: Velocities in the left ICA are consistent with a 40-59% stenosis.               The ECA appears >50% stenosed. Vertebrals:  Bilateral vertebral arteries demonstrate antegrade flow. Subclavians: Normal flow hemodynamics were seen in bilateral subclavian              arteries. *See table(s) above for measurements and observations. Suggest follow up study in 12 months. Electronically signed by Nanetta Batty MD on 03/05/2023 at 5:56:23 AM.    Final    ECHOCARDIOGRAM COMPLETE  Result Date: 03/04/2023    ECHOCARDIOGRAM REPORT   Patient Name:   YOZELIN SEVERO Date of Exam: 03/04/2023 Medical Rec #:  [id number]           Height:       66.0 in Accession #:    [id number]          Weight:       179.4 lb Date of Birth:  [birth year]           BSA:          1.910 m Patient Age:    72 years            BP:           120/80 mmHg Patient Gender: F                   HR:           78 bpm. Exam Location:  [address] Procedure: 2D Echo, 3D Echo, Cardiac Doppler and Color Doppler Indications:  R06.00 Dyspnea  History:        Patient has prior history of Echocardiogram examinations, most                 recent 08/11/2021. CHF, CAD, Prior CABG, COPD, Arrythmias:Atrial                 Fibrillation, Signs/Symptoms:Dyspnea and Edema; Risk                 Factors:Hypertension and Former Smoker.  Sonographer:    Farrel Conners RDCS Referring  Phys: [id number] BRIAN S CRENSHAW IMPRESSIONS  1. Left ventricular ejection fraction, by estimation, is 40 to 45%. The left ventricle has mildly decreased function. The left ventricle demonstrates global hypokinesis. Left ventricular diastolic parameters are indeterminate.  2. Right ventricular systolic function is mildly reduced. The right ventricular size is mildly enlarged. There is severely elevated pulmonary artery systolic pressure. The estimated right ventricular systolic pressure is 76.6 mmHg.  3. Left atrial size was mildly dilated.  4. Right atrial size was moderately dilated.  5. The mitral valve is grossly normal. Moderate mitral valve regurgitation.  6. Tricuspid valve regurgitation is moderate.  7. The aortic valve is tricuspid. There is mild calcification of the aortic valve. There is mild thickening of the aortic valve. Aortic valve regurgitation is not visualized. Aortic valve sclerosis/calcification is present, without any evidence of aortic stenosis.  8. Aortic dilatation noted. There is borderline dilatation of the ascending aorta, measuring 37 mm.  9. The inferior vena cava is dilated in size with >50% respiratory variability, suggesting right atrial pressure of 8 mmHg. Comparison(s): Compared to prior TTE in 07/2021, the EF has dropped from 50-55% to 40%. There is now severe pulmonary HTN (previously mild) and moderate MR/TR which were previously mild. FINDINGS  Left Ventricle: Left ventricular ejection fraction, by estimation, is 40 to 45%. The left ventricle has mildly decreased function. The left ventricle demonstrates global hypokinesis. 3D ejection fraction reviewed and evaluated as part of the interpretation. Alternate measurement of EF is felt to be most reflective of LV function. The left ventricular internal cavity size was normal in size. There is no left ventricular hypertrophy. Left ventricular diastolic parameters are indeterminate. Right Ventricle: The right ventricular size is mildly  enlarged. No increase in right ventricular wall thickness. Right ventricular systolic function is mildly reduced. There is severely elevated pulmonary artery systolic pressure. The tricuspid regurgitant velocity is 4.14 m/s, and with an assumed right atrial pressure of 8 mmHg, the estimated right ventricular systolic pressure is 76.6 mmHg. Left Atrium: Left atrial size was mildly dilated. Right Atrium: Right atrial size was moderately dilated. Pericardium: There is no evidence of pericardial effusion. Mitral Valve: The mitral valve is grossly normal. There is mild thickening of the mitral valve leaflet(s). Moderate mitral valve regurgitation. Tricuspid Valve: The tricuspid valve is normal in structure. Tricuspid valve regurgitation is moderate. Aortic Valve: The aortic valve is tricuspid. There is mild calcification of the aortic valve. There is mild thickening of the aortic valve. Aortic valve regurgitation is not visualized. Aortic valve sclerosis/calcification is present, without any evidence of aortic stenosis. Pulmonic Valve: The pulmonic valve was normal in structure. Pulmonic valve regurgitation is mild. Aorta: Aortic dilatation noted. There is borderline dilatation of the ascending aorta, measuring 37 mm. Venous: The inferior vena cava is dilated in size with greater than 50% respiratory variability, suggesting right atrial pressure of 8 mmHg. IAS/Shunts: The atrial septum is grossly normal.  LEFT VENTRICLE PLAX 2D LVIDd:  5.10 cm   Diastology LVIDs:         4.45 cm   LV e' medial:    9.46 cm/s LV PW:         0.90 cm   LV E/e' medial:  13.0 LV IVS:        1.00 cm   LV e' lateral:   10.40 cm/s LVOT diam:     2.30 cm   LV E/e' lateral: 11.8 LV SV:         46 LV SV Index:   24 LVOT Area:     4.15 cm                           3D Volume EF:                          3D EF:        41 %                          LV EDV:       168 ml                          LV ESV:       99 ml                          LV SV:         69 ml RIGHT VENTRICLE RV Basal diam:  5.00 cm RV Mid diam:    3.40 cm RV S prime:     10.80 cm/s TAPSE (M-mode): 0.8 cm RVSP:           71.6 mmHg LEFT ATRIUM             Index        RIGHT ATRIUM           Index LA diam:        4.60 cm 2.41 cm/m   RA Pressure: 3.00 mmHg LA Vol (A2C):   61.5 ml 32.21 ml/m  RA Area:     20.00 cm LA Vol (A4C):   61.7 ml 32.31 ml/m  RA Volume:   62.20 ml  32.57 ml/m LA Biplane Vol: 63.5 ml 33.25 ml/m  AORTIC VALVE LVOT Vmax:   67.60 cm/s LVOT Vmean:  44.350 cm/s LVOT VTI:    0.110 m  AORTA Ao Root diam: 3.00 cm Ao Asc diam:  3.70 cm MITRAL VALVE                TRICUSPID VALVE MV Area (PHT): cm          TR Peak grad:   68.6 mmHg MV Decel Time: 129 msec     TR Vmax:        414.00 cm/s MR Peak grad: 119.7 mmHg    Estimated RAP:  3.00 mmHg MR Mean grad: 80.0 mmHg     RVSP:           71.6 mmHg MR Vmax:      547.00 cm/s MR Vmean:     418.0 cm/s    SHUNTS MV E velocity: 123.00 cm/s  Systemic VTI:  0.11 m MV A velocity: 38.90 cm/s   Systemic Diam: 2.30 cm MV E/A ratio:  3.16 Laurance Flatten MD Electronically signed by Laurance Flatten  MD Signature Date/Time: 03/04/2023/12:23:30 PM    Final     EKG ED ECG REPORT   Date: 03/13/2023  Rate: 96  Rhythm: atrial fibrillation  QRS Axis: normal  Intervals: normal  ST/T Wave abnormalities: normal  Conduction Disutrbances:none  Narrative Interpretation:   Old EKG Reviewed: none available  I have personally reviewed the EKG tracing and agree with the computerized printout as noted.     Radiology CT Angio Chest/Abd/Pel for Dissection W and/or Wo Contrast  Result Date: 03/13/2023 CLINICAL DATA:  Acute aortic syndrome, chest pain, epigastric abdominal pain EXAM: CT ANGIOGRAPHY CHEST, ABDOMEN AND PELVIS TECHNIQUE: Non-contrast CT of the chest was initially obtained. Multidetector CT imaging through the chest, abdomen and pelvis was performed using the standard protocol during bolus administration of intravenous contrast.  Multiplanar reconstructed images and MIPs were obtained and reviewed to evaluate the vascular anatomy. RADIATION DOSE REDUCTION: This exam was performed according to the departmental dose-optimization program which includes automated exposure control, adjustment of the mA and/or kV according to patient size and/or use of iterative reconstruction technique. CONTRAST:  80mL OMNIPAQUE IOHEXOL 350 MG/ML SOLN COMPARISON:  None Available. FINDINGS: CTA CHEST FINDINGS Cardiovascular: Extensive multi-vessel coronary artery calcification. Coronary artery bypass grafting has been performed. Global cardiac size within normal limits. No pericardial effusion. Central pulmonary arteries are enlarged in keeping with changes of pulmonary arterial hypertension. There is adequate opacification of the pulmonary arterial tree and no intraluminal filling defect is identified through the segmental level to suggest acute pulmonary embolism. The thoracic aorta is normal in caliber; no aneurysm, intramural hematoma or dissection is identified. Moderate mixed atherosclerotic plaque is seen within the thoracic aorta. Mediastinum/Nodes: No enlarged mediastinal, hilar, or axillary lymph nodes. Thyroid gland, trachea, and esophagus demonstrate no significant findings. Lungs/Pleura: There is mosaic attenuation within the pulmonary parenchyma in keeping with multifocal air trapping related to this probable small airways disease. There is bronchial wall thickening present in keeping with airway inflammation. No confluent pulmonary infiltrate. No pneumothorax or pleural effusion. No central obstructing lesion. Musculoskeletal: Remote left fifth rib fracture noted anteriorly. No acute bone abnormality. No lytic or blastic bone lesion. Review of the MIP images confirms the above findings. CTA ABDOMEN AND PELVIS FINDINGS VASCULAR Aorta: Normal caliber. No aneurysm or dissection. Extensive atherosclerotic calcification with mild mural thrombus noted  within the distal infrarenal abdominal aorta. No evidence of hemodynamically significant stenosis Celiac: Occluded at the origin with reconstitution via the gastroduodenal artery. Distal branches appear diminutive in caliber. Variant anatomy with early takeoff of the right hepatic artery. SMA: Moderate arteriosclerosis involving the mid and distal segments. No evidence of hemodynamically significant stenosis. No aneurysm or dissection. Renals: Single renal arteries are seen bilaterally demonstrating extensive arteriosclerosis. The renal arteries appear diminutive in caliber. Near occlusion noted involving the proximal right renal artery secondary to atherosclerotic calcification. The left renal artery appears diffusely diminutive in caliber without focal stenosis identified. Normal vascular morphology. IMA: High-grade stenosis at the origin. Distally diminutive but patent. Inflow: Extensive atherosclerotic calcification results in hemodynamically significant stenoses of the common iliac artery origins bilaterally. The lower extremity arterial inflow vasculature is diffusely diseased and diminutive in caliber. Internal iliac arteries are heavily diseased and appear occluded at the origin on the left with relatively poor distal reconstitution and diminutive in caliber on the right. Veins: There is retrograde opacification of the hepatic venous system with mild dilation of the hepatocaval junction in keeping with changes of right heart failure. Review of the MIP images confirms the  above findings. NON-VASCULAR Hepatobiliary: Subtly nodular contour of the liver suggests changes of cirrhosis. No enhancing intrahepatic mass. Cholecystectomy has been performed. No intra or extrahepatic biliary ductal dilation. Pancreas: Unremarkable Spleen: Unremarkable Adrenals/Urinary Tract: The adrenal glands are unremarkable. The kidneys are normal in position. Bilateral renal cortical scarring noted. No hydronephrosis. No intrarenal  urinary or ureteral calculi identified. The bladder is decompressed and is unremarkable. Stomach/Bowel: Stomach, small bowel, and large bowel are unremarkable. Appendix absent. No free intraperitoneal gas. Mild ascites. Lymphatic: No pathologic adenopathy within the abdomen and pelvis. Reproductive: Status post hysterectomy. No adnexal masses. Other: Tiny fat containing bilateral inguinal hernia. Musculoskeletal: Degenerative changes are seen within the lumbar spine. No acute bone abnormality. No lytic or blastic bone lesion. Review of the MIP images confirms the above findings. IMPRESSION: 1. No evidence of thoracoabdominal aortic aneurysm or dissection. 2. No pulmonary embolism identified. 3. Extensive multi-vessel coronary artery calcification, status post coronary artery bypass grafting. 4. Morphologic changes in keeping with pulmonary arterial hypertension and right heart failure with retrograde opacification of the hepatic venous system. 5. Mosaic attenuation within the pulmonary parenchyma and bronchial wall thickening in keeping with airway inflammation and multifocal air trapping. 6. 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 Clinical correlation for signs and symptoms of chronic mesenteric ischemia may be helpful. 7. Diffusely diminutive renal arteries bilaterally. Superimposed focal hemodynamically significant stenosis of the right renal artery origin. 8. Peripheral vascular disease with hemodynamically significant stenoses of the common iliac artery origins bilaterally and diffusely diseased lower extremity arterial inflow vasculature. 9. Morphologic changes suggesting cirrhosis. This could be confirmed with hepatic elastography or tissue sampling. Mild ascites. Aortic Atherosclerosis (ICD10-I70.0). Electronically Signed   By: Helyn Numbers M.D.   On: 03/13/2023 03:37   DG Chest Portable 1 View  Result Date: 03/13/2023 CLINICAL DATA:  Chest pain.  EXAM: PORTABLE CHEST 1 VIEW COMPARISON:  July 19, 2021 FINDINGS: Multiple sternal wires and vascular clips are noted. The cardiac silhouette is mildly enlarged. There is moderate severity calcification of the thoracic aorta. Low lung volumes are seen. Mild, diffuse, chronic appearing increased interstitial lung markings are seen. There is no evidence of acute infiltrate, pleural effusion or pneumothorax. The visualized skeletal structures are unremarkable. IMPRESSION: 1. Evidence of prior median sternotomy/CABG. 2. Low lung volumes without acute or active cardiopulmonary disease. Electronically Signed   By: Aram Candela M.D.   On: 03/13/2023 01:37    Procedures Procedures    Medications Ordered in ED Medications  sodium chloride 0.9 % bolus 500 mL (0 mLs Intravenous Hold 03/13/23 0305)  ondansetron (ZOFRAN) injection 4 mg (4 mg Intravenous Given 03/13/23 0128)  glucagon (human recombinant) (GLUCAGEN) injection 2 mg (2 mg Intravenous Given 03/13/23 0226)  iohexol (OMNIPAQUE) 350 MG/ML injection 80 mL (80 mLs Intravenous Contrast Given 03/13/23 0255)    ED Course/ Medical Decision Making/ A&P                             Medical Decision Making CP and shoulder neck pain   Amount and/or Complexity of Data Reviewed Independent Historian: EMS    Details: See above  External Data Reviewed: labs and notes.    Details: Previous notes and labs reviewed, creatinine is up from baseline  Labs: ordered.    Details: Normal white count 6.6 low hemoglobin 11.4.  elevated troponin 39 and 40.  Elevated BNP 1662.5, normal sodium 140, normal potassium  3.9, elevated creatinine 1.65, elevated ALT 42 and elevated AST 121 with elevated alk phosp 175.   Radiology: ordered and independent interpretation performed.    Details: No PE by me on CTA, CAD  Risk Prescription drug management. Decision regarding hospitalization. Risk Details: Will admit due to elevated cardiac enzymes.  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  Patient will need to see vascular surgery but they do not need to be called emergently.      Final Clinical Impression(s) / ED Diagnoses Final diagnoses:  Elevated troponin I level  AKI (acute kidney injury) (HCC)   The patient appears reasonably stabilized for admission considering the current resources, flow, and capabilities available in the ED at this time, and I doubt any other [facility] requiring further screening and/or treatment in the ED prior to admission.  Rx / DC Orders ED Discharge Orders     None         Kalonji Zurawski, MD 03/13/23 [phone]

## 2023-03-13 NOTE — H&P (Addendum)
History and Physical  Monica Zamora:[id number] DOB: 08-Mar-1951 DOA: 03/13/2023  Referring physician: Dr. Nicanor Alcon, EDP  PCP: Rebecka Apley, NP  Outpatient Specialists: Cardiology Patient coming from: Home  Chief Complaint: Chest pain.  HPI: Monica Zamora is a 72 y.o. female with medical history significant for paroxysmal A-fib on Eliquis, hypertension, chronic hypoxic respiratory failure on 2 L nasal cannula continuously, coronary artery disease status post CABG in 2021, carotid artery disease, HFrEF 40 to 45% who presented with complaints of sudden onset episode of substernal chest pain and epigastric pain.  The pain radiates to her right neck and right shoulder.  Associated with exertional dyspnea, worse with ambulation.  States she vomited once briefly, felt better then her symptoms recurred.  EMS was activated.  In the ED, the patient was hypotensive with SBP in the 70s and reportedly also bradycardic with heart rate in the 30s to 40s.  Her home beta-blocker was reversed with glucagon with improvement of her blood pressure and her heart rate.    Workup revealed findings suggestive of acute on chronic HFrEF with BNP greater than 1600, bilateral lower extremity edema, right JVD.  EDP requesting admission for further management of heart failure exacerbation.  The patient was admitted by [facility], hospitalist service, to telemetry cardiac unit as inpatient status for diuresing.  At the time of this visit the patient felt better and her chest pain was much improved.  High-sensitivity troponin 39, repeat 40.  ED Course: BP 121/77, heart rate 83, respiratory 17, O2 saturation 95% on 2 L.  Lab studies remarkable for hemoglobin 11.4.  Serum bicarb 19, creatinine 1.65.  Alkaline phosphatase 175, AST 121, GFR 33, BNP 1662, troponin 39, repeat 40.  Review of Systems: Review of systems as noted in the HPI. All other systems reviewed and are negative.   Past Medical History:   Diagnosis Date   A-fib (HCC)    COPD (chronic obstructive pulmonary disease) (HCC)    Coronary artery disease    Hx of CABG    Hypertension    PUD (peptic ulcer disease)    Past Surgical History:  Procedure Laterality Date   ABDOMINAL HYSTERECTOMY     CHOLECYSTECTOMY     CORONARY ARTERY BYPASS GRAFT     ruptured disc     SPINE SURGERY      Social History:  reports that she quit smoking about 7 months ago. Her smoking use included cigarettes. She started smoking about 55 years ago. She has a 81.00 pack-year smoking history. She has never used smokeless tobacco. She reports current alcohol use. She reports that she does not use drugs.   No Known Allergies  Family History  Problem Relation Age of Onset   Lupus Mother    Lung disease Neg Hx       Prior to Admission medications   Medication Sig Start Date End Date Taking? Authorizing Provider  acetaminophen (TYLENOL) 500 MG tablet Take 1,000 mg by mouth 2 (two) times daily as needed (pain).    [provider]  albuterol (VENTOLIN HFA) 108 (90 Base) MCG/ACT inhaler Inhale 1 puff into the lungs 2 (two) times daily as needed for wheezing or shortness of breath. 07/10/21   [provider]  ALPRAZolam Prudy Feeler) 0.5 MG tablet Take 1 tablet (0.5 mg total) by mouth at bedtime as needed for anxiety. 07/29/21   Georganna Skeans, MD  apixaban (ELIQUIS) 5 MG TABS tablet Take 1 tablet (5 mg total) by mouth 2 (two) times daily.  03/12/23   Jodelle Gross, NP  aspirin 81 MG chewable tablet Chew 81 mg by mouth daily.    [provider]  Budeson-Glycopyrrol-Formoterol (BREZTRI AEROSPHERE) 160-9-4.8 MCG/ACT AERO Inhale 2 puffs into the lungs in the morning and at bedtime. Patient not taking: Reported on 03/12/2023 02/27/23   Charlott Holler, MD  dronedarone (MULTAQ) 400 MG tablet Take 400 mg by mouth 2 (two) times daily with a meal. 05/15/22   [provider]  furosemide (LASIX) 20 MG tablet Take 3 tablets (60 mg  total) by mouth daily. 03/12/23   Jodelle Gross, NP  levalbuterol Pauline Aus) 1.25 MG/0.5ML nebulizer solution Take 1.25 mg by nebulization every 4 (four) hours as needed for wheezing or shortness of breath. Patient not taking: Reported on 03/12/2023 02/27/23   Charlott Holler, MD  metoprolol tartrate (LOPRESSOR) 25 MG tablet Take 0.5 tablets (12.5 mg total) by mouth 2 (two) times daily. 03/12/23   Jodelle Gross, NP  potassium chloride SA (KLOR-CON M) 20 MEQ tablet Take 1 tablet (20 mEq total) by mouth daily. 03/12/23   Jodelle Gross, NP  pravastatin (PRAVACHOL) 20 MG tablet Take 1 tablet (20 mg total) by mouth daily at 6 PM. 03/12/23 04/11/23  Jodelle Gross, NP  sertraline (ZOLOFT) 100 MG tablet Take 100 mg by mouth. 08/28/22   [provider]    Physical Exam: BP 107/75   Pulse 88   Temp 97.9 F (36.6 C) (Oral)   Resp 20   Ht 5\' 6"  (1.676 m)   Wt 81.2 kg   SpO2 93%   BMI 28.89 kg/m   General: 72 y.o. year-old female well developed well nourished in no acute distress.  Alert and oriented x3. Cardiovascular: Irregular rate and rhythm with no rubs or gallops.  No thyromegaly or JVD noted.  2+ pitting edema lower EXTR bilaterally. Respiratory: Mild rales at bases.  No wheezing noted.  Poor inspiratory effort. Abdomen: Soft nontender nondistended with normal bowel sounds x4 quadrants. Muskuloskeletal: No cyanosis or clubbing noted bilaterally Neuro: CN II-XII intact, strength, sensation, reflexes Skin: No ulcerative lesions noted or rashes Psychiatry: Judgement and insight appear normal. Mood is appropriate for condition and setting          Labs on Admission:  Basic Metabolic Panel: Recent Labs  Lab 03/13/23 0109  NA 140  K 3.9  CL 106  CO2 19*  GLUCOSE 113*  BUN 21  CREATININE 1.65*  CALCIUM 8.9   Liver Function Tests: Recent Labs  Lab 03/13/23 0109  AST 121*  ALT 42  ALKPHOS 175*  BILITOT 1.2  PROT 6.3*  ALBUMIN 3.1*   No results for  input(s): "LIPASE", "AMYLASE" in the last 168 hours. No results for input(s): "AMMONIA" in the last 168 hours. CBC: Recent Labs  Lab 03/13/23 0109  WBC 6.6  NEUTROABS 4.7  HGB 11.4*  HCT 37.7  MCV 111.5*  PLT 255   Cardiac Enzymes: No results for input(s): "CKTOTAL", "CKMB", "CKMBINDEX", "TROPONINI" in the last 168 hours.  BNP (last 3 results) Recent Labs    03/13/23 0109  BNP 1,662.5*    ProBNP (last 3 results) No results for input(s): "PROBNP" in the last 8760 hours.  CBG: No results for input(s): "GLUCAP" in the last 168 hours.  Radiological Exams on Admission: CT Angio Chest/Abd/Pel for Dissection W and/or Wo Contrast  Result Date: 03/13/2023 CLINICAL DATA:  Acute aortic syndrome, chest pain, epigastric abdominal pain EXAM: CT ANGIOGRAPHY CHEST, ABDOMEN AND PELVIS TECHNIQUE:  Non-contrast CT of the chest was initially obtained. Multidetector CT imaging through the chest, abdomen and pelvis was performed using the standard protocol during bolus administration of intravenous contrast. Multiplanar reconstructed images and MIPs were obtained and reviewed to evaluate the vascular anatomy. RADIATION DOSE REDUCTION: This exam was performed according to the departmental dose-optimization program which includes automated exposure control, adjustment of the mA and/or kV according to patient size and/or use of iterative reconstruction technique. CONTRAST:  80mL OMNIPAQUE IOHEXOL 350 MG/ML SOLN COMPARISON:  None Available. FINDINGS: CTA CHEST FINDINGS Cardiovascular: Extensive multi-vessel coronary artery calcification. Coronary artery bypass grafting has been performed. Global cardiac size within normal limits. No pericardial effusion. Central pulmonary arteries are enlarged in keeping with changes of pulmonary arterial hypertension. There is adequate opacification of the pulmonary arterial tree and no intraluminal filling defect is identified through the segmental level to suggest acute  pulmonary embolism. The thoracic aorta is normal in caliber; no aneurysm, intramural hematoma or dissection is identified. Moderate mixed atherosclerotic plaque is seen within the thoracic aorta. Mediastinum/Nodes: No enlarged mediastinal, hilar, or axillary lymph nodes. Thyroid gland, trachea, and esophagus demonstrate no significant findings. Lungs/Pleura: There is mosaic attenuation within the pulmonary parenchyma in keeping with multifocal air trapping related to this probable small airways disease. There is bronchial wall thickening present in keeping with airway inflammation. No confluent pulmonary infiltrate. No pneumothorax or pleural effusion. No central obstructing lesion. Musculoskeletal: Remote left fifth rib fracture noted anteriorly. No acute bone abnormality. No lytic or blastic bone lesion. Review of the MIP images confirms the above findings. CTA ABDOMEN AND PELVIS FINDINGS VASCULAR Aorta: Normal caliber. No aneurysm or dissection. Extensive atherosclerotic calcification with mild mural thrombus noted within the distal infrarenal abdominal aorta. No evidence of hemodynamically significant stenosis Celiac: Occluded at the origin with reconstitution via the gastroduodenal artery. Distal branches appear diminutive in caliber. Variant anatomy with early takeoff of the right hepatic artery. SMA: Moderate arteriosclerosis involving the mid and distal segments. No evidence of hemodynamically significant stenosis. No aneurysm or dissection. Renals: Single renal arteries are seen bilaterally demonstrating extensive arteriosclerosis. The renal arteries appear diminutive in caliber. Near occlusion noted involving the proximal right renal artery secondary to atherosclerotic calcification. The left renal artery appears diffusely diminutive in caliber without focal stenosis identified. Normal vascular morphology. IMA: High-grade stenosis at the origin. Distally diminutive but patent. Inflow: Extensive  atherosclerotic calcification results in hemodynamically significant stenoses of the common iliac artery origins bilaterally. The lower extremity arterial inflow vasculature is diffusely diseased and diminutive in caliber. Internal iliac arteries are heavily diseased and appear occluded at the origin on the left with relatively poor distal reconstitution and diminutive in caliber on the right. Veins: There is retrograde opacification of the hepatic venous system with mild dilation of the hepatocaval junction in keeping with changes of right heart failure. Review of the MIP images confirms the above findings. NON-VASCULAR Hepatobiliary: Subtly nodular contour of the liver suggests changes of cirrhosis. No enhancing intrahepatic mass. Cholecystectomy has been performed. No intra or extrahepatic biliary ductal dilation. Pancreas: Unremarkable Spleen: Unremarkable Adrenals/Urinary Tract: The adrenal glands are unremarkable. The kidneys are normal in position. Bilateral renal cortical scarring noted. No hydronephrosis. No intrarenal urinary or ureteral calculi identified. The bladder is decompressed and is unremarkable. Stomach/Bowel: Stomach, small bowel, and large bowel are unremarkable. Appendix absent. No free intraperitoneal gas. Mild ascites. Lymphatic: No pathologic adenopathy within the abdomen and pelvis. Reproductive: Status post hysterectomy. No adnexal masses. Other: Tiny fat containing bilateral inguinal  hernia. Musculoskeletal: Degenerative changes are seen within the lumbar spine. No acute bone abnormality. No lytic or blastic bone lesion. Review of the MIP images confirms the above findings. IMPRESSION: 1. No evidence of thoracoabdominal aortic aneurysm or dissection. 2. No pulmonary embolism identified. 3. Extensive multi-vessel coronary artery calcification, status post coronary artery bypass grafting. 4. Morphologic changes in keeping with pulmonary arterial hypertension and right heart failure with  retrograde opacification of the hepatic venous system. 5. Mosaic attenuation within the pulmonary parenchyma and bronchial wall thickening in keeping with airway inflammation and multifocal air trapping. 6. 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 Clinical correlation for signs and symptoms of chronic mesenteric ischemia may be helpful. 7. Diffusely diminutive renal arteries bilaterally. Superimposed focal hemodynamically significant stenosis of the right renal artery origin. 8. Peripheral vascular disease with hemodynamically significant stenoses of the common iliac artery origins bilaterally and diffusely diseased lower extremity arterial inflow vasculature. 9. Morphologic changes suggesting cirrhosis. This could be confirmed with hepatic elastography or tissue sampling. Mild ascites. Aortic Atherosclerosis (ICD10-I70.0). Electronically Signed   By: Helyn Numbers M.D.   On: 03/13/2023 03:37   DG Chest Portable 1 View  Result Date: 03/13/2023 CLINICAL DATA:  Chest pain. EXAM: PORTABLE CHEST 1 VIEW COMPARISON:  July 19, 2021 FINDINGS: Multiple sternal wires and vascular clips are noted. The cardiac silhouette is mildly enlarged. There is moderate severity calcification of the thoracic aorta. Low lung volumes are seen. Mild, diffuse, chronic appearing increased interstitial lung markings are seen. There is no evidence of acute infiltrate, pleural effusion or pneumothorax. The visualized skeletal structures are unremarkable. IMPRESSION: 1. Evidence of prior median sternotomy/CABG. 2. Low lung volumes without acute or active cardiopulmonary disease. Electronically Signed   By: Aram Candela M.D.   On: 03/13/2023 01:37    EKG: I independently viewed the EKG done and my findings are as followed: A-fib rate of 96.  Nonspecific ST-T changes.  QTc 498.  Assessment/Plan Present on Admission:  Acute on chronic systolic (congestive) heart failure  (HCC)  Principal Problem:   Acute on chronic systolic (congestive) heart failure (HCC)  Acute on chronic HFrEF 40-45% Last 2D echo done on 03/04/2023 revealed LVEF 4045%, left ventricle global hypokinesis Presented with volume overload on exam, right JVD, bilateral lower extremity pitting edema, BNP greater than 1600 IV Lasix 20 mg x 2 with midodrine for pressure support Start strict I's and O's and daily weight Consider consulting cardiology in the morning.  Atypical chest pain Elevated troponin, suspect demand ischemia in the setting of hypotension Troponin flat, 39, repeat 40 No evidence of acute ischemia seen on twelve-lead EKG Monitor on telemetry  AKI on CKD 3A, suspect in the setting of hypotension Mild non-anion gap metabolic acidosis with serum bicarb of 19 and anion gap of 15 likely secondary to acute renal insufficiency Baseline creatinine appears to be 1.1 with GFR 51 Presented with creatinine of 1.64 with GFR of 33 Avoid nephrotoxic agents and hypotension Monitor urine output Repeat BMP in the morning  Elevated liver chemistries, unclear etiology, suspect related to alcohol use AST to ALT ratio 2:1 Monitor for now Avoid hepatotoxic agents  Permanent atrial fibrillation, currently rate controlled, on Eliquis Initially presented hypotensive and bradycardic, home beta-blocker was reversed with glucagon Rate control agent has been held Resume home Eliquis Closely monitor on telemetry Consult cardiology in the morning  Chronic anxiety/depression Resume home Zoloft  Chronic hypoxic respiratory failure on 2 L nasal cannula continuously  Appears to be at her baseline oxygen requirement Maintain O2 saturation above 90%      DVT prophylaxis: Home Eliquis  Code Status: Full code  Family Communication: A friend at bedside  Disposition Plan: Admitted to telemetry cardiac unit  Consults called: None.  Admission status: Inpatient status.   Status is:  Inpatient The patient requires at least 2 midnights for further evaluation and treatment of present condition.   Darlin Drop MD [facility] Pager [phone]  If 7PM-7AM, please contact night-coverage [email or web address] Password [email or web address]  03/13/2023, 5:50 AM

## 2023-03-13 NOTE — Progress Notes (Signed)
   Heart Failure Stewardship Pharmacist Progress Note   PCP: Rebecka Apley, NP PCP-Cardiologist: Olga Millers, MD    HPI:  72 yo F with PMH of CHF, afib, HTN, chronic hypoxic respiratory failure, CAD s/p CABG, and carotid artery disease.  Was seen in cardiology on 5/14 and complained of worsening dyspnea on exertion. Noted increased LE edema. ECHO 6/19 showed LVEF reduced to 40-45%, global hypokinesis, RV mildly reduced, moderate MR.  Followed up with cardiology on 6/27. HR was elevated to 90-100s during visit. She was not taking her metoprolol as prescribed. Advised to start taking it twice daily. Son was going to prepare med box for her.   She presented to the ED on 6/28 with new onset chest pain and epigastric pain associated with exertional dyspnea. In the ED, patient was hypotensive with SBP in the 70s and bradycardic with rates 30-40s. BB was reversed with glucagon with improvement in BP and HR. BNP was also elevated and noted to have bilateral LE edema and JVD. CXR without active cardiopulmonary disease. CTA without aneurysm or dissection, no PE. Notable for morphologic changes with pulmonary arterial hypertension and right heart failure.  Current HF Medications: Diuretic: furosemide 20 mg IV BID  Prior to admission HF Medications: Diuretic: furosemide 60 mg daily Beta blocker: metoprolol tartrate 12.5 mg BID  Pertinent Lab Values: Serum creatinine 1.65, BUN 21, Potassium 3.9, Sodium 140, BNP 1662.5  Vital Signs: Weight: 179 lbs (admission weight: 179 lbs) Blood pressure: 100/70s  Heart rate: 70-80s  I/O: not yet documented  Medication Assistance / Insurance Benefits Check: Does the patient have prescription insurance?  Yes Type of insurance plan: [place] [employer]  Outpatient Pharmacy:  Prior to admission outpatient pharmacy: [facility] - offers delivery service Is the patient willing to use [facility] pharmacy at discharge? Yes Is the patient willing to transition  their outpatient pharmacy to utilize a [facility] outpatient pharmacy?   No    Assessment: 1. Acute on chronic HFmrEF (LVEF 40-45%). NYHA class III symptoms. - Continue furosemide 20 mg IV BID. Strict I/Os and daily weights. Still complaining of shortness of breath and LE edema L>R. Keep K>4. Check magnesium in AM.  - Holding BB as she was bradycardic requiring glucagon reversal in the ED - BP soft. If improves, may be able to start spironolactone - Consider starting SGLT2i prior to discharge - Is taking dronedarone 400 mg BID. With new hospitalization for HF, caution continuation of therapy as this can cause new or worsening heart failure.    Plan: 1) Medication changes recommended at this time: - Continue IV diuresis - Consider stopping dronaderone and switching to alternative afib therapy with HF exacerbation   2) Patient assistance: - Farxiga/Jardiance copay $0 - Entresto copay $0  3)  Education  - Patient has been educated on current HF medications and potential additions to HF medication regimen - Patient verbalizes understanding that over the next few months, these medication doses may change and more medications may be added to optimize HF regimen - Patient has been educated on basic disease state pathophysiology and goals of therapy   Sharen Hones, PharmD, BCPS Heart Failure Stewardship Pharmacist Phone [phone]

## 2023-03-13 NOTE — Evaluation (Signed)
Physical Therapy Evaluation Patient Details Name: Monica Zamora MRN: [id number] DOB: [birth year] Today's Date: 03/13/2023  History of Present Illness  72 yo female admitted 6/28 with chest pain, bradycardia, hypotension and acute on chronic CHF. PMHx: Afib on Eliquis, HTN, respiratory failure on 2L, CAD s/p CABG, HFrEF  Clinical Impression  Pt pleasant and reports not using O2 at home and not having pulse oximeter. Pt requriring 1-2L at rest to maintain SPO2 >90% and 4L with gait to maintain SPO2 >90%. Pt with decreased activity tolerance and function who will benefit from acute therapy to maximize mobility, safety and independence.      Recommendations for follow up therapy are one component of a multi-disciplinary discharge planning process, led by the attending physician.  Recommendations may be updated based on patient status, additional functional criteria and insurance authorization.  Follow Up Recommendations       Assistance Recommended at Discharge Intermittent Supervision/Assistance  Patient can return home with the following  A little help with bathing/dressing/bathroom;Assistance with cooking/housework;Assist for transportation    Equipment Recommendations BSC/3in1, pulse oximeter  Recommendations for Other Services       Functional Status Assessment Patient has had a recent decline in their functional status and/or demonstrates limited ability to make significant improvements in function in a reasonable and predictable amount of time     Precautions / Restrictions Precautions Precautions: Fall;Other (comment) Precaution Comments: watch sats      Mobility  Bed Mobility Overal bed mobility: Modified Independent                  Transfers Overall transfer level: Modified independent                 General transfer comment: pt able to rise from EOb and chair without armrests or assist    Ambulation/Gait Ambulation/Gait assistance: Min  guard Gait Distance (Feet): 30 Feet Assistive device: Rolling walker (2 wheels) Gait Pattern/deviations: Step-through pattern, Decreased stride length   Gait velocity interpretation: 1.31 - 2.62 ft/sec, indicative of limited community ambulator   General Gait Details: cues for posture and breathing technique. Pt initially walked 8' on 1L with desaturation to 79% and report of dizziness requiring seated rest. Pt required 4L for 30' of gait to maintin 92%  Stairs            Wheelchair Mobility    Modified Rankin (Stroke Patients Only)       Balance Overall balance assessment: Needs assistance   Sitting balance-Leahy Scale: Good     Standing balance support: Bilateral upper extremity supported, During functional activity, Reliant on assistive device for balance Standing balance-Leahy Scale: Poor Standing balance comment: RW for gait                             Pertinent Vitals/Pain Pain Assessment Pain Assessment: No/denies pain    Home Living Family/patient expects to be discharged to:: Private residence Living Arrangements: Non-relatives/Friends Available Help at Discharge: Friend(s);Available PRN/intermittently Type of Home: House Home Access: Stairs to enter   Entrance Stairs-Number of Steps: 1   Home Layout: One level Home Equipment: Educational psychologist (4 wheels) Additional Comments: Pt lives with fiance of her daughter who passed away, he works    Prior Function Prior Level of Function : Independent/Modified Independent             Mobility Comments: walks with rollator, progressive decline in gait tolerance lately ADLs Comments: reports independence with  ADLs with increased time and sitting to shower, does not drive     Hand Dominance        Extremity/Trunk Assessment   Upper Extremity Assessment Upper Extremity Assessment: Generalized weakness    Lower Extremity Assessment Lower Extremity Assessment: Generalized weakness     Cervical / Trunk Assessment Cervical / Trunk Assessment: Kyphotic  Communication   Communication: No difficulties  Cognition Arousal/Alertness: Awake/alert Behavior During Therapy: WFL for tasks assessed/performed Overall Cognitive Status: Within Functional Limits for tasks assessed                                          General Comments      Exercises     Assessment/Plan    PT Assessment Patient needs continued PT services  PT Problem List Decreased activity tolerance;Decreased balance;Decreased mobility;Cardiopulmonary status limiting activity       PT Treatment Interventions DME instruction;Gait training;Functional mobility training;Therapeutic activities;Patient/family education;Therapeutic exercise    PT Goals (Current goals can be found in the Care Plan section)  Acute Rehab PT Goals Patient Stated Goal: return to vacuuming and walking more PT Goal Formulation: With patient Time For Goal Achievement: 03/27/23 Potential to Achieve Goals: Good    Frequency Min 1X/week     Co-evaluation               AM-PAC PT "6 Clicks" Mobility  Outcome Measure Help needed turning from your back to your side while in a flat bed without using bedrails?: A Little Help needed moving from lying on your back to sitting on the side of a flat bed without using bedrails?: A Little Help needed moving to and from a bed to a chair (including a wheelchair)?: A Little Help needed standing up from a chair using your arms (e.g., wheelchair or bedside chair)?: A Little Help needed to walk in hospital room?: A Little Help needed climbing 3-5 steps with a railing? : A Lot 6 Click Score: 17    End of Session Equipment Utilized During Treatment: Oxygen Activity Tolerance: Patient tolerated treatment well Patient left: in chair;with call bell/phone within reach Nurse Communication: Mobility status PT Visit Diagnosis: Other abnormalities of gait and mobility  (R26.89);Difficulty in walking, not elsewhere classified (R26.2)    Time: 5409-8119 PT Time Calculation (min) (ACUTE ONLY): 22 min   Charges:   PT Evaluation $PT Eval Moderate Complexity: 1 Mod          Stefani Baik P, PT Acute Rehabilitation Services Office: [phone]   Cristine Polio 03/13/2023, 10:56 AM

## 2023-03-13 NOTE — Progress Notes (Signed)
   03/13/23 0616  Vitals  Temp 97.7 F (36.5 C)  Temp Source Oral  BP 106/77  MAP (mmHg) 85  BP Location Left Arm  BP Method Automatic  Patient Position (if appropriate) Lying  Pulse Rate (!) 105  Pulse Rate Source Monitor  ECG Heart Rate 100  Resp 12  Level of Consciousness  Level of Consciousness Alert  MEWS COLOR  MEWS Score Color Green  Oxygen Therapy  SpO2 92 %  O2 Device Nasal Cannula  O2 Flow Rate (L/min) 1 L/min  MEWS Score  MEWS Temp 0  MEWS Systolic 0  MEWS Pulse 0  MEWS RR 1  MEWS LOC 0  MEWS Score 1   Pt admitted to [facility]. Pt oriented to unit. Call light by pt. CHG bath given. CCMD called. Pt denies pain.

## 2023-03-13 NOTE — Progress Notes (Signed)
ANTICOAGULATION CONSULT NOTE - Initial Consult  Pharmacy Consult for Eliquis >> IV heparin Indication: atrial fibrillation  No Known Allergies  Patient Measurements: Height: 5\' 6"  (167.6 cm) Weight: 81.2 kg (179 lb) IBW/kg (Calculated) : 59.3 Heparin Dosing Weight: 76.2 kg  Vital Signs: Temp: 97.6 F (36.4 C) (06/28 1530) Temp Source: Oral (06/28 1530) BP: 122/82 (06/28 1530) Pulse Rate: 88 (06/28 1530)  Labs: Recent Labs    03/13/23 0109 03/13/23 0258  HGB 11.4*  --   HCT 37.7  --   PLT 255  --   CREATININE 1.65*  --   TROPONINIHS 39* 40*    Estimated Creatinine Clearance: 33.6 mL/min (A) (by C-G formula based on SCr of 1.65 mg/dL (H)).   Medical History: Past Medical History:  Diagnosis Date   A-fib (HCC)    Carotid artery disease (HCC)    COPD (chronic obstructive pulmonary disease) (HCC)    Coronary artery disease    Former tobacco use    Heart failure with mildly reduced ejection fraction (HFmrEF) (HCC)    Hx of CABG    Hypertension    PUD (peptic ulcer disease)     Medications:  Scheduled:   furosemide  20 mg Intravenous BID   pravastatin  20 mg Oral q1800   sertraline  100 mg Oral Daily    Assessment: 72 yo female presented with chest pain, epigastric pain, and emesis. Patient is on Eliquis 5mg  BID PTA for hx Afib. Eliquis was resumed on admission and last dose was 6/28 @ 0900. Pharmacy has been consulted to transition Eliquis to IV heparin with plans for cardiac cath.  Baseline aPTT and HL ordered. Hgb 11.4, plts 255 - stable. Utilize aPTT for heparin dosing and Eliquis can affect heparin levels  Goal of Therapy:  Heparin level 0.3-0.7 units/ml aPTT 66-102 seconds Monitor platelets by anticoagulation protocol: Yes   Plan:  No bolus since recent Eliquis administration Start heparin drip at 1050 units/hr this evening at 2200 (~12h post Eliquis dose) aPTT 8 hours after heparin drip starts Daily HL, aPTT, CBC F/u cath plans  Rexford Maus, PharmD, BCPS 03/13/2023 4:51 PM

## 2023-03-13 NOTE — ED Triage Notes (Signed)
Patient BIB EMS from home due to chest pain and epigastric pain. Patient rates pain 2/10. Patient reports pain is also in right side of neck. Patient states that she vomited and briefly took pain away but then came back. Patient has hx afib. Patient endorses SOB when ambulating. Patient given 100 mL NS, 325 aspirin, and 4mg  zofran. Patient A&Ox4.

## 2023-03-13 NOTE — Progress Notes (Signed)
Brief progress note: -Patient is a 72 year old female with history of coronary artery disease status post CABG, hypertension and atrial fibrillation.  Patient was admitted with lower sternal/epigastric chest pain radiating to the neck.  Associated nausea and vomiting.  Flat troponin.  EKG reveals A-fib with nonspecific T wave changes.  AKI on CKD 3 is noted.  As per H&P done earlier today by Dr. Raphael Gibney: " Monica Zamora is a 72 y.o. female with medical history significant for paroxysmal A-fib on Eliquis, hypertension, chronic hypoxic respiratory failure on 2 L nasal cannula continuously, coronary artery disease status post CABG in 2021, carotid artery disease, HFrEF 40 to 45% who presented with complaints of sudden onset episode of substernal chest pain and epigastric pain.  The pain radiates to her right neck and right shoulder.  Associated with exertional dyspnea, worse with ambulation.  States she vomited once briefly, felt better then her symptoms recurred.  EMS was activated.   In the ED, the patient was hypotensive with SBP in the 70s and reportedly also bradycardic with heart rate in the 30s to 40s.  Her home beta-blocker was reversed with glucagon with improvement of her blood pressure and her heart rate.     Workup revealed findings suggestive of acute on chronic HFrEF with BNP greater than 1600, bilateral lower extremity edema, right JVD.  EDP requesting admission for further management of heart failure exacerbation.  The patient was admitted by [facility], hospitalist service, to telemetry cardiac unit as inpatient status for diuresing.   At the time of this visit the patient felt better and her chest pain was much improved.  High-sensitivity troponin 39, repeat 40.   ED Course: BP 121/77, heart rate 83, respiratory 17, O2 saturation 95% on 2 L.  Lab studies remarkable for hemoglobin 11.4.  Serum bicarb 19, creatinine 1.65"  03/13/2023: Will consult cardiology team, considering history  above.  Monitor renal function.  Further management will depend on hospital course

## 2023-03-14 DIAGNOSIS — I4819 Other persistent atrial fibrillation: Secondary | ICD-10-CM

## 2023-03-14 DIAGNOSIS — J9601 Acute respiratory failure with hypoxia: Secondary | ICD-10-CM

## 2023-03-14 DIAGNOSIS — I739 Peripheral vascular disease, unspecified: Secondary | ICD-10-CM | POA: Diagnosis not present

## 2023-03-14 DIAGNOSIS — I5023 Acute on chronic systolic (congestive) heart failure: Secondary | ICD-10-CM | POA: Diagnosis not present

## 2023-03-14 LAB — CBC WITH DIFFERENTIAL/PLATELET
Abs Immature Granulocytes: 0.02 10*3/uL (ref 0.00–0.07)
Basophils Absolute: 0.1 10*3/uL (ref 0.0–0.1)
Basophils Relative: 2 %
Eosinophils Absolute: 0.1 10*3/uL (ref 0.0–0.5)
Eosinophils Relative: 4 %
HCT: 32.8 % — ABNORMAL LOW (ref 36.0–46.0)
Hemoglobin: 10.2 g/dL — ABNORMAL LOW (ref 12.0–15.0)
Immature Granulocytes: 1 %
Lymphocytes Relative: 22 %
Lymphs Abs: 0.9 10*3/uL (ref 0.7–4.0)
MCH: 34.2 pg — ABNORMAL HIGH (ref 26.0–34.0)
MCHC: 31.1 g/dL (ref 30.0–36.0)
MCV: 110.1 fL — ABNORMAL HIGH (ref 80.0–100.0)
Monocytes Absolute: 0.3 10*3/uL (ref 0.1–1.0)
Monocytes Relative: 8 %
Neutro Abs: 2.5 10*3/uL (ref 1.7–7.7)
Neutrophils Relative %: 63 %
Platelets: 174 10*3/uL (ref 150–400)
RBC: 2.98 MIL/uL — ABNORMAL LOW (ref 3.87–5.11)
RDW: 15.7 % — ABNORMAL HIGH (ref 11.5–15.5)
WBC: 3.9 10*3/uL — ABNORMAL LOW (ref 4.0–10.5)
nRBC: 0 % (ref 0.0–0.2)

## 2023-03-14 LAB — URINALYSIS, ROUTINE W REFLEX MICROSCOPIC
Bilirubin Urine: NEGATIVE
Glucose, UA: NEGATIVE mg/dL
Hgb urine dipstick: NEGATIVE
Ketones, ur: NEGATIVE mg/dL
Leukocytes,Ua: NEGATIVE
Nitrite: NEGATIVE
Protein, ur: NEGATIVE mg/dL
Specific Gravity, Urine: 1.008 (ref 1.005–1.030)
pH: 5 (ref 5.0–8.0)

## 2023-03-14 LAB — COMPREHENSIVE METABOLIC PANEL
ALT: 37 U/L (ref 0–44)
AST: 41 U/L (ref 15–41)
Albumin: 2.9 g/dL — ABNORMAL LOW (ref 3.5–5.0)
Alkaline Phosphatase: 145 U/L — ABNORMAL HIGH (ref 38–126)
Anion gap: 7 (ref 5–15)
BUN: 33 mg/dL — ABNORMAL HIGH (ref 8–23)
CO2: 25 mmol/L (ref 22–32)
Calcium: 8.3 mg/dL — ABNORMAL LOW (ref 8.9–10.3)
Chloride: 104 mmol/L (ref 98–111)
Creatinine, Ser: 2.15 mg/dL — ABNORMAL HIGH (ref 0.44–1.00)
GFR, Estimated: 24 mL/min — ABNORMAL LOW (ref >60)
Glucose, Bld: 93 mg/dL (ref 70–99)
Potassium: 3.9 mmol/L (ref 3.5–5.1)
Sodium: 136 mmol/L (ref 135–145)
Total Bilirubin: 0.7 mg/dL (ref 0.3–1.2)
Total Protein: 5.7 g/dL — ABNORMAL LOW (ref 6.5–8.1)

## 2023-03-14 LAB — LIPID PANEL
Cholesterol: 135 mg/dL (ref 0–200)
HDL: 36 mg/dL — ABNORMAL LOW (ref >40)
LDL Cholesterol: 82 mg/dL (ref 0–99)
Total CHOL/HDL Ratio: 3.8 RATIO
Triglycerides: 86 mg/dL (ref <150)
VLDL: 17 mg/dL (ref 0–40)

## 2023-03-14 LAB — APTT
aPTT: 64 seconds — ABNORMAL HIGH (ref 24–36)
aPTT: 64 seconds — ABNORMAL HIGH (ref 24–36)

## 2023-03-14 LAB — PHOSPHORUS: Phosphorus: 4.6 mg/dL (ref 2.5–4.6)

## 2023-03-14 LAB — MAGNESIUM: Magnesium: 2 mg/dL (ref 1.7–2.4)

## 2023-03-14 LAB — LACTIC ACID, PLASMA
Lactic Acid, Venous: 1.2 mmol/L (ref 0.5–1.9)
Lactic Acid, Venous: 1.1 mmol/L (ref 0.5–1.9)

## 2023-03-14 LAB — SODIUM, URINE, RANDOM: Sodium, Ur: 109 mmol/L

## 2023-03-14 LAB — HEPARIN LEVEL (UNFRACTIONATED): Heparin Unfractionated: >1.1 IU/mL — ABNORMAL HIGH (ref 0.30–0.70)

## 2023-03-14 MED ORDER — HYDRALAZINE HCL 25 MG PO TABS
25.0000 mg | ORAL_TABLET | Freq: Three times a day (TID) | ORAL | Status: DC
  Administered 2023-03-14 – 2023-03-19 (×11): 25 mg via ORAL
  Filled 2023-03-14 (×13): qty 1

## 2023-03-14 MED ORDER — METOPROLOL TARTRATE 25 MG PO TABS
25.0000 mg | ORAL_TABLET | Freq: Two times a day (BID) | ORAL | Status: DC
  Administered 2023-03-14 – 2023-03-18 (×9): 25 mg via ORAL
  Filled 2023-03-14 (×9): qty 1

## 2023-03-14 MED ORDER — ATORVASTATIN CALCIUM 40 MG PO TABS
40.0000 mg | ORAL_TABLET | Freq: Every day | ORAL | Status: DC
  Administered 2023-03-14 – 2023-03-25 (×12): 40 mg via ORAL
  Filled 2023-03-14 (×12): qty 1

## 2023-03-14 MED ORDER — FUROSEMIDE 10 MG/ML IJ SOLN
60.0000 mg | Freq: Two times a day (BID) | INTRAMUSCULAR | Status: DC
  Administered 2023-03-14 – 2023-03-15 (×3): 60 mg via INTRAVENOUS
  Filled 2023-03-14 (×4): qty 6

## 2023-03-14 NOTE — Progress Notes (Signed)
Mobility Specialist: Progress Note   03/14/23 1243  Mobility  Activity Refused mobility   Attempted to see pt x2 today. Pt refused mobility stating she has already had therapy today. After talking pt agreeable to ambulation but just started lasix and would like this mobility specialist to come back. Will f/u as able.   Ronelle Smallman Mobility Specialist Please contact via SecureChat or Rehab office at [phone]

## 2023-03-14 NOTE — Evaluation (Signed)
Occupational Therapy Evaluation Patient Details Name: Monica Zamora MRN: [id number] DOB: Mar 28, 1951 Today's Date: 03/14/2023   History of Present Illness 71 yo female admitted 6/28 with chest pain, bradycardia, hypotension and acute on chronic CHF. PMHx: Afib on Eliquis, HTN, respiratory failure on 2L, CAD s/p CABG, HFrEF   Clinical Impression   Pt reports ind with ADLs and uses rollator for mobility, reports she is ind with IADLS (although does not drive), but they are getting progressively difficult due to fatigue. Pt currently needing set up - min A for ADLs, mod I for bed mobility and min guard for transfers with RW. HR up to 145bpm with seated bathing task at sink, RN notified. Pt presenting with impairments listed below, will follow acutely. Recommend HHOT at d/c.       Recommendations for follow up therapy are one component of a multi-disciplinary discharge planning process, led by the attending physician.  Recommendations may be updated based on patient status, additional functional criteria and insurance authorization.   Assistance Recommended at Discharge Intermittent Supervision/Assistance  Patient can return home with the following A little help with walking and/or transfers;A little help with bathing/dressing/bathroom;Assistance with cooking/housework;Direct supervision/assist for medications management;Direct supervision/assist for financial management;Assist for transportation;Help with stairs or ramp for entrance    Functional Status Assessment  Patient has had a recent decline in their functional status and demonstrates the ability to make significant improvements in function in a reasonable and predictable amount of time.  Equipment Recommendations  None recommended by OT (pt has all needed DME)    Recommendations for Other Services PT consult     Precautions / Restrictions Precautions Precautions: Fall;Other (comment) Precaution Comments: watch  sats Restrictions Weight Bearing Restrictions: No      Mobility Bed Mobility Overal bed mobility: Modified Independent                  Transfers Overall transfer level: Needs assistance Equipment used: Rolling walker (2 wheels) Transfers: Sit to/from Stand Sit to Stand: Min guard                  Balance Overall balance assessment: Needs assistance Sitting-balance support: Bilateral upper extremity supported Sitting balance-Leahy Scale: Good     Standing balance support: Bilateral upper extremity supported, During functional activity, Reliant on assistive device for balance Standing balance-Leahy Scale: Poor Standing balance comment: RW for gait                           ADL either performed or assessed with clinical judgement   ADL Overall ADL's : Needs assistance/impaired Eating/Feeding: Set up;Sitting   Grooming: Set up;Sitting   Upper Body Bathing: Set up;Sitting   Lower Body Bathing: Minimal assistance;Sitting/lateral leans   Upper Body Dressing : Set up;Sitting   Lower Body Dressing: Minimal assistance;Sitting/lateral leans   Toilet Transfer: Min guard;Ambulation;Regular Toilet;Rolling walker (2 wheels)   Toileting- Clothing Manipulation and Hygiene: Min guard       Functional mobility during ADLs: Min guard;Rolling walker (2 wheels)       Vision   Vision Assessment?: No apparent visual deficits     Perception Perception Perception Tested?: No   Praxis Praxis Praxis tested?: Not tested    Pertinent Vitals/Pain Pain Assessment Pain Assessment: No/denies pain     Hand Dominance Left   Extremity/Trunk Assessment Upper Extremity Assessment Upper Extremity Assessment: Generalized weakness   Lower Extremity Assessment Lower Extremity Assessment: Defer to PT evaluation  Cervical / Trunk Assessment Cervical / Trunk Assessment: Kyphotic   Communication Communication Communication: No difficulties   Cognition  Arousal/Alertness: Awake/alert Behavior During Therapy: WFL for tasks assessed/performed Overall Cognitive Status: Within Functional Limits for tasks assessed                                       General Comments  HR up to 145bpm with seated bathing task    Exercises     Shoulder Instructions      Home Living Family/patient expects to be discharged to:: Private residence Living Arrangements: Non-relatives/Friends Available Help at Discharge: Friend(s);Available PRN/intermittently Type of Home: House Home Access: Stairs to enter [employer] of Steps: 1   Home Layout: One level     Bathroom Shower/Tub: Chief Strategy Officer: Standard     Home Equipment: Educational psychologist (4 wheels)   Additional Comments: Pt lives with fiance of her daughter who passed away, he works. wears 2L O2 for ~1 hr per day and PRN at home      Prior Functioning/Environment Prior Level of Function : Independent/Modified Independent             Mobility Comments: walks with rollator, progressive decline in gait tolerance lately ADLs Comments: reports independence with ADLs with increased time and sitting to shower, does not drive, difficulty with IADLs due to fatigue; manages own meds        OT Problem List: Decreased strength;Decreased range of motion;Decreased activity tolerance;Impaired balance (sitting and/or standing);Decreased safety awareness      OT Treatment/Interventions: Self-care/ADL training;Therapeutic exercise;Energy conservation;DME and/or AE instruction;Therapeutic activities;Patient/family education;Balance training    OT Goals(Current goals can be found in the care plan section) Acute Rehab OT Goals Patient Stated Goal: none stated OT Goal Formulation: With patient Time For Goal Achievement: 03/28/23 Potential to Achieve Goals: Good ADL Goals Pt Will Perform Lower Body Dressing: with modified independence;sitting/lateral  leans;sit to/from stand Pt Will Perform Tub/Shower Transfer: Shower transfer;shower seat;ambulating;rolling walker Additional ADL Goal #1: pt will verbalize x3 energy conservation strategies in prep for ADLs  OT Frequency: Min 1X/week    Co-evaluation              AM-PAC OT "6 Clicks" Daily Activity     Outcome Measure Help from another person eating meals?: A Little Help from another person taking care of personal grooming?: A Little Help from another person toileting, which includes using toliet, bedpan, or urinal?: A Little Help from another person bathing (including washing, rinsing, drying)?: A Lot Help from another person to put on and taking off regular upper body clothing?: A Little Help from another person to put on and taking off regular lower body clothing?: A Little 6 Click Score: 17   End of Session Equipment Utilized During Treatment: Gait belt;Rolling walker (2 wheels);Oxygen Nurse Communication: Mobility status  Activity Tolerance: Patient tolerated treatment well Patient left: with call bell/phone within reach;in chair;with chair alarm set  OT Visit Diagnosis: Unsteadiness on feet (R26.81);Other abnormalities of gait and mobility (R26.89);Muscle weakness (generalized) (M62.81)                Time: 1610-9604 OT Time Calculation (min): 32 min Charges:  OT General Charges $OT Visit: 1 Visit OT Evaluation $OT Eval Moderate Complexity: 1 Mod OT Treatments $Self Care/Home Management : 8-22 mins  Liani Caris K, OTD, OTR/L SecureChat Preferred Acute Rehab [phone]  Dalphine Handing 03/14/2023, 1:09 PM

## 2023-03-14 NOTE — Plan of Care (Signed)

## 2023-03-14 NOTE — Progress Notes (Signed)
ANTICOAGULATION CONSULT NOTE - Initial Consult  Pharmacy Consult for Eliquis >> IV heparin Indication: atrial fibrillation  No Known Allergies  Patient Measurements: Height: 5\' 6"  (167.6 cm) Weight: 81.1 kg (178 lb 12.7 oz) IBW/kg (Calculated) : 59.3 Heparin Dosing Weight: 76.2 kg  Vital Signs: Temp: 97.7 F (36.5 C) (06/29 0719) Temp Source: Oral (06/29 0719) BP: 122/77 (06/29 0719) Pulse Rate: 62 (06/29 0719)  Labs: Recent Labs    03/13/23 0109 03/13/23 0258 03/13/23 1758 03/14/23 0557  HGB 11.4*  --   --  10.2*  HCT 37.7  --   --  32.8*  PLT 255  --   --  174  APTT  --   --  38* 64*  HEPARINUNFRC  --   --  >1.10* >1.10*  CREATININE 1.65*  --   --  2.15*  TROPONINIHS 39* 40*  --   --      Estimated Creatinine Clearance: 25.8 mL/min (A) (by C-G formula based on SCr of 2.15 mg/dL (H)).   Medical History: Past Medical History:  Diagnosis Date   A-fib (HCC)    Carotid artery disease (HCC)    COPD (chronic obstructive pulmonary disease) (HCC)    Coronary artery disease    Former tobacco use    Heart failure with mildly reduced ejection fraction (HFmrEF) (HCC)    Hx of CABG    Hypertension    PUD (peptic ulcer disease)     Medications:  Scheduled:   aspirin EC  81 mg Oral Daily   pravastatin  20 mg Oral q1800   sertraline  100 mg Oral Daily    Assessment: 72 yo female presented with chest pain, epigastric pain, and emesis. Patient is on Eliquis 5mg  BID PTA for hx Afib. Eliquis was resumed on admission and last dose was 6/28 @ 0900. Pharmacy has been consulted to transition Eliquis to IV heparin with plans for cardiac cath.  Baseline aPTT and HL ordered. Hgb 11.4, plts 255 - stable. Utilize aPTT for heparin dosing and Eliquis can affect heparin levels  HL elevated. PTT came back slightly subtherapeutic at 64. We will increase and check another level.   Goal of Therapy:  Heparin level 0.3-0.7 units/ml aPTT 66-102 seconds Monitor platelets by  anticoagulation protocol: Yes   Plan:  Increase heparin to 1150 units/hr  aPTT 8 hours  Daily HL, aPTT, CBC F/u cath plans  Ulyses Southward, PharmD, BCIDP, AAHIVP, CPP Infectious Disease Pharmacist 03/14/2023 7:22 AM

## 2023-03-14 NOTE — Plan of Care (Signed)
  Problem: Education: Goal: Knowledge of General Education information will improve Description: Including pain rating scale, medication(s)/side effects and non-pharmacologic comfort measures Outcome: Progressing   Problem: Health Behavior/Discharge Planning: Goal: Ability to manage health-related needs will improve Outcome: Progressing   Problem: Clinical Measurements: Goal: Ability to maintain clinical measurements within normal limits will improve Outcome: Progressing Goal: Will remain free from infection Outcome: Progressing Goal: Diagnostic test results will improve Outcome: Progressing   Problem: Pain Managment: Goal: General experience of comfort will improve Outcome: Progressing   Problem: Safety: Goal: Ability to remain free from injury will improve Outcome: Progressing   Problem: Skin Integrity: Goal: Risk for impaired skin integrity will decrease Outcome: Progressing   

## 2023-03-14 NOTE — Progress Notes (Signed)
Cardiology Progress Note  Patient ID: VEANNA PETTWAY MRN: [id number] DOB: 1951-06-24 Date of Encounter: 03/14/2023  Primary Cardiologist: Olga Millers, MD  Subjective   Chief Complaint: SOB  HPI: Appears volume up.  Serum creatinine rising.  A-fib uncontrolled.  ROS:  All other ROS reviewed and negative. Pertinent positives noted in the HPI.     Inpatient Medications  Scheduled Meds:  aspirin EC  81 mg Oral Daily   furosemide  60 mg Intravenous BID   metoprolol tartrate  25 mg Oral BID   pravastatin  20 mg Oral q1800   sertraline  100 mg Oral Daily   Continuous Infusions:  heparin 1,150 Units/hr (03/14/23 0832)   sodium chloride Stopped (03/13/23 0305)   PRN Meds: acetaminophen, ALPRAZolam, melatonin, polyethylene glycol, prochlorperazine   Vital Signs   Vitals:   03/14/23 0530 03/14/23 0531 03/14/23 0543 03/14/23 0719  BP: 119/80   122/77  Pulse: 98 100  62  Resp: 18   18  Temp:    97.7 F (36.5 C)  TempSrc:    Oral  SpO2: 91% 94%  97%  Weight:   81.1 kg   Height:        Intake/Output Summary (Last 24 hours) at 03/14/2023 1020 Last data filed at 03/14/2023 0900 Gross per 24 hour  Intake 341.08 ml  Output 450 ml  Net -108.92 ml      03/14/2023    5:43 AM 03/13/2023    1:07 AM 03/12/2023    3:02 PM  Last 3 Weights  Weight (lbs) 178 lb 12.7 oz 179 lb 178 lb 12.8 oz  Weight (kg) 81.1 kg 81.194 kg 81.103 kg      Telemetry  Overnight telemetry shows A-fib heart rate 100 to 130 bpm, which I personally reviewed.    Physical Exam   Vitals:   03/14/23 0530 03/14/23 0531 03/14/23 0543 03/14/23 0719  BP: 119/80   122/77  Pulse: 98 100  62  Resp: 18   18  Temp:    97.7 F (36.5 C)  TempSrc:    Oral  SpO2: 91% 94%  97%  Weight:   81.1 kg   Height:        Intake/Output Summary (Last 24 hours) at 03/14/2023 1020 Last data filed at 03/14/2023 0900 Gross per 24 hour  Intake 341.08 ml  Output 450 ml  Net -108.92 ml       03/14/2023    5:43 AM  03/13/2023    1:07 AM 03/12/2023    3:02 PM  Last 3 Weights  Weight (lbs) 178 lb 12.7 oz 179 lb 178 lb 12.8 oz  Weight (kg) 81.1 kg 81.194 kg 81.103 kg    Body mass index is 28.86 kg/m.  General: Ill-appearing Head: Atraumatic, normal size  Eyes: PEERLA, EOMI  Neck: Supple, JVD 10-12 cmH2O Endocrine: No thryomegaly Cardiac: Normal S1, S2; irregular rhythm, no murmurs Lungs: Diminished breath sounds bilaterally Abd: Soft, nontender, no hepatomegaly  Ext: Trace edema Musculoskeletal: No deformities, BUE and BLE strength normal and equal Skin: Warm and dry, no rashes   Neuro: Alert and oriented to person, place, time, and situation, CNII-XII grossly intact, no focal deficits  Psych: Normal mood and affect   Labs  High Sensitivity Troponin:   Recent Labs  Lab 03/13/23 0109 03/13/23 0258  TROPONINIHS 39* 40*     Cardiac EnzymesNo results for input(s): "TROPONINI" in the last 168 hours. No results for input(s): "TROPIPOC" in the last 168 hours.  Chemistry Recent  Labs  Lab 03/13/23 0109 03/14/23 0557  NA 140 136  K 3.9 3.9  CL 106 104  CO2 19* 25  GLUCOSE 113* 93  BUN 21 33*  CREATININE 1.65* 2.15*  CALCIUM 8.9 8.3*  PROT 6.3* 5.7*  ALBUMIN 3.1* 2.9*  AST 121* 41  ALT 42 37  ALKPHOS 175* 145*  BILITOT 1.2 0.7  GFRNONAA 33* 24*  ANIONGAP 15 7    Hematology Recent Labs  Lab 03/13/23 0109 03/14/23 0557  WBC 6.6 3.9*  RBC 3.38* 2.98*  HGB 11.4* 10.2*  HCT 37.7 32.8*  MCV 111.5* 110.1*  MCH 33.7 34.2*  MCHC 30.2 31.1  RDW 16.0* 15.7*  PLT 255 174   BNP Recent Labs  Lab 03/13/23 0109  BNP 1,662.5*    DDimer No results for input(s): "DDIMER" in the last 168 hours.   Radiology  CT Angio Chest/Abd/Pel for Dissection W and/or Wo Contrast  Result Date: 03/13/2023 CLINICAL DATA:  Acute aortic syndrome, chest pain, epigastric abdominal pain EXAM: CT ANGIOGRAPHY CHEST, ABDOMEN AND PELVIS TECHNIQUE: Non-contrast CT of the chest was initially obtained.  Multidetector CT imaging through the chest, abdomen and pelvis was performed using the standard protocol during bolus administration of intravenous contrast. Multiplanar reconstructed images and MIPs were obtained and reviewed to evaluate the vascular anatomy. RADIATION DOSE REDUCTION: This exam was performed according to the departmental dose-optimization program which includes automated exposure control, adjustment of the mA and/or kV according to patient size and/or use of iterative reconstruction technique. CONTRAST:  80mL OMNIPAQUE IOHEXOL 350 MG/ML SOLN COMPARISON:  None Available. FINDINGS: CTA CHEST FINDINGS Cardiovascular: Extensive multi-vessel coronary artery calcification. Coronary artery bypass grafting has been performed. Global cardiac size within normal limits. No pericardial effusion. Central pulmonary arteries are enlarged in keeping with changes of pulmonary arterial hypertension. There is adequate opacification of the pulmonary arterial tree and no intraluminal filling defect is identified through the segmental level to suggest acute pulmonary embolism. The thoracic aorta is normal in caliber; no aneurysm, intramural hematoma or dissection is identified. Moderate mixed atherosclerotic plaque is seen within the thoracic aorta. Mediastinum/Nodes: No enlarged mediastinal, hilar, or axillary lymph nodes. Thyroid gland, trachea, and esophagus demonstrate no significant findings. Lungs/Pleura: There is mosaic attenuation within the pulmonary parenchyma in keeping with multifocal air trapping related to this probable small airways disease. There is bronchial wall thickening present in keeping with airway inflammation. No confluent pulmonary infiltrate. No pneumothorax or pleural effusion. No central obstructing lesion. Musculoskeletal: Remote left fifth rib fracture noted anteriorly. No acute bone abnormality. No lytic or blastic bone lesion. Review of the MIP images confirms the above findings. CTA  ABDOMEN AND PELVIS FINDINGS VASCULAR Aorta: Normal caliber. No aneurysm or dissection. Extensive atherosclerotic calcification with mild mural thrombus noted within the distal infrarenal abdominal aorta. No evidence of hemodynamically significant stenosis Celiac: Occluded at the origin with reconstitution via the gastroduodenal artery. Distal branches appear diminutive in caliber. Variant anatomy with early takeoff of the right hepatic artery. SMA: Moderate arteriosclerosis involving the mid and distal segments. No evidence of hemodynamically significant stenosis. No aneurysm or dissection. Renals: Single renal arteries are seen bilaterally demonstrating extensive arteriosclerosis. The renal arteries appear diminutive in caliber. Near occlusion noted involving the proximal right renal artery secondary to atherosclerotic calcification. The left renal artery appears diffusely diminutive in caliber without focal stenosis identified. Normal vascular morphology. IMA: High-grade stenosis at the origin. Distally diminutive but patent. Inflow: Extensive atherosclerotic calcification results in hemodynamically significant stenoses of the common iliac  artery origins bilaterally. The lower extremity arterial inflow vasculature is diffusely diseased and diminutive in caliber. Internal iliac arteries are heavily diseased and appear occluded at the origin on the left with relatively poor distal reconstitution and diminutive in caliber on the right. Veins: There is retrograde opacification of the hepatic venous system with mild dilation of the hepatocaval junction in keeping with changes of right heart failure. Review of the MIP images confirms the above findings. NON-VASCULAR Hepatobiliary: Subtly nodular contour of the liver suggests changes of cirrhosis. No enhancing intrahepatic mass. Cholecystectomy has been performed. No intra or extrahepatic biliary ductal dilation. Pancreas: Unremarkable Spleen: Unremarkable  Adrenals/Urinary Tract: The adrenal glands are unremarkable. The kidneys are normal in position. Bilateral renal cortical scarring noted. No hydronephrosis. No intrarenal urinary or ureteral calculi identified. The bladder is decompressed and is unremarkable. Stomach/Bowel: Stomach, small bowel, and large bowel are unremarkable. Appendix absent. No free intraperitoneal gas. Mild ascites. Lymphatic: No pathologic adenopathy within the abdomen and pelvis. Reproductive: Status post hysterectomy. No adnexal masses. Other: Tiny fat containing bilateral inguinal hernia. Musculoskeletal: Degenerative changes are seen within the lumbar spine. No acute bone abnormality. No lytic or blastic bone lesion. Review of the MIP images confirms the above findings. IMPRESSION: 1. No evidence of thoracoabdominal aortic aneurysm or dissection. 2. No pulmonary embolism identified. 3. Extensive multi-vessel coronary artery calcification, status post coronary artery bypass grafting. 4. Morphologic changes in keeping with pulmonary arterial hypertension and right heart failure with retrograde opacification of the hepatic venous system. 5. Mosaic attenuation within the pulmonary parenchyma and bronchial wall thickening in keeping with airway inflammation and multifocal air trapping. 6. 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 Clinical correlation for signs and symptoms of chronic mesenteric ischemia may be helpful. 7. Diffusely diminutive renal arteries bilaterally. Superimposed focal hemodynamically significant stenosis of the right renal artery origin. 8. Peripheral vascular disease with hemodynamically significant stenoses of the common iliac artery origins bilaterally and diffusely diseased lower extremity arterial inflow vasculature. 9. Morphologic changes suggesting cirrhosis. This could be confirmed with hepatic elastography or tissue sampling. Mild ascites. Aortic  Atherosclerosis (ICD10-I70.0). Electronically Signed   By: Helyn Numbers M.D.   On: 03/13/2023 03:37   DG Chest Portable 1 View  Result Date: 03/13/2023 CLINICAL DATA:  Chest pain. EXAM: PORTABLE CHEST 1 VIEW COMPARISON:  July 19, 2021 FINDINGS: Multiple sternal wires and vascular clips are noted. The cardiac silhouette is mildly enlarged. There is moderate severity calcification of the thoracic aorta. Low lung volumes are seen. Mild, diffuse, chronic appearing increased interstitial lung markings are seen. There is no evidence of acute infiltrate, pleural effusion or pneumothorax. The visualized skeletal structures are unremarkable. IMPRESSION: 1. Evidence of prior median sternotomy/CABG. 2. Low lung volumes without acute or active cardiopulmonary disease. Electronically Signed   By: Aram Candela M.D.   On: 03/13/2023 01:37    Cardiac Studies  TTE 03/04/2023  1. Left ventricular ejection fraction, by estimation, is 40 to 45%. The  left ventricle has mildly decreased function. The left ventricle  demonstrates global hypokinesis. Left ventricular diastolic parameters are  indeterminate.   2. Right ventricular systolic function is mildly reduced. The right  ventricular size is mildly enlarged. There is severely elevated pulmonary  artery systolic pressure. The estimated right ventricular systolic  pressure is 76.6 mmHg.   3. Left atrial size was mildly dilated.   4. Right atrial size was moderately dilated.   5. The mitral valve is grossly  normal. Moderate mitral valve  regurgitation.   6. Tricuspid valve regurgitation is moderate.   7. The aortic valve is tricuspid. There is mild calcification of the  aortic valve. There is mild thickening of the aortic valve. Aortic valve  regurgitation is not visualized. Aortic valve sclerosis/calcification is  present, without any evidence of  aortic stenosis.   8. Aortic dilatation noted. There is borderline dilatation of the  ascending aorta,  measuring 37 mm.   9. The inferior vena cava is dilated in size with >50% respiratory  variability, suggesting right atrial pressure of 8 mmHg.   Patient Profile  ANNALYA REINSEL is a 72 y.o. female with systolic heart failure, CAD status post CABG, pulmonary hypertension, persistent atrial fibrillation, hypertension, COPD, carotid artery disease, PAD, CKD admitted on 03/13/2023 for acute on chronic systolic heart failure and chest pain.  Assessment & Plan   # Acute systolic heart failure, EF 40-45% # Severe pulmonary hypertension -Appears volume overloaded on exam.  BNP is elevated.  Her creatinine is rising but I think this could be cardiorenal.  Start Lasix 60 mg IV twice daily. -Start hydralazine 25 mg 3 times daily for afterload reduction.  Add Imdur.  Avoid ACE/ARB/ARNI/MRA given AKI. -Will work to optimize her medical therapy. -I do agree she needs left heart catheterization however with rising serum creatinine left heart may be inadequate.  We will see how she does today.  If creatinine continues to rise may need a right heart catheterization to better determine hemodynamic status.  # Chest pain # CAD status post CABG # Elevated troponin, likely demand -Admitted with chest pain.  Troponins are minimally elevated and flat.  Volume overload.  Likely due to demand picture.  On heparin drip for A-fib.  Will continue this for now.  We will ultimately plan for left and right heart catheterization but this will depend on kidney function. -Continue aspirin.  Transition to Lipitor 40 mg daily.  # Persistent A-fib -Was on Multaq at home.  Agree with holding this.  Rates are very poorly controlled.  BP stable.  Start metoprolol to tartrate 25 mg twice daily.  Can titrate up further for better rate control. -Hold Eliquis.  On heparin drip as she will need a left and right heart catheterization at some point.  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  No stroke like symptoms reported. -She reports no pain with eating.  Has significant mesenteric artery disease.  We will order ultrasounds on this. -Also with significant PAD.  Seems to be unrecognized.  Ultrasounds as well. -Transition to high intensity statin.  Lipitor 40. -Reports no abdominal symptoms but I would like to get a lactic acid to make sure we do not get chronic mesenteric ischemia.  # Cirrhosis -Seen on recent CT imaging.      For questions or updates, please contact [facility] Please consult [email or web address] for contact info under        Signed, Gerri Spore T. Flora Lipps, MD, [facility]  [facility]  03/14/2023 10:20 AM

## 2023-03-14 NOTE — Progress Notes (Signed)
ANTICOAGULATION CONSULT NOTE  Pharmacy Consult for Eliquis >> IV heparin Indication: atrial fibrillation  No Known Allergies  Patient Measurements: Height: 5\' 6"  (167.6 cm) Weight: 81.1 kg (178 lb 12.7 oz) IBW/kg (Calculated) : 59.3 Heparin Dosing Weight: 76.2 kg  Vital Signs: Temp: 97.7 F (36.5 C) (06/29 0719) Temp Source: Oral (06/29 0719) BP: 122/77 (06/29 0719) Pulse Rate: 77 (06/29 1400)  Labs: Recent Labs    03/13/23 0109 03/13/23 0258 03/13/23 1758 03/14/23 0557 03/14/23 1532  HGB 11.4*  --   --  10.2*  --   HCT 37.7  --   --  32.8*  --   PLT 255  --   --  174  --   APTT  --   --  38* 64* 64*  HEPARINUNFRC  --   --  >1.10* >1.10*  --   CREATININE 1.65*  --   --  2.15*  --   TROPONINIHS 39* 40*  --   --   --      Estimated Creatinine Clearance: 25.8 mL/min (A) (by C-G formula based on SCr of 2.15 mg/dL (H)).   Medical History: Past Medical History:  Diagnosis Date   A-fib (HCC)    Carotid artery disease (HCC)    COPD (chronic obstructive pulmonary disease) (HCC)    Coronary artery disease    Former tobacco use    Heart failure with mildly reduced ejection fraction (HFmrEF) (HCC)    Hx of CABG    Hypertension    PUD (peptic ulcer disease)     Medications:  Scheduled:   aspirin EC  81 mg Oral Daily   atorvastatin  40 mg Oral Daily   furosemide  60 mg Intravenous BID   hydrALAZINE  25 mg Oral Q8H   metoprolol tartrate  25 mg Oral BID   sertraline  100 mg Oral Daily    Assessment: 72 yo female presented with chest pain, epigastric pain, and emesis. Patient is on Eliquis 5mg  BID PTA for hx Afib. Eliquis was resumed on admission and last dose was 6/28 @ 0900. Pharmacy has been consulted to transition Eliquis to IV heparin with plans for cardiac cath.  Baseline aPTT 38 and heparin level >1.1 as expected. Will monitor with aPTT until levels correlating.  aPTT remained the same at 64 (subtherapeutic) despite rate increase. No signs of bleeding  reported.  Goal of Therapy:  Heparin level 0.3-0.7 units/ml aPTT 66-102 seconds Monitor platelets by anticoagulation protocol: Yes   Plan:  Increase heparin to 1250 units/hr Check aPTT in 8 hours Daily HL, aPTT, CBC F/u cath plans  Eldridge Scot, PharmD Clinical Pharmacist 03/14/2023 4:12 PM

## 2023-03-14 NOTE — Progress Notes (Signed)
PROGRESS NOTE    Monica Zamora  WUJ:[id number] DOB: [birth year] DOA: 03/13/2023 PCP: Rebecka Apley, NP  Outpatient Specialists:     Brief Narrative:  Patient is a 72 year old female with history of coronary artery disease status post CABG, hypertension and atrial fibrillation. Patient was admitted with lower sternal/epigastric chest pain radiating to the neck. Associated nausea and vomiting. Flat troponin. EKG reveals A-fib with nonspecific T wave changes. AKI on CKD 3 is noted.   CTA chest/abdomen/pelvis revealed: 1. No evidence of thoracoabdominal aortic aneurysm or dissection. 2. No pulmonary embolism identified. 3. Extensive multi-vessel coronary artery calcification, status post coronary artery bypass grafting. 4. Morphologic changes in keeping with pulmonary arterial hypertension and right heart failure with retrograde opacification of the hepatic venous system. 5. Mosaic attenuation within the pulmonary parenchyma and bronchial wall thickening in keeping with airway inflammation and multifocal air trapping. 6. 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 Clinical correlation for signs and symptoms of chronic mesenteric ischemia may be helpful. 7. Diffusely diminutive renal arteries bilaterally. Superimposed focal hemodynamically significant stenosis of the right renal artery origin. 8. Peripheral vascular disease with hemodynamically significant stenoses of the common iliac artery origins bilaterally and diffusely diseased lower extremity arterial inflow vasculature. 9. Morphologic changes suggesting cirrhosis. This could be confirmed with hepatic elastography or tissue sampling. Mild ascites.   Aortic Atherosclerosis   03/14/2023: Patient seen.  No chest pain reported.  Cardiology input is highly appreciated.  For likely cardiac catheterization on Monday (2 days time), query right and left heart  catheterization depending on renal function.  Patient is also on IV Lasix as there are concerns for possible cardiorenal component to the acute kidney injury.   Assessment & Plan:   Principal Problem:   Acute on chronic systolic (congestive) heart failure (HCC)  Acute on chronic HFrEF 40-45% -Last 2D echo done on 03/04/2023 revealed LVEF 4045%, left ventricle global hypokinesis -Patient presented with volume overload on exam, right JVD, bilateral lower extremity pitting edema, BNP greater than 1600 -IV Lasix.   -Start strict I's and O's and daily weight -Cardiology team is directing admission.  Chest pain:  Elevated troponin: Troponin flat, 39, repeat 40 Cardiac catheterization is planned in 2 days time if renal function permits.   AKI on CKD 3A: -Baseline creatinine appears to be 1.1 with GFR 51 -Presented with creatinine of 1.64 with GFR of 33 -AKI is likely multifactorial, including, but not limited to ATN/cardiorenal syndrome/exposure to contrast. -Aggressive diuresis. -Avoid hypotension. -Avoid nephrotoxic medications. -Keep MAP greater than 65 mmHg. -Strict I's and O's. -Dose all medications assuming GFR of less than 50 mL/min. -Serum creatinine is 2.15 today.  Elevated liver chemistries, unclear etiology, suspect related to alcohol use AST to ALT ratio 2:1 Monitor for now Avoid hepatotoxic agents   Permanent atrial fibrillation, currently rate controlled, on Eliquis Initially presented hypotensive and bradycardic, home beta-blocker was reversed with glucagon Rate control agent has been held Resume home Eliquis Closely monitor on telemetry Consult cardiology in the morning   Chronic anxiety/depression Resume home Zoloft   Chronic hypoxic respiratory failure on 2 L nasal cannula continuously Appears to be at her baseline oxygen requirement Maintain O2 saturation above 90%       DVT prophylaxis: Heparin drip. Code Status: Full code. Family Communication:   Disposition Plan: This will depend on hospital course.   Consultants:  Cardiology.  Procedures:  Cardiac catheterization is planned.  Antimicrobials:  None.   Subjective:  Chest pain.  Objective: Vitals:   03/14/23 0530 03/14/23 0531 03/14/23 0543 03/14/23 0719  BP: 119/80   122/77  Pulse: 98 100  62  Resp: 18   18  Temp:    97.7 F (36.5 C)  TempSrc:    Oral  SpO2: 91% 94%  97%  Weight:   81.1 kg   Height:        Intake/Output Summary (Last 24 hours) at 03/14/2023 1417 Last data filed at 03/14/2023 0900 Gross per 24 hour  Intake 341.08 ml  Output 450 ml  Net -108.92 ml   Filed Weights   03/13/23 0107 03/14/23 0543  Weight: 81.2 kg 81.1 kg    Examination:  General exam: Appears calm and comfortable  Respiratory system: Decreased air entry globally.   Cardiovascular system: S1 & S2 heard Gastrointestinal system: Abdomen is obese, soft and nontender.  Central nervous system: Awake and alert.  Patient moves all extremities.   Extremities: Bilateral lower extremity edema.  Data Reviewed: I have personally reviewed following labs and imaging studies  CBC: Recent Labs  Lab 03/13/23 0109 03/14/23 0557  WBC 6.6 3.9*  NEUTROABS 4.7 2.5  HGB 11.4* 10.2*  HCT 37.7 32.8*  MCV 111.5* 110.1*  PLT 255 174   Basic Metabolic Panel: Recent Labs  Lab 03/13/23 0109 03/14/23 0557  NA 140 136  K 3.9 3.9  CL 106 104  CO2 19* 25  GLUCOSE 113* 93  BUN 21 33*  CREATININE 1.65* 2.15*  CALCIUM 8.9 8.3*  MG  --  2.0  PHOS  --  4.6   GFR: Estimated Creatinine Clearance: 25.8 mL/min (A) (by C-G formula based on SCr of 2.15 mg/dL (H)). Liver Function Tests: Recent Labs  Lab 03/13/23 0109 03/14/23 0557  AST 121* 41  ALT 42 37  ALKPHOS 175* 145*  BILITOT 1.2 0.7  PROT 6.3* 5.7*  ALBUMIN 3.1* 2.9*   No results for input(s): "LIPASE", "AMYLASE" in the last 168 hours. No results for input(s): "AMMONIA" in the last 168 hours. Coagulation Profile: No results  for input(s): "INR", "PROTIME" in the last 168 hours. Cardiac Enzymes: No results for input(s): "CKTOTAL", "CKMB", "CKMBINDEX", "TROPONINI" in the last 168 hours. BNP (last 3 results) No results for input(s): "PROBNP" in the last 8760 hours. HbA1C: No results for input(s): "HGBA1C" in the last 72 hours. CBG: No results for input(s): "GLUCAP" in the last 168 hours. Lipid Profile: Recent Labs    03/14/23 0557  CHOL 135  HDL 36*  LDLCALC 82  TRIG 86  CHOLHDL 3.8   Thyroid Function Tests: No results for input(s): "TSH", "T4TOTAL", "FREET4", "T3FREE", "THYROIDAB" in the last 72 hours. Anemia Panel: No results for input(s): "VITAMINB12", "FOLATE", "FERRITIN", "TIBC", "IRON", "RETICCTPCT" in the last 72 hours. Urine analysis: No results found for: "COLORURINE", "APPEARANCEUR", "LABSPEC", "PHURINE", "GLUCOSEU", "HGBUR", "BILIRUBINUR", "KETONESUR", "PROTEINUR", "UROBILINOGEN", "NITRITE", "LEUKOCYTESUR" Sepsis Labs: @LABRCNTIP (procalcitonin:4,lacticidven:4)  )No results found for this or any previous visit (from the past 240 hour(s)).       Radiology Studies: CT Angio Chest/Abd/Pel for Dissection W and/or Wo Contrast  Result Date: 03/13/2023 CLINICAL DATA:  Acute aortic syndrome, chest pain, epigastric abdominal pain EXAM: CT ANGIOGRAPHY CHEST, ABDOMEN AND PELVIS TECHNIQUE: Non-contrast CT of the chest was initially obtained. Multidetector CT imaging through the chest, abdomen and pelvis was performed using the standard protocol during bolus administration of intravenous contrast. Multiplanar reconstructed images and MIPs were obtained and reviewed to evaluate the vascular anatomy. RADIATION DOSE REDUCTION: This exam was performed  according to the departmental dose-optimization program which includes automated exposure control, adjustment of the mA and/or kV according to patient size and/or use of iterative reconstruction technique. CONTRAST:  80mL OMNIPAQUE IOHEXOL 350 MG/ML SOLN  COMPARISON:  None Available. FINDINGS: CTA CHEST FINDINGS Cardiovascular: Extensive multi-vessel coronary artery calcification. Coronary artery bypass grafting has been performed. Global cardiac size within normal limits. No pericardial effusion. Central pulmonary arteries are enlarged in keeping with changes of pulmonary arterial hypertension. There is adequate opacification of the pulmonary arterial tree and no intraluminal filling defect is identified through the segmental level to suggest acute pulmonary embolism. The thoracic aorta is normal in caliber; no aneurysm, intramural hematoma or dissection is identified. Moderate mixed atherosclerotic plaque is seen within the thoracic aorta. Mediastinum/Nodes: No enlarged mediastinal, hilar, or axillary lymph nodes. Thyroid gland, trachea, and esophagus demonstrate no significant findings. Lungs/Pleura: There is mosaic attenuation within the pulmonary parenchyma in keeping with multifocal air trapping related to this probable small airways disease. There is bronchial wall thickening present in keeping with airway inflammation. No confluent pulmonary infiltrate. No pneumothorax or pleural effusion. No central obstructing lesion. Musculoskeletal: Remote left fifth rib fracture noted anteriorly. No acute bone abnormality. No lytic or blastic bone lesion. Review of the MIP images confirms the above findings. CTA ABDOMEN AND PELVIS FINDINGS VASCULAR Aorta: Normal caliber. No aneurysm or dissection. Extensive atherosclerotic calcification with mild mural thrombus noted within the distal infrarenal abdominal aorta. No evidence of hemodynamically significant stenosis Celiac: Occluded at the origin with reconstitution via the gastroduodenal artery. Distal branches appear diminutive in caliber. Variant anatomy with early takeoff of the right hepatic artery. SMA: Moderate arteriosclerosis involving the mid and distal segments. No evidence of hemodynamically significant  stenosis. No aneurysm or dissection. Renals: Single renal arteries are seen bilaterally demonstrating extensive arteriosclerosis. The renal arteries appear diminutive in caliber. Near occlusion noted involving the proximal right renal artery secondary to atherosclerotic calcification. The left renal artery appears diffusely diminutive in caliber without focal stenosis identified. Normal vascular morphology. IMA: High-grade stenosis at the origin. Distally diminutive but patent. Inflow: Extensive atherosclerotic calcification results in hemodynamically significant stenoses of the common iliac artery origins bilaterally. The lower extremity arterial inflow vasculature is diffusely diseased and diminutive in caliber. Internal iliac arteries are heavily diseased and appear occluded at the origin on the left with relatively poor distal reconstitution and diminutive in caliber on the right. Veins: There is retrograde opacification of the hepatic venous system with mild dilation of the hepatocaval junction in keeping with changes of right heart failure. Review of the MIP images confirms the above findings. NON-VASCULAR Hepatobiliary: Subtly nodular contour of the liver suggests changes of cirrhosis. No enhancing intrahepatic mass. Cholecystectomy has been performed. No intra or extrahepatic biliary ductal dilation. Pancreas: Unremarkable Spleen: Unremarkable Adrenals/Urinary Tract: The adrenal glands are unremarkable. The kidneys are normal in position. Bilateral renal cortical scarring noted. No hydronephrosis. No intrarenal urinary or ureteral calculi identified. The bladder is decompressed and is unremarkable. Stomach/Bowel: Stomach, small bowel, and large bowel are unremarkable. Appendix absent. No free intraperitoneal gas. Mild ascites. Lymphatic: No pathologic adenopathy within the abdomen and pelvis. Reproductive: Status post hysterectomy. No adnexal masses. Other: Tiny fat containing bilateral inguinal hernia.  Musculoskeletal: Degenerative changes are seen within the lumbar spine. No acute bone abnormality. No lytic or blastic bone lesion. Review of the MIP images confirms the above findings. IMPRESSION: 1. No evidence of thoracoabdominal aortic aneurysm or dissection. 2. No pulmonary embolism identified. 3. Extensive multi-vessel coronary artery  calcification, status post coronary artery bypass grafting. 4. Morphologic changes in keeping with pulmonary arterial hypertension and right heart failure with retrograde opacification of the hepatic venous system. 5. Mosaic attenuation within the pulmonary parenchyma and bronchial wall thickening in keeping with airway inflammation and multifocal air trapping. 6. 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 Clinical correlation for signs and symptoms of chronic mesenteric ischemia may be helpful. 7. Diffusely diminutive renal arteries bilaterally. Superimposed focal hemodynamically significant stenosis of the right renal artery origin. 8. Peripheral vascular disease with hemodynamically significant stenoses of the common iliac artery origins bilaterally and diffusely diseased lower extremity arterial inflow vasculature. 9. Morphologic changes suggesting cirrhosis. This could be confirmed with hepatic elastography or tissue sampling. Mild ascites. Aortic Atherosclerosis (ICD10-I70.0). Electronically Signed   By: Helyn Numbers M.D.   On: 03/13/2023 03:37   DG Chest Portable 1 View  Result Date: 03/13/2023 CLINICAL DATA:  Chest pain. EXAM: PORTABLE CHEST 1 VIEW COMPARISON:  July 19, 2021 FINDINGS: Multiple sternal wires and vascular clips are noted. The cardiac silhouette is mildly enlarged. There is moderate severity calcification of the thoracic aorta. Low lung volumes are seen. Mild, diffuse, chronic appearing increased interstitial lung markings are seen. There is no evidence of acute infiltrate, pleural effusion or  pneumothorax. The visualized skeletal structures are unremarkable. IMPRESSION: 1. Evidence of prior median sternotomy/CABG. 2. Low lung volumes without acute or active cardiopulmonary disease. Electronically Signed   By: Aram Candela M.D.   On: 03/13/2023 01:37        Scheduled Meds:  aspirin EC  81 mg Oral Daily   atorvastatin  40 mg Oral Daily   furosemide  60 mg Intravenous BID   hydrALAZINE  25 mg Oral Q8H   metoprolol tartrate  25 mg Oral BID   sertraline  100 mg Oral Daily   Continuous Infusions:  heparin 1,150 Units/hr (03/14/23 0832)   sodium chloride Stopped (03/13/23 0305)     LOS: 1 day    Time spent: 35 minutes.    Berton Mount, MD  [facility] Pager #: [phone] 7PM-7AM contact night coverage as above

## 2023-03-15 DIAGNOSIS — I5023 Acute on chronic systolic (congestive) heart failure: Secondary | ICD-10-CM | POA: Diagnosis not present

## 2023-03-15 LAB — RENAL FUNCTION PANEL
Albumin: 3 g/dL — ABNORMAL LOW (ref 3.5–5.0)
Anion gap: 11 (ref 5–15)
BUN: 33 mg/dL — ABNORMAL HIGH (ref 8–23)
CO2: 25 mmol/L (ref 22–32)
Calcium: 8.3 mg/dL — ABNORMAL LOW (ref 8.9–10.3)
Chloride: 101 mmol/L (ref 98–111)
Creatinine, Ser: 1.7 mg/dL — ABNORMAL HIGH (ref 0.44–1.00)
GFR, Estimated: 32 mL/min — ABNORMAL LOW (ref >60)
Glucose, Bld: 105 mg/dL — ABNORMAL HIGH (ref 70–99)
Phosphorus: 3.7 mg/dL (ref 2.5–4.6)
Potassium: 3.4 mmol/L — ABNORMAL LOW (ref 3.5–5.1)
Sodium: 137 mmol/L (ref 135–145)

## 2023-03-15 LAB — CBC WITH DIFFERENTIAL/PLATELET
Abs Immature Granulocytes: 0.02 10*3/uL (ref 0.00–0.07)
Basophils Absolute: 0.1 10*3/uL (ref 0.0–0.1)
Basophils Relative: 1 %
Eosinophils Absolute: 0.1 10*3/uL (ref 0.0–0.5)
Eosinophils Relative: 3 %
HCT: 31.6 % — ABNORMAL LOW (ref 36.0–46.0)
Hemoglobin: 9.9 g/dL — ABNORMAL LOW (ref 12.0–15.0)
Immature Granulocytes: 1 %
Lymphocytes Relative: 18 %
Lymphs Abs: 0.8 10*3/uL (ref 0.7–4.0)
MCH: 33.2 pg (ref 26.0–34.0)
MCHC: 31.3 g/dL (ref 30.0–36.0)
MCV: 106 fL — ABNORMAL HIGH (ref 80.0–100.0)
Monocytes Absolute: 0.3 10*3/uL (ref 0.1–1.0)
Monocytes Relative: 7 %
Neutro Abs: 3 10*3/uL (ref 1.7–7.7)
Neutrophils Relative %: 70 %
Platelets: 197 10*3/uL (ref 150–400)
RBC: 2.98 MIL/uL — ABNORMAL LOW (ref 3.87–5.11)
RDW: 15.4 % (ref 11.5–15.5)
WBC: 4.2 10*3/uL (ref 4.0–10.5)
nRBC: 0 % (ref 0.0–0.2)

## 2023-03-15 LAB — HEPARIN LEVEL (UNFRACTIONATED)
Heparin Unfractionated: >1.1 IU/mL — ABNORMAL HIGH (ref 0.30–0.70)
Heparin Unfractionated: >1.1 IU/mL — ABNORMAL HIGH (ref 0.30–0.70)

## 2023-03-15 LAB — APTT
aPTT: 84 seconds — ABNORMAL HIGH (ref 24–36)
aPTT: 84 seconds — ABNORMAL HIGH (ref 24–36)

## 2023-03-15 LAB — MAGNESIUM: Magnesium: 1.8 mg/dL (ref 1.7–2.4)

## 2023-03-15 MED ORDER — SODIUM CHLORIDE 0.9 % IV SOLN: 250.0000 mL | INTRAVENOUS | Status: DC | PRN

## 2023-03-15 MED ORDER — SODIUM CHLORIDE 0.9% FLUSH
3.0000 mL | Freq: Two times a day (BID) | INTRAVENOUS | Status: DC
  Administered 2023-03-16 – 2023-03-24 (×10): 3 mL via INTRAVENOUS

## 2023-03-15 MED ORDER — ASPIRIN 81 MG PO CHEW
81.0000 mg | CHEWABLE_TABLET | ORAL | Status: AC
  Administered 2023-03-16: 81 mg via ORAL
  Filled 2023-03-15: qty 1

## 2023-03-15 MED ORDER — SODIUM CHLORIDE 0.9 % IV SOLN: INTRAVENOUS | Status: DC

## 2023-03-15 MED ORDER — ISOSORBIDE MONONITRATE ER 30 MG PO TB24
30.0000 mg | ORAL_TABLET | Freq: Every day | ORAL | Status: DC
  Administered 2023-03-15 – 2023-03-19 (×5): 30 mg via ORAL
  Filled 2023-03-15 (×5): qty 1

## 2023-03-15 MED ORDER — SPIRONOLACTONE 12.5 MG HALF TABLET
12.5000 mg | ORAL_TABLET | Freq: Every day | ORAL | Status: DC
  Administered 2023-03-15: 12.5 mg via ORAL
  Filled 2023-03-15: qty 1

## 2023-03-15 MED ORDER — SODIUM CHLORIDE 0.9% FLUSH: 3.0000 mL | INTRAVENOUS | Status: DC | PRN

## 2023-03-15 MED ORDER — POTASSIUM CHLORIDE CRYS ER 20 MEQ PO TBCR
40.0000 meq | EXTENDED_RELEASE_TABLET | Freq: Once | ORAL | Status: AC
  Administered 2023-03-15: 40 meq via ORAL
  Filled 2023-03-15: qty 2

## 2023-03-15 NOTE — Progress Notes (Signed)
PROGRESS NOTE    Monica Zamora  UJW:[id number] DOB: [birth year] DOA: 03/13/2023 PCP: Rebecka Apley, NP  Outpatient Specialists:     Brief Narrative:  Patient is a 72 year old female with history of coronary artery disease status post CABG, hypertension and atrial fibrillation. Patient was admitted with lower sternal/epigastric chest pain radiating to the neck. Associated nausea and vomiting. Flat troponin. EKG revealed A-fib with nonspecific T wave changes. AKI on CKD 3 is noted.   CTA chest/abdomen/pelvis revealed: 1. No evidence of thoracoabdominal aortic aneurysm or dissection. 2. No pulmonary embolism identified. 3. Extensive multi-vessel coronary artery calcification, status post coronary artery bypass grafting. 4. Morphologic changes in keeping with pulmonary arterial hypertension and right heart failure with retrograde opacification of the hepatic venous system. 5. Mosaic attenuation within the pulmonary parenchyma and bronchial wall thickening in keeping with airway inflammation and multifocal air trapping. 6. 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 Clinical correlation for signs and symptoms of chronic mesenteric ischemia may be helpful. 7. Diffusely diminutive renal arteries bilaterally. Superimposed focal hemodynamically significant stenosis of the right renal artery origin. 8. Peripheral vascular disease with hemodynamically significant stenoses of the common iliac artery origins bilaterally and diffusely diseased lower extremity arterial inflow vasculature. 9. Morphologic changes suggesting cirrhosis. This could be confirmed with hepatic elastography or tissue sampling. Mild ascites.   Aortic Atherosclerosis   03/14/2023: Patient seen.  No chest pain reported.  Cardiology input is highly appreciated.  For likely cardiac catheterization on Monday (2 days time), query right and left heart  catheterization depending on renal function.  Patient is also on IV Lasix as there are concerns for possible cardiorenal component to the acute kidney injury.  03/15/2023: Patient seen.  No new complaints.  Improvement in renal function is noted.  Serum creatinine has improved from 2.15-1.7.  Potassium of 3.4 noted., will replete.   Assessment & Plan:   Principal Problem:   Acute on chronic systolic (congestive) heart failure (HCC)  Acute on chronic HFrEF 40-45% -Last 2D echo done on 03/04/2023 revealed LVEF 40-45%, left ventricle global hypokinesis -Patient presented with volume overload on exam, right JVD, bilateral lower extremity pitting edema, BNP greater than 1600 -IV Lasix.   -Start strict I's and O's and daily weight -Cardiology team is directing admission.  Chest pain:  Elevated troponin: Troponin flat, 39, repeat 40 For possible left and right heart catheterization tomorrow. Continue to monitor renal function.     AKI on CKD 3A: -Baseline creatinine appears to be 1.1 with GFR 51 -Presented with creatinine of 1.64 with GFR of 33 -AKI is likely multifactorial, including, but not limited to ATN/cardiorenal syndrome/exposure to contrast. -Aggressive diuresis. -Avoid hypotension. -Avoid nephrotoxic medications. -Keep MAP greater than 65 mmHg. -Strict I's and O's. -Dose all medications assuming GFR of less than 50 mL/min. -Serum creatinine improved from 2.15-1.7 today.  Elevated liver chemistries, unclear etiology, suspect related to alcohol use -Initially elevated AST, but back to normal. -Continue to monitor for now -Avoid hepatotoxic agents   Permanent atrial fibrillation, currently rate controlled, on Eliquis Initially presented hypotensive and bradycardic, home beta-blocker was reversed with glucagon Rate control agent has been held Resume home Eliquis Closely monitor on telemetry Consult cardiology in the morning   Chronic anxiety/depression Resume home  Zoloft   Chronic hypoxic respiratory failure on 2 L nasal cannula continuously Appears to be at her baseline oxygen requirement Maintain O2 saturation above 90%       DVT  prophylaxis: Heparin drip. Code Status: Full code. Family Communication:  Disposition Plan: This will depend on hospital course.   Consultants:  Cardiology.  Procedures:  Cardiac catheterization is planned.  Antimicrobials:  None.   Subjective: No chest pain.  Objective: Vitals:   03/14/23 2326 03/15/23 0340 03/15/23 0717 03/15/23 1100  BP: 123/60 112/70 (!) 104/91 109/79  Pulse: 97 73 88 85  Resp: 20 20 18 18   Temp: 97.8 F (36.6 C) (!) 97.4 F (36.3 C) (!) 97.5 F (36.4 C) (!) 97.5 F (36.4 C)  TempSrc: Oral Oral Oral Oral  SpO2: 93% 93% 98% 97%  Weight:  82.1 kg    Height:        Intake/Output Summary (Last 24 hours) at 03/15/2023 1321 Last data filed at 03/15/2023 1030 Gross per 24 hour  Intake 406.95 ml  Output 950 ml  Net -543.05 ml    Filed Weights   03/13/23 0107 03/14/23 0543 03/15/23 0340  Weight: 81.2 kg 81.1 kg 82.1 kg    Examination:  General exam: Appears calm and comfortable  Respiratory system: Decreased air entry globally.   Cardiovascular system: S1 & S2 heard Gastrointestinal system: Abdomen is obese, soft and nontender.  Central nervous system: Awake and alert.  Patient moves all extremities.   Extremities: Bilateral lower extremity edema.  Data Reviewed: I have personally reviewed following labs and imaging studies  CBC: Recent Labs  Lab 03/13/23 0109 03/14/23 0557 03/15/23 0128  WBC 6.6 3.9* 4.2  NEUTROABS 4.7 2.5 3.0  HGB 11.4* 10.2* 9.9*  HCT 37.7 32.8* 31.6*  MCV 111.5* 110.1* 106.0*  PLT 255 174 197    Basic Metabolic Panel: Recent Labs  Lab 03/13/23 0109 03/14/23 0557 03/15/23 0128  NA 140 136 137  K 3.9 3.9 3.4*  CL 106 104 101  CO2 19* 25 25  GLUCOSE 113* 93 105*  BUN 21 33* 33*  CREATININE 1.65* 2.15* 1.70*  CALCIUM 8.9 8.3*  8.3*  MG  --  2.0 1.8  PHOS  --  4.6 3.7    GFR: Estimated Creatinine Clearance: 32.8 mL/min (A) (by C-G formula based on SCr of 1.7 mg/dL (H)). Liver Function Tests: Recent Labs  Lab 03/13/23 0109 03/14/23 0557 03/15/23 0128  AST 121* 41  --   ALT 42 37  --   ALKPHOS 175* 145*  --   BILITOT 1.2 0.7  --   PROT 6.3* 5.7*  --   ALBUMIN 3.1* 2.9* 3.0*    No results for input(s): "LIPASE", "AMYLASE" in the last 168 hours. No results for input(s): "AMMONIA" in the last 168 hours. Coagulation Profile: No results for input(s): "INR", "PROTIME" in the last 168 hours. Cardiac Enzymes: No results for input(s): "CKTOTAL", "CKMB", "CKMBINDEX", "TROPONINI" in the last 168 hours. BNP (last 3 results) No results for input(s): "PROBNP" in the last 8760 hours. HbA1C: No results for input(s): "HGBA1C" in the last 72 hours. CBG: No results for input(s): "GLUCAP" in the last 168 hours. Lipid Profile: Recent Labs    03/14/23 0557  CHOL 135  HDL 36*  LDLCALC 82  TRIG 86  CHOLHDL 3.8    Thyroid Function Tests: No results for input(s): "TSH", "T4TOTAL", "FREET4", "T3FREE", "THYROIDAB" in the last 72 hours. Anemia Panel: No results for input(s): "VITAMINB12", "FOLATE", "FERRITIN", "TIBC", "IRON", "RETICCTPCT" in the last 72 hours. Urine analysis:    Component Value Date/Time   COLORURINE STRAW (A) 03/14/2023 1640   APPEARANCEUR CLEAR 03/14/2023 1640   LABSPEC 1.008 03/14/2023 1640  PHURINE 5.0 03/14/2023 1640   GLUCOSEU NEGATIVE 03/14/2023 1640   HGBUR NEGATIVE 03/14/2023 1640   BILIRUBINUR NEGATIVE 03/14/2023 1640   KETONESUR NEGATIVE 03/14/2023 1640   PROTEINUR NEGATIVE 03/14/2023 1640   NITRITE NEGATIVE 03/14/2023 1640   LEUKOCYTESUR NEGATIVE 03/14/2023 1640   Sepsis Labs: @LABRCNTIP (procalcitonin:4,lacticidven:4)  )No results found for this or any previous visit (from the past 240 hour(s)).       Radiology Studies: No results found.      Scheduled Meds:   aspirin EC  81 mg Oral Daily   atorvastatin  40 mg Oral Daily   furosemide  60 mg Intravenous BID   hydrALAZINE  25 mg Oral Q8H   isosorbide mononitrate  30 mg Oral Daily   metoprolol tartrate  25 mg Oral BID   sertraline  100 mg Oral Daily   sodium chloride flush  3 mL Intravenous Q12H   spironolactone  12.5 mg Oral Daily   Continuous Infusions:  heparin 1,250 Units/hr (03/14/23 1620)   sodium chloride Stopped (03/13/23 0305)     LOS: 2 days    Time spent: 35 minutes.    Berton Mount, MD  [facility] Pager #: [phone] 7PM-7AM contact night coverage as above

## 2023-03-15 NOTE — Progress Notes (Signed)
Mobility Specialist Progress Note:   03/15/23 1139  Mobility  Activity Ambulated with assistance in hallway  Level of Assistance Contact guard assist, steadying assist  Assistive Device Front wheel walker  Distance Ambulated (ft) 156 ft  Activity Response Tolerated well  Mobility Referral Yes  $Mobility charge 1 Mobility  Mobility Specialist Start Time (ACUTE ONLY) 1040  Mobility Specialist Stop Time (ACUTE ONLY) 1110  Mobility Specialist Time Calculation (min) (ACUTE ONLY) 30 min   Pt received in bed, agreeable to ambulate. Pt needed no physical assistance w/ bed mobilityand a contact guard throughout session. Prior to ambulation pt requested to  use BSC, voided successfully. Pt ambulated on 6L/min, VSS. No c/o throughout session. Pt assisted to chair with call bell and personal belongings at hand.    Pre Mobility HR 83 SPO2 92% (5L) During Mobility HR 112 SPO2 91% (6L) Post Mobility HR 92 SPO2 92%   Thompson Grayer Mobility Specialist  Please contact vis Secure Chat or  Rehab Office [phone]

## 2023-03-15 NOTE — Progress Notes (Signed)
ANTICOAGULATION CONSULT NOTE  Pharmacy Consult for Eliquis >> IV heparin Indication: atrial fibrillation  No Known Allergies  Patient Measurements: Height: 5\' 6"  (167.6 cm) Weight: 82.1 kg (181 lb) IBW/kg (Calculated) : 59.3 Heparin Dosing Weight: 76.2 kg  Vital Signs: Temp: 97.5 F (36.4 C) (06/30 1100) Temp Source: Oral (06/30 1100) BP: 109/79 (06/30 1100) Pulse Rate: 85 (06/30 1100)  Labs: Recent Labs    03/13/23 0109 03/13/23 0258 03/13/23 1758 03/14/23 0557 03/14/23 1532 03/15/23 0128 03/15/23 1109  HGB 11.4*  --   --  10.2*  --  9.9*  --   HCT 37.7  --   --  32.8*  --  31.6*  --   PLT 255  --   --  174  --  197  --   APTT  --   --    < > 64* 64* 84* 84*  HEPARINUNFRC  --   --    < > >1.10*  --  >1.10* >1.10*  CREATININE 1.65*  --   --  2.15*  --  1.70*  --   TROPONINIHS 39* 40*  --   --   --   --   --    < > = values in this interval not displayed.     Estimated Creatinine Clearance: 32.8 mL/min (A) (by C-G formula based on SCr of 1.7 mg/dL (H)).   Medical History: Past Medical History:  Diagnosis Date   A-fib (HCC)    Carotid artery disease (HCC)    COPD (chronic obstructive pulmonary disease) (HCC)    Coronary artery disease    Former tobacco use    Heart failure with mildly reduced ejection fraction (HFmrEF) (HCC)    Hx of CABG    Hypertension    PUD (peptic ulcer disease)     Medications:  Scheduled:   aspirin EC  81 mg Oral Daily   atorvastatin  40 mg Oral Daily   furosemide  60 mg Intravenous BID   hydrALAZINE  25 mg Oral Q8H   isosorbide mononitrate  30 mg Oral Daily   metoprolol tartrate  25 mg Oral BID   sertraline  100 mg Oral Daily   sodium chloride flush  3 mL Intravenous Q12H   spironolactone  12.5 mg Oral Daily    Assessment: 72 yo female presented with chest pain, epigastric pain, and emesis. Patient is on Eliquis 5mg  BID PTA for hx Afib. Eliquis was resumed on admission and last dose was 6/28 @ 0900. Pharmacy has been  consulted to transition Eliquis to IV heparin with plans for cardiac cath.  Baseline aPTT 38 and heparin level >1.1 as expected. Will monitor with aPTT until levels correlating.  aPTT remained the same at 64 (subtherapeutic) despite rate increase. No signs of bleeding reported.  Confirm level came back therapeutic. Cont current rate.  Goal of Therapy:  Heparin level 0.3-0.7 units/ml aPTT 66-102 seconds Monitor platelets by anticoagulation protocol: Yes   Plan:  Cont heparin 1250 units/hr Daily heparin level and aPTT F/u cath plan   Ulyses Southward, PharmD, BCIDP, AAHIVP, CPP Infectious Disease Pharmacist 03/15/2023 12:41 PM

## 2023-03-15 NOTE — Progress Notes (Signed)
ANTICOAGULATION CONSULT NOTE  Pharmacy Consult for Eliquis >> IV heparin Indication: atrial fibrillation  No Known Allergies  Patient Measurements: Height: 5\' 6"  (167.6 cm) Weight: 81.1 kg (178 lb 12.7 oz) IBW/kg (Calculated) : 59.3 Heparin Dosing Weight: 76.2 kg  Vital Signs: Temp: 97.8 F (36.6 C) (06/29 2326) Temp Source: Oral (06/29 2326) BP: 123/60 (06/29 2326) Pulse Rate: 97 (06/29 2326)  Labs: Recent Labs    03/13/23 0109 03/13/23 0109 03/13/23 0258 03/13/23 1758 03/14/23 0557 03/14/23 1532 03/15/23 0128  HGB 11.4*  --   --   --  10.2*  --  9.9*  HCT 37.7  --   --   --  32.8*  --  31.6*  PLT 255  --   --   --  174  --  197  APTT  --    < >  --  38* 64* 64* 84*  HEPARINUNFRC  --   --   --  >1.10* >1.10*  --  >1.10*  CREATININE 1.65*  --   --   --  2.15*  --  1.70*  TROPONINIHS 39*  --  40*  --   --   --   --    < > = values in this interval not displayed.     Estimated Creatinine Clearance: 32.6 mL/min (A) (by C-G formula based on SCr of 1.7 mg/dL (H)).   Medical History: Past Medical History:  Diagnosis Date   A-fib (HCC)    Carotid artery disease (HCC)    COPD (chronic obstructive pulmonary disease) (HCC)    Coronary artery disease    Former tobacco use    Heart failure with mildly reduced ejection fraction (HFmrEF) (HCC)    Hx of CABG    Hypertension    PUD (peptic ulcer disease)     Medications:  Scheduled:   aspirin EC  81 mg Oral Daily   atorvastatin  40 mg Oral Daily   furosemide  60 mg Intravenous BID   hydrALAZINE  25 mg Oral Q8H   metoprolol tartrate  25 mg Oral BID   sertraline  100 mg Oral Daily    Assessment: 72 yo female presented with chest pain, epigastric pain, and emesis. Patient is on Eliquis 5mg  BID PTA for hx Afib. Eliquis was resumed on admission and last dose was 6/28 @ 0900. Pharmacy has been consulted to transition Eliquis to IV heparin with plans for cardiac cath.  Baseline aPTT 38 and heparin level >1.1 as  expected. Will monitor with aPTT until levels correlating.  aPTT remained the same at 64 (subtherapeutic) despite rate increase. No signs of bleeding reported.  6/30 AM update:  aPTT therapeutic after rate increase  Goal of Therapy:  Heparin level 0.3-0.7 units/ml aPTT 66-102 seconds Monitor platelets by anticoagulation protocol: Yes   Plan:  Cont heparin 1250 units/hr 8 hour heparin level and aPTT  Abran Duke, PharmD, BCPS Clinical Pharmacist Phone: [phone]

## 2023-03-15 NOTE — Progress Notes (Signed)
Ok to replace low k with kdur x1 per Dr. Dartha Lodge.  Ulyses Southward, PharmD, BCIDP, AAHIVP, CPP Infectious Disease Pharmacist 03/15/2023 8:23 AM

## 2023-03-15 NOTE — Progress Notes (Addendum)
R/LHC+grafts orders written per Dr. Marylene Buerger request to include [facility] IVF. He discussed consent with patient. Already scheduled tomorrow @ 1030am. Also sent msg to J O'Neal re: trying to obtain CABG records from [facility] for anatomy.

## 2023-03-15 NOTE — Progress Notes (Signed)
Cardiology Progress Note  Patient ID: Monica Zamora MRN: [id number] DOB: 07/30/1951 Date of Encounter: 03/15/2023  Primary Cardiologist: Olga Millers, MD  Subjective   Chief Complaint: SOB  HPI: Still short of breath.  Volume status improving.  Creatinine improving.  ROS:  All other ROS reviewed and negative. Pertinent positives noted in the HPI.     Inpatient Medications  Scheduled Meds:  aspirin EC  81 mg Oral Daily   atorvastatin  40 mg Oral Daily   furosemide  60 mg Intravenous BID   hydrALAZINE  25 mg Oral Q8H   metoprolol tartrate  25 mg Oral BID   potassium chloride  40 mEq Oral Once   sertraline  100 mg Oral Daily   spironolactone  12.5 mg Oral Daily   Continuous Infusions:  heparin 1,250 Units/hr (03/14/23 1620)   sodium chloride Stopped (03/13/23 0305)   PRN Meds: acetaminophen, ALPRAZolam, melatonin, polyethylene glycol, prochlorperazine   Vital Signs   Vitals:   03/14/23 2020 03/14/23 2326 03/15/23 0340 03/15/23 0717  BP: 108/80 123/60 112/70 (!) 104/91  Pulse: 91 97 73 88  Resp: 20 20 20 18   Temp: 97.9 F (36.6 C) 97.8 F (36.6 C) (!) 97.4 F (36.3 C) (!) 97.5 F (36.4 C)  TempSrc: Oral Oral Oral Oral  SpO2: 96% 93% 93% 98%  Weight:   82.1 kg   Height:        Intake/Output Summary (Last 24 hours) at 03/15/2023 1006 Last data filed at 03/15/2023 0100 Gross per 24 hour  Intake 406.95 ml  Output 600 ml  Net -193.05 ml      03/15/2023    3:40 AM 03/14/2023    5:43 AM 03/13/2023    1:07 AM  Last 3 Weights  Weight (lbs) 181 lb 178 lb 12.7 oz 179 lb  Weight (kg) 82.1 kg 81.1 kg 81.194 kg      Telemetry  Overnight telemetry shows A-fib 80 to 100 bpm, which I personally reviewed.     Physical Exam   Vitals:   03/14/23 2020 03/14/23 2326 03/15/23 0340 03/15/23 0717  BP: 108/80 123/60 112/70 (!) 104/91  Pulse: 91 97 73 88  Resp: 20 20 20 18   Temp: 97.9 F (36.6 C) 97.8 F (36.6 C) (!) 97.4 F (36.3 C) (!) 97.5 F (36.4 C)   TempSrc: Oral Oral Oral Oral  SpO2: 96% 93% 93% 98%  Weight:   82.1 kg   Height:        Intake/Output Summary (Last 24 hours) at 03/15/2023 1006 Last data filed at 03/15/2023 0100 Gross per 24 hour  Intake 406.95 ml  Output 600 ml  Net -193.05 ml       03/15/2023    3:40 AM 03/14/2023    5:43 AM 03/13/2023    1:07 AM  Last 3 Weights  Weight (lbs) 181 lb 178 lb 12.7 oz 179 lb  Weight (kg) 82.1 kg 81.1 kg 81.194 kg    Body mass index is 29.21 kg/m.  General: Well nourished, well developed, in no acute distress Head: Atraumatic, normal size  Eyes: PEERLA, EOMI  Neck: Supple, JVD 10-12 cmH2O Endocrine: No thryomegaly Cardiac: Normal S1, S2; irregular rhythm, no murmurs Lungs: Crackles at the lung bases Abd: Soft, nontender, no hepatomegaly  Ext: Trace edema Musculoskeletal: No deformities, BUE and BLE strength normal and equal Skin: Warm and dry, no rashes   Neuro: Alert and oriented to person, place, time, and situation, CNII-XII grossly intact, no focal deficits  Psych:  Normal mood and affect   Labs  High Sensitivity Troponin:   Recent Labs  Lab 03/13/23 0109 03/13/23 0258  TROPONINIHS 39* 40*     Cardiac EnzymesNo results for input(s): "TROPONINI" in the last 168 hours. No results for input(s): "TROPIPOC" in the last 168 hours.  Chemistry Recent Labs  Lab 03/13/23 0109 03/14/23 0557 03/15/23 0128  NA 140 136 137  K 3.9 3.9 3.4*  CL 106 104 101  CO2 19* 25 25  GLUCOSE 113* 93 105*  BUN 21 33* 33*  CREATININE 1.65* 2.15* 1.70*  CALCIUM 8.9 8.3* 8.3*  PROT 6.3* 5.7*  --   ALBUMIN 3.1* 2.9* 3.0*  AST 121* 41  --   ALT 42 37  --   ALKPHOS 175* 145*  --   BILITOT 1.2 0.7  --   GFRNONAA 33* 24* 32*  ANIONGAP 15 7 11     Hematology Recent Labs  Lab 03/13/23 0109 03/14/23 0557 03/15/23 0128  WBC 6.6 3.9* 4.2  RBC 3.38* 2.98* 2.98*  HGB 11.4* 10.2* 9.9*  HCT 37.7 32.8* 31.6*  MCV 111.5* 110.1* 106.0*  MCH 33.7 34.2* 33.2  MCHC 30.2 31.1 31.3  RDW  16.0* 15.7* 15.4  PLT 255 174 197   BNP Recent Labs  Lab 03/13/23 0109  BNP 1,662.5*    DDimer No results for input(s): "DDIMER" in the last 168 hours.   Radiology  No results found.  Cardiac Studies  TTE 03/04/2023  1. Left ventricular ejection fraction, by estimation, is 40 to 45%. The  left ventricle has mildly decreased function. The left ventricle  demonstrates global hypokinesis. Left ventricular diastolic parameters are  indeterminate.   2. Right ventricular systolic function is mildly reduced. The right  ventricular size is mildly enlarged. There is severely elevated pulmonary  artery systolic pressure. The estimated right ventricular systolic  pressure is 76.6 mmHg.   3. Left atrial size was mildly dilated.   4. Right atrial size was moderately dilated.   5. The mitral valve is grossly normal. Moderate mitral valve  regurgitation.   6. Tricuspid valve regurgitation is moderate.   7. The aortic valve is tricuspid. There is mild calcification of the  aortic valve. There is mild thickening of the aortic valve. Aortic valve  regurgitation is not visualized. Aortic valve sclerosis/calcification is  present, without any evidence of  aortic stenosis.   8. Aortic dilatation noted. There is borderline dilatation of the  ascending aorta, measuring 37 mm.   9. The inferior vena cava is dilated in size with >50% respiratory  variability, suggesting right atrial pressure of 8 mmHg.   Patient Profile  Monica Zamora is a 72 y.o. female with systolic heart failure, CAD status post CABG, pulmonary hypertension, persistent atrial fibrillation, hypertension, COPD, carotid artery disease, PAD, CKD admitted on 03/13/2023 for acute on chronic systolic heart failure and chest pain.   Assessment & Plan   # Acute systolic heart failure, EF 40-45% # Severe pulmonary hypertension -Remains volume overloaded.  Creatinine improving with diuresis.  Continue Lasix 60 mg IV twice  daily. -Right and left heart catheterization tomorrow. -Add Aldactone 12.5 mg daily.  Add Imdur 30 mg daily.  Continue hydralazine 25 mg 3 times daily.  On metoprolol tartrate.  Will consolidate this to succinate closer to discharge.  Hopefully eGFR will improve and we can add other agent such as ACE/ARB/ARNI.  Also would benefit from SGLT2 inhibitor.  Informed Consent   Shared Decision Making/Informed Consent The risks Gabriel Rung (  1 in 1000), death (1 in 1000), kidney failure [usually temporary] (1 in 500), bleeding (1 in 200), allergic reaction [possibly serious] (1 in 200)], benefits (diagnostic support and management of coronary artery disease) and alternatives of a cardiac catheterization were discussed in detail with Ms. Elsmore and she is willing to proceed.      # Chest pain # CAD status post CABG # Elevated troponin, demand -Troponin elevation is minimal and flat.  Likely related to demand in setting of decompensated heart failure.  She is on heparin drip for atrial fibrillation.  She is on a beta-blocker and statin.  Continue aspirin.  Left heart cath for workup as above.  # Persistent A-fib -Was on Multaq at home.  This will clearly need to be stopped in the setting of heart failure.  Continue with beta-blocker as above.  She is on heparin drip.  Holding Eliquis.  Left heart catheterization.  Ultimately would likely benefit from restoration of sinus rhythm.  We can time this based on the results of her cath.  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  I have ordered ultrasounds of her legs as well as abdomen.  Hopefully these can get done tomorrow.  No symptoms of mesenteric ischemia but she does have significant peripheral vascular disease.  She is on aspirin.  She is on a statin.  Likely will need to be followed.  Lactic  acid values are normal.  This is inconsistent with mesenteric ischemia.      For questions or updates, please contact [facility] Please consult [email or web address] for contact info under        Signed, Gerri Spore T. Flora Lipps, MD, [facility]  [facility]  03/15/2023 10:06 AM

## 2023-03-16 ENCOUNTER — Inpatient Hospital Stay (HOSPITAL_COMMUNITY): Payer: Medicare HMO

## 2023-03-16 DIAGNOSIS — I7090 Unspecified atherosclerosis: Secondary | ICD-10-CM

## 2023-03-16 DIAGNOSIS — K551 Chronic vascular disorders of intestine: Secondary | ICD-10-CM

## 2023-03-16 DIAGNOSIS — I5023 Acute on chronic systolic (congestive) heart failure: Secondary | ICD-10-CM | POA: Diagnosis not present

## 2023-03-16 LAB — APTT: aPTT: 75 seconds — ABNORMAL HIGH (ref 24–36)

## 2023-03-16 LAB — RENAL FUNCTION PANEL
Albumin: 3.1 g/dL — ABNORMAL LOW (ref 3.5–5.0)
Anion gap: 11 (ref 5–15)
BUN: 30 mg/dL — ABNORMAL HIGH (ref 8–23)
CO2: 26 mmol/L (ref 22–32)
Calcium: 8 mg/dL — ABNORMAL LOW (ref 8.9–10.3)
Chloride: 99 mmol/L (ref 98–111)
Creatinine, Ser: 1.88 mg/dL — ABNORMAL HIGH (ref 0.44–1.00)
GFR, Estimated: 28 mL/min — ABNORMAL LOW (ref >60)
Glucose, Bld: 110 mg/dL — ABNORMAL HIGH (ref 70–99)
Phosphorus: 3.4 mg/dL (ref 2.5–4.6)
Potassium: 3.7 mmol/L (ref 3.5–5.1)
Sodium: 136 mmol/L (ref 135–145)

## 2023-03-16 LAB — VAS US ABI WITH/WO TBI
Left ABI: 0.67
Right ABI: 0.66

## 2023-03-16 LAB — HEPARIN LEVEL (UNFRACTIONATED): Heparin Unfractionated: 1 IU/mL — ABNORMAL HIGH (ref 0.30–0.70)

## 2023-03-16 NOTE — Progress Notes (Signed)
Rounding Note    Patient Name: Monica Zamora Date of Encounter: 03/16/2023  [facility] Cardiologist: Olga Millers, MD   Subjective   No CP; no dyspnea  Inpatient Medications    Scheduled Meds:  aspirin EC  81 mg Oral Daily   atorvastatin  40 mg Oral Daily   furosemide  60 mg Intravenous BID   hydrALAZINE  25 mg Oral Q8H   isosorbide mononitrate  30 mg Oral Daily   metoprolol tartrate  25 mg Oral BID   sertraline  100 mg Oral Daily   sodium chloride flush  3 mL Intravenous Q12H   spironolactone  12.5 mg Oral Daily   Continuous Infusions:  sodium chloride     sodium chloride 10 mL/hr at 03/16/23 0704   heparin 1,250 Units/hr (03/14/23 1620)   sodium chloride Stopped (03/13/23 0305)   PRN Meds: sodium chloride, acetaminophen, ALPRAZolam, melatonin, polyethylene glycol, prochlorperazine, sodium chloride flush   Vital Signs    Vitals:   03/15/23 2148 03/16/23 0017 03/16/23 0337 03/16/23 0505  BP:  101/72 (!) 132/115   Pulse:  99 91   Resp:  (!) 21 (!) 24   Temp:  98.1 F (36.7 C) 97.9 F (36.6 C)   TempSrc:  Oral Oral   SpO2:  99% 100%   Weight: 82.1 kg   79.9 kg  Height:        Intake/Output Summary (Last 24 hours) at 03/16/2023 1191 Last data filed at 03/15/2023 1030 Gross per 24 hour  Intake --  Output 350 ml  Net -350 ml      03/16/2023    5:05 AM 03/15/2023    9:48 PM 03/15/2023    3:40 AM  Last 3 Weights  Weight (lbs) 176 lb 2.4 oz 181 lb 181 lb  Weight (kg) 79.9 kg 82.1 kg 82.1 kg      Telemetry    Atrial fibrillation rate controlled - Personally Reviewed   Physical Exam   GEN: No acute distress.   Neck: No JVD Cardiac: irregular Respiratory: Diminished BS throughout GI: Soft, nontender, non-distended  MS: No edema Neuro:  Nonfocal  Psych: Normal affect   Labs    High Sensitivity Troponin:   Recent Labs  Lab 03/13/23 0109 03/13/23 0258  TROPONINIHS 39* 40*     Chemistry Recent Labs  Lab 03/13/23 0109  03/14/23 0557 03/15/23 0128 03/16/23 0123  NA 140 136 137 136  K 3.9 3.9 3.4* 3.7  CL 106 104 101 99  CO2 19* 25 25 26   GLUCOSE 113* 93 105* 110*  BUN 21 33* 33* 30*  CREATININE 1.65* 2.15* 1.70* 1.88*  CALCIUM 8.9 8.3* 8.3* 8.0*  MG  --  2.0 1.8  --   PROT 6.3* 5.7*  --   --   ALBUMIN 3.1* 2.9* 3.0* 3.1*  AST 121* 41  --   --   ALT 42 37  --   --   ALKPHOS 175* 145*  --   --   BILITOT 1.2 0.7  --   --   GFRNONAA 33* 24* 32* 28*  ANIONGAP 15 7 11 11     Lipids  Recent Labs  Lab 03/14/23 0557  CHOL 135  TRIG 86  HDL 36*  LDLCALC 82  CHOLHDL 3.8    Hematology Recent Labs  Lab 03/13/23 0109 03/14/23 0557 03/15/23 0128  WBC 6.6 3.9* 4.2  RBC 3.38* 2.98* 2.98*  HGB 11.4* 10.2* 9.9*  HCT 37.7 32.8* 31.6*  MCV  111.5* 110.1* 106.0*  MCH 33.7 34.2* 33.2  MCHC 30.2 31.1 31.3  RDW 16.0* 15.7* 15.4  PLT 255 174 197    BNP Recent Labs  Lab 03/13/23 0109  BNP 1,662.5*      Patient Profile     Monica Zamora is a 72 y.o. female with systolic heart failure, CAD status post CABG, pulmonary hypertension, persistent atrial fibrillation, hypertension, COPD, carotid artery disease, PAD, CKD admitted on 03/13/2023 for acute on chronic systolic heart failure and chest pain.  Echocardiogram June 2024 showed ejection fraction 40 to 45%, mild right ventricular enlargement, severe pulmonary hypertension, mild left atrial enlargement, moderate right atrial enlargement, moderate mitral vegetation, moderate tricuspid regurgitation.  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  Also note of changes suggestive of cirrhosis.  Assessment & Plan    1 acute combined systolic/diastolic congestive heart failure-volume status has improved since admission.   Her dyspnea has also improved.  Plan is ultimately to proceed with right and left cardiac catheterization.  Her renal function has worsened slightly.  I will hold Lasix and spironolactone today.  Will resume following catheterization once renal function stable.  Continue low-dose hydralazine and nitrates.  Continue metoprolol but will transition to Toprol at discharge.  Will consider addition of SGLT2 inhibitor at discharge as well once renal function stable.  2 severe pulmonary hypertension-she is not volume overloaded on examination today.  Pulmonary hypertension is likely secondary to severe lung disease (she is on home oxygen for COPD).  Plan is right and left catheterization once renal function stable.  3 chest pain-troponins are not consistent with acute coronary syndrome.  We will hold on proceeding with right and left cardiac catheterization today as creatinine has mildly increased and she also received recent contrast for CTA.  Hold diuretics.  Recheck creatinine tomorrow morning.  If improving will plan right and left catheterization at that time.  4 coronary artery disease status post coronary bypass and graft-continue aspirin and statin.  5 persistent atrial fibrillation-Multaq has been discontinued in the setting of CHF.  Options for antiarrhythmic therapy are limited.  Renal function would not allow Tikosyn.  Amiodarone would not be a good choice in the setting of severe COPD.  Not a candidate for 1C agent in the setting of coronary disease.  Best option may be rate control and anticoagulation.  Continue heparin.  Will resume apixaban once all procedures complete.  6 acute kidney injury-creatinine is slightly increased today.  As outlined above we will hold diuretics and recheck tomorrow morning.  Proceed with catheterization if renal function stable.  7 peripheral vascular disease-continue statin.  8 Carotid artery disease-patient will need follow-up carotid Dopplers June 2025.  For  questions or updates, please contact [facility] Please consult [email or web address] for contact info under        Signed, Olga Millers, MD  03/16/2023, 7:12 AM

## 2023-03-16 NOTE — Progress Notes (Signed)
PROGRESS NOTE    Monica Zamora  GNF:[id number] DOB: [birth year] DOA: 03/13/2023 PCP: Rebecka Apley, NP  Outpatient Specialists:     Brief Narrative:  Patient is a 72 year old female, extensive vasculopath without noted significant workup, with history of coronary artery disease status post CABG, hypertension and atrial fibrillation. Patient was admitted with lower sternal/epigastric pain.  Chest pain was said to be radiating to the neck. Associated nausea and vomiting.  Non rising troponin.  EKG revealed A-fib with nonspecific T wave changes.  Cardiology plans left and right heart catheterization when renal function permits.  AKI on CKD 3 is noted.  Bump in serum creatinine noted today.  Vascular surgery team has also been consulted to assist with workup for extensive vasculopathy.  Lactic acid on presentation was normal.  CTA chest/abdomen/pelvis revealed: 1. No evidence of thoracoabdominal aortic aneurysm or dissection. 2. No pulmonary embolism identified. 3. Extensive multi-vessel coronary artery calcification, status post coronary artery bypass grafting. 4. Morphologic changes in keeping with pulmonary arterial hypertension and right heart failure with retrograde opacification of the hepatic venous system. 5. Mosaic attenuation within the pulmonary parenchyma and bronchial wall thickening in keeping with airway inflammation and multifocal air trapping. 6. 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 Clinical correlation for signs and symptoms of chronic mesenteric ischemia may be helpful. 7. Diffusely diminutive renal arteries bilaterally. Superimposed focal hemodynamically significant stenosis of the right renal artery origin. 8. Peripheral vascular disease with hemodynamically significant stenoses of the common iliac artery origins bilaterally and diffusely diseased lower extremity arterial inflow vasculature. 9.  Morphologic changes suggesting cirrhosis. This could be confirmed with hepatic elastography or tissue sampling. Mild ascites.   Aortic Atherosclerosis   03/14/2023: Patient seen.  No chest pain reported.  Cardiology input is highly appreciated.  For likely cardiac catheterization on Monday (2 days time), query right and left heart catheterization depending on renal function.  Patient is also on IV Lasix as there are concerns for possible cardiorenal component to the acute kidney injury.  03/15/2023: Patient seen.  No new complaints.  Improvement in renal function is noted.  Serum creatinine has improved from 2.15-1.7.  Potassium of 3.4 noted., will replete.  03/16/2023: Bump in serum creatinine noted.  Right and left cardiac catheterization has been postponed.  Monitor renal function tomorrow.  Possible cardiac catheterization tomorrow.  Vascular surgery team (Dr. Gillis Santa) has been consulted to assist with further workup and management of extensive vasculopathy.  Assessment & Plan:   Principal Problem:   Acute on chronic systolic (congestive) heart failure (HCC)  Acute on chronic HFrEF 40-45% -Last 2D echo done on 03/04/2023 revealed LVEF 40-45%, left ventricle global hypokinesis -Patient presented with volume overload on exam, right JVD, bilateral lower extremity pitting edema, BNP greater than 1600 -IV Lasix.   -Start strict I's and O's and daily weight -Cardiology team is directing admission. 03/16/2023: Lasix is on hold due to worsening serum creatinine.  Monitor renal function in the morning.  Chest pain:  Elevated troponin: Troponin flat, 39, repeat 40 For possible left and right heart catheterization tomorrow. Continue to monitor renal function.   03/16/2023: For left and right cardiac catheterization if renal function permits.   AKI on CKD 3A: -Baseline creatinine appears to be 1.1 with GFR 51 -Presented with creatinine of 1.64 with GFR of 33 -AKI is likely multifactorial,  including, but not limited to ATN/cardiorenal syndrome/exposure to contrast. -Aggressive diuresis. -Avoid hypotension. -Avoid nephrotoxic medications. -Keep  MAP greater than 65 mmHg. -Strict I's and O's. -Dose all medications assuming GFR of less than 50 mL/min. -Serum creatinine improved from 2.15-1.7 today. 03/16/2023: Lasix is on hold.  Monitor renal function and electrolytes.  Elevated liver chemistries, unclear etiology, suspect related to alcohol use -Initially elevated AST, but back to normal. -Continue to monitor for now -Avoid hepatotoxic agents 03/16/2023: On further questioning, patient admitted today that she had been an alcoholic for over 25 years.  Patient claims that she quit alcohol use 2 months ago.   Permanent atrial fibrillation, currently rate controlled, on Eliquis Initially presented hypotensive and bradycardic, home beta-blocker was reversed with glucagon Rate control agent has been held Resume home Eliquis Closely monitor on telemetry Consult cardiology in the morning   Chronic anxiety/depression Resume home Zoloft   Chronic hypoxic respiratory failure on 2 L nasal cannula continuously Appears to be at her baseline oxygen requirement Maintain O2 saturation above 90%  Extensive vascular disease: -Vascular surgery team has been consulted. -Abdominal pain on presentation, but with normal lactic acid.  DVT prophylaxis: Heparin drip. Code Status: Full code. Family Communication:  Disposition Plan: This will depend on hospital course.   Consultants:  Cardiology.  Procedures:  Cardiac catheterization is planned.  Antimicrobials:  None.   Subjective: No chest pain.  Objective: Vitals:   03/16/23 0748 03/16/23 0858 03/16/23 0900 03/16/23 1210  BP: 100/70 130/70 130/70 (!) 115/54  Pulse: 97 93 72 90  Resp: 16  18 15   Temp: 98 F (36.7 C)  98.4 F (36.9 C) 97.8 F (36.6 C)  TempSrc: Oral  Oral Oral  SpO2: 99% 97% 99% 100%  Weight:       Height:       No intake or output data in the 24 hours ending 03/16/23 1425  Filed Weights   03/15/23 0340 03/15/23 2148 03/16/23 0505  Weight: 82.1 kg 82.1 kg 79.9 kg    Examination:  General exam: Appears calm and comfortable  Respiratory system: Decreased air entry globally.   Cardiovascular system: S1 & S2 heard Gastrointestinal system: Abdomen is obese, soft and nontender.  Central nervous system: Awake and alert.  Patient moves all extremities.   Extremities: Bilateral lower extremity edema.  Data Reviewed: I have personally reviewed following labs and imaging studies  CBC: Recent Labs  Lab 03/13/23 0109 03/14/23 0557 03/15/23 0128  WBC 6.6 3.9* 4.2  NEUTROABS 4.7 2.5 3.0  HGB 11.4* 10.2* 9.9*  HCT 37.7 32.8* 31.6*  MCV 111.5* 110.1* 106.0*  PLT 255 174 197    Basic Metabolic Panel: Recent Labs  Lab 03/13/23 0109 03/14/23 0557 03/15/23 0128 03/16/23 0123  NA 140 136 137 136  K 3.9 3.9 3.4* 3.7  CL 106 104 101 99  CO2 19* 25 25 26   GLUCOSE 113* 93 105* 110*  BUN 21 33* 33* 30*  CREATININE 1.65* 2.15* 1.70* 1.88*  CALCIUM 8.9 8.3* 8.3* 8.0*  MG  --  2.0 1.8  --   PHOS  --  4.6 3.7 3.4    GFR: Estimated Creatinine Clearance: 29.2 mL/min (A) (by C-G formula based on SCr of 1.88 mg/dL (H)). Liver Function Tests: Recent Labs  Lab 03/13/23 0109 03/14/23 0557 03/15/23 0128 03/16/23 0123  AST 121* 41  --   --   ALT 42 37  --   --   ALKPHOS 175* 145*  --   --   BILITOT 1.2 0.7  --   --   PROT 6.3* 5.7*  --   --  ALBUMIN 3.1* 2.9* 3.0* 3.1*    No results for input(s): "LIPASE", "AMYLASE" in the last 168 hours. No results for input(s): "AMMONIA" in the last 168 hours. Coagulation Profile: No results for input(s): "INR", "PROTIME" in the last 168 hours. Cardiac Enzymes: No results for input(s): "CKTOTAL", "CKMB", "CKMBINDEX", "TROPONINI" in the last 168 hours. BNP (last 3 results) No results for input(s): "PROBNP" in the last 8760  hours. HbA1C: No results for input(s): "HGBA1C" in the last 72 hours. CBG: No results for input(s): "GLUCAP" in the last 168 hours. Lipid Profile: Recent Labs    03/14/23 0557  CHOL 135  HDL 36*  LDLCALC 82  TRIG 86  CHOLHDL 3.8    Thyroid Function Tests: No results for input(s): "TSH", "T4TOTAL", "FREET4", "T3FREE", "THYROIDAB" in the last 72 hours. Anemia Panel: No results for input(s): "VITAMINB12", "FOLATE", "FERRITIN", "TIBC", "IRON", "RETICCTPCT" in the last 72 hours. Urine analysis:    Component Value Date/Time   COLORURINE STRAW (A) 03/14/2023 1640   APPEARANCEUR CLEAR 03/14/2023 1640   LABSPEC 1.008 03/14/2023 1640   PHURINE 5.0 03/14/2023 1640   GLUCOSEU NEGATIVE 03/14/2023 1640   HGBUR NEGATIVE 03/14/2023 1640   BILIRUBINUR NEGATIVE 03/14/2023 1640   KETONESUR NEGATIVE 03/14/2023 1640   PROTEINUR NEGATIVE 03/14/2023 1640   NITRITE NEGATIVE 03/14/2023 1640   LEUKOCYTESUR NEGATIVE 03/14/2023 1640   Sepsis Labs: @LABRCNTIP (procalcitonin:4,lacticidven:4)  )No results found for this or any previous visit (from the past 240 hour(s)).       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 SMA moderate              arteriosclerosis at the mid and distal segments. High grade              stenosis at the origin of IMA. High Risk Factors: Hypertension, hyperlipidemia, past history of smoking. Other Factors: S/P  CABG. Limitations: Air/bowel gas and obesity. Performing Technologist: Marilynne Halsted RDMS, RVT  Examination Guidelines: A complete evaluation includes B-mode imaging, spectral Doppler, color Doppler, and power Doppler as needed of all accessible portions of each vessel. Bilateral testing is considered an integral part of a complete examination. Limited examinations for reoccurring indications may be performed as noted.  Duplex Findings: +----------------------+--------+--------+------+-------------------+ Mesenteric            PSV cm/sEDV cm/sPlaque     Comments       +----------------------+--------+--------+------+-------------------+ Aorta Mid                68                                     +----------------------+--------+--------+------+-------------------+ Celiac Artery Origin  appears occluded   +----------------------+--------+--------+------+-------------------+ Celiac Artery Proximal  145                      abn flow       +----------------------+--------+--------+------+-------------------+ SMA Origin              219                                     +----------------------+--------+--------+------+-------------------+ SMA Proximal            343                                     +----------------------+--------+--------+------+-------------------+ SMA Mid                 310                                     +----------------------+--------+--------+------+-------------------+ SMA Distal              251                                     +----------------------+--------+--------+------+-------------------+ CHA                      40                                     +----------------------+--------+--------+------+-------------------+ Splenic                                     not well visualized +----------------------+--------+--------+------+-------------------+ IMA                     518                                      +----------------------+--------+--------+------+-------------------+    Summary: Mesenteric: 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 High grade stenosis at the origin of the IMA.  *See table(s) above for measurements and observations.     Preliminary    VAS [place] ABI WITH/WO TBI  Result Date: 03/16/2023  LOWER EXTREMITY DOPPLER STUDY Patient Name:  SEPTEMBER BRIZZOLARA  Date of Exam:   03/16/2023 Medical Rec #: [id number]            Accession #:    [id number] Date of Birth: 20-May-1951            Patient Gender: F Patient Age:   72 years Exam Location:  [facility] Procedure:      VAS [place] ABI WITH/WO TBI Referring Phys: [place] O'NEAL --------------------------------------------------------------------------------  Indications: Peripheral artery disease. Patient states weakness in her legs              without pain. High Risk Factors: Hypertension, hyperlipidemia, coronary artery disease. Other Factors: Status post CABG, COPD,carotid disease.  Performing Technologist: Marilynne Halsted RDMS,  RVT  Examination Guidelines: A complete evaluation includes at minimum, Doppler waveform signals and systolic blood pressure reading at the level of bilateral brachial, anterior tibial, and posterior tibial arteries, when vessel segments are accessible. Bilateral testing is considered an integral part of a complete examination. Photoelectric Plethysmograph (PPG) waveforms and toe systolic pressure readings are included as required and additional duplex testing as needed. Limited examinations for reoccurring indications may be performed as noted.  ABI Findings: +---------+------------------+-----+----------+--------+ Right    Rt Pressure (mmHg)IndexWaveform  Comment  +---------+------------------+-----+----------+--------+ Brachial 105                    triphasic          +---------+------------------+-----+----------+--------+ PTA       69                0.66 monophasic         +---------+------------------+-----+----------+--------+ DP       66                0.63 monophasic         +---------+------------------+-----+----------+--------+ Great Toe55                0.52 Abnormal           +---------+------------------+-----+----------+--------+ +---------+------------------+-----+----------+----------------+ Left     Lt Pressure (mmHg)IndexWaveform  Comment          +---------+------------------+-----+----------+----------------+ Brachial                        triphasic NA -IV upper arm +---------+------------------+-----+----------+----------------+ PTA      68                0.65 monophasic                 +---------+------------------+-----+----------+----------------+ DP       70                0.67 monophasic                 +---------+------------------+-----+----------+----------------+ Great Toe55                0.52 Abnormal                   +---------+------------------+-----+----------+----------------+ +-------+-----------+-----------+------------+------------+ ABI/TBIToday's ABIToday's TBIPrevious ABIPrevious TBI +-------+-----------+-----------+------------+------------+ Right  0.66       0.52                                +-------+-----------+-----------+------------+------------+ Left   0.67       0.52                                +-------+-----------+-----------+------------+------------+  No priors.  Summary: Right: Resting right ankle-brachial index indicates moderate right lower extremity arterial disease. The right toe-brachial index is abnormal. Left: Resting left ankle-brachial index indicates moderate left lower extremity arterial disease. The left toe-brachial index is abnormal. *See table(s) above for measurements and observations.     Preliminary         Scheduled Meds:  aspirin EC  81 mg Oral Daily   atorvastatin  40 mg Oral Daily    hydrALAZINE  25 mg Oral Q8H   isosorbide mononitrate  30 mg Oral Daily   metoprolol tartrate  25 mg Oral BID   sertraline  100 mg Oral Daily  sodium chloride flush  3 mL Intravenous Q12H   Continuous Infusions:  sodium chloride     sodium chloride 10 mL/hr at 03/16/23 0704   heparin 1,250 Units/hr (03/14/23 1620)   sodium chloride Stopped (03/13/23 0305)     LOS: 3 days    Time spent: 35 minutes.    Berton Mount, MD  [facility] Pager #: [phone] 7PM-7AM contact night coverage as above

## 2023-03-16 NOTE — Consult Note (Signed)
Hospital Consult    Reason for Consult: Concern for chronic mesenteric ischemia Requesting Physician: Hospital medicine MRN #:  [id number]  History of Present Illness: This is a 72 y.o. female with history outlined below who presented with vague epigastric pain, and chest pain that she stated was radiating to the neck.  There is also associated nausea and vomiting.  EKG revealed A-fib with nonspecific T wave changes with plans for left and right heart cath pending renal improvement.  Vascular surgery was called due to incidental findings of occluded celiac artery with moderate to severe atherosclerotic disease throughout the visceral segment admitted to the iliac vessels bilaterally.  On exam, Monica Zamora was doing well.  She says she felt much better than previous.  The acute abdominal pain lasted roughly 30 minutes, and resolved.  She denied any history of food fear, postprandial pain, weight loss.  She denied symptoms of claudication, but noticed that her legs would get tired with ambulation.  She stated her breathing limits her ability to ambulate more so than her legs.  She denied changes in bowel movements, urination  Past Medical History:  Diagnosis Date   A-fib (HCC)    Carotid artery disease (HCC)    COPD (chronic obstructive pulmonary disease) (HCC)    Coronary artery disease    Former tobacco use    Heart failure with mildly reduced ejection fraction (HFmrEF) (HCC)    Hx of CABG    Hypertension    PUD (peptic ulcer disease)     Past Surgical History:  Procedure Laterality Date   ABDOMINAL HYSTERECTOMY     CHOLECYSTECTOMY     CORONARY ARTERY BYPASS GRAFT     ruptured disc     SPINE SURGERY      No Known Allergies  Prior to Admission medications   Medication Sig Start Date End Date Taking? Authorizing Provider  acetaminophen (TYLENOL) 500 MG tablet Take 1,000 mg by mouth 2 (two) times daily as needed (pain).    [provider]  albuterol (VENTOLIN HFA) 108 (90 Base)  MCG/ACT inhaler Inhale 1 puff into the lungs 2 (two) times daily as needed for wheezing or shortness of breath. 07/10/21   [provider]  ALPRAZolam Prudy Feeler) 0.5 MG tablet Take 1 tablet (0.5 mg total) by mouth at bedtime as needed for anxiety. 07/29/21   Georganna Skeans, MD  apixaban (ELIQUIS) 5 MG TABS tablet Take 1 tablet (5 mg total) by mouth 2 (two) times daily. 03/12/23   Jodelle Gross, NP  aspirin 81 MG chewable tablet Chew 81 mg by mouth daily.    [provider]  Budeson-Glycopyrrol-Formoterol (BREZTRI AEROSPHERE) 160-9-4.8 MCG/ACT AERO Inhale 2 puffs into the lungs in the morning and at bedtime. Patient not taking: Reported on 03/12/2023 02/27/23   Charlott Holler, MD  dronedarone (MULTAQ) 400 MG tablet Take 400 mg by mouth 2 (two) times daily with a meal. 05/15/22   [provider]  furosemide (LASIX) 20 MG tablet Take 3 tablets (60 mg total) by mouth daily. 03/12/23   Jodelle Gross, NP  levalbuterol Pauline Aus) 1.25 MG/0.5ML nebulizer solution Take 1.25 mg by nebulization every 4 (four) hours as needed for wheezing or shortness of breath. Patient not taking: Reported on 03/12/2023 02/27/23   Charlott Holler, MD  metoprolol tartrate (LOPRESSOR) 25 MG tablet Take 0.5 tablets (12.5 mg total) by mouth 2 (two) times daily. 03/12/23   Jodelle Gross, NP  potassium chloride SA (KLOR-CON M) 20 MEQ tablet Take 1  tablet (20 mEq total) by mouth daily. 03/12/23   Jodelle Gross, NP  pravastatin (PRAVACHOL) 20 MG tablet Take 1 tablet (20 mg total) by mouth daily at 6 PM. 03/12/23 04/11/23  Jodelle Gross, NP  sertraline (ZOLOFT) 100 MG tablet Take 100 mg by mouth. 08/28/22   [provider]    Social History   Socioeconomic History   Marital status: Divorced    Spouse name: Not on file   Number of children: 2   Years of education: Not on file   Highest education level: 11th grade  Occupational History   Occupation: Retired  Tobacco Use    Smoking status: Former    Packs/day: 1.50    Years: 54.00    Additional pack years: 0.00    Total pack years: 81.00    Types: Cigarettes    Start date: 09/16/1967    Quit date: 08/05/2022    Years since quitting: 0.6   Smokeless tobacco: Never  Vaping Use   Vaping Use: Never used  Substance and Sexual Activity   Alcohol use: Not Currently   Drug use: Never   Sexual activity: Not on file  Other Topics Concern   Not on file  Social History Narrative   Not on file   Social Determinants of Health   Financial Resource Strain: Low Risk  (03/13/2023)   Overall Financial Resource Strain (CARDIA)    Difficulty of Paying Living Expenses: Not very hard  Food Insecurity: No Food Insecurity (03/13/2023)   Hunger Vital Sign    Worried About Running Out of Food in the Last Year: Never true    Ran Out of Food in the Last Year: Never true  Transportation Needs: No Transportation Needs (03/13/2023)   PRAPARE - Administrator, Civil Service (Medical): No    Lack of Transportation (Non-Medical): No  Physical Activity: Not on file  Stress: Not on file  Social Connections: Not on file  Intimate Partner Violence: Not on file   Family History  Problem Relation Age of Onset   Lupus Mother    Lung disease Neg Hx     ROS: Otherwise negative unless mentioned in HPI  Physical Examination  Vitals:   03/16/23 1941 03/16/23 2000  BP: 112/64   Pulse: 81   Resp: 16   Temp: 98 F (36.7 C)   SpO2: 92% 94%   Body mass index is 28.43 kg/m.  General:  WDWN in NAD Gait: Not observed HENT: WNL, normocephalic Pulmonary: Productive cough, some wheezing Cardiac:  irregular Abdomen:  soft, NT/ND, no masses Skin: Without rashes Vascular Exam/Pulses: Palpable dorsalis pedis pulses bilaterally Extremities: without ischemic changes, without Gangrene , without cellulitis; without open wounds;  Musculoskeletal: no muscle wasting or atrophy  Neurologic: A&O X 3;  No focal weakness or  paresthesias are detected; speech is fluent/normal Psychiatric:  The pt has Normal affect. Lymph:  Unremarkable  CBC    Component Value Date/Time   WBC 4.2 03/15/2023 0128   RBC 2.98 (L) 03/15/2023 0128   HGB 9.9 (L) 03/15/2023 0128   HCT 31.6 (L) 03/15/2023 0128   PLT 197 03/15/2023 0128   MCV 106.0 (H) 03/15/2023 0128   MCH 33.2 03/15/2023 0128   MCHC 31.3 03/15/2023 0128   RDW 15.4 03/15/2023 0128   LYMPHSABS 0.8 03/15/2023 0128   MONOABS 0.3 03/15/2023 0128   EOSABS 0.1 03/15/2023 0128   BASOSABS 0.1 03/15/2023 0128    BMET    Component Value Date/Time  NA 136 03/16/2023 0123   NA 138 01/27/2023 1216   K 3.7 03/16/2023 0123   CL 99 03/16/2023 0123   CO2 26 03/16/2023 0123   GLUCOSE 110 (H) 03/16/2023 0123   BUN 30 (H) 03/16/2023 0123   BUN 13 01/27/2023 1216   CREATININE 1.88 (H) 03/16/2023 0123   CALCIUM 8.0 (L) 03/16/2023 0123   GFRNONAA 28 (L) 03/16/2023 0123    COAGS: No results found for: "INR", "PROTIME"    ASSESSMENT/PLAN: This is a 72 y.o. female admitted with nonspecific T wave changes with planned left and right heart cath pending improvement in renal function.  She has no symptoms concerning for chronic mesenteric ischemia.  Furthermore, she has palpable pulses in the feet with no symptoms of claudication, ischemic rest pain, tissue loss.  While Monica Zamora does have significant atherosclerotic burden on CT, this is asymptomatic.  No role for endovascular intervention at this time.  She would be best treated with antiplatelet therapy and high intensity statin.   Fara Olden MD MS [facility] [phone] 03/16/2023  10:23 PM

## 2023-03-16 NOTE — Progress Notes (Signed)
Occupational Therapy Treatment Patient Details Name: Monica Zamora MRN: [id number] DOB: 04-29-1951 Today's Date: 03/16/2023   History of present illness 72 yo female admitted 6/28 with chest pain, bradycardia, hypotension and acute on chronic CHF. PMHx: Afib on Eliquis, HTN, respiratory failure on 2L, CAD s/p CABG, HFrEF   OT comments  Pt with slow progression towards goals, feels very fatigued today. Pt completing BSC > bed transfer with min guard A, supervision for seated pericare. Able to complete seated therex at EOB but declines further mobility/ADLs. Pt presenting with impairments listed below, will follow acutely. Continue to recommend HHOT at d/c pending progression.   Recommendations for follow up therapy are one component of a multi-disciplinary discharge planning process, led by the attending physician.  Recommendations may be updated based on patient status, additional functional criteria and insurance authorization.    Assistance Recommended at Discharge Intermittent Supervision/Assistance  Patient can return home with the following  A little help with walking and/or transfers;A little help with bathing/dressing/bathroom;Assistance with cooking/housework;Direct supervision/assist for medications management;Direct supervision/assist for financial management;Assist for transportation;Help with stairs or ramp for entrance   Equipment Recommendations  None recommended by OT (pt has all needed DME)    Recommendations for Other Services PT consult    Precautions / Restrictions Precautions Precautions: Fall;Other (comment) Precaution Comments: watch sats Restrictions Weight Bearing Restrictions: No       Mobility Bed Mobility Overal bed mobility: Modified Independent                  Transfers Overall transfer level: Needs assistance Equipment used: 1 person hand held assist Transfers: Sit to/from Stand, Bed to chair/wheelchair/BSC Sit to Stand: Min  guard Stand pivot transfers: Min guard               Balance Overall balance assessment: Needs assistance Sitting-balance support: Bilateral upper extremity supported Sitting balance-Leahy Scale: Good     Standing balance support: Bilateral upper extremity supported, During functional activity, Reliant on assistive device for balance Standing balance-Leahy Scale: Poor Standing balance comment: RW for gait                           ADL either performed or assessed with clinical judgement   ADL Overall ADL's : Needs assistance/impaired                         Toilet Transfer: Min guard;Stand-pivot;BSC/3in1   Toileting- Clothing Manipulation and Hygiene: Supervision/safety       Functional mobility during ADLs: Min guard      Extremity/Trunk Assessment Upper Extremity Assessment Upper Extremity Assessment: Generalized weakness   Lower Extremity Assessment Lower Extremity Assessment: Defer to PT evaluation        Vision   Vision Assessment?: No apparent visual deficits   Perception Perception Perception: Not tested   Praxis Praxis Praxis: Not tested    Cognition Arousal/Alertness: Awake/alert Behavior During Therapy: WFL for tasks assessed/performed Overall Cognitive Status: Within Functional Limits for tasks assessed                                          Exercises Exercises: Other exercises Other Exercises Other Exercises: seated marching x10 Other Exercises: seated forward dynamic reach x10    Shoulder Instructions       General Comments VSS on 4L O2  Pertinent Vitals/ Pain       Pain Assessment Pain Assessment: No/denies pain  Home Living                                          Prior Functioning/Environment              Frequency  Min 1X/week        Progress Toward Goals  OT Goals(current goals can now be found in the care plan section)  Progress towards OT goals:  Progressing toward goals  Acute Rehab OT Goals Patient Stated Goal: to rest OT Goal Formulation: With patient Time For Goal Achievement: 03/28/23 Potential to Achieve Goals: Good ADL Goals Pt Will Perform Lower Body Dressing: with modified independence;sitting/lateral leans;sit to/from stand Pt Will Perform Tub/Shower Transfer: Shower transfer;shower seat;ambulating;rolling walker Additional ADL Goal #1: pt will verbalize x3 energy conservation strategies in prep for ADLs  Plan Discharge plan remains appropriate;Frequency remains appropriate    Co-evaluation                 AM-PAC OT "6 Clicks" Daily Activity     Outcome Measure   Help from another person eating meals?: A Little Help from another person taking care of personal grooming?: A Little Help from another person toileting, which includes using toliet, bedpan, or urinal?: A Little Help from another person bathing (including washing, rinsing, drying)?: A Lot Help from another person to put on and taking off regular upper body clothing?: A Little Help from another person to put on and taking off regular lower body clothing?: A Little 6 Click Score: 17    End of Session Equipment Utilized During Treatment: Oxygen (4L)  OT Visit Diagnosis: Unsteadiness on feet (R26.81);Other abnormalities of gait and mobility (R26.89);Muscle weakness (generalized) (M62.81)   Activity Tolerance Patient tolerated treatment well   Patient Left with call bell/phone within reach;in chair;with chair alarm set   Nurse Communication Mobility status        Time: 6045-4098 OT Time Calculation (min): 18 min  Charges: OT General Charges $OT Visit: 1 Visit OT Treatments $Therapeutic Activity: 8-22 mins  Carver Fila, OTD, OTR/L SecureChat Preferred Acute Rehab [phone]   Carver Fila Koonce 03/16/2023, 2:58 PM

## 2023-03-16 NOTE — Progress Notes (Addendum)
ANTICOAGULATION CONSULT NOTE  Pharmacy Consult for Eliquis >> IV heparin Indication: atrial fibrillation  No Known Allergies  Patient Measurements: Height: 5\' 6"  (167.6 cm) Weight: 79.9 kg (176 lb 2.4 oz) IBW/kg (Calculated) : 59.3 Heparin Dosing Weight: 76.2 kg  Vital Signs: Temp: 97.9 F (36.6 C) (07/01 0337) Temp Source: Oral (07/01 0337) BP: 132/115 (07/01 0337) Pulse Rate: 91 (07/01 0337)  Labs: Recent Labs    03/14/23 0557 03/14/23 1532 03/15/23 0128 03/15/23 1109 03/16/23 0123  HGB 10.2*  --  9.9*  --   --   HCT 32.8*  --  31.6*  --   --   PLT 174  --  197  --   --   APTT 64*   < > 84* 84* 75*  HEPARINUNFRC >1.10*  --  >1.10* >1.10* 1.00*  CREATININE 2.15*  --  1.70*  --  1.88*   < > = values in this interval not displayed.     Estimated Creatinine Clearance: 29.2 mL/min (A) (by C-G formula based on SCr of 1.88 mg/dL (H)).   Assessment: 72 yo female presented with chest pain, epigastric pain, and emesis. Patient is on Eliquis 5mg  BID PTA for hx Afib. Eliquis was resumed on admission and last dose was 6/28 @ 0900. Pharmacy has been consulted to transition Eliquis to IV heparin with plans for cardiac cath.  Baseline aPTT 38 and heparin level >1.1 as expected. Will monitor with aPTT until levels correlating.  aPTT is therapeutic at 75. No signs of bleeding reported.  Goal of Therapy:  Heparin level 0.3-0.7 units/ml aPTT 66-102 seconds Monitor platelets by anticoagulation protocol: Yes   Plan:  Continue heparin drip at 1250 units/hr Daily aPTT, heparin level, CBC Monitor for s/sx of bleeding F/U after cath today  Thank you for involving pharmacy in this patient's care.  Loura Back, PharmD, BCPS Clinical Pharmacist Clinical phone for 03/16/2023 is [phone] 03/16/2023 7:23 AM   Addendum:  Cath rescheduled to 7/2 if renal function improves. Continue heparin drip and f/u morning aPPT and heparin level.  Loura Back, PharmD, BCPS 1:48  PM

## 2023-03-16 NOTE — Progress Notes (Signed)
   Heart Failure Stewardship Pharmacist Progress Note   PCP: Rebecka Apley, NP PCP-Cardiologist: Olga Millers, MD    HPI:  72 yo F with PMH of CHF, afib, HTN, chronic hypoxic respiratory failure, CAD s/p CABG, and carotid artery disease.  Was seen in cardiology on 5/14 and complained of worsening dyspnea on exertion. Noted increased LE edema. ECHO 6/19 showed LVEF reduced to 40-45%, global hypokinesis, RV mildly reduced, moderate MR.  Followed up with cardiology on 6/27. HR was elevated to 90-100s during visit. She was not taking her metoprolol as prescribed. Advised to start taking it twice daily. Son was going to prepare med box for her.   She presented to the ED on 6/28 with new onset chest pain and epigastric pain associated with exertional dyspnea. In the ED, patient was hypotensive with SBP in the 70s and bradycardic with rates 30-40s. BB was reversed with glucagon with improvement in BP and HR. BNP was also elevated and noted to have bilateral LE edema and JVD. CXR without active cardiopulmonary disease. CTA without aneurysm or dissection, no PE. Notable for morphologic changes with pulmonary arterial hypertension and right heart failure.  R/LHC scheduled for 7/2.  Current HF Medications: Beta Blocker: metoprolol 25 mg BID Other: hydralazine 25 mg TID + Imdur 30 mg daily  Prior to admission HF Medications: Diuretic: furosemide 60 mg daily Beta blocker: metoprolol tartrate 12.5 mg BID  Pertinent Lab Values: Serum creatinine 1.88, BUN 30, Potassium 3.7, Sodium 136, Magnesium 1.8, BNP 1662.5  Vital Signs: Weight: 176 lbs (admission weight: 179 lbs) Blood pressure: 100-130/70s  Heart rate: 90s  I/O: net -0.4L  Medication Assistance / Insurance Benefits Check: Does the patient have prescription insurance?  Yes Type of insurance plan: [place] Medicaid  Outpatient Pharmacy:  Prior to admission outpatient pharmacy: [facility] - offers delivery service Is the patient  willing to use [facility] TOC pharmacy at discharge? Yes Is the patient willing to transition their outpatient pharmacy to utilize a [facility] outpatient pharmacy?   No    Assessment: 1. Acute on chronic HFmrEF (LVEF 40-45%). R/LHC 7/2. NYHA class III symptoms. - Off IV lasix and spironolactone with AKI. Strict I/Os and daily weights. Keep K>4 and Mg>2.  - Continue metoprolol tartrate 25 mg BID - BP and renal function limiting GDMT - Continue hydralazine 25 mg TID + Imdur 30 mg daily - Stopped dronaderone with HF exacerbation, would not resume on discharge   Plan: 1) Medication changes recommended at this time: - None pending cath tomorrow, creatinine bumped today  2) Patient assistance: - Farxiga/Jardiance copay $0 - Entresto copay $0  3)  Education  - Patient has been educated on current HF medications and potential additions to HF medication regimen - Patient verbalizes understanding that over the next few months, these medication doses may change and more medications may be added to optimize HF regimen - Patient has been educated on basic disease state pathophysiology and goals of therapy   Sharen Hones, PharmD, BCPS Heart Failure Stewardship Pharmacist Phone [phone]

## 2023-03-16 NOTE — Care Management Important Message (Signed)
Important Message  Patient Details  Name: Monica Zamora MRN: [id number] Date of Birth: [birth year]   Medicare Important Message Given:  Yes     Renie Ora 03/16/2023, 8:59 AM

## 2023-03-16 NOTE — Progress Notes (Signed)
IV started by staff RN.

## 2023-03-17 ENCOUNTER — Encounter (HOSPITAL_COMMUNITY)
Admission: EM | Disposition: A | Discharge status: Home / Self Care | Payer: Self-pay | Source: Home / Self Care | Attending: Internal Medicine

## 2023-03-17 ENCOUNTER — Inpatient Hospital Stay (HOSPITAL_COMMUNITY): Payer: Medicare HMO

## 2023-03-17 ENCOUNTER — Other Ambulatory Visit: Payer: Self-pay

## 2023-03-17 DIAGNOSIS — N1831 Chronic kidney disease, stage 3a: Secondary | ICD-10-CM | POA: Diagnosis not present

## 2023-03-17 DIAGNOSIS — N179 Acute kidney failure, unspecified: Secondary | ICD-10-CM

## 2023-03-17 DIAGNOSIS — I2510 Atherosclerotic heart disease of native coronary artery without angina pectoris: Secondary | ICD-10-CM | POA: Diagnosis not present

## 2023-03-17 DIAGNOSIS — I5023 Acute on chronic systolic (congestive) heart failure: Secondary | ICD-10-CM | POA: Diagnosis not present

## 2023-03-17 DIAGNOSIS — I4819 Other persistent atrial fibrillation: Secondary | ICD-10-CM | POA: Diagnosis not present

## 2023-03-17 HISTORY — PX: RIGHT/LEFT HEART CATH AND CORONARY/GRAFT ANGIOGRAPHY: CATH118267

## 2023-03-17 LAB — POCT I-STAT EG7
Acid-Base Excess: 0 mmol/L (ref 0.0–2.0)
Acid-Base Excess: 2 mmol/L (ref 0.0–2.0)
Bicarbonate: 27.3 mmol/L (ref 20.0–28.0)
Bicarbonate: 28.3 mmol/L — ABNORMAL HIGH (ref 20.0–28.0)
Calcium, Ion: 1.19 mmol/L (ref 1.15–1.40)
Calcium, Ion: 1.2 mmol/L (ref 1.15–1.40)
HCT: 31 % — ABNORMAL LOW (ref 36.0–46.0)
HCT: 32 % — ABNORMAL LOW (ref 36.0–46.0)
Hemoglobin: 10.5 g/dL — ABNORMAL LOW (ref 12.0–15.0)
Hemoglobin: 10.9 g/dL — ABNORMAL LOW (ref 12.0–15.0)
O2 Saturation: 54 %
O2 Saturation: 54 %
Potassium: 4.4 mmol/L (ref 3.5–5.1)
Potassium: 4.4 mmol/L (ref 3.5–5.1)
Sodium: 139 mmol/L (ref 135–145)
Sodium: 139 mmol/L (ref 135–145)
TCO2: 29 mmol/L (ref 22–32)
TCO2: 30 mmol/L (ref 22–32)
pCO2, Ven: 53.4 mmHg (ref 44–60)
pCO2, Ven: 53.6 mmHg (ref 44–60)
pH, Ven: 7.316 (ref 7.25–7.43)
pH, Ven: 7.331 (ref 7.25–7.43)
pO2, Ven: 31 mmHg — CL (ref 32–45)
pO2, Ven: 32 mmHg (ref 32–45)

## 2023-03-17 LAB — RENAL FUNCTION PANEL
Albumin: 2.9 g/dL — ABNORMAL LOW (ref 3.5–5.0)
Anion gap: 7 (ref 5–15)
BUN: 24 mg/dL — ABNORMAL HIGH (ref 8–23)
CO2: 27 mmol/L (ref 22–32)
Calcium: 8.5 mg/dL — ABNORMAL LOW (ref 8.9–10.3)
Chloride: 103 mmol/L (ref 98–111)
Creatinine, Ser: 1.24 mg/dL — ABNORMAL HIGH (ref 0.44–1.00)
GFR, Estimated: 47 mL/min — ABNORMAL LOW (ref >60)
Glucose, Bld: 124 mg/dL — ABNORMAL HIGH (ref 70–99)
Phosphorus: 2.9 mg/dL (ref 2.5–4.6)
Potassium: 3.7 mmol/L (ref 3.5–5.1)
Sodium: 137 mmol/L (ref 135–145)

## 2023-03-17 LAB — POCT I-STAT 7, (LYTES, BLD GAS, ICA,H+H)
Acid-Base Excess: 0 mmol/L (ref 0.0–2.0)
Bicarbonate: 25.7 mmol/L (ref 20.0–28.0)
Calcium, Ion: 1.21 mmol/L (ref 1.15–1.40)
HCT: 31 % — ABNORMAL LOW (ref 36.0–46.0)
Hemoglobin: 10.5 g/dL — ABNORMAL LOW (ref 12.0–15.0)
O2 Saturation: 96 %
Potassium: 4.5 mmol/L (ref 3.5–5.1)
Sodium: 138 mmol/L (ref 135–145)
TCO2: 27 mmol/L (ref 22–32)
pCO2 arterial: 46.5 mmHg (ref 32–48)
pH, Arterial: 7.351 (ref 7.35–7.45)
pO2, Arterial: 87 mmHg (ref 83–108)

## 2023-03-17 LAB — HEPARIN LEVEL (UNFRACTIONATED): Heparin Unfractionated: 0.68 IU/mL (ref 0.30–0.70)

## 2023-03-17 LAB — APTT: aPTT: 93 seconds — ABNORMAL HIGH (ref 24–36)

## 2023-03-17 SURGERY — RIGHT/LEFT HEART CATH AND CORONARY/GRAFT ANGIOGRAPHY
Anesthesia: LOCAL

## 2023-03-17 MED ORDER — LABETALOL HCL 5 MG/ML IV SOLN: 10.0000 mg | INTRAVENOUS | Status: AC | PRN

## 2023-03-17 MED ORDER — SODIUM CHLORIDE 0.9% FLUSH
10.0000 mL | Freq: Two times a day (BID) | INTRAVENOUS | Status: DC
  Administered 2023-03-17 – 2023-03-23 (×9): 10 mL

## 2023-03-17 MED ORDER — SODIUM CHLORIDE 0.9% FLUSH: 10.0000 mL | INTRAVENOUS | Status: DC | PRN

## 2023-03-17 MED ORDER — FUROSEMIDE 10 MG/ML IJ SOLN
60.0000 mg | Freq: Two times a day (BID) | INTRAMUSCULAR | Status: DC
  Administered 2023-03-17 – 2023-03-18 (×2): 60 mg via INTRAVENOUS
  Filled 2023-03-17 (×2): qty 6

## 2023-03-17 MED ORDER — SODIUM CHLORIDE 0.9 % IV SOLN: INTRAVENOUS | Status: DC

## 2023-03-17 MED ORDER — SODIUM CHLORIDE 0.9 % IV SOLN: 250.0000 mL | INTRAVENOUS | Status: DC | PRN

## 2023-03-17 MED ORDER — VERAPAMIL HCL 2.5 MG/ML IV SOLN
INTRAVENOUS | Status: DC | PRN
  Administered 2023-03-17: 10 mL via INTRA_ARTERIAL

## 2023-03-17 MED ORDER — CHLORHEXIDINE GLUCONATE CLOTH 2 % EX PADS
6.0000 | MEDICATED_PAD | Freq: Every day | CUTANEOUS | Status: DC
  Administered 2023-03-17 – 2023-03-25 (×10): 6 via TOPICAL

## 2023-03-17 MED ORDER — HEPARIN SODIUM (PORCINE) 1000 UNIT/ML IJ SOLN
INTRAMUSCULAR | Status: DC | PRN
  Administered 2023-03-17: 4000 [IU] via INTRAVENOUS

## 2023-03-17 MED ORDER — ONDANSETRON HCL 4 MG/2ML IJ SOLN: 4.0000 mg | Freq: Four times a day (QID) | INTRAMUSCULAR | Status: DC | PRN

## 2023-03-17 MED ORDER — HEPARIN SODIUM (PORCINE) 1000 UNIT/ML IJ SOLN
INTRAMUSCULAR | Status: AC
  Filled 2023-03-17: qty 10

## 2023-03-17 MED ORDER — POTASSIUM CHLORIDE CRYS ER 20 MEQ PO TBCR
40.0000 meq | EXTENDED_RELEASE_TABLET | Freq: Once | ORAL | Status: AC
  Administered 2023-03-17: 40 meq via ORAL
  Filled 2023-03-17: qty 2

## 2023-03-17 MED ORDER — LIDOCAINE HCL (PF) 1 % IJ SOLN
INTRAMUSCULAR | Status: AC
  Filled 2023-03-17: qty 30

## 2023-03-17 MED ORDER — DAPAGLIFLOZIN PROPANEDIOL 10 MG PO TABS
10.0000 mg | ORAL_TABLET | Freq: Every day | ORAL | Status: DC
  Administered 2023-03-17 – 2023-03-22 (×6): 10 mg via ORAL
  Filled 2023-03-17 (×6): qty 1

## 2023-03-17 MED ORDER — LIDOCAINE HCL (PF) 1 % IJ SOLN
INTRAMUSCULAR | Status: DC | PRN
  Administered 2023-03-17: 2 mL via INTRADERMAL

## 2023-03-17 MED ORDER — VERAPAMIL HCL 2.5 MG/ML IV SOLN
INTRAVENOUS | Status: AC
  Filled 2023-03-17: qty 2

## 2023-03-17 MED ORDER — SODIUM CHLORIDE 0.9% FLUSH: 3.0000 mL | INTRAVENOUS | Status: DC | PRN

## 2023-03-17 MED ORDER — SPIRONOLACTONE 12.5 MG HALF TABLET
12.5000 mg | ORAL_TABLET | Freq: Every day | ORAL | Status: DC
  Administered 2023-03-17 – 2023-03-21 (×5): 12.5 mg via ORAL
  Filled 2023-03-17 (×5): qty 1

## 2023-03-17 MED ORDER — SODIUM CHLORIDE 0.9 % IV SOLN: INTRAVENOUS | Status: AC

## 2023-03-17 MED ORDER — HEPARIN (PORCINE) IN NACL 1000-0.9 UT/500ML-% IV SOLN
INTRAVENOUS | Status: DC | PRN
  Administered 2023-03-17 (×2): 500 mL

## 2023-03-17 MED ORDER — FUROSEMIDE 10 MG/ML IJ SOLN
INTRAMUSCULAR | Status: AC
  Filled 2023-03-17: qty 8

## 2023-03-17 MED ORDER — FUROSEMIDE 10 MG/ML IJ SOLN
INTRAMUSCULAR | Status: DC | PRN
  Administered 2023-03-17: 80 mg via INTRAVENOUS

## 2023-03-17 MED ORDER — ASPIRIN 81 MG PO CHEW
81.0000 mg | CHEWABLE_TABLET | ORAL | Status: AC
  Administered 2023-03-17: 81 mg via ORAL
  Filled 2023-03-17: qty 1

## 2023-03-17 MED ORDER — HYDRALAZINE HCL 20 MG/ML IJ SOLN: 10.0000 mg | INTRAMUSCULAR | Status: AC | PRN

## 2023-03-17 MED ORDER — IOHEXOL 350 MG/ML SOLN
INTRAVENOUS | Status: DC | PRN
  Administered 2023-03-17: 115 mL

## 2023-03-17 MED ORDER — SODIUM CHLORIDE 0.9% FLUSH
3.0000 mL | Freq: Two times a day (BID) | INTRAVENOUS | Status: DC
  Administered 2023-03-17 – 2023-03-23 (×8): 3 mL via INTRAVENOUS

## 2023-03-17 MED ORDER — HEPARIN (PORCINE) 25000 UT/250ML-% IV SOLN
1250.0000 [IU]/h | INTRAVENOUS | Status: AC
  Administered 2023-03-17 – 2023-03-21 (×5): 1250 [IU]/h via INTRAVENOUS
  Filled 2023-03-17 (×4): qty 250

## 2023-03-17 SURGICAL SUPPLY — 15 items
CATH BALLN WEDGE 5F 110CM (CATHETERS) IMPLANT
CATH INFINITI 5FR MULTPACK ANG (CATHETERS) IMPLANT
DEVICE RAD COMP TR BAND LRG (VASCULAR PRODUCTS) IMPLANT
DEVICE RAD TR BAND REGULAR (VASCULAR PRODUCTS) IMPLANT
ELECT DEFIB PAD ADLT CADENCE (PAD) IMPLANT
GLIDESHEATH SLEND SS 6F .021 (SHEATH) IMPLANT
GUIDEWIRE INQWIRE 1.5J.035X260 (WIRE) IMPLANT
INQWIRE 1.5J .035X260CM (WIRE) ×1
KIT HEART LEFT (KITS) ×1 IMPLANT
PACK CARDIAC CATHETERIZATION (CUSTOM PROCEDURE TRAY) ×1 IMPLANT
SHEATH GLIDE SLENDER 4/5FR (SHEATH) IMPLANT
SHEATH PROBE COVER 6X72 (BAG) IMPLANT
TRANSDUCER W/STOPCOCK (MISCELLANEOUS) ×1 IMPLANT
TUBING CIL FLEX 10 FLL-RA (TUBING) ×1 IMPLANT
WIRE EMERALD 3MM-J .025X260CM (WIRE) IMPLANT

## 2023-03-17 NOTE — Progress Notes (Addendum)
 Rounding Note    Patient Name: Monica Zamora Date of Encounter: 03/17/2023  [facility] Cardiologist: Joal Eakle, MD   Subjective   Pt denies CP or dyspnea  Inpatient Medications    Scheduled Meds:  aspirin EC  81 mg Oral Daily   atorvastatin  40 mg Oral Daily   hydrALAZINE  25 mg Oral Q8H   isosorbide mononitrate  30 mg Oral Daily   metoprolol tartrate  25 mg Oral BID   sertraline  100 mg Oral Daily   sodium chloride flush  3 mL Intravenous Q12H   Continuous Infusions:  sodium chloride 10 mL/hr at 03/17/23 0200   sodium chloride 10 mL/hr at 03/16/23 0704   sodium chloride 10 mL/hr at 03/17/23 0509   heparin 1,250 Units/hr (03/17/23 0527)   sodium chloride Stopped (03/13/23 0305)   PRN Meds: sodium chloride, acetaminophen, ALPRAZolam, melatonin, polyethylene glycol, prochlorperazine, sodium chloride flush   Vital Signs    Vitals:   03/16/23 2000 03/16/23 2336 03/17/23 0331 03/17/23 0527  BP:  (!) 125/94 127/79   Pulse:  92 83   Resp:  18 18   Temp:  97.6 F (36.4 C) 98.1 F (36.7 C)   TempSrc:  Oral Oral   SpO2: 94% 90% 94%   Weight:    80.4 kg  Height:    5' 6" (1.676 m)    Intake/Output Summary (Last 24 hours) at 03/17/2023 0703 Last data filed at 03/17/2023 0527 Gross per 24 hour  Intake 1741.73 ml  Output 250 ml  Net 1491.73 ml       03/17/2023    5:27 AM 03/16/2023    5:05 AM 03/15/2023    9:48 PM  Last 3 Weights  Weight (lbs) 177 lb 4 oz 176 lb 2.4 oz 181 lb  Weight (kg) 80.4 kg 79.9 kg 82.1 kg      Telemetry    Atrial fibrillation rate controlled - Personally Reviewed   Physical Exam   GEN: NAD Neck: supple Cardiac: irregular, no gallop Respiratory: Diminished BS throughout; no wheeze GI: Soft, NT/ND MS: No edema Neuro:  Grossly intact  Psych: Normal affect   Labs    High Sensitivity Troponin:   Recent Labs  Lab 03/13/23 0109 03/13/23 0258  TROPONINIHS 39* 40*      Chemistry Recent Labs  Lab  03/13/23 0109 03/14/23 0557 03/15/23 0128 03/16/23 0123 03/17/23 0136  NA 140 136 137 136 137  K 3.9 3.9 3.4* 3.7 3.7  CL 106 104 101 99 103  CO2 19* 25 25 26 27  GLUCOSE 113* 93 105* 110* 124*  BUN 21 33* 33* 30* 24*  CREATININE 1.65* 2.15* 1.70* 1.88* 1.24*  CALCIUM 8.9 8.3* 8.3* 8.0* 8.5*  MG  --  2.0 1.8  --   --   PROT 6.3* 5.7*  --   --   --   ALBUMIN 3.1* 2.9* 3.0* 3.1* 2.9*  AST 121* 41  --   --   --   ALT 42 37  --   --   --   ALKPHOS 175* 145*  --   --   --   BILITOT 1.2 0.7  --   --   --   GFRNONAA 33* 24* 32* 28* 47*  ANIONGAP 15 7 11 11 7     Lipids  Recent Labs  Lab 03/14/23 0557  CHOL 135  TRIG 86  HDL 36*  LDLCALC 82  CHOLHDL 3.8       Hematology Recent Labs  Lab 03/13/23 0109 03/14/23 0557 03/15/23 0128  WBC 6.6 3.9* 4.2  RBC 3.38* 2.98* 2.98*  HGB 11.4* 10.2* 9.9*  HCT 37.7 32.8* 31.6*  MCV 111.5* 110.1* 106.0*  MCH 33.7 34.2* 33.2  MCHC 30.2 31.1 31.3  RDW 16.0* 15.7* 15.4  PLT 255 174 197     BNP Recent Labs  Lab 03/13/23 0109  BNP 1,662.5*       Patient Profile     Monica Zamora is a 72 y.o. female with systolic heart failure, CAD status post CABG, pulmonary hypertension, persistent atrial fibrillation, hypertension, COPD, carotid artery disease, PAD, CKD admitted on 03/13/2023 for acute on chronic systolic heart failure and chest pain.  Echocardiogram June 2024 showed ejection fraction 40 to 45%, mild right ventricular enlargement, severe pulmonary hypertension, mild left atrial enlargement, moderate right atrial enlargement, moderate mitral regurgitation, moderate tricuspid regurgitation.  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  Also note of changes suggestive of cirrhosis.  Assessment &  Plan    1 acute combined systolic/diastolic congestive heart failure-volume status improved compared to admission.  Renal function also improved this morning.  Plan to proceed with right and left cardiac catheterization today as outlined previously.  The risk and benefits including myocardial infarction, CVA and death discussed and she agrees to proceed.  Will continue to hold Lasix and spironolactone today and resume tomorrow if renal function unchanged.  Continue low-dose hydralazine and nitrates.  Continue metoprolol but will transition to Toprol at discharge.  Will consider addition of SGLT2 inhibitor at discharge as well once renal function stable.  2 severe pulmonary hypertension-this is felt likely secondary to severe lung disease (she is on home oxygen for COPD).  Plan is right and left catheterization once renal function stable.  3 chest pain-troponins are not consistent with acute coronary syndrome.  Plan is to proceed with right and left cardiac catheterization today as outlined above.    4 coronary artery disease status post coronary bypass and graft-continue aspirin and statin.  5 persistent atrial fibrillation-Multaq has been discontinued in the setting of CHF.  Options for antiarrhythmic therapy are limited.  Renal function may not allow Tikosyn.  Amiodarone would not be a good choice in the setting of severe COPD.  Not a candidate for 1C agent in the setting of coronary disease.  Best option may be rate control and anticoagulation.  Continue heparin.  Will resume apixaban once all procedures complete.  6 acute kidney injury-creatinine improved this morning.  Will follow closely following catheterization.  No ventriculogram.  Limit dye.    7 peripheral vascular disease-continue statin.  No symptoms at present.  8 Carotid artery disease-patient will need follow-up carotid Dopplers June 2025.  For questions or updates, please contact [facility] Please consult [email or web address]  for contact info under        Signed, Kylar Speelman, MD  03/17/2023, 7:03 AM    

## 2023-03-17 NOTE — Progress Notes (Signed)
   Heart Failure Stewardship Pharmacist Progress Note   PCP: Rebecka Apley, NP PCP-Cardiologist: Olga Millers, MD    HPI:  72 yo F with PMH of CHF, afib, HTN, chronic hypoxic respiratory failure, CAD s/p CABG, and carotid artery disease.  Was seen in cardiology on 5/14 and complained of worsening dyspnea on exertion. Noted increased LE edema. ECHO 6/19 showed LVEF reduced to 40-45%, global hypokinesis, RV mildly reduced, moderate MR.  Followed up with cardiology on 6/27. HR was elevated to 90-100s during visit. She was not taking her metoprolol as prescribed. Advised to start taking it twice daily. Son was going to prepare med box for her.   She presented to the ED on 6/28 with new onset chest pain and epigastric pain associated with exertional dyspnea. In the ED, patient was hypotensive with SBP in the 70s and bradycardic with rates 30-40s. BB was reversed with glucagon with improvement in BP and HR. BNP was also elevated and noted to have bilateral LE edema and JVD. CXR without active cardiopulmonary disease. CTA without aneurysm or dissection, no PE. Notable for morphologic changes with pulmonary arterial hypertension and right heart failure.  R/LHC scheduled for 7/2.  Current HF Medications: Beta Blocker: metoprolol 25 mg BID Other: hydralazine 25 mg TID + Imdur 30 mg daily  Prior to admission HF Medications: Diuretic: furosemide 60 mg daily Beta blocker: metoprolol tartrate 12.5 mg BID  Pertinent Lab Values: Serum creatinine 1.24, BUN 24, Potassium 3.7, Sodium 137, Magnesium 1.8, BNP 1662.5  Vital Signs: Weight: 177 lbs (admission weight: 179 lbs) Blood pressure: 120/80s  Heart rate: 80-100s  I/O: net +1L  Medication Assistance / Insurance Benefits Check: Does the patient have prescription insurance?  Yes Type of insurance plan: [place] Medicaid  Outpatient Pharmacy:  Prior to admission outpatient pharmacy: [facility] - offers delivery service Is the patient  willing to use [facility] TOC pharmacy at discharge? Yes Is the patient willing to transition their outpatient pharmacy to utilize a [facility] outpatient pharmacy?   No    Assessment: 1. Acute on chronic HFmrEF (LVEF 40-45%). R/LHC 7/2. NYHA class III symptoms. - Off IV lasix and spironolactone with AKI - now improved. Plan cath today. Strict I/Os and daily weights. Keep K>4 and Mg>2.  - Continue metoprolol tartrate 25 mg BID - Continue hydralazine 25 mg TID + Imdur 30 mg daily - Stopped dronaderone with HF exacerbation, would not resume on discharge   Plan: 1) Medication changes recommended at this time: - None pending cath today  2) Patient assistance: - Farxiga/Jardiance copay $0 - Entresto copay $0  3)  Education  - Patient has been educated on current HF medications and potential additions to HF medication regimen - Patient verbalizes understanding that over the next few months, these medication doses may change and more medications may be added to optimize HF regimen - Patient has been educated on basic disease state pathophysiology and goals of therapy   Sharen Hones, PharmD, BCPS Heart Failure Stewardship Pharmacist Phone [phone]

## 2023-03-17 NOTE — Progress Notes (Signed)
ANTICOAGULATION CONSULT NOTE  Pharmacy Consult for Eliquis >> IV heparin Indication: atrial fibrillation  No Known Allergies  Patient Measurements: Height: 5\' 6"  (167.6 cm) Weight: 80.4 kg (177 lb 4 oz) IBW/kg (Calculated) : 59.3 Heparin Dosing Weight: 76.2 kg  Vital Signs: Temp: 97.7 F (36.5 C) (07/02 1116) Temp Source: Oral (07/02 1116) BP: 109/56 (07/02 1646) Pulse Rate: 80 (07/02 1646)  Labs: Recent Labs    03/15/23 0128 03/15/23 1109 03/16/23 0123 03/17/23 0136 03/17/23 1537 03/17/23 1538 03/17/23 1541  HGB 9.9*  --   --   --  10.9* 10.5* 10.5*  HCT 31.6*  --   --   --  32.0* 31.0* 31.0*  PLT 197  --   --   --   --   --   --   APTT 84* 84* 75* 93*  --   --   --   HEPARINUNFRC >1.10* >1.10* 1.00* 0.68  --   --   --   CREATININE 1.70*  --  1.88* 1.24*  --   --   --      Estimated Creatinine Clearance: 44.5 mL/min (A) (by C-G formula based on SCr of 1.24 mg/dL (H)).   Assessment: 72 yo female presented with chest pain, epigastric pain, and emesis. Patient is on Eliquis 5mg  BID PTA for hx Afib. Eliquis was resumed on admission and last dose was 6/28 @ 0900. Pharmacy has been consulted to transition Eliquis to IV heparin with plans for cardiac cath.  Now s/p cath 7/2. Pharmacy consulted to resume heparin 2 hrs post-TR band removal (removed at 1830 per RN). Heparin previously therapeutic at 1250 units/hr. CBC stable (last plt 6/30). No bleed issues reported.   Goal of Therapy:  Heparin level 0.3-0.7 units/ml aPTT 66-102 seconds Monitor platelets by anticoagulation protocol: Yes   Plan:  Resume heparin drip at previous therapeutic rate 1250 units/hr at 2030 (2 hrs post-TR band removal) Check 8hr aPTT from resumption Monitor daily aPTT and heparin level until correlating, CBC, s/sx bleeding Transition back to Eliquis as appropriate once all procedures completed   Leia Alf, PharmD, BCPS Please check AMION for all [facility] contact numbers Clinical  Pharmacist 03/17/2023 4:50 PM

## 2023-03-17 NOTE — Progress Notes (Signed)
ANTICOAGULATION CONSULT NOTE  Pharmacy Consult for Eliquis >> IV heparin Indication: atrial fibrillation  No Known Allergies  Patient Measurements: Height: 5\' 6"  (167.6 cm) Weight: 80.4 kg (177 lb 4 oz) IBW/kg (Calculated) : 59.3 Heparin Dosing Weight: 76.2 kg  Vital Signs: Temp: 97.8 F (36.6 C) (07/02 0753) Temp Source: Oral (07/02 0753) BP: 128/80 (07/02 0753) Pulse Rate: 84 (07/02 0753)  Labs: Recent Labs    03/15/23 0128 03/15/23 1109 03/16/23 0123 03/17/23 0136  HGB 9.9*  --   --   --   HCT 31.6*  --   --   --   PLT 197  --   --   --   APTT 84* 84* 75* 93*  HEPARINUNFRC >1.10* >1.10* 1.00* 0.68  CREATININE 1.70*  --  1.88* 1.24*    Estimated Creatinine Clearance: 44.5 mL/min (A) (by C-G formula based on SCr of 1.24 mg/dL (H)).   Assessment: 72 yo female presented with chest pain, epigastric pain, and emesis. Patient is on Eliquis 5mg  BID PTA for hx Afib. Eliquis was resumed on admission and last dose was 6/28 @ 0900. Pharmacy has been consulted to transition Eliquis to IV heparin with plans for cardiac cath.  Heparin level 0.68 is correlating with aPTT 93 and are therapeutic on 1250 units/hr.  Planning heart cath 7/2.   Goal of Therapy:  Heparin level 0.3-0.7 units/ml aPTT 66-102 seconds Monitor platelets by anticoagulation protocol: Yes   Plan:  Continue heparin drip at 1250 units/hr Daily aPTT, heparin level, CBC Monitor for s/sx of bleeding F/U after cath    Thank you for involving pharmacy in this patient's care.  Alphia Moh, PharmD, BCPS, BCCP Clinical Pharmacist  Please check AMION for all [facility] Pharmacy phone numbers After 10:00 PM, call Main Pharmacy [phone]

## 2023-03-17 NOTE — Progress Notes (Signed)
PROGRESS NOTE  Monica Zamora UJW:[id number] DOB: [birth year] DOA: 03/13/2023 PCP: Rebecka Apley, NP  Brief History   Patient is a 72 year old female, extensive vasculopath without noted significant workup, with history of coronary artery disease status post CABG, hypertension and atrial fibrillation. Patient was admitted with lower sternal/epigastric pain.  Chest pain was said to be radiating to the neck. Associated nausea and vomiting.  Non rising troponin.  EKG revealed A-fib with nonspecific T wave changes.  Cardiology plans left and right heart catheterization when renal function permits.  AKI on CKD 3 is noted.  Bump in serum creatinine noted today.  Vascular surgery team has also been consulted to assist with workup for extensive vasculopathy.  Lactic acid on presentation was normal.   CTA chest/abdomen/pelvis revealed: 1. No evidence of thoracoabdominal aortic aneurysm or dissection. 2. No pulmonary embolism identified. 3. Extensive multi-vessel coronary artery calcification, status post coronary artery bypass grafting. 4. Morphologic changes in keeping with pulmonary arterial hypertension and right heart failure with retrograde opacification of the hepatic venous system. 5. Mosaic attenuation within the pulmonary parenchyma and bronchial wall thickening in keeping with airway inflammation and multifocal air trapping. 6. 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 Clinical correlation for signs and symptoms of chronic mesenteric ischemia may be helpful. 7. Diffusely diminutive renal arteries bilaterally. Superimposed focal hemodynamically significant stenosis of the right renal artery origin. 8. Peripheral vascular disease with hemodynamically significant stenoses of the common iliac artery origins bilaterally and diffusely diseased lower extremity arterial inflow vasculature. 9. Morphologic changes suggesting  cirrhosis. This could be confirmed with hepatic elastography or tissue sampling. Mild ascites.   Aortic Atherosclerosis     03/14/2023: Patient seen.  No chest pain reported.  Cardiology input is highly appreciated.  For likely cardiac catheterization on Monday (2 days time), query right and left heart catheterization depending on renal function.  Patient is also on IV Lasix as there are concerns for possible cardiorenal component to the acute kidney injury.   03/15/2023: Patient seen.  No new complaints.  Improvement in renal function is noted.  Serum creatinine has improved from 2.15-1.7.  Potassium of 3.4 noted., will replete.   03/16/2023: Bump in serum creatinine noted.  Right and left cardiac catheterization has been postponed.  Monitor renal function tomorrow.  Possible cardiac catheterization tomorrow.  Vascular surgery team (Dr. Gillis Santa) has been consulted to assist with further workup and management of extensive vasculopathy.  03/17/2023: Creatinine has decreased from 1.88 to 1.24. Cardiology wishes to proceed with LHC later today. Her lasix and spironolactone have been held. She will be continued on the low dose hydralazine and nitrates. Plan is to transition metoprolol to toprol at discharge. Plan for AF is rate control and anticoagulation. The patient is currently receiving heparin. Apixaban will resume after procedures are completed. She will be continued on statin.   Objective   Vitals:  Vitals:   03/17/23 1715 03/17/23 1730  BP: (!) 119/53 (!) 122/91  Pulse:    Resp: 17 20  Temp:    SpO2:      Exam:  Constitutional:  The patient is awake, alert, and oriented x 3. No acute distress. Respiratory:  No increased work of breathing. No wheezes, rales, or rhonchi No tactile fremitus Cardiovascular:  Regular rate and rhythm No murmurs, ectopy, or gallups. No lateral PMI. No thrills. Abdomen:  Abdomen is soft, non-tender, non-distended No hernias, masses, or  organomegaly Normoactive bowel sounds.  Musculoskeletal:  No cyanosis, clubbing, or edema Skin:  No rashes, lesions, ulcers palpation of skin: no induration or nodules Neurologic:  CN 2-12 intact Sensation all 4 extremities intact Psychiatric:  Mental status Mood, affect appropriate Orientation to person, place, time  judgment and insight appear intact   I have personally reviewed the following:    Scheduled Meds:  aspirin EC  81 mg Oral Daily   atorvastatin  40 mg Oral Daily   dapagliflozin propanediol  10 mg Oral Daily   furosemide  60 mg Intravenous BID   hydrALAZINE  25 mg Oral Q8H   isosorbide mononitrate  30 mg Oral Daily   metoprolol tartrate  25 mg Oral BID   sertraline  100 mg Oral Daily   sodium chloride flush  3 mL Intravenous Q12H   sodium chloride flush  3 mL Intravenous Q12H   spironolactone  12.5 mg Oral Daily   Continuous Infusions:  sodium chloride 50 mL/hr at 03/17/23 1742   sodium chloride     sodium chloride Stopped (03/13/23 0305)    Principal Problem:   Acute on chronic systolic (congestive) heart failure (HCC)  A & P  Principal Problem:   Acute on chronic systolic (congestive) heart failure (HCC)   Acute on chronic HFrEF 40-45% -Last 2D echo done on 03/04/2023 revealed LVEF 40-45%, left ventricle global hypokinesis -Patient presented with volume overload on exam, right JVD, bilateral lower extremity pitting edema, BNP greater than 1600 -IV Lasix.   -Start strict I's and O's and daily weight -Plan is for LHC later today. Monitor creatinine.    Chest pain:  Elevated troponin: Troponin flat, 39, repeat 40 For possible left and right heart catheterization tomorrow. Continue to monitor renal function.   Left and right cardiac catheterization later today.   AKI on CKD 3A: -Baseline creatinine appears to be 1.1 with GFR 51 -Presented with creatinine of 1.64 with GFR of 33 -AKI is likely multifactorial, including, but not limited to  ATN/cardiorenal syndrome/exposure to contrast. -Aggressive diuresis. -Avoid hypotension. -Avoid nephrotoxic medications. -Keep MAP greater than 65 mmHg. -Strict I's and O's. -Dose all medications assuming GFR of less than 50 mL/min. -Serum creatinine improved from 1.88 to 1.21 today. -Lasix has been held   Elevated liver chemistries, unclear etiology, suspect related to alcohol use -Initially elevated AST, but back to normal. -Continue to monitor for now -Avoid hepatotoxic agents -Patient has a history of heavy alcohol use. She states that she has not had a drink for 2 months.   Permanent atrial fibrillation, currently rate controlled, on Eliquis Initially presented hypotensive and bradycardic, home beta-blocker was reversed with glucagon Resume home Eliquis Closely monitor on telemetry Rate control with metoprolol. Anticipate transition to toprol for discharge.   Chronic anxiety/depression Resume home Zoloft   Chronic hypoxic respiratory failure on 2 L nasal cannula continuously Appears to be at her baseline oxygen requirement Maintain O2 saturation above 90%   Extensive vascular disease: -Vascular surgery team has been consulted. -Abdominal pain on presentation, but with normal lactic acid.   DVT prophylaxis: Heparin drip. Code Status: Full code. Family Communication:  Disposition Plan: This will depend on hospital course.     Consultants:  Cardiology.   Procedures:  Cardiac catheterization is planned.   Antimicrobials:  None.  Darielys Giglia, DO [facility] Direct contact: see [email or web address]  7PM-7AM contact night coverage as above 03/17/2023, 6:45 PM  LOS: 4 days     LOS: 4 days

## 2023-03-17 NOTE — Consult Note (Signed)
ADVANCED HEART FAILURE CONSULT NOTE  Referring Physician: No ref. provider found  Primary Care: Rebecka Apley, NP Primary Cardiologist: Dr. Jens Som  HPI: Monica Zamora is a 72 y.o. female with paroxysmal atrial fibrillation, hypertension, chronic hypoxemic respiratory failure on 2 L nasal cannula, CAD status post CABG in 2021, CAD, heart failure with reduced ejection fraction (EF 40%), CKDIIIa, hx of tobacco use admitted on 03/13/2023 with substernal chest pain with radiation to the epigastrium.  In the ER patient noted to be hypotensive with systolic blood pressure in the 70s and bradycardic to the 30s.  She was given glucagon to reverse home beta-blocker dose.  Workup at that time was remarkable for BNP greater than 1600, chest x-ray with pulmonary edema. CT scan on admission also notable for mesenteric & celiac artery stenosis. Today she underwent LHC/RHC; HF consulted due to severely elevated filling pressures.   Cardiac history dates back to 2021 when she underwent CABG at [facility] in [place]; also noticed to have paroxysmal atrial fibrillation on Eliquis and Multaq since that time.  Echocardiogram in 2022 with a EF of 50 to 55%.  Repeat echocardiogram in June 2024 with decline in EF to 40 to 45% and severely elevated PASP of 77. Over the past 1-2 weeks, patient reports having worsening dyspnea with an inability to walk more than 15-24ft before becoming severely SOB, chest and abdominal discomfort.    Past Medical History:  Diagnosis Date   A-fib [facility])    Carotid artery disease (HCC)    COPD (chronic obstructive pulmonary disease) (HCC)    Coronary artery disease    Former tobacco use    Heart failure with mildly reduced ejection fraction (HFmrEF) (HCC)    Hx of CABG    Hypertension    PUD (peptic ulcer disease)     Current Facility-Administered Medications  Medication Dose Route Frequency Provider Last Rate Last Admin   0.9 %  sodium chloride infusion   250 mL Intravenous PRN Dunn, Dayna N, PA-C 10 mL/hr at 03/17/23 0200 Rate Verify at 03/17/23 0200   0.9 %  sodium chloride infusion   Intravenous Continuous Dunn, Dayna N, PA-C 10 mL/hr at 03/16/23 0704 New Bag at 03/16/23 0704   0.9 %  sodium chloride infusion   Intravenous Continuous Marykay Lex, MD 10 mL/hr at 03/17/23 0509 Restarted at 03/17/23 0509   [MAR Hold] acetaminophen (TYLENOL) tablet 650 mg  650 mg Oral Q6H PRN Darlin Drop, DO   650 mg at 03/17/23 1229   [MAR Hold] ALPRAZolam (XANAX) tablet 0.25 mg  0.25 mg Oral QHS PRN Dow Adolph N, DO   0.25 mg at 03/16/23 2043   [facility] Hold] aspirin EC tablet 81 mg  81 mg Oral Daily Dunn, Dayna N, PA-C   81 mg at 03/15/23 0805   [MAR Hold] atorvastatin (LIPITOR) tablet 40 mg  40 mg Oral Daily Sande Rives, MD   40 mg at 03/17/23 0815   heparin ADULT infusion 100 units/mL (25000 units/229mL)  1,250 Units/hr Intravenous Continuous Bell, Lorin C, RPH 12.5 mL/hr at 03/17/23 1233 1,250 Units/hr at 03/17/23 1233   heparin sodium (porcine) injection    PRN Marykay Lex, MD   4,000 Units at 03/17/23 1539   [MAR Hold] hydrALAZINE (APRESOLINE) tablet 25 mg  25 mg Oral Q8H Berton Mount I, MD   25 mg at 03/17/23 0530   [MAR Hold] isosorbide mononitrate (IMDUR) 24 hr tablet 30 mg  30 mg Oral  Daily O'Neal, Ronnald Ramp, MD   30 mg at 03/17/23 0815   lidocaine (PF) (XYLOCAINE) 1 % injection    PRN Marykay Lex, MD   2 mL at 03/17/23 1518   [MAR Hold] melatonin tablet 5 mg  5 mg Oral QHS PRN Darlin Drop, DO       [MAR Hold] metoprolol tartrate (LOPRESSOR) tablet 25 mg  25 mg Oral BID Sande Rives, MD   25 mg at 03/17/23 0815   [MAR Hold] polyethylene glycol (MIRALAX / GLYCOLAX) packet 17 g  17 g Oral Daily PRN Darlin Drop, DO       [MAR Hold] prochlorperazine (COMPAZINE) injection 5 mg  5 mg Intravenous Q6H PRN Darlin Drop, DO       [MAR Hold] sertraline (ZOLOFT) tablet 100 mg  100 mg Oral Daily Dow Adolph N, DO    100 mg at 03/17/23 5409   [facility] Hold] sodium chloride 0.9 % bolus 500 mL  500 mL Intravenous Once Palumbo, April, MD   Held at 03/13/23 0305   [facility] Hold] sodium chloride flush (NS) 0.9 % injection 3 mL  3 mL Intravenous Q12H Dunn, Dayna N, PA-C   3 mL at 03/16/23 2043   sodium chloride flush (NS) 0.9 % injection 3 mL  3 mL Intravenous PRN Dunn, Dayna N, PA-C        No Known Allergies    Social History   Socioeconomic History   Marital status: Divorced    Spouse name: Not on file   Number of children: 2   Years of education: Not on file   Highest education level: 11th grade  Occupational History   Occupation: Retired  Tobacco Use   Smoking status: Former    Packs/day: 1.50    Years: 54.00    Additional pack years: 0.00    Total pack years: 81.00    Types: Cigarettes    Start date: 09/16/1967    Quit date: 08/05/2022    Years since quitting: 0.6   Smokeless tobacco: Never  Vaping Use   Vaping Use: Never used  Substance and Sexual Activity   Alcohol use: Not Currently   Drug use: Never   Sexual activity: Not on file  Other Topics Concern   Not on file  Social History Narrative   Not on file   Social Determinants of Health   Financial Resource Strain: Low Risk  (03/13/2023)   Overall Financial Resource Strain (CARDIA)    Difficulty of Paying Living Expenses: Not very hard  Food Insecurity: No Food Insecurity (03/13/2023)   Hunger Vital Sign    Worried About Running Out of Food in the Last Year: Never true    Ran Out of Food in the Last Year: Never true  Transportation Needs: No Transportation Needs (03/13/2023)   PRAPARE - Administrator, Civil Service (Medical): No    Lack of Transportation (Non-Medical): No  Physical Activity: Not on file  Stress: Not on file  Social Connections: Not on file  Intimate Partner Violence: Not on file      Family History  Problem Relation Age of Onset   Lupus Mother    Lung disease Neg Hx     PHYSICAL EXAM: Vitals:    03/17/23 1116 03/17/23 1506  BP: 109/73   Pulse: 69   Resp: 20   Temp: 97.7 F (36.5 C)   SpO2: 99% 94%   GENERAL: chronically ill appearing WF; dyspneic.  HEENT: Negative  for arcus senilis or xanthelasma. There is no scleral icterus.  The mucous membranes are pink and moist.   NECK: Supple, No masses. Normal carotid upstrokes without bruits. No masses or thyromegaly.    CHEST: There are no chest wall deformities. There is no chest wall tenderness. Respirations are unlabored.  Lungs- labored breathing; mild exp wheezing CARDIAC:  JVP: >10cm cm H2O         Normal S1, S2  Normal rate with irregular rhythm. No murmurs, rubs or gallops.  Pulses are 2+ and symmetrical in upper and lower extremities. 1+ edema.  ABDOMEN: Soft, non-tender, non-distended. There are no masses or hepatomegaly. There are normal bowel sounds.  EXTREMITIES: Warm and well perfused with no cyanosis, clubbing.  LYMPHATIC: No axillary or supraclavicular lymphadenopathy.  NEUROLOGIC: Patient is oriented x3 with no focal or lateralizing neurologic deficits.  PSYCH: Patients affect is appropriate, there is no evidence of anxiety or depression.  SKIN: Warm and dry; no lesions or wounds.   DATA REVIEW  ECG: 03/13/23: atrial fibrillation, no ischemic changes.    ECHO: 03/04/23: LVEF 40%, mildly reduced RV function, PASP 77 as per my personal interpretation  CATH: 03/17/23:  Prox RCA lesion is 100% stenosed.  Dist Cx lesion is 100% stenosed. -, RPDA lesion is 55% stenosed.   Prox LAD lesion is 60% stenosed.  Mid LAD lesion is 70% stenosed.   LIMA graft was visualized by angiography.   SVG-OM3 graft was visualized by angiography and is normal in caliber.  The graft exhibits no disease.   SVG-rPDA  graft was visualized by angiography and is large.   Hemodynamic findings consistent with moderate pulmonary hypertension.   There is no aortic valve stenosis.   Recommend to resume IV Heparin, at currently prescribed dose and  frequency on 03/17/2023.   2 Hr after TR Band off.   Severe combined CHF with severely elevated right-sided pressures: RAP mean 24 mmHg with large mild wave.; RV P-EDP 57/8-17 mmHg. PAP-mean 6/37-42 mmHg; PCWP 35 mmHg with V wave up to 43. Significant respiratory variation. LV P-EDP 118/16-22 mmHg, AO P-MAP 106/73-79 mmHg Ao sat 96, PA sat 54%. CO-CI (Fick) 4.51-2.35; PAPI 0.86 Significant native vessel CAD with 3 of 3 patent grafts: 100% proximal RCA CTO; 100% mLCx; Prox LAD 60% & mLAD ~70% (competetive flow from LIMA-LAD) Patent LIMA-LAD, SVG-PDA, SVG-OM    ASSESSMENT & PLAN:  Acute on chronic HFrEF exacerbation  - Ischemic cardiomyopathy w/ hx of CABG in 2021; LHC today with patent grafts and likely unchanged CAD. Symptoms of chest pain likely secondary to decompensated heart failure.  - Most recent TTE in 6/24 w/ LVEF 40-45% and mildly reduced RV function. Today RHC with severely elevated biventricular filling pressures and reduced PAPi. Will repeat limited TTE to assess for any acute changes in RV function although I suspect this is due to volume overload.  - Place PICC line for CVP monitoring; does not have significant peripheral edema on exam; RA out of proportion.  - Start lasix 60mg  IV BID, farxiga 10mg  and spironolactone 12.5mg  daily; expect small rise in sCr tomorrow.  - Can transition to ARB/ARNI after obtaining renal artery evaluation  2. AKI on CKD 3a - sCr improving - start farxiga 10mg   - see above  3. CAD s/p CABG - LHC today with patent grafts;  - see above - lipitor 40mg    4. Atrial fibrillation  - In atrial fibrillation on my exam  - continue metoprolol tartrate for now - restart [facility]  after cath.   5. Renal / mesenteric / celiac artery stenosis  - Ultrasound from 03/16/23 w/ severe mesenteric artery stenosis, occluded celiac artery and high grade stenosis of the IMA. Can consider intervention if she is having symptoms of mesenteric ischemia.  - ABIs also abnormal.   - CT scan w/ stenosis of right renal artery; obtain renal artery duplex prior to starting ARB/ARNI. May benefit from intervention if Bps are labile or stenosis is severe.   6. PH / COPD  - Severe COPD requiring home O2 with underlying Group II + III PH  - continue home O2, outpatient pulmonology appointments.    Jhoana Upham Advanced Heart Failure Mechanical Circulatory Support

## 2023-03-17 NOTE — Progress Notes (Signed)
PT Cancellation Note  Patient Details Name: Monica Zamora MRN: [id number] DOB: 1950/10/24   Cancelled Treatment:    Reason Eval/Treat Not Completed: (P) Patient declined, no reason specified (Pt states she is going for surgery soon and is too tired to mobilize. She requests that PT returns tomorrow.)   Johny Shock 03/17/2023, 8:48 AM

## 2023-03-17 NOTE — Interval H&P Note (Signed)
History and Physical Interval Note:  03/17/2023 2:31 PM  Jacklynn Bue  has presented today for surgery, with the diagnosis of DOE, fatigue, congestive heart failure.  The various methods of treatment have been discussed with the patient and family. After consideration of risks, benefits and other options for treatment, the patient has consented to  Procedure(s): RIGHT/LEFT HEART CATH AND CORONARY/GRAFT ANGIOGRAPHY (N/A)  PERCUTANEOUS CORONARY INTERVENTION   as a surgical intervention.  The patient's history has been reviewed, patient examined, no change in status, stable for surgery.  I have reviewed the patient's chart and labs.  Questions were answered to the patient's satisfaction.    Cath Lab Visit (complete for each Cath Lab visit)  Clinical Evaluation Leading to the Procedure:   ACS: No.  Non-ACS:    Anginal Classification: CCS IINHYA CLASS 2-3 CHF  Anti-ischemic medical therapy: Maximal Therapy (2 or more classes of medications)  Non-Invasive Test Results: No non-invasive testing performed  Prior CABG: Previous CABG     Bryan Lemma

## 2023-03-17 NOTE — H&P (View-Only) (Signed)
Rounding Note    Patient Name: Monica Zamora Date of Encounter: 03/17/2023  [facility] Cardiologist: Olga Millers, MD   Subjective   Pt denies CP or dyspnea  Inpatient Medications    Scheduled Meds:  aspirin EC  81 mg Oral Daily   atorvastatin  40 mg Oral Daily   hydrALAZINE  25 mg Oral Q8H   isosorbide mononitrate  30 mg Oral Daily   metoprolol tartrate  25 mg Oral BID   sertraline  100 mg Oral Daily   sodium chloride flush  3 mL Intravenous Q12H   Continuous Infusions:  sodium chloride 10 mL/hr at 03/17/23 0200   sodium chloride 10 mL/hr at 03/16/23 0704   sodium chloride 10 mL/hr at 03/17/23 0509   heparin 1,250 Units/hr (03/17/23 0527)   sodium chloride Stopped (03/13/23 0305)   PRN Meds: sodium chloride, acetaminophen, ALPRAZolam, melatonin, polyethylene glycol, prochlorperazine, sodium chloride flush   Vital Signs    Vitals:   03/16/23 2000 03/16/23 2336 03/17/23 0331 03/17/23 0527  BP:  (!) 125/94 127/79   Pulse:  92 83   Resp:  18 18   Temp:  97.6 F (36.4 C) 98.1 F (36.7 C)   TempSrc:  Oral Oral   SpO2: 94% 90% 94%   Weight:    80.4 kg  Height:    5\' 6"  (1.676 m)    Intake/Output Summary (Last 24 hours) at 03/17/2023 0703 Last data filed at 03/17/2023 0527 Gross per 24 hour  Intake 1741.73 ml  Output 250 ml  Net 1491.73 ml       03/17/2023    5:27 AM 03/16/2023    5:05 AM 03/15/2023    9:48 PM  Last 3 Weights  Weight (lbs) 177 lb 4 oz 176 lb 2.4 oz 181 lb  Weight (kg) 80.4 kg 79.9 kg 82.1 kg      Telemetry    Atrial fibrillation rate controlled - Personally Reviewed   Physical Exam   GEN: NAD Neck: supple Cardiac: irregular, no gallop Respiratory: Diminished BS throughout; no wheeze GI: Soft, NT/ND MS: No edema Neuro:  Grossly intact  Psych: Normal affect   Labs    High Sensitivity Troponin:   Recent Labs  Lab 03/13/23 0109 03/13/23 0258  TROPONINIHS 39* 40*      Chemistry Recent Labs  Lab  03/13/23 0109 03/14/23 0557 03/15/23 0128 03/16/23 0123 03/17/23 0136  NA 140 136 137 136 137  K 3.9 3.9 3.4* 3.7 3.7  CL 106 104 101 99 103  CO2 19* 25 25 26 27   GLUCOSE 113* 93 105* 110* 124*  BUN 21 33* 33* 30* 24*  CREATININE 1.65* 2.15* 1.70* 1.88* 1.24*  CALCIUM 8.9 8.3* 8.3* 8.0* 8.5*  MG  --  2.0 1.8  --   --   PROT 6.3* 5.7*  --   --   --   ALBUMIN 3.1* 2.9* 3.0* 3.1* 2.9*  AST 121* 41  --   --   --   ALT 42 37  --   --   --   ALKPHOS 175* 145*  --   --   --   BILITOT 1.2 0.7  --   --   --   GFRNONAA 33* 24* 32* 28* 47*  ANIONGAP 15 7 11 11 7      Lipids  Recent Labs  Lab 03/14/23 0557  CHOL 135  TRIG 86  HDL 36*  LDLCALC 82  CHOLHDL 3.8  Hematology Recent Labs  Lab 03/13/23 0109 03/14/23 0557 03/15/23 0128  WBC 6.6 3.9* 4.2  RBC 3.38* 2.98* 2.98*  HGB 11.4* 10.2* 9.9*  HCT 37.7 32.8* 31.6*  MCV 111.5* 110.1* 106.0*  MCH 33.7 34.2* 33.2  MCHC 30.2 31.1 31.3  RDW 16.0* 15.7* 15.4  PLT 255 174 197     BNP Recent Labs  Lab 03/13/23 0109  BNP 1,662.5*       Patient Profile     Monica Zamora is a 72 y.o. female with systolic heart failure, CAD status post CABG, pulmonary hypertension, persistent atrial fibrillation, hypertension, COPD, carotid artery disease, PAD, CKD admitted on 03/13/2023 for acute on chronic systolic heart failure and chest pain.  Echocardiogram June 2024 showed ejection fraction 40 to 45%, mild right ventricular enlargement, severe pulmonary hypertension, mild left atrial enlargement, moderate right atrial enlargement, moderate mitral regurgitation, moderate tricuspid regurgitation.  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  Also note of changes suggestive of cirrhosis.  Assessment &  Plan    1 acute combined systolic/diastolic congestive heart failure-volume status improved compared to admission.  Renal function also improved this morning.  Plan to proceed with right and left cardiac catheterization today as outlined previously.  The risk and benefits including myocardial infarction, CVA and death discussed and she agrees to proceed.  Will continue to hold Lasix and spironolactone today and resume tomorrow if renal function unchanged.  Continue low-dose hydralazine and nitrates.  Continue metoprolol but will transition to Toprol at discharge.  Will consider addition of SGLT2 inhibitor at discharge as well once renal function stable.  2 severe pulmonary hypertension-this is felt likely secondary to severe lung disease (she is on home oxygen for COPD).  Plan is right and left catheterization once renal function stable.  3 chest pain-troponins are not consistent with acute coronary syndrome.  Plan is to proceed with right and left cardiac catheterization today as outlined above.    4 coronary artery disease status post coronary bypass and graft-continue aspirin and statin.  5 persistent atrial fibrillation-Multaq has been discontinued in the setting of CHF.  Options for antiarrhythmic therapy are limited.  Renal function may not allow Tikosyn.  Amiodarone would not be a good choice in the setting of severe COPD.  Not a candidate for 1C agent in the setting of coronary disease.  Best option may be rate control and anticoagulation.  Continue heparin.  Will resume apixaban once all procedures complete.  6 acute kidney injury-creatinine improved this morning.  Will follow closely following catheterization.  No ventriculogram.  Limit dye.    7 peripheral vascular disease-continue statin.  No symptoms at present.  8 Carotid artery disease-patient will need follow-up carotid Dopplers June 2025.  For questions or updates, please contact [facility] Please consult [email or web address]  for contact info under        Signed, Olga Millers, MD  03/17/2023, 7:03 AM

## 2023-03-17 NOTE — Progress Notes (Signed)
Peripherally Inserted Central Catheter Placement  The IV Nurse has discussed with the patient and/or persons authorized to consent for the patient, the purpose of this procedure and the potential benefits and risks involved with this procedure.  The benefits include less needle sticks, lab draws from the catheter, and the patient may be discharged home with the catheter. Risks include, but not limited to, infection, bleeding, blood clot (thrombus formation), and puncture of an artery; nerve damage and irregular heartbeat and possibility to perform a PICC exchange if needed/ordered by physician.  Alternatives to this procedure were also discussed.  Bard Power PICC patient education guide, fact sheet on infection prevention and patient information card has been provided to patient /or left at bedside.    PICC Placement Documentation  PICC Triple Lumen 03/17/23 Right Basilic 39 cm 0 cm (Active)  Indication for Insertion or Continuance of Line Vasoactive infusions 03/17/23 1830  Exposed Catheter (cm) 0 cm 03/17/23 1830  Site Assessment Clean, Dry, Intact 03/17/23 1830  Lumen #1 Status Flushed;Saline locked;Blood return noted 03/17/23 1830  Lumen #2 Status Flushed;Saline locked;Blood return noted 03/17/23 1830  Lumen #3 Status Flushed;Saline locked;Blood return noted 03/17/23 1830  Dressing Type Transparent;Securing device 03/17/23 1830  Dressing Status Antimicrobial disc in place;Clean, Dry, Intact 03/17/23 1830  Safety Lock Not Applicable 03/17/23 1830  Line Care Connections checked and tightened 03/17/23 1830  Line Adjustment (NICU/IV Team Only) No 03/17/23 1830  Dressing Intervention New dressing;Other (Comment) 03/17/23 1830  Dressing Change Due 03/24/23 03/17/23 1830       Annett Fabian 03/17/2023, 6:31 PM

## 2023-03-18 ENCOUNTER — Encounter (HOSPITAL_COMMUNITY): Payer: Medicare HMO

## 2023-03-18 ENCOUNTER — Other Ambulatory Visit (HOSPITAL_COMMUNITY): Payer: Self-pay

## 2023-03-18 ENCOUNTER — Inpatient Hospital Stay (HOSPITAL_COMMUNITY): Payer: Medicare HMO

## 2023-03-18 ENCOUNTER — Encounter (HOSPITAL_COMMUNITY): Payer: Self-pay | Admitting: Cardiology

## 2023-03-18 DIAGNOSIS — I5023 Acute on chronic systolic (congestive) heart failure: Secondary | ICD-10-CM | POA: Diagnosis not present

## 2023-03-18 DIAGNOSIS — R0603 Acute respiratory distress: Secondary | ICD-10-CM | POA: Diagnosis not present

## 2023-03-18 LAB — CBC
HCT: 29.3 % — ABNORMAL LOW (ref 36.0–46.0)
Hemoglobin: 9.1 g/dL — ABNORMAL LOW (ref 12.0–15.0)
MCH: 34.2 pg — ABNORMAL HIGH (ref 26.0–34.0)
MCHC: 31.1 g/dL (ref 30.0–36.0)
MCV: 110.2 fL — ABNORMAL HIGH (ref 80.0–100.0)
Platelets: 153 10*3/uL (ref 150–400)
RBC: 2.66 MIL/uL — ABNORMAL LOW (ref 3.87–5.11)
RDW: 15.9 % — ABNORMAL HIGH (ref 11.5–15.5)
WBC: 3.3 10*3/uL — ABNORMAL LOW (ref 4.0–10.5)
nRBC: 0 % (ref 0.0–0.2)

## 2023-03-18 LAB — APTT: aPTT: 97 seconds — ABNORMAL HIGH (ref 24–36)

## 2023-03-18 LAB — BASIC METABOLIC PANEL
Anion gap: 10 (ref 5–15)
BUN: 22 mg/dL (ref 8–23)
CO2: 27 mmol/L (ref 22–32)
Calcium: 8.4 mg/dL — ABNORMAL LOW (ref 8.9–10.3)
Chloride: 101 mmol/L (ref 98–111)
Creatinine, Ser: 1.36 mg/dL — ABNORMAL HIGH (ref 0.44–1.00)
GFR, Estimated: 42 mL/min — ABNORMAL LOW (ref >60)
Glucose, Bld: 91 mg/dL (ref 70–99)
Potassium: 3.5 mmol/L (ref 3.5–5.1)
Sodium: 138 mmol/L (ref 135–145)

## 2023-03-18 LAB — ECHOCARDIOGRAM LIMITED
Height: 66 in
S' Lateral: 4.2 cm
Weight: 2809.6 oz

## 2023-03-18 LAB — HEPARIN LEVEL (UNFRACTIONATED): Heparin Unfractionated: 0.51 IU/mL (ref 0.30–0.70)

## 2023-03-18 MED ORDER — POTASSIUM CHLORIDE CRYS ER 20 MEQ PO TBCR
40.0000 meq | EXTENDED_RELEASE_TABLET | Freq: Two times a day (BID) | ORAL | Status: AC
  Administered 2023-03-18 (×2): 40 meq via ORAL
  Filled 2023-03-18 (×2): qty 2

## 2023-03-18 MED ORDER — METOPROLOL SUCCINATE ER 25 MG PO TB24: 25.0000 mg | ORAL_TABLET | Freq: Every day | ORAL | Status: DC

## 2023-03-18 MED ORDER — METOPROLOL SUCCINATE ER 50 MG PO TB24
50.0000 mg | ORAL_TABLET | Freq: Every day | ORAL | Status: DC
  Administered 2023-03-18: 50 mg via ORAL

## 2023-03-18 MED ORDER — FUROSEMIDE 10 MG/ML IJ SOLN
80.0000 mg | Freq: Two times a day (BID) | INTRAMUSCULAR | Status: DC
  Administered 2023-03-18 – 2023-03-21 (×6): 80 mg via INTRAVENOUS
  Filled 2023-03-18 (×6): qty 8

## 2023-03-18 MED ORDER — METOPROLOL SUCCINATE ER 50 MG PO TB24
50.0000 mg | ORAL_TABLET | Freq: Every day | ORAL | Status: DC
  Filled 2023-03-18: qty 1

## 2023-03-18 MED ORDER — METOLAZONE 5 MG PO TABS
2.5000 mg | ORAL_TABLET | Freq: Once | ORAL | Status: AC
  Administered 2023-03-18: 2.5 mg via ORAL
  Filled 2023-03-18: qty 1

## 2023-03-18 NOTE — Progress Notes (Signed)
Pt refused hydralizine. Primary MD notified.  Lawson Radar, RN

## 2023-03-18 NOTE — Progress Notes (Signed)
PROGRESS NOTE  Monica Zamora:[id number] DOB: 07/07/1951 DOA: 03/13/2023 PCP: Rebecka Apley, NP  Brief History   Patient is a 72 year old female, extensive vasculopath without noted significant workup, with history of coronary artery disease status post CABG, hypertension and atrial fibrillation. Patient was admitted with lower sternal/epigastric pain.  Chest pain was said to be radiating to the neck. Associated nausea and vomiting.  Non rising troponin.  EKG revealed A-fib with nonspecific T wave changes.  The patient underwent left and right heart catheterizations on 03/17/2023.  It demonstrated:   Severe combined CHF with severely elevated right-sided pressures: RAP mean 24 mmHg with large mild wave.; RV P-EDP 57/8-17 mmHg. PAP-mean 6/37-42 mmHg; PCWP 35 mmHg with V wave up to 43. Significant respiratory variation. LV P-EDP 118/16-22 mmHg, AO P-MAP 106/73-79 mmHg Ao sat 96, PA sat 54%. CO-CI (Fick) 4.51-2.35; PAPI 0.86 Significant native vessel CAD with 3 of 3 patent grafts: 100% proximal RCA CTO; 100% mLCx; Prox LAD 60% & mLAD ~70% (competetive flow from LIMA-LAD) Patent LIMA-LAD, SVG-PDA, SVG-OM 3 Cardiology's recommendations are for continued diuresis with IV lasix and heart failure service consult due to severe RV failure. She will be continued on IV heparin for now, but will be converted back to DOAC once invasive procedures are completed.   CTA chest/abdomen/pelvis revealed: 1. No evidence of thoracoabdominal aortic aneurysm or dissection. 2. No pulmonary embolism identified. 3. Extensive multi-vessel coronary artery calcification, status post coronary artery bypass grafting. 4. Morphologic changes in keeping with pulmonary arterial hypertension and right heart failure with retrograde opacification of the hepatic venous system. 5. Mosaic attenuation within the pulmonary parenchyma and bronchial wall thickening in keeping with airway inflammation and multifocal air  trapping. 6. 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 Clinical correlation for signs and symptoms of chronic mesenteric ischemia may be helpful. 7. Diffusely diminutive renal arteries bilaterally. Superimposed focal hemodynamically significant stenosis of the right renal artery origin. 8. Peripheral vascular disease with hemodynamically significant stenoses of the common iliac artery origins bilaterally and diffusely diseased lower extremity arterial inflow vasculature. 9. Morphologic changes suggesting cirrhosis. This could be confirmed with hepatic elastography or tissue sampling. Mild ascites.   Aortic Atherosclerosis     03/18/2023: Creatinine has modestly increased today to 1.36. Will continue to monitor renal status. Cardiology has requested a triple lumen catheter today to follow CVP. Chest pain is felt to be due to decompensated heart failure. Her lasix has been increased to 80 mg IV bid. Recommendations include farxiga 10 mg daily, spironolactone 12.5 daily, Imdur 30 mg daily, and hydralazine 25 mg tid. Consider entresto after Doppler of renal artery is concluded. Intervention may be considered if stenosis is severe or blood pressures are effected. Metoprolol has been changed from tartrate to 50 mg succinate . Heparin will be switched to eliquis once the procedures are complete.   Objective   Vitals:  Vitals:   03/18/23 1549 03/18/23 1729  BP: 129/74 119/66  Pulse: (!) 104 (!) 102  Resp: 20   Temp: 97.8 F (36.6 C) 97.8 F (36.6 C)  SpO2: 96% 97%    Exam:  Constitutional:  The patient is awake, alert, and oriented x 3. No acute distress. Respiratory:  No increased work of breathing. No wheezes, rales, or rhonchi No tactile fremitus Cardiovascular:  Regular rate and rhythm No murmurs, ectopy, or gallups. No lateral PMI. No thrills. Abdomen:  Abdomen is soft, non-tender, non-distended No hernias, masses, or  organomegaly  Normoactive bowel sounds.  Musculoskeletal:  No cyanosis, clubbing, or edema Skin:  No rashes, lesions, ulcers palpation of skin: no induration or nodules Neurologic:  CN 2-12 intact Sensation all 4 extremities intact Psychiatric:  Mental status Mood, affect appropriate Orientation to person, place, time  judgment and insight appear intact   I have personally reviewed the following:    Scheduled Meds:  aspirin EC  81 mg Oral Daily   atorvastatin  40 mg Oral Daily   Chlorhexidine Gluconate Cloth  6 each Topical Daily   dapagliflozin propanediol  10 mg Oral Daily   furosemide  80 mg Intravenous BID   hydrALAZINE  25 mg Oral Q8H   isosorbide mononitrate  30 mg Oral Daily   metoprolol succinate  50 mg Oral Daily   potassium chloride  40 mEq Oral BID   sertraline  100 mg Oral Daily   sodium chloride flush  10-40 mL Intracatheter Q12H   sodium chloride flush  3 mL Intravenous Q12H   sodium chloride flush  3 mL Intravenous Q12H   spironolactone  12.5 mg Oral Daily   Continuous Infusions:  sodium chloride     heparin 1,250 Units/hr (03/18/23 1702)   sodium chloride Stopped (03/13/23 0305)    Principal Problem:   Acute on chronic systolic (congestive) heart failure (HCC)  A & P  Principal Problem:   Acute on chronic systolic (congestive) heart failure (HCC)   Acute on chronic HFrEF 40-45% -Last 2D echo done on 03/04/2023 revealed LVEF 40-45%, left ventricle global hypokinesis -Patient presented with volume overload on exam, right JVD, bilateral lower extremity pitting edema, BNP greater than 1600 -IV Lasix.   -Start strict I's and O's and daily weight -LHC completed 7/2. Results above.   Chest pain:  Elevated troponin: Troponin flat, 39, repeat 40 For possible left and right heart catheterization tomorrow. Continue to monitor renal function.   -LHC completed 7/2. Results above.   AKI on CKD 3A: -Baseline creatinine appears to be 1.1 with GFR  51 -Presented with creatinine of 1.64 with GFR of 33 -AKI is likely multifactorial, including, but not limited to ATN/cardiorenal syndrome/exposure to contrast. -Aggressive diuresis. -Avoid hypotension. -Avoid nephrotoxic medications. -Keep MAP greater than 65 mmHg. -Strict I's and O's. -Dose all medications assuming GFR of less than 50 mL/min. -Serum creatinine improved from 1.88 to 1.36 today. -Lasix has been restarted at 890 mg IV bid.   Elevated liver chemistries, unclear etiology, suspect related to alcohol use -Initially elevated AST, but back to normal. -Continue to monitor for now -Avoid hepatotoxic agents -Patient has a history of heavy alcohol use. She states that she has not had a drink for 2 months.   Permanent atrial fibrillation, currently rate controlled, on Eliquis Initially presented hypotensive and bradycardic, home beta-blocker was reversed with glucagon Resume home Eliquis Closely monitor on telemetry Rate control with metoprolol. Anticipate transition to toprol for discharge.  Renal artery stenosis: On CTa nd ultrasound. Doppler of renal arteries pending. Consider entresto if appropriate.   Chronic anxiety/depression Resume home Zoloft   Chronic hypoxic respiratory failure on 2 L nasal cannula continuously Appears to be at her baseline oxygen requirement Maintain O2 saturation above 90%   Extensive vascular disease: -Vascular surgery team has been consulted. -Abdominal pain on presentation, but with normal lactic acid.   DVT prophylaxis: Heparin drip. Code Status: Full code. Family Communication:  Disposition Plan: This will depend on hospital course.     Consultants:  Cardiology.   Procedures:  Cardiac  catheterization    Antimicrobials:  None.  Larkyn Greenberger, DO [facility] Direct contact: see [email or web address]  7PM-7AM contact night coverage as above 03/18/2023, 7:24 PM  LOS: 4 days     LOS: 5 days

## 2023-03-18 NOTE — Progress Notes (Signed)
  Echocardiogram 2D Echocardiogram has been performed.  Monica Zamora 03/18/2023, 11:10 AM

## 2023-03-18 NOTE — Progress Notes (Addendum)
Heart Failure Navigator Progress Note  Assessed for Heart & Vascular TOC clinic readiness.  Patient does not meet criteria due to Advanced Heart Failure Team patient of Dr. Gasper Lloyd. Navigator will cancel patients original appointment with Hf TOC on 03/30/2023.    Navigator will sign off at this time.   Rhae Hammock, BSN, Scientist, clinical (histocompatibility and immunogenetics) Only

## 2023-03-18 NOTE — Progress Notes (Addendum)
Advanced Heart Failure Rounding Note  PCP-Cardiologist: Olga Millers, MD   Subjective:     Winded with minimal activity. + orthopnea. O2 stable on 2L [place] (baseline requirement).  CVP 14. Is/Os not accurate. On IV lasix 60 BID. She doesn't feel she is urinating much.   Objective:   Weight Range: 79.7 kg Body mass index is 28.34 kg/m.   Vital Signs:   Temp:  [97.4 F (36.3 C)-98 F (36.7 C)] 98 F (36.7 C) (07/03 0740) Pulse Rate:  [69-111] 96 (07/03 0740) Resp:  [17-31] 20 (07/03 0740) BP: (99-133)/(53-91) 121/85 (07/03 0740) SpO2:  [93 %-99 %] 96 % (07/03 0740) Weight:  [79.7 kg] 79.7 kg (07/03 0332) Last BM Date : 03/17/23  Weight change: Filed Weights   03/16/23 0505 03/17/23 0527 03/18/23 0332  Weight: 79.9 kg 80.4 kg 79.7 kg    Intake/Output:   Intake/Output Summary (Last 24 hours) at 03/18/2023 0850 Last data filed at 03/18/2023 0800 Gross per 24 hour  Intake 1384.03 ml  Output 200 ml  Net 1184.03 ml      Physical Exam    General:  Chronically ill appearing.  HEENT: Normal Neck: Supple. JVP 14+ . Carotids 2+ bilat; no bruits.  Cor: PMI nondisplaced. Irregular rhythm, tachy. No rubs, gallops or murmurs. Lungs: Diminished Abdomen: Soft, nontender, nondistended.  Extremities: No cyanosis, clubbing, rash, trace edema Neuro: Alert & orientedx3. Affect pleasant   Telemetry   Afib 90s-110s  Labs    CBC Recent Labs    03/17/23 1541 03/18/23 0430  WBC  --  3.3*  HGB 10.5* 9.1*  HCT 31.0* 29.3*  MCV  --  110.2*  PLT  --  153   Basic Metabolic Panel Recent Labs    16/10/96 0123 03/17/23 0136 03/17/23 1537 03/17/23 1541 03/18/23 0430  NA 136 137   < > 138 138  K 3.7 3.7   < > 4.5 3.5  CL 99 103  --   --  101  CO2 26 27  --   --  27  GLUCOSE 110* 124*  --   --  91  BUN 30* 24*  --   --  22  CREATININE 1.88* 1.24*  --   --  1.36*  CALCIUM 8.0* 8.5*  --   --  8.4*  PHOS 3.4 2.9  --   --   --    < > = values in this interval not  displayed.   Liver Function Tests Recent Labs    03/16/23 0123 03/17/23 0136  ALBUMIN 3.1* 2.9*   No results for input(s): "LIPASE", "AMYLASE" in the last 72 hours. Cardiac Enzymes No results for input(s): "CKTOTAL", "CKMB", "CKMBINDEX", "TROPONINI" in the last 72 hours.  BNP: BNP (last 3 results) Recent Labs    03/13/23 0109  BNP 1,662.5*    ProBNP (last 3 results) No results for input(s): "PROBNP" in the last 8760 hours.   D-Dimer No results for input(s): "DDIMER" in the last 72 hours. Hemoglobin A1C No results for input(s): "HGBA1C" in the last 72 hours. Fasting Lipid Panel No results for input(s): "CHOL", "HDL", "LDLCALC", "TRIG", "CHOLHDL", "LDLDIRECT" in the last 72 hours. Thyroid Function Tests No results for input(s): "TSH", "T4TOTAL", "T3FREE", "THYROIDAB" in the last 72 hours.  Invalid input(s): "FREET3"  Other results:   Imaging    DG CHEST PORT 1 VIEW  Result Date: 03/17/2023 CLINICAL DATA:  PICC line placement EXAM: PORTABLE CHEST 1 VIEW COMPARISON:  03/13/2023 FINDINGS: Mild bibasilar opacities,  likely atelectasis. No pleural effusion or pneumothorax. Cardiomegaly. Postsurgical changes related to prior CABG. Thoracic aortic atherosclerosis. Right arm PICC terminating at the cavoatrial junction. Median sternotomy. IMPRESSION: Right arm PICC terminating at the cavoatrial junction. Electronically Signed   By: Charline Bills M.D.   On: 03/17/2023 19:04   [place] EKG SITE RITE  Result Date: 03/17/2023 If Site Rite image not attached, placement could not be confirmed due to current cardiac rhythm.  CARDIAC CATHETERIZATION  Result Date: 03/17/2023   Prox RCA lesion is 100% stenosed.  Dist Cx lesion is 100% stenosed. -, RPDA lesion is 55% stenosed.   Prox LAD lesion is 60% stenosed.  Mid LAD lesion is 70% stenosed.   LIMA graft was visualized by angiography.   SVG-OM3 graft was visualized by angiography and is normal in caliber.  The graft exhibits no disease.    SVG-rPDA  graft was visualized by angiography and is large.  The graft exhibits no disease.   Hemodynamic findings consistent with moderate pulmonary hypertension with Severe RV Failure.   There is no aortic valve stenosis. POST-CATH FINDINGS Severe combined CHF with severely elevated right-sided pressures: RAP mean 24 mmHg with large mild wave.; RV P-EDP 57/8-17 mmHg. PAP-mean 6/37-42 mmHg; PCWP 35 mmHg with V wave up to 43. Significant respiratory variation. LV P-EDP 118/16-22 mmHg, AO P-MAP 106/73-79 mmHg Ao sat 96, PA sat 54%. CO-CI (Fick) 4.51-2.35; PAPI 0.86 Significant native vessel CAD with 3 of 3 patent grafts: 100% proximal RCA CTO; 100% mLCx; Prox LAD 60% & mLAD ~70% (competetive flow from LIMA-LAD) Patent LIMA-LAD, SVG-PDA, SVG-OM 3 RECOMMENDATIONS: Return patient to nursing unit for ongoing care with IV diuresis => Heart Failure Service Consulted based on Severe RV Failure She was given 80 mg IV Lasix in the Cath Lab With the potential for having other invasive procedures including PICC line, I have written to restart IV heparin 2 hours after sheath removal.  Would convert back to DOAC once invasive procedures are completed. Of note, she was in A-fib RVR with rates in the 100 120 bpm range. Bryan Lemma, MD    Medications:     Scheduled Medications:  aspirin EC  81 mg Oral Daily   atorvastatin  40 mg Oral Daily   Chlorhexidine Gluconate Cloth  6 each Topical Daily   dapagliflozin propanediol  10 mg Oral Daily   furosemide  60 mg Intravenous BID   hydrALAZINE  25 mg Oral Q8H   isosorbide mononitrate  30 mg Oral Daily   metoprolol tartrate  25 mg Oral BID   sertraline  100 mg Oral Daily   sodium chloride flush  10-40 mL Intracatheter Q12H   sodium chloride flush  3 mL Intravenous Q12H   sodium chloride flush  3 mL Intravenous Q12H   spironolactone  12.5 mg Oral Daily    Infusions:  sodium chloride     heparin 1,250 Units/hr (03/18/23 0000)   sodium chloride Stopped (03/13/23  0305)    PRN Medications: sodium chloride, acetaminophen, ALPRAZolam, melatonin, ondansetron (ZOFRAN) IV, polyethylene glycol, prochlorperazine, sodium chloride flush, sodium chloride flush  Assessment/Plan   Acute on chronic HFrEF exacerbation  - Ischemic cardiomyopathy w/ hx of CABG in 2021; LHC 03/17/23 with patent grafts and likely unchanged CAD. Symptoms of chest pain likely secondary to decompensated heart failure.  - TTE in 6/24 w/ LVEF 40-45% and mildly reduced RV function.  - RHC 03/17/23 with severely elevated biventricular filling pressures and reduced PAPi. Limited TTE to assess for any acute  changes in RV function although suspect this is due to volume overload.  - Increase IV lasix to 80 BID. Needs strict Is/Os - Continue Farxiga 10 mg daily - Continue spironolactone 12.5 mg daily - Switch metoprolol tartrate to metoprolol succinate 50 mg daily - Continue hydralazine 25 TID + imdur 30 mg daily for now - Can transition to ARB/ARNI after obtaining renal artery evaluation. Ordered.   2. AKI on CKD 3a - sCr improved - continue farxiga 10  - see above   3. CAD s/p CABG - LHC today with patent grafts - see above - lipitor 40mg     4. Atrial fibrillation  - In atrial fibrillation this admission, looks like she was last known to be in SR at f/u 05/24 - multaq stopped d/t CHF - not a great candidate for amiodarone with lung disease - switch metoprolol tartrate to metoprolol succinate. Can use bisoprolol in future if needed d/t lung disease. - ? If would benefit from restoring SR prior to discharge. Will need to diurese first. Unable to lie flat. - Switch heparin to Eliquis once procedures complete.  5. Mitral regurgitation - Moderate on most recent echo 06/24 - Will need to follow   5. Renal / mesenteric / celiac artery stenosis  - Ultrasound from 03/16/23 w/ severe mesenteric artery stenosis, occluded celiac artery and high grade stenosis of the IMA. Can consider  intervention if she is having symptoms of mesenteric ischemia.  - ABIs also abnormal.  - CT scan w/ stenosis of right renal artery; ordered renal artery duplex. Review results prior to starting ARB/ARNI. May benefit from intervention if Bps are labile or stenosis is severe. BP currently stable.   6. PH / COPD  - Severe COPD requiring home O2 with underlying Group II + III PH  - continue home O2, outpatient pulmonology appointments.    7. Anemia - Hgb 11.4 on admit, trended down to 9.1  - Follow - MCV elevated but will check iron studies  Length of Stay: 5  FINCH, LINDSAY N, PA-C  03/18/2023, 8:50 AM  Advanced Heart Failure Team Pager [phone] (M-F; 7a - 5p)  Please contact [facility] for night-coverage after hours (5p -7a ) and weekends on [email or web address]   Patient seen with PA, agree with the above note.   She remains in atrial fibrillation with controlled rate, 90s-100s.  I/Os not recorded.  CVP 17 on my read today.   Denies dyspnea at rest.   General: NAD Neck: JVP 14-16 cm, no thyromegaly or thyroid nodule.  Lungs: Clear to auscultation bilaterally with normal respiratory effort. CV: Nondisplaced PMI.  Heart irregular S1/S2, no S3/S4, no murmur.  No peripheral edema.   Abdomen: Soft, nontender, no hepatosplenomegaly, no distention.  Skin: Intact without lesions or rashes.  Neurologic: Alert and oriented x 3.  Psych: Normal affect. Extremities: No clubbing or cyanosis.  HEENT: Normal.   1. Acute on chronic systolic CHF:  Echo today with EF 35-40%, moderately decreased RV systolic function.  RHC yesterday with biventricular elevated filling pressures, low PAPi but preserved CI.  Pulmonary venous hypertension.  Suspect ischemic cardiomyopathy + component of cor pulmonale from COPD.  She is volume overloaded with CVP 17, creatinine stable 1.3.  - Lasix 80 mg IV bid and will give a dose of metolazone 2.5 x 1.  - Transition from metoprolol tartrate to succinate as above.  -  Farxiga 10 daily - spironolactone 12.5 daily - hydralazine 25 tid + Imdur 30 for now.  She  has significant bilateral renal artery stenosis by prior CTA, will assess via renal artery dopplers prior to starting ARNI.  2. CKD stage 3: Creatinine stable at 1.3.  3. Atrial fibrillation: History of PAF.  AF this admission, was in NSR in 5/24.  Probably not a good amiodarone candidate with severe COPD on home oxygen.   - Would continue Toprol XL 50 daily.  - Continue anticoagulation (currently on heparin gtt, eventualy restart Eliquis).   - Once she is diuresed, arrange for TEE-guided DCCV (?Friday).  - May be a candidate for Tikosyn in future to maintain NSR.  4. CAD s/p CABG: Cath 7/2 with patent grafts.  No ACS. No chest pain.  - Continue statin.  5. COPD: Severe, on home oxygen.  6. Renal artery stenosis: By CTA.  Getting renal artery dopplers.  7. Mesenteric artery stenosis: No intestinal angina reported.   [name] 03/18/2023 2:42 PM

## 2023-03-18 NOTE — Progress Notes (Signed)
Occupational Therapy Treatment Patient Details Name: Monica Zamora MRN: [id number] DOB: 05-10-1951 Today's Date: 03/18/2023   History of present illness 72 yo female admitted 6/28 with chest pain, bradycardia, hypotension and acute on chronic CHF. PMHx: Afib on Eliquis, HTN, respiratory failure on 2L, CAD s/p CABG, HFrEF   OT comments  Pt progressing towards goals this session, needing set up - min guard A for ADLs, mod I for bed mobility and min guard for transfers with RW. Pt able to stand for grooming task at sink and complete seated therex at EOB. Pt VSS on 2L O2 throughout. Pt presenting with impairments listed below, will follow acutely. Continue to recommend HHOT at d/c.    Recommendations for follow up therapy are one component of a multi-disciplinary discharge planning process, led by the attending physician.  Recommendations may be updated based on patient status, additional functional criteria and insurance authorization.    Assistance Recommended at Discharge Intermittent Supervision/Assistance  Patient can return home with the following  A little help with walking and/or transfers;A little help with bathing/dressing/bathroom;Assistance with cooking/housework;Direct supervision/assist for medications management;Direct supervision/assist for financial management;Assist for transportation;Help with stairs or ramp for entrance   Equipment Recommendations  None recommended by OT (pt has all needed DME)    Recommendations for Other Services PT consult    Precautions / Restrictions Precautions Precautions: Fall;Other (comment) Precaution Comments: watch sats Restrictions Weight Bearing Restrictions: No       Mobility Bed Mobility Overal bed mobility: Modified Independent                  Transfers Overall transfer level: Needs assistance Equipment used: Rolling walker (2 wheels) Transfers: Sit to/from Stand Sit to Stand: Min guard                  Balance Overall balance assessment: Needs assistance Sitting-balance support: Bilateral upper extremity supported Sitting balance-Leahy Scale: Good     Standing balance support: Bilateral upper extremity supported, During functional activity, Reliant on assistive device for balance Standing balance-Leahy Scale: Poor Standing balance comment: RW for gait                           ADL either performed or assessed with clinical judgement   ADL Overall ADL's : Needs assistance/impaired     Grooming: Set up;Standing Grooming Details (indicate cue type and reason): standing for oral care                 Toilet Transfer: Min guard;Ambulation;Regular Toilet;Rolling walker (2 wheels)           Functional mobility during ADLs: Min guard      Extremity/Trunk Assessment Upper Extremity Assessment Upper Extremity Assessment: Generalized weakness   Lower Extremity Assessment Lower Extremity Assessment: Defer to PT evaluation        Vision   Vision Assessment?: No apparent visual deficits   Perception Perception Perception: Not tested   Praxis Praxis Praxis: Not tested    Cognition Arousal/Alertness: Awake/alert Behavior During Therapy: WFL for tasks assessed/performed Overall Cognitive Status: Within Functional Limits for tasks assessed                                          Exercises Other Exercises Other Exercises: seated marching x10 Other Exercises: seated leg kicks x10    Shoulder Instructions  General Comments VSS On 2L O2    Pertinent Vitals/ Pain       Pain Assessment Pain Assessment: No/denies pain  Home Living                                          Prior Functioning/Environment              Frequency  Min 1X/week        Progress Toward Goals  OT Goals(current goals can now be found in the care plan section)  Progress towards OT goals: Progressing toward goals  Acute  Rehab OT Goals Patient Stated Goal: none stated OT Goal Formulation: With patient Time For Goal Achievement: 03/28/23 Potential to Achieve Goals: Good ADL Goals Pt Will Perform Lower Body Dressing: with modified independence;sitting/lateral leans;sit to/from stand Pt Will Perform Tub/Shower Transfer: Shower transfer;shower seat;ambulating;rolling walker Additional ADL Goal #1: pt will verbalize x3 energy conservation strategies in prep for ADLs  Plan Discharge plan remains appropriate;Frequency remains appropriate    Co-evaluation                 AM-PAC OT "6 Clicks" Daily Activity     Outcome Measure   Help from another person eating meals?: A Little Help from another person taking care of personal grooming?: A Little Help from another person toileting, which includes using toliet, bedpan, or urinal?: A Little Help from another person bathing (including washing, rinsing, drying)?: A Lot Help from another person to put on and taking off regular upper body clothing?: A Little Help from another person to put on and taking off regular lower body clothing?: A Little 6 Click Score: 17    End of Session Equipment Utilized During Treatment: Rolling walker (2 wheels);Oxygen (2L)  OT Visit Diagnosis: Unsteadiness on feet (R26.81);Other abnormalities of gait and mobility (R26.89);Muscle weakness (generalized) (M62.81)   Activity Tolerance Patient tolerated treatment well   Patient Left in bed;with call bell/phone within reach;with family/visitor present   Nurse Communication Mobility status        Time: 3086-5784 OT Time Calculation (min): 33 min  Charges: OT General Charges $OT Visit: 1 Visit OT Treatments $Self Care/Home Management : 23-37 mins  Carver Fila, OTD, OTR/L SecureChat Preferred Acute Rehab [phone]   Carver Fila Koonce 03/18/2023, 4:06 PM

## 2023-03-18 NOTE — Progress Notes (Signed)
Physical Therapy Treatment Patient Details Name: Monica Zamora MRN: [id number] DOB: [birth year] Today's Date: 03/18/2023   History of Present Illness 72 yo female admitted 6/28 with chest pain, bradycardia, hypotension and acute on chronic CHF. PMHx: Afib on Eliquis, HTN, respiratory failure on 2L, CAD s/p CABG, HFrEF    PT Comments  Pt supine in bed on arrival this session.  She reports she has been having multiple bouts of diarrhea.  Upon standing noted to be soiled in fecal matter.  Assisted in pericare and performed short bout of gt training.  Pt continues to be on track to return home with support from her caretaker.  Will continue to follow during acute hospitalization.        Assistance Recommended at Discharge Intermittent Supervision/Assistance  If plan is discharge home, recommend the following:  Can travel by private vehicle    A little help with bathing/dressing/bathroom;Assistance with cooking/housework;Assist for transportation      Equipment Recommendations  BSC/3in1    Recommendations for Other Services       Precautions / Restrictions Precautions Precautions: Fall;Other (comment) Precaution Comments: watch sats Restrictions Weight Bearing Restrictions: No     Mobility  Bed Mobility Overal bed mobility: Modified Independent                  Transfers Overall transfer level: Needs assistance Equipment used: Rolling walker (2 wheels) Transfers: Sit to/from Stand Sit to Stand: Min guard           General transfer comment: Cues for hand placement to and from seated surface.  Pt able to stay standing with supervision for pericare.    Ambulation/Gait Ambulation/Gait assistance: Min guard Gait Distance (Feet): 24 Feet Assistive device: Rolling walker (2 wheels) Gait Pattern/deviations: Step-through pattern, Decreased stride length       General Gait Details: Cues for posture and RW position.  SPO2 92% on 3L .  Pt continues to be  limited in progress due to poor endurance.  NO LOB noted.  RW use for energy conservation and balance.   Stairs             Wheelchair Mobility     Tilt Bed    Modified Rankin (Stroke Patients Only)       Balance Overall balance assessment: Needs assistance Sitting-balance support: Bilateral upper extremity supported Sitting balance-Leahy Scale: Good       Standing balance-Leahy Scale: Poor Standing balance comment: RW for gait                            Cognition Arousal/Alertness: Awake/alert Behavior During Therapy: WFL for tasks assessed/performed Overall Cognitive Status: Within Functional Limits for tasks assessed                                          Exercises      General Comments        Pertinent Vitals/Pain Pain Assessment Pain Assessment: No/denies pain    Home Living                          Prior Function            PT Goals (current goals can now be found in the care plan section) Acute Rehab PT Goals Patient Stated Goal: return to vacuuming and walking more  Potential to Achieve Goals: Good Progress towards PT goals: Progressing toward goals    Frequency    Min 1X/week      PT Plan Current plan remains appropriate    Co-evaluation              AM-PAC PT "6 Clicks" Mobility   Outcome Measure  Help needed turning from your back to your side while in a flat bed without using bedrails?: A Little Help needed moving from lying on your back to sitting on the side of a flat bed without using bedrails?: A Little Help needed moving to and from a bed to a chair (including a wheelchair)?: A Little Help needed standing up from a chair using your arms (e.g., wheelchair or bedside chair)?: A Little Help needed to walk in hospital room?: A Little Help needed climbing 3-5 steps with a railing? : A Little 6 Click Score: 18    End of Session Equipment Utilized During Treatment: Gait  belt Activity Tolerance: Patient tolerated treatment well Patient left: with call bell/phone within reach;with bed alarm set;in bed;with family/visitor present Nurse Communication: Mobility status PT Visit Diagnosis: Other abnormalities of gait and mobility (R26.89);Difficulty in walking, not elsewhere classified (R26.2)     Time: 8295-6213 PT Time Calculation (min) (ACUTE ONLY): 23 min  Charges:    $Gait Training: 8-22 mins $Therapeutic Activity: 8-22 mins PT General Charges $$ ACUTE PT VISIT: 1 Visit                     Bonney Leitz , PTA Acute Rehabilitation Services Office [phone]    Florestine Avers 03/18/2023, 2:46 PM

## 2023-03-18 NOTE — Progress Notes (Signed)
ANTICOAGULATION CONSULT NOTE  Pharmacy Consult for Eliquis >> IV heparin Indication: atrial fibrillation  No Known Allergies  Patient Measurements: Height: 5\' 6"  (167.6 cm) Weight: 79.7 kg (175 lb 9.6 oz) IBW/kg (Calculated) : 59.3 Heparin Dosing Weight: 76.2 kg  Vital Signs: Temp: 97.6 F (36.4 C) (07/03 0332) Temp Source: Oral (07/03 0332) BP: 133/81 (07/03 0332) Pulse Rate: 84 (07/03 0332)  Labs: Recent Labs    03/16/23 0123 03/17/23 0136 03/17/23 1537 03/17/23 1538 03/17/23 1541 03/18/23 0430  HGB  --   --    < > 10.5* 10.5* 9.1*  HCT  --   --    < > 31.0* 31.0* 29.3*  PLT  --   --   --   --   --  153  APTT 75* 93*  --   --   --  97*  HEPARINUNFRC 1.00* 0.68  --   --   --  0.51  CREATININE 1.88* 1.24*  --   --   --  1.36*   < > = values in this interval not displayed.     Estimated Creatinine Clearance: 40.4 mL/min (A) (by C-G formula based on SCr of 1.36 mg/dL (H)).   Assessment: 72 yo female presented with chest pain, epigastric pain, and emesis. Patient is on Eliquis 5mg  BID PTA for hx Afib. Eliquis was resumed on admission and last dose was 6/28 @ 0900. Pharmacy has been consulted to transition Eliquis to IV heparin with plans for cardiac cath.  Now s/p cath 7/2. Pharmacy consulted to resume heparin 2 hrs post-TR band removal (removed at 1830 per RN). Heparin previously therapeutic at 1250 units/hr. CBC stable (last plt 6/30). No bleed issues reported.  7/3 AM update:  Heparin level therapeutic  DC aPTTs  Goal of Therapy:  Heparin level 0.3-0.7 units/ml aPTT 66-102 seconds Monitor platelets by anticoagulation protocol: Yes   Plan:  Cont heparin 1250 units/hr 1400 heparin level   Abran Duke, PharmD, BCPS Clinical Pharmacist Phone: [phone]

## 2023-03-19 ENCOUNTER — Inpatient Hospital Stay (HOSPITAL_COMMUNITY): Payer: Medicare HMO

## 2023-03-19 DIAGNOSIS — I701 Atherosclerosis of renal artery: Secondary | ICD-10-CM | POA: Diagnosis not present

## 2023-03-19 LAB — BASIC METABOLIC PANEL
Anion gap: 12 (ref 5–15)
BUN: 21 mg/dL (ref 8–23)
CO2: 30 mmol/L (ref 22–32)
Calcium: 9.4 mg/dL (ref 8.9–10.3)
Chloride: 98 mmol/L (ref 98–111)
Creatinine, Ser: 1.57 mg/dL — ABNORMAL HIGH (ref 0.44–1.00)
GFR, Estimated: 35 mL/min — ABNORMAL LOW (ref >60)
Glucose, Bld: 108 mg/dL — ABNORMAL HIGH (ref 70–99)
Potassium: 4.6 mmol/L (ref 3.5–5.1)
Sodium: 140 mmol/L (ref 135–145)

## 2023-03-19 LAB — CBC
HCT: 34.9 % — ABNORMAL LOW (ref 36.0–46.0)
Hemoglobin: 10.7 g/dL — ABNORMAL LOW (ref 12.0–15.0)
MCH: 32.9 pg (ref 26.0–34.0)
MCHC: 30.7 g/dL (ref 30.0–36.0)
MCV: 107.4 fL — ABNORMAL HIGH (ref 80.0–100.0)
Platelets: 198 10*3/uL (ref 150–400)
RBC: 3.25 MIL/uL — ABNORMAL LOW (ref 3.87–5.11)
RDW: 15.9 % — ABNORMAL HIGH (ref 11.5–15.5)
WBC: 4.9 10*3/uL (ref 4.0–10.5)
nRBC: 0 % (ref 0.0–0.2)

## 2023-03-19 LAB — IRON AND TIBC
Iron: 59 ug/dL (ref 28–170)
Saturation Ratios: 19 % (ref 10.4–31.8)
TIBC: 316 ug/dL (ref 250–450)
UIBC: 257 ug/dL

## 2023-03-19 LAB — MAGNESIUM: Magnesium: 1.8 mg/dL (ref 1.7–2.4)

## 2023-03-19 LAB — COOXEMETRY PANEL
Carboxyhemoglobin: 3.2 % — ABNORMAL HIGH (ref 0.5–1.5)
Carboxyhemoglobin: 2.2 % — ABNORMAL HIGH (ref 0.5–1.5)
Methemoglobin: 0.9 % (ref 0.0–1.5)
Methemoglobin: 0.8 % (ref 0.0–1.5)
O2 Saturation: 97.6 %
O2 Saturation: 57 %
Total hemoglobin: 10.5 g/dL — ABNORMAL LOW (ref 12.0–16.0)
Total hemoglobin: 10.6 g/dL — ABNORMAL LOW (ref 12.0–16.0)

## 2023-03-19 LAB — HEPARIN LEVEL (UNFRACTIONATED): Heparin Unfractionated: 0.36 IU/mL (ref 0.30–0.70)

## 2023-03-19 LAB — FERRITIN: Ferritin: 84 ng/mL (ref 11–307)

## 2023-03-19 MED ORDER — METOLAZONE 5 MG PO TABS
2.5000 mg | ORAL_TABLET | Freq: Once | ORAL | Status: AC
  Administered 2023-03-19: 2.5 mg via ORAL
  Filled 2023-03-19: qty 1

## 2023-03-19 MED ORDER — LOPERAMIDE HCL 2 MG PO CAPS
2.0000 mg | ORAL_CAPSULE | ORAL | Status: DC | PRN
  Administered 2023-03-19: 2 mg via ORAL
  Filled 2023-03-19: qty 1

## 2023-03-19 MED ORDER — METOPROLOL SUCCINATE ER 100 MG PO TB24
100.0000 mg | ORAL_TABLET | Freq: Every day | ORAL | Status: DC
  Administered 2023-03-19: 100 mg via ORAL
  Filled 2023-03-19: qty 1

## 2023-03-19 MED ORDER — AMIODARONE HCL IN DEXTROSE 360-4.14 MG/200ML-% IV SOLN
30.0000 mg/h | INTRAVENOUS | Status: DC
  Administered 2023-03-19 – 2023-03-20 (×2): 30 mg/h via INTRAVENOUS
  Filled 2023-03-19 (×2): qty 200

## 2023-03-19 MED ORDER — AMIODARONE HCL IN DEXTROSE 360-4.14 MG/200ML-% IV SOLN
60.0000 mg/h | INTRAVENOUS | Status: AC
  Administered 2023-03-19: 60 mg/h via INTRAVENOUS

## 2023-03-19 MED ORDER — AMIODARONE HCL IN DEXTROSE 360-4.14 MG/200ML-% IV SOLN
INTRAVENOUS | Status: AC
  Filled 2023-03-19: qty 200

## 2023-03-19 MED ORDER — METOPROLOL SUCCINATE ER 50 MG PO TB24: 50.0000 mg | ORAL_TABLET | Freq: Every day | ORAL | Status: DC

## 2023-03-19 NOTE — Progress Notes (Signed)
Renal artery duplex has been completed. Preliminary results can be found in CV Proc through chart review.   03/19/23 9:39 AM Olen Cordial RVT

## 2023-03-19 NOTE — Progress Notes (Signed)
DAILY PROGRESS NOTE   Patient Name: Monica Zamora Date of Encounter: 03/19/2023 Cardiologist: Olga Millers, MD  Chief Complaint   Shortness of breath  Patient Profile   72 yo female with acute on chronic systolic congestive heart failure and recurrent afib with RVR  Subjective   Afib rate remains uncontrolled in the 120-130's today. Was net negative 1.5L yesterday on 80 mg IV BID lasix and metolazone. Weight is down 2kg. CVP transduced at the bedside was 13 cmH20 today. CoOX apparently 97.6%, but difficult to believe that value - had been 54%, seem arterial.  Objective   Vitals:   03/18/23 1729 03/18/23 1921 03/18/23 2314 03/19/23 0410  BP: 119/66 99/67 122/64 (!) 133/55  Pulse: (!) 102 (!) 104 (!) 109 (!) 102  Resp:  20 19 18   Temp: 97.8 F (36.6 C) 97.8 F (36.6 C) 97.6 F (36.4 C) 98.3 F (36.8 C)  TempSrc: Oral Oral Oral Oral  SpO2: 97% 98% 96% 98%  Weight:    77.2 kg  Height:        Intake/Output Summary (Last 24 hours) at 03/19/2023 0814 Last data filed at 03/19/2023 0600 Gross per 24 hour  Intake 857.5 ml  Output 2454 ml  Net -1596.5 ml   Filed Weights   03/17/23 0527 03/18/23 0332 03/19/23 0410  Weight: 80.4 kg 79.7 kg 77.2 kg    Physical Exam   General appearance: alert, no distress, pale, and on oxygen Neck: JVD - 5 cm above sternal notch, no carotid bruit, and thyroid not enlarged, symmetric, no tenderness/mass/nodules Lungs: diminished breath sounds bibasilar Heart: irregularly irregular, tachycardic Abdomen: soft, non-tender; bowel sounds normal; no masses,  no organomegaly Extremities: extremities normal, atraumatic, no cyanosis or edema Pulses: 2+ and symmetric Skin: Skin color, texture, turgor normal. No rashes or lesions Neurologic: Grossly normal Psych: Pleasant  Inpatient Medications    Scheduled Meds:  aspirin EC  81 mg Oral Daily   atorvastatin  40 mg Oral Daily   Chlorhexidine Gluconate Cloth  6 each Topical Daily    dapagliflozin propanediol  10 mg Oral Daily   furosemide  80 mg Intravenous BID   hydrALAZINE  25 mg Oral Q8H   isosorbide mononitrate  30 mg Oral Daily   metoprolol succinate  50 mg Oral Daily   sertraline  100 mg Oral Daily   sodium chloride flush  10-40 mL Intracatheter Q12H   sodium chloride flush  3 mL Intravenous Q12H   sodium chloride flush  3 mL Intravenous Q12H   spironolactone  12.5 mg Oral Daily    Continuous Infusions:  sodium chloride     heparin 1,250 Units/hr (03/18/23 1702)   sodium chloride Stopped (03/13/23 0305)    PRN Meds: sodium chloride, acetaminophen, ALPRAZolam, melatonin, ondansetron (ZOFRAN) IV, polyethylene glycol, prochlorperazine, sodium chloride flush, sodium chloride flush   Labs   Results for orders placed or performed during the hospital encounter of 03/13/23 (from the past 48 hour(s))  POCT I-Stat EG7     Status: Abnormal   Collection Time: 03/17/23  3:37 PM  Result Value Ref Range   pH, Ven 7.316 7.25 - 7.43   pCO2, Ven 53.4 44 - 60 mmHg   pO2, Ven 32 32 - 45 mmHg   Bicarbonate 27.3 20.0 - 28.0 mmol/L   TCO2 29 22 - 32 mmol/L   O2 Saturation 54 %   Acid-Base Excess 0.0 0.0 - 2.0 mmol/L   Sodium 139 135 - 145 mmol/L   Potassium  4.4 3.5 - 5.1 mmol/L   Calcium, Ion 1.20 1.15 - 1.40 mmol/L   HCT 32.0 (L) 36.0 - 46.0 %   Hemoglobin 10.9 (L) 12.0 - 15.0 g/dL   Sample type VENOUS    Comment NOTIFIED PHYSICIAN   POCT I-Stat EG7     Status: Abnormal   Collection Time: 03/17/23  3:38 PM  Result Value Ref Range   pH, Ven 7.331 7.25 - 7.43   pCO2, Ven 53.6 44 - 60 mmHg   pO2, Ven 31 (LL) 32 - 45 mmHg   Bicarbonate 28.3 (H) 20.0 - 28.0 mmol/L   TCO2 30 22 - 32 mmol/L   O2 Saturation 54 %   Acid-Base Excess 2.0 0.0 - 2.0 mmol/L   Sodium 139 135 - 145 mmol/L   Potassium 4.4 3.5 - 5.1 mmol/L   Calcium, Ion 1.19 1.15 - 1.40 mmol/L   HCT 31.0 (L) 36.0 - 46.0 %   Hemoglobin 10.5 (L) 12.0 - 15.0 g/dL   Sample type VENOUS    Comment NOTIFIED  PHYSICIAN   I-STAT 7, (LYTES, BLD GAS, ICA, H+H)     Status: Abnormal   Collection Time: 03/17/23  3:41 PM  Result Value Ref Range   pH, Arterial 7.351 7.35 - 7.45   pCO2 arterial 46.5 32 - 48 mmHg   pO2, Arterial 87 83 - 108 mmHg   Bicarbonate 25.7 20.0 - 28.0 mmol/L   TCO2 27 22 - 32 mmol/L   O2 Saturation 96 %   Acid-Base Excess 0.0 0.0 - 2.0 mmol/L   Sodium 138 135 - 145 mmol/L   Potassium 4.5 3.5 - 5.1 mmol/L   Calcium, Ion 1.21 1.15 - 1.40 mmol/L   HCT 31.0 (L) 36.0 - 46.0 %   Hemoglobin 10.5 (L) 12.0 - 15.0 g/dL   Sample type ARTERIAL   Basic metabolic panel     Status: Abnormal   Collection Time: 03/18/23  4:30 AM  Result Value Ref Range   Sodium 138 135 - 145 mmol/L   Potassium 3.5 3.5 - 5.1 mmol/L   Chloride 101 98 - 111 mmol/L   CO2 27 22 - 32 mmol/L   Glucose, Bld 91 70 - 99 mg/dL    Comment: Glucose reference range applies only to samples taken after fasting for at least 8 hours.   BUN 22 8 - 23 mg/dL   Creatinine, Ser 8.29 (H) 0.44 - 1.00 mg/dL   Calcium 8.4 (L) 8.9 - 10.3 mg/dL   GFR, Estimated 42 (L) >60 mL/min    Comment: (NOTE) Calculated using the CKD-EPI Creatinine Equation (2021)    Anion gap 10 5 - 15    Comment: Performed at [facility], [address], [place], [place] [address]  Heparin level (unfractionated)     Status: None   Collection Time: 03/18/23  4:30 AM  Result Value Ref Range   Heparin Unfractionated 0.51 0.30 - 0.70 IU/mL    Comment: (NOTE) The clinical reportable range upper limit is being lowered to >1.10 to align with the FDA approved guidance for the current laboratory assay.  If heparin results are below expected values, and patient dosage has  been confirmed, suggest follow up testing of antithrombin III levels. Performed at [facility], [address], [place], [place] [address]   APTT     Status: Abnormal   Collection Time: 03/18/23  4:30 AM  Result Value Ref Range   aPTT 97 (H) 24 - 36 seconds  Comment:         IF BASELINE aPTT IS ELEVATED, SUGGEST PATIENT RISK ASSESSMENT BE USED TO DETERMINE APPROPRIATE ANTICOAGULANT THERAPY. Performed at [facility], [address], [place], [place] [address]   CBC     Status: Abnormal   Collection Time: 03/18/23  4:30 AM  Result Value Ref Range   WBC 3.3 (L) 4.0 - 10.5 K/uL   RBC 2.66 (L) 3.87 - 5.11 MIL/uL   Hemoglobin 9.1 (L) 12.0 - 15.0 g/dL   HCT 60.4 (L) 54.0 - 98.1 %   MCV 110.2 (H) 80.0 - 100.0 fL   MCH 34.2 (H) 26.0 - 34.0 pg   MCHC 31.1 30.0 - 36.0 g/dL   RDW 19.1 (H) 47.8 - 29.5 %   Platelets 153 150 - 400 K/uL   nRBC 0.0 0.0 - 0.2 %    Comment: Performed at [facility], [address] [place], [place] [address]  CBC     Status: Abnormal   Collection Time: 03/19/23  4:32 AM  Result Value Ref Range   WBC 4.9 4.0 - 10.5 K/uL   RBC 3.25 (L) 3.87 - 5.11 MIL/uL   Hemoglobin 10.7 (L) 12.0 - 15.0 g/dL   HCT 86.5 (L) 78.4 - 69.6 %   MCV 107.4 (H) 80.0 - 100.0 fL   MCH 32.9 26.0 - 34.0 pg   MCHC 30.7 30.0 - 36.0 g/dL   RDW 29.5 (H) 28.4 - 13.2 %   Platelets 198 150 - 400 K/uL   nRBC 0.0 0.0 - 0.2 %    Comment: Performed at [facility], [address], [place], [place] [address]  Magnesium     Status: None   Collection Time: 03/19/23  4:32 AM  Result Value Ref Range   Magnesium 1.8 1.7 - 2.4 mg/dL    Comment: Performed at [facility], [address] [place], [place] [address]  Basic metabolic panel     Status: Abnormal   Collection Time: 03/19/23  4:32 AM  Result Value Ref Range   Sodium 140 135 - 145 mmol/L   Potassium 4.6 3.5 - 5.1 mmol/L   Chloride 98 98 - 111 mmol/L   CO2 30 22 - 32 mmol/L   Glucose, Bld 108 (H) 70 - 99 mg/dL    Comment: Glucose reference range applies only to samples taken after fasting for at least 8 hours.   BUN 21 8 - 23 mg/dL   Creatinine, Ser 6.64 (H) 0.44 - 1.00 mg/dL   Calcium 9.4 8.9 - 40.3 mg/dL   GFR, Estimated 35 (L) >60 mL/min    Comment: (NOTE) Calculated using  the CKD-EPI Creatinine Equation (2021)    Anion gap 12 5 - 15    Comment: Performed at [facility], [address] [place], [place] [address]  Heparin level (unfractionated)     Status: None   Collection Time: 03/19/23  5:15 AM  Result Value Ref Range   Heparin Unfractionated 0.36 0.30 - 0.70 IU/mL    Comment: (NOTE) The clinical reportable range upper limit is being lowered to >1.10 to align with the FDA approved guidance for the current laboratory assay.  If heparin results are below expected values, and patient dosage has  been confirmed, suggest follow up testing of antithrombin III levels. Performed at [facility], [address] [place], [place] [address]   Cooxemetry Panel (carboxy, met, total hgb, O2 sat)  Status: Abnormal   Collection Time: 03/19/23  5:15 AM  Result Value Ref Range   Total hemoglobin 10.5 (L) 12.0 - 16.0 g/dL   O2 Saturation 16.1 %   Carboxyhemoglobin 3.2 (H) 0.5 - 1.5 %   Methemoglobin 0.9 0.0 - 1.5 %    Comment: Performed at [facility], [address] [place], [place] [address]  Iron and TIBC     Status: None   Collection Time: 03/19/23  5:47 AM  Result Value Ref Range   Iron 59 28 - 170 ug/dL   TIBC 540 981 - 191 ug/dL   Saturation Ratios 19 10.4 - 31.8 %   UIBC 257 ug/dL    Comment: Performed at [facility], [address], [place], [place] [address]  Ferritin     Status: None   Collection Time: 03/19/23  5:47 AM  Result Value Ref Range   Ferritin 84 11 - 307 ng/mL    Comment: Performed at [facility], [address] [place], [place] [address]    ECG   N/A  Telemetry   Afib with RVR - Personally Reviewed  Radiology    ECHOCARDIOGRAM LIMITED  Result Date: 03/18/2023    ECHOCARDIOGRAM LIMITED REPORT   Patient Name:   Monica Zamora Date of Exam: 03/18/2023 Medical Rec #:  [id number]           Height:       66.0 in Accession #:    [id number]          Weight:       175.6 lb Date of Birth:   December 19, 1950           BSA:          1.892 m Patient Age:    72 years            BP:           121/85 mmHg Patient Gender: F                   HR:           93 bpm. Exam Location:  Inpatient Procedure: Limited Echo, Cardiac Doppler and Limited Color Doppler Indications:     Acute respiratory distress  History:         Patient has prior history of Echocardiogram examinations, most                  recent 03/04/2023. HFmrEF, CAD, Prior CABG, COPD and Carotid                  Disease, Arrythmias:Atrial Fibrillation; Risk                  Factors:Hypertension and Former Smoker.  Sonographer:     Milda Smart Referring Phys:  [id number] Dorthula Nettles Diagnosing Phys: Epifanio Lesches MD IMPRESSIONS  1. Left ventricular ejection fraction, by estimation, is 35 to 40%. The left ventricle has moderately decreased function. The left ventricle demonstrates global hypokinesis. There is mild left ventricular hypertrophy.  2. Right ventricular systolic function is moderately reduced. The right ventricular size is normal. There is moderately elevated pulmonary artery systolic pressure. The estimated right ventricular systolic pressure is 52.2 mmHg.  3. The mitral valve is normal in structure. Mild mitral valve regurgitation.  4. Tricuspid valve regurgitation is moderate.  5. The aortic valve is tricuspid. Aortic valve regurgitation is not visualized. Aortic valve sclerosis is present, with no evidence  of aortic valve stenosis.  6. The inferior vena cava is dilated in size with <50% respiratory variability, suggesting right atrial pressure of 15 mmHg. FINDINGS  Left Ventricle: Left ventricular ejection fraction, by estimation, is 35 to 40%. The left ventricle has moderately decreased function. The left ventricle demonstrates global hypokinesis. The left ventricular internal cavity size was normal in size. There is mild left ventricular hypertrophy. Right Ventricle: The right ventricular size is normal. Right ventricular  systolic function is moderately reduced. There is moderately elevated pulmonary artery systolic pressure. The tricuspid regurgitant velocity is 3.05 m/s, and with an assumed right atrial pressure of 15 mmHg, the estimated right ventricular systolic pressure is 52.2 mmHg. Pericardium: There is no evidence of pericardial effusion. Mitral Valve: The mitral valve is normal in structure. Mild mitral valve regurgitation. Tricuspid Valve: The tricuspid valve is normal in structure. Tricuspid valve regurgitation is moderate. Aortic Valve: The aortic valve is tricuspid. Aortic valve regurgitation is not visualized. Aortic valve sclerosis is present, with no evidence of aortic valve stenosis. Pulmonic Valve: The pulmonic valve was not well visualized. Pulmonic valve regurgitation is trivial. Venous: The inferior vena cava is dilated in size with less than 50% respiratory variability, suggesting right atrial pressure of 15 mmHg. IAS/Shunts: No atrial level shunt detected by color flow Doppler. Additional Comments: Spectral Doppler performed. Color Doppler performed.  LEFT VENTRICLE PLAX 2D LVIDd:         5.10 cm LVIDs:         4.20 cm LV PW:         1.20 cm LV IVS:        1.20 cm  RIGHT VENTRICLE RV Basal diam:  3.30 cm RV Mid diam:    2.60 cm TAPSE (M-mode): 0.6 cm LEFT ATRIUM         Index LA diam:    4.00 cm 2.11 cm/m  TRICUSPID VALVE TR Peak grad:   37.2 mmHg TR Vmax:        305.00 cm/s Epifanio Lesches MD Electronically signed by Epifanio Lesches MD Signature Date/Time: 03/18/2023/11:52:55 AM    Final (Updated)    DG CHEST PORT 1 VIEW  Result Date: 03/17/2023 CLINICAL DATA:  PICC line placement EXAM: PORTABLE CHEST 1 VIEW COMPARISON:  03/13/2023 FINDINGS: Mild bibasilar opacities, likely atelectasis. No pleural effusion or pneumothorax. Cardiomegaly. Postsurgical changes related to prior CABG. Thoracic aortic atherosclerosis. Right arm PICC terminating at the cavoatrial junction. Median sternotomy. IMPRESSION:  Right arm PICC terminating at the cavoatrial junction. Electronically Signed   By: Charline Bills M.D.   On: 03/17/2023 19:04   [place] EKG SITE RITE  Result Date: 03/17/2023 If Site Rite image not attached, placement could not be confirmed due to current cardiac rhythm.  CARDIAC CATHETERIZATION  Result Date: 03/17/2023   Prox RCA lesion is 100% stenosed.  Dist Cx lesion is 100% stenosed. -, RPDA lesion is 55% stenosed.   Prox LAD lesion is 60% stenosed.  Mid LAD lesion is 70% stenosed.   LIMA graft was visualized by angiography.   SVG-OM3 graft was visualized by angiography and is normal in caliber.  The graft exhibits no disease.   SVG-rPDA  graft was visualized by angiography and is large.  The graft exhibits no disease.   Hemodynamic findings consistent with moderate pulmonary hypertension with Severe RV Failure.   There is no aortic valve stenosis. POST-CATH FINDINGS Severe combined CHF with severely elevated right-sided pressures: RAP mean 24 mmHg with large mild wave.; RV P-EDP 57/8-17 mmHg.  PAP-mean 6/37-42 mmHg; PCWP 35 mmHg with V wave up to 43. Significant respiratory variation. LV P-EDP 118/16-22 mmHg, AO P-MAP 106/73-79 mmHg Ao sat 96, PA sat 54%. CO-CI (Fick) 4.51-2.35; PAPI 0.86 Significant native vessel CAD with 3 of 3 patent grafts: 100% proximal RCA CTO; 100% mLCx; Prox LAD 60% & mLAD ~70% (competetive flow from LIMA-LAD) Patent LIMA-LAD, SVG-PDA, SVG-OM 3 RECOMMENDATIONS: Return patient to nursing unit for ongoing care with IV diuresis => Heart Failure Service Consulted based on Severe RV Failure She was given 80 mg IV Lasix in the Cath Lab With the potential for having other invasive procedures including PICC line, I have written to restart IV heparin 2 hours after sheath removal.  Would convert back to DOAC once invasive procedures are completed. Of note, she was in A-fib RVR with rates in the 100 120 bpm range. Bryan Lemma, MD   Cardiac Studies   See above  Assessment   Principal  Problem:   Acute on chronic systolic (congestive) heart failure (HCC) Active Problems:   AF (paroxysmal atrial fibrillation) (HCC)   CAD (coronary artery disease)   Plan   Breathing is a little better today- plan for renal artery dopplers today. She is NPO for this, but could resume previous diet afterwards. Will continue with IV BID lasix today - hold on additional zaroxlyn d/t increase in creatinine yesterday. Afib rate control is suboptimal, since she is more compensated, will further increase Toprol this am to 100 mg daily. Anemic - H/H 10/34, but iron studies normal.   Time Spent Directly with Patient:  I have spent a total of 25 minutes with the patient reviewing hospital notes, telemetry, EKGs, labs and examining the patient as well as establishing an assessment and plan that was discussed personally with the patient.  > 50% of time was spent in direct patient care.  Length of Stay:  LOS: 6 days   Chrystie Nose, MD, [facility], FACP  [place]  [facility]  Medical Director of the [facility]  [facility] Diplomate of the American Board of Clinical Lipidology Attending Cardiologist  Direct Dial: [phone]  Fax: [phone]  Website:  [email or web address]  Lisette Abu Javier Mamone 03/19/2023, 8:14 AM

## 2023-03-19 NOTE — Progress Notes (Signed)
ANTICOAGULATION CONSULT NOTE  Pharmacy Consult for Eliquis >> IV heparin Indication: atrial fibrillation  No Known Allergies  Patient Measurements: Height: 5\' 6"  (167.6 cm) Weight: 77.2 kg (170 lb 1.6 oz) IBW/kg (Calculated) : 59.3 Heparin Dosing Weight: 76.2 kg  Vital Signs: Temp: 98.3 F (36.8 C) (07/04 0410) Temp Source: Oral (07/04 0410) BP: 133/55 (07/04 0410) Pulse Rate: 102 (07/04 0410)  Labs: Recent Labs    03/17/23 0136 03/17/23 1537 03/17/23 1541 03/18/23 0430 03/19/23 0432 03/19/23 0515  HGB  --    < > 10.5* 9.1* 10.7*  --   HCT  --    < > 31.0* 29.3* 34.9*  --   PLT  --   --   --  153 198  --   APTT 93*  --   --  97*  --   --   HEPARINUNFRC 0.68  --   --  0.51  --  0.36  CREATININE 1.24*  --   --  1.36* 1.57*  --    < > = values in this interval not displayed.     Estimated Creatinine Clearance: 34.5 mL/min (A) (by C-G formula based on SCr of 1.57 mg/dL (H)).   Assessment: 72 yo female presented with chest pain, epigastric pain, and emesis. Patient is on Eliquis 5mg  BID PTA for hx Afib. Eliquis was resumed on admission and last dose was 6/28 @ 0900. Pharmacy has been consulted to transition Eliquis to IV heparin with plans for cardiac cath.  Now s/p cath 7/2. Pharmacy consulted to resume heparin 2 hrs post-TR band removal (removed at 1830 per RN). Heparin previously therapeutic at 1250 units/hr. CBC stable (last plt 6/30). No bleed issues reported.  7/4 AM update:  Heparin level therapeutic  (0.36) No signs of bleeding or issues with hep gtt  Goal of Therapy:  Heparin level 0.3 to 0.7 Monitor platelets by anticoagulation protocol: Yes   Plan:  Cont heparin 1250 units/hr Daily heparin level / CBC Monitor for signs of bleeding and issues with heparin infusion Follow-up on restart of apixaban   Ralf Konopka BS, PharmD, BCPS Clinical Pharmacist 03/19/2023 7:39 AM  Contact: [phone] after 3 PM  "Be curious, not judgmental..." -Debbora Dus

## 2023-03-19 NOTE — Progress Notes (Signed)
PROGRESS NOTE  Monica Zamora ZOX:[id number] DOB: 10-19-1950 DOA: 03/13/2023 PCP: Rebecka Apley, NP  Brief History   Patient is a 72 year old female, extensive vasculopath without noted significant workup, with history of coronary artery disease status post CABG, hypertension and atrial fibrillation. Patient was admitted with lower sternal/epigastric pain.  Chest pain was said to be radiating to the neck. Associated nausea and vomiting.  Non rising troponin.  EKG revealed A-fib with nonspecific T wave changes.  The patient underwent left and right heart catheterizations on 03/17/2023.  It demonstrated:   Severe combined CHF with severely elevated right-sided pressures: RAP mean 24 mmHg with large mild wave.; RV P-EDP 57/8-17 mmHg. PAP-mean 6/37-42 mmHg; PCWP 35 mmHg with V wave up to 43. Significant respiratory variation. LV P-EDP 118/16-22 mmHg, AO P-MAP 106/73-79 mmHg Ao sat 96, PA sat 54%. CO-CI (Fick) 4.51-2.35; PAPI 0.86 Significant native vessel CAD with 3 of 3 patent grafts: 100% proximal RCA CTO; 100% mLCx; Prox LAD 60% & mLAD ~70% (competetive flow from LIMA-LAD) Patent LIMA-LAD, SVG-PDA, SVG-OM 3 Cardiology's recommendations are for continued diuresis with IV lasix and heart failure service consult due to severe RV failure. She will be continued on IV heparin for now, but will be converted back to DOAC once invasive procedures are completed.   CTA chest/abdomen/pelvis revealed: 1. No evidence of thoracoabdominal aortic aneurysm or dissection. 2. No pulmonary embolism identified. 3. Extensive multi-vessel coronary artery calcification, status post coronary artery bypass grafting. 4. Morphologic changes in keeping with pulmonary arterial hypertension and right heart failure with retrograde opacification of the hepatic venous system. 5. Mosaic attenuation within the pulmonary parenchyma and bronchial wall thickening in keeping with airway inflammation and multifocal air  trapping. 6. 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 Clinical correlation for signs and symptoms of chronic mesenteric ischemia may be helpful. 7. Diffusely diminutive renal arteries bilaterally. Superimposed focal hemodynamically significant stenosis of the right renal artery origin. 8. Peripheral vascular disease with hemodynamically significant stenoses of the common iliac artery origins bilaterally and diffusely diseased lower extremity arterial inflow vasculature. 9. Morphologic changes suggesting cirrhosis. This could be confirmed with hepatic elastography or tissue sampling. Mild ascites.   Aortic Atherosclerosis     03/18/2023: Creatinine has modestly increased today to 1.36. Will continue to monitor renal status. Cardiology has requested a triple lumen catheter today to follow CVP. Chest pain is felt to be due to decompensated heart failure. Her lasix has been increased to 80 mg IV bid. Recommendations include farxiga 10 mg daily, spironolactone 12.5 daily, Imdur 30 mg daily, and hydralazine 25 mg tid. Consider entresto after Doppler of renal artery is concluded. Intervention may be considered if stenosis is severe or blood pressures are effected. Metoprolol has been changed from tartrate to 50 mg succinate . Heparin will be switched to eliquis once the procedures are complete.   03/19/2023: No new complaints.  Was normal serum creatinine.  Repeat serum creatinine in the morning.  Patient is at risk of contrast associated nephropathy.  Objective   Vitals:  Vitals:   03/19/23 1252 03/19/23 1325  BP: 97/62 106/70  Pulse: 86 80  Resp: 20 18  Temp: 98.2 F (36.8 C)   SpO2: 95% 96%    Exam:  Constitutional:  The patient is awake, alert, and oriented x 3. No acute distress. Respiratory:  Decreased air entry globally. Cardiovascular:  S1-S2.Marland Kitchen Abdomen:  Abdomen is obese, soft, non-tender. Musculoskeletal:  Leg edema is  improving.  Neurologic: Awake and alert.  Patient was all extremities.  S   I have personally reviewed the following:    Scheduled Meds:  aspirin EC  81 mg Oral Daily   atorvastatin  40 mg Oral Daily   Chlorhexidine Gluconate Cloth  6 each Topical Daily   dapagliflozin propanediol  10 mg Oral Daily   furosemide  80 mg Intravenous BID   hydrALAZINE  25 mg Oral Q8H   isosorbide mononitrate  30 mg Oral Daily   metolazone  2.5 mg Oral Once   [START ON 03/20/2023] metoprolol succinate  50 mg Oral Daily   sertraline  100 mg Oral Daily   sodium chloride flush  10-40 mL Intracatheter Q12H   sodium chloride flush  3 mL Intravenous Q12H   sodium chloride flush  3 mL Intravenous Q12H   spironolactone  12.5 mg Oral Daily   Continuous Infusions:  sodium chloride     amiodarone 60 mg/hr (03/19/23 1129)   amiodarone     heparin 1,250 Units/hr (03/19/23 1224)   sodium chloride Stopped (03/13/23 0305)    Principal Problem:   Acute on chronic combined systolic and diastolic CHF (congestive heart failure) (HCC) Active Problems:   AF (paroxysmal atrial fibrillation) (HCC)   CAD (coronary artery disease)  A & P  Principal Problem:   Acute on chronic systolic (congestive) heart failure (HCC)   Acute on chronic HFrEF 40-45% -Last 2D echo done on 03/04/2023 revealed LVEF 40-45%, left ventricle global hypokinesis -Patient presented with volume overload on exam, right JVD, bilateral lower extremity pitting edema, BNP greater than 1600 -IV Lasix.   -Start strict I's and O's and daily weight -LHC completed 7/2. Results above.   Chest pain:  Elevated troponin: Troponin flat, 39, repeat 40 For possible left and right heart catheterization tomorrow. Continue to monitor renal function.   -LHC completed 7/2. Results above.   AKI on CKD 3A: -Baseline creatinine appears to be 1.1 with GFR 51 -Presented with creatinine of 1.64 with GFR of 33 -AKI is likely multifactorial, including, but not  limited to ATN/cardiorenal syndrome/exposure to contrast. -Aggressive diuresis. -Avoid hypotension. -Avoid nephrotoxic medications. -Keep MAP greater than 65 mmHg. -Strict I's and O's. -Dose all medications assuming GFR of less than 50 mL/min. -Serum creatinine improved from 1.88 to 1.36 today. 03/19/2023: Cautious use of diuretics.  Serum creatinine of 1.57 noted today.   Elevated liver chemistries, unclear etiology, suspect related to alcohol use -Initially elevated AST, but back to normal. -Continue to monitor for now -Avoid hepatotoxic agents -Patient has a history of heavy alcohol use. She states that she has not had a drink for 2 months.   Permanent atrial fibrillation, currently rate controlled, on Eliquis Initially presented hypotensive and bradycardic, home beta-blocker was reversed with glucagon Resume home Eliquis Closely monitor on telemetry Rate control with metoprolol. Anticipate transition to toprol for discharge.  Renal artery stenosis: On CTa nd ultrasound. Doppler of renal arteries pending. Consider entresto if appropriate.   Chronic anxiety/depression Resume home Zoloft   Chronic hypoxic respiratory failure on 2 L nasal cannula continuously Appears to be at her baseline oxygen requirement Maintain O2 saturation above 90%   Extensive vascular disease: -Vascular surgery team has been consulted. -Abdominal pain on presentation, but with normal lactic acid.   DVT prophylaxis: Heparin drip. Code Status: Full code. Family Communication:  Disposition Plan: This will depend on hospital course.     Consultants:  Cardiology.   Procedures:  Cardiac catheterization    Antimicrobials:  None.  Berton Mount, MD [facility] Direct contact: see [email or web address]  7PM-7AM contact night coverage as above 03/18/2023, 7:24 PM  LOS: 4 days     LOS: 6 days

## 2023-03-19 NOTE — Progress Notes (Signed)
Physical Therapy Treatment Patient Details Name: Monica Zamora MRN: [id number] DOB: 1951-07-16 Today's Date: 03/19/2023   History of Present Illness 72 yo female admitted 6/28 with chest pain, bradycardia, hypotension and acute on chronic CHF. PMHx: Afib on Eliquis, HTN, respiratory failure on 2L, CAD s/p CABG, HFrEF    PT Comments  Pt received in supine and agreeable to session. Pt requesting to use the [facility] and was able to step pivot without AD with assist for line management and close min guard due to some instability. Pt unable to void and pivots to recliner with min guard. Pt deferring ambulation this session due to fatigue, however is agreeable to exercises. Pt able to perform seated therex with cues for technique. Pt able to stand x2 from recliner with and without BUE support with min guard for safety and no LOB. Pt requesting to use the [facility] again and is able to successfully void. Pt requesting to return to the bed at the end of the session and is able to perform another step pivot transfer with assist for lines and demonstrates slow, steady steps. Pt reporting using BSC without calling staff for assistance, RN notified. Pt educated on importance of calling staff to get OOB for safety. Pt continues to benefit from PT services to progress toward functional mobility goals.     Assistance Recommended at Discharge Intermittent Supervision/Assistance  If plan is discharge home, recommend the following:  Can travel by private vehicle    A little help with bathing/dressing/bathroom;Assistance with cooking/housework;Assist for transportation      Equipment Recommendations  BSC/3in1    Recommendations for Other Services       Precautions / Restrictions Precautions Precautions: Fall;Other (comment) Precaution Comments: watch sats Restrictions Weight Bearing Restrictions: No     Mobility  Bed Mobility Overal bed mobility: Modified Independent                   Transfers Overall transfer level: Needs assistance Equipment used: None Transfers: Sit to/from Stand, Bed to chair/wheelchair/BSC Sit to Stand: Min guard   Step pivot transfers: Min guard       General transfer comment: STS from EOB and recliner with and without UE support with min guard for safety. Pt able to step pivot from EOB to [facility] and then to recliner with min guard and assist with line management    Ambulation/Gait               General Gait Details: Pt deferred this session due to fatigue       Balance Overall balance assessment: Needs assistance Sitting-balance support: Bilateral upper extremity supported, Feet supported Sitting balance-Leahy Scale: Good Sitting balance - Comments: sitting EOB   Standing balance support: During functional activity, No upper extremity supported Standing balance-Leahy Scale: Fair Standing balance comment: Pt able to step pivot without RW to [facility] without LOB                            Cognition Arousal/Alertness: Awake/alert Behavior During Therapy: WFL for tasks assessed/performed Overall Cognitive Status: Within Functional Limits for tasks assessed                                          Exercises General Exercises - Lower Extremity Ankle Circles/Pumps: AROM, Both, 5 reps, Seated Quad Sets: AROM, Seated, Both, 10 reps Long  Arc Quad: AROM, Seated, Both, 10 reps Heel Slides: AROM, Seated, Both, 10 reps Hip ABduction/ADduction: AROM, Seated, Both, 10 reps Hip Flexion/Marching: AROM, Seated, Both, 10 reps    General Comments General comments (skin integrity, edema, etc.): VSS. Pt able to void in [facility]      Pertinent Vitals/Pain Pain Assessment Pain Assessment: No/denies pain     PT Goals (current goals can now be found in the care plan section) Acute Rehab PT Goals Patient Stated Goal: return to vacuuming and walking more PT Goal Formulation: With patient Time For Goal Achievement:  03/27/23 Potential to Achieve Goals: Good Progress towards PT goals: Progressing toward goals    Frequency    Min 1X/week      PT Plan Current plan remains appropriate       AM-PAC PT "6 Clicks" Mobility   Outcome Measure  Help needed turning from your back to your side while in a flat bed without using bedrails?: None Help needed moving from lying on your back to sitting on the side of a flat bed without using bedrails?: None Help needed moving to and from a bed to a chair (including a wheelchair)?: A Little Help needed standing up from a chair using your arms (e.g., wheelchair or bedside chair)?: A Little Help needed to walk in hospital room?: A Little Help needed climbing 3-5 steps with a railing? : A Little 6 Click Score: 20    End of Session   Activity Tolerance: Patient tolerated treatment well Patient left: with call bell/phone within reach;in bed Nurse Communication: Mobility status PT Visit Diagnosis: Other abnormalities of gait and mobility (R26.89);Difficulty in walking, not elsewhere classified (R26.2)     Time: 1610-9604 PT Time Calculation (min) (ACUTE ONLY): 26 min  Charges:    $Therapeutic Exercise: 8-22 mins $Therapeutic Activity: 8-22 mins PT General Charges $$ ACUTE PT VISIT: 1 Visit                     Johny Shock, PTA Acute Rehabilitation Services Secure Chat Preferred  [phone]    Johny Shock 03/19/2023, 2:32 PM

## 2023-03-19 NOTE — TOC Transition Note (Signed)
Transition of Care [facility]) - CM/SW Discharge Note   Patient Details  Name: Monica Zamora MRN: [id number] Date of Birth: [birth year]  Transition of Care [facility]) CM/SW Contact:  Glennon Mac, RN Phone Number: 03/19/2023, 1:43 PM   Clinical Narrative:    Met with patient to discuss discharge planning; PTA, pt lives alone.  She states she has a "caregiver" who comes in daily to assist her, but not the same hours every day.  She is on home oxygen provided by [employer], and is interested in getting a more portable device for her oxygen.  She states the order has been sent to [employer], but so far she has not received it.  Patient open to [facility] services, and HH has been arranged with [facility] for HHPT and RN; MD: please add OT to home health orders, as recommended by therapy.  Patient states that nebulizer machine was delivered to her room this morning.   Will notify [facility] agency upon dc.     Barriers to Discharge: Continued Medical Work up   Patient Goals and CMS Choice CMS Medicare.gov Compare Post Acute Care list provided to:: Patient Choice offered to / list presented to : Patient                          Discharge Plan and Services Additional resources added to the After Visit Summary for     Discharge Planning Services: CM Consult Post Acute Care Choice: Home Health          DME Arranged: Nebulizer machine         HH Arranged: RN, PT, OT [facility] Agency: [facility] Date [facility] Agency Contacted: 03/18/23 Time HH Agency Contacted: [phone] Representative spoke with at [facility] Agency: Tresa Endo  Social Determinants of Health (SDOH) Interventions SDOH Screenings   Food Insecurity: No Food Insecurity (03/13/2023)  Housing: Low Risk  (03/13/2023)  Transportation Needs: No Transportation Needs (03/13/2023)  Alcohol Screen: Low Risk  (03/13/2023)  Depression (PHQ2-9): Low Risk  (06/24/2021)  Recent Concern: Depression (PHQ2-9) - Medium Risk (05/06/2021)  Financial Resource Strain: Low  Risk  (03/13/2023)  Tobacco Use: Medium Risk (03/18/2023)     Readmission Risk Interventions     No data to display          Quintella Baton, RN, BSN  Trauma/Neuro ICU Case Manager [phone]

## 2023-03-19 NOTE — Progress Notes (Signed)
Patient ID: Monica Zamora, female   DOB: 1951-08-01, 72 y.o.   MRN: [id number]     Advanced Heart Failure Rounding Note  PCP-Cardiologist: Olga Millers, MD   Subjective:    HR higher today in 120s-130s, atrial fibrillation.  Not sure I/Os fully recorded yesterday but weight down 5 lbs.  Creatinine 1.36 => 1.57. Co-ox not accurate.  CVP 18 today.   Patient has had loose stool starting yesterday, no blood.    Objective:   Weight Range: 77.2 kg Body mass index is 27.45 kg/m.   Vital Signs:   Temp:  [97.6 F (36.4 C)-98.3 F (36.8 C)] 98 F (36.7 C) (07/04 0830) Pulse Rate:  [100-127] 100 (07/04 0830) Resp:  [18-22] 20 (07/04 0830) BP: (99-133)/(49-74) 106/57 (07/04 0830) SpO2:  [96 %-100 %] 97 % (07/04 0830) Weight:  [77.2 kg] 77.2 kg (07/04 0410) Last BM Date : 03/18/23  Weight change: Filed Weights   03/17/23 0527 03/18/23 0332 03/19/23 0410  Weight: 80.4 kg 79.7 kg 77.2 kg    Intake/Output:   Intake/Output Summary (Last 24 hours) at 03/19/2023 1035 Last data filed at 03/19/2023 0832 Gross per 24 hour  Intake 857.5 ml  Output 2554 ml  Net -1696.5 ml      Physical Exam    General: NAD Neck: JVP 16 cm, no thyromegaly or thyroid nodule.  Lungs: Clear to auscultation bilaterally with normal respiratory effort. CV: Nondisplaced PMI.  Heart tachy, irregular S1/S2, no S3/S4, no murmur.  1+ ankle edema.  Abdomen: Soft, nontender, no hepatosplenomegaly, no distention.  Skin: Intact without lesions or rashes.  Neurologic: Alert and oriented x 3.  Psych: Normal affect. Extremities: No clubbing or cyanosis.  HEENT: Normal.   Telemetry   Afib 120s-130s (personally reviewed)  Labs    CBC Recent Labs    03/18/23 0430 03/19/23 0432  WBC 3.3* 4.9  HGB 9.1* 10.7*  HCT 29.3* 34.9*  MCV 110.2* 107.4*  PLT 153 198   Basic Metabolic Panel Recent Labs    82/95/62 0136 03/17/23 1537 03/18/23 0430 03/19/23 0432  NA 137   < > 138 140  K 3.7   < > 3.5 4.6   CL 103  --  101 98  CO2 27  --  27 30  GLUCOSE 124*  --  91 108*  BUN 24*  --  22 21  CREATININE 1.24*  --  1.36* 1.57*  CALCIUM 8.5*  --  8.4* 9.4  MG  --   --   --  1.8  PHOS 2.9  --   --   --    < > = values in this interval not displayed.   Liver Function Tests Recent Labs    03/17/23 0136  ALBUMIN 2.9*   No results for input(s): "LIPASE", "AMYLASE" in the last 72 hours. Cardiac Enzymes No results for input(s): "CKTOTAL", "CKMB", "CKMBINDEX", "TROPONINI" in the last 72 hours.  BNP: BNP (last 3 results) Recent Labs    03/13/23 0109  BNP 1,662.5*    ProBNP (last 3 results) No results for input(s): "PROBNP" in the last 8760 hours.   D-Dimer No results for input(s): "DDIMER" in the last 72 hours. Hemoglobin A1C No results for input(s): "HGBA1C" in the last 72 hours. Fasting Lipid Panel No results for input(s): "CHOL", "HDL", "LDLCALC", "TRIG", "CHOLHDL", "LDLDIRECT" in the last 72 hours. Thyroid Function Tests No results for input(s): "TSH", "T4TOTAL", "T3FREE", "THYROIDAB" in the last 72 hours.  Invalid input(s): "FREET3"  Other results:  Imaging    VAS US RENAL ARTERY DUPLEX  Result Date: 03/19/2023 ABDOMINAL VISCERAL Patient Name:  Monica Zamora  Date of Exam:   03/19/2023 Medical Rec #: [id number]            Accession #:    [id number] Date of Birth: 1951/02/21            Patient Gender: F Patient Age:   10 years Exam Location:  [facility] Procedure:      VAS US RENAL ARTERY DUPLEX Referring Phys: [id number] LINDSAY NICOLE [facility] -------------------------------------------------------------------------------- Indications: Stenosis, renal I70.1 High Risk Factors: Hypertension, hyperlipidemia. Limitations: Air/bowel gas, obesity, patient discomfort and respiratory disturbance. Comparison Study: No prior studies. Performing Technologist: Chanda Busing RVT  Examination Guidelines: A complete evaluation includes B-mode imaging, spectral Doppler, color Doppler,  and power Doppler as needed of all accessible portions of each vessel. Bilateral testing is considered an integral part of a complete examination. Limited examinations for reoccurring indications may be performed as noted.  Duplex Findings: +--------------------+--------+--------+------+------------------+ Mesenteric          PSV cm/sEDV cm/sPlaque     Comments      +--------------------+--------+--------+------+------------------+ Aorta Mid             348     100                            +--------------------+--------+--------+------+------------------+ Celiac Artery Origin                      Unable to insonate +--------------------+--------+--------+------+------------------+ SMA Origin                                Unable to insonate +--------------------+--------+--------+------+------------------+    +------------------+--------+--------+-------+ Right Renal ArteryPSV cm/sEDV cm/sComment +------------------+--------+--------+-------+ Origin               74      13           +------------------+--------+--------+-------+ Proximal            101      18           +------------------+--------+--------+-------+ Mid                 125      19           +------------------+--------+--------+-------+ Distal              143      29           +------------------+--------+--------+-------+ +-----------------+--------+--------+-------+ Left Renal ArteryPSV cm/sEDV cm/sComment +-----------------+--------+--------+-------+ Origin              45      13           +-----------------+--------+--------+-------+ Proximal            74      20           +-----------------+--------+--------+-------+ Mid                 52      18           +-----------------+--------+--------+-------+ Distal              50      11           +-----------------+--------+--------+-------+ +------------+--------+--------+----+-----------+--------+--------+----+ Right  KidneyPSV cm/sEDV cm/sRI  Left KidneyPSV cm/sEDV cm/sRI   +------------+--------+--------+----+-----------+--------+--------+----+  Upper Pole  34      10      0.71Upper Pole 42      8       0.82 +------------+--------+--------+----+-----------+--------+--------+----+ Mid         43      9       0.        51      15      0.71 +------------+--------+--------+----+-----------+--------+--------+----+ Lower Pole  65      21      0.67Lower Pole 50      9       0.82 +------------+--------+--------+----+-----------+--------+--------+----+ Hilar       88      17      0.81Hilar      55      17      0.68 +------------+--------+--------+----+-----------+--------+--------+----+ +------------------+----+------------------+----+ Right Kidney          Left Kidney            +------------------+----+------------------+----+ RAR                   RAR                    +------------------+----+------------------+----+ RAR (manual)      0.41RAR (manual)      0.21 +------------------+----+------------------+----+ Cortex                Cortex                 +------------------+----+------------------+----+ Cortex thickness      Corex thickness        +------------------+----+------------------+----+ Kidney length (cm)8.00Kidney length (cm)8.00 +------------------+----+------------------+----+   Summary: Renal:  Right: Abnormal size for the right kidney. Normal right Resisitive        Index. 1-59% stenosis of the right renal artery. Left:  Abnormal size for the left kidney. Normal left Resistive        Index. 1-59% stenosis of the left renal artery.  *See table(s) above for measurements and observations.     Preliminary    ECHOCARDIOGRAM LIMITED  Result Date: 03/18/2023    ECHOCARDIOGRAM LIMITED REPORT   Patient Name:   Monica Zamora Date of Exam: 03/18/2023 Medical Rec #:  [id number]           Height:       66.0 in Accession #:    [id number]           Weight:       175.6 lb Date of Birth:  [birth year]           BSA:          1.892 m Patient Age:    72 years            BP:           121/85 mmHg Patient Gender: F                   HR:           93 bpm. Exam Location:  Inpatient Procedure: Limited Echo, Cardiac Doppler and Limited Color Doppler Indications:     Acute respiratory distress  History:         Patient has prior history of Echocardiogram examinations, most                  recent 03/04/2023. HFmrEF, CAD, Prior CABG, COPD and Carotid  Disease, Arrythmias:Atrial Fibrillation; Risk                  Factors:Hypertension and Former Smoker.  Sonographer:     Milda Smart Referring Phys:  [id number] Dorthula Nettles Diagnosing Phys: Epifanio Lesches MD IMPRESSIONS  1. Left ventricular ejection fraction, by estimation, is 35 to 40%. The left ventricle has moderately decreased function. The left ventricle demonstrates global hypokinesis. There is mild left ventricular hypertrophy.  2. Right ventricular systolic function is moderately reduced. The right ventricular size is normal. There is moderately elevated pulmonary artery systolic pressure. The estimated right ventricular systolic pressure is 52.2 mmHg.  3. The mitral valve is normal in structure. Mild mitral valve regurgitation.  4. Tricuspid valve regurgitation is moderate.  5. The aortic valve is tricuspid. Aortic valve regurgitation is not visualized. Aortic valve sclerosis is present, with no evidence of aortic valve stenosis.  6. The inferior vena cava is dilated in size with <50% respiratory variability, suggesting right atrial pressure of 15 mmHg. FINDINGS  Left Ventricle: Left ventricular ejection fraction, by estimation, is 35 to 40%. The left ventricle has moderately decreased function. The left ventricle demonstrates global hypokinesis. The left ventricular internal cavity size was normal in size. There is mild left ventricular hypertrophy. Right Ventricle: The right ventricular  size is normal. Right ventricular systolic function is moderately reduced. There is moderately elevated pulmonary artery systolic pressure. The tricuspid regurgitant velocity is 3.05 m/s, and with an assumed right atrial pressure of 15 mmHg, the estimated right ventricular systolic pressure is 52.2 mmHg. Pericardium: There is no evidence of pericardial effusion. Mitral Valve: The mitral valve is normal in structure. Mild mitral valve regurgitation. Tricuspid Valve: The tricuspid valve is normal in structure. Tricuspid valve regurgitation is moderate. Aortic Valve: The aortic valve is tricuspid. Aortic valve regurgitation is not visualized. Aortic valve sclerosis is present, with no evidence of aortic valve stenosis. Pulmonic Valve: The pulmonic valve was not well visualized. Pulmonic valve regurgitation is trivial. Venous: The inferior vena cava is dilated in size with less than 50% respiratory variability, suggesting right atrial pressure of 15 mmHg. IAS/Shunts: No atrial level shunt detected by color flow Doppler. Additional Comments: Spectral Doppler performed. Color Doppler performed.  LEFT VENTRICLE PLAX 2D LVIDd:         5.10 cm LVIDs:         4.20 cm LV PW:         1.20 cm LV IVS:        1.20 cm  RIGHT VENTRICLE RV Basal diam:  3.30 cm RV Mid diam:    2.60 cm TAPSE (M-mode): 0.6 cm LEFT ATRIUM         Index LA diam:    4.00 cm 2.11 cm/m  TRICUSPID VALVE TR Peak grad:   37.2 mmHg TR Vmax:        305.00 cm/s Epifanio Lesches MD Electronically signed by Epifanio Lesches MD Signature Date/Time: 03/18/2023/11:52:55 AM    Final (Updated)      Medications:     Scheduled Medications:  aspirin EC  81 mg Oral Daily   atorvastatin  40 mg Oral Daily   Chlorhexidine Gluconate Cloth  6 each Topical Daily   dapagliflozin propanediol  10 mg Oral Daily   furosemide  80 mg Intravenous BID   hydrALAZINE  25 mg Oral Q8H   isosorbide mononitrate  30 mg Oral Daily   metolazone  2.5 mg Oral Once   [START ON  03/20/2023] metoprolol succinate  50 mg Oral Daily   sertraline  100 mg Oral Daily   sodium chloride flush  10-40 mL Intracatheter Q12H   sodium chloride flush  3 mL Intravenous Q12H   sodium chloride flush  3 mL Intravenous Q12H   spironolactone  12.5 mg Oral Daily    Infusions:  sodium chloride     amiodarone     amiodarone     heparin 1,250 Units/hr (03/18/23 1702)   sodium chloride Stopped (03/13/23 0305)    PRN Medications: sodium chloride, acetaminophen, ALPRAZolam, loperamide, melatonin, ondansetron (ZOFRAN) IV, polyethylene glycol, prochlorperazine, sodium chloride flush, sodium chloride flush  Assessment/Plan   1. Acute on chronic systolic CHF:  Echo this admission with EF 35-40%, moderately decreased RV systolic function.  RHC this admission with biventricular elevated filling pressures, low PAPi but preserved CI.  Pulmonary venous hypertension.  Suspect ischemic cardiomyopathy + component of cor pulmonale from COPD.  She is volume overloaded still with CVP 18, creatinine 1.36 => 1.57 with diuresis yesterday.  Weight is down 5 lbs.   - Lasix 80 mg IV bid and will give a dose of metolazone 2.5 x 1 again today.  Renal function may limit further aggressive diuresis if it continues to worsen.  - Co-ox inaccurate, will repeat.  - With significant volume overload, would not increase Toprol XL.  Keep at 50 mg daily.  - Farxiga 10 daily - spironolactone 12.5 daily - hydralazine 25 tid + Imdur 30 for now.  She has significant bilateral renal artery stenosis by prior CTA but renal artery doppler does not suggest hemodynamic significance.  Could probably transition to [facility] in the future but would like to see stable creatinine.  2. CKD stage 3: Creatinine higher 1.36 => 1.57.  Follow closely with diuresis.   3. Atrial fibrillation: History of PAF.  AF this admission, was in NSR in 5/24.  Probably not a good long-term amiodarone candidate with severe COPD on home oxygen.  Currently, HR is  uncontrolled in 120s-130s despite receiving 100 mg Toprol XL today.  - As above, would keep Toprol XL at 50 mg daily with significant volume overload.  - I am going to add amiodarone gtt for now for rate control.  I will not continue this medication long-term given her lung disease.  - Continue anticoagulation (currently on heparin gtt, eventually restart Eliquis).   - Once she is diuresed, arrange for TEE-guided DCCV (?Friday). Discussed risks/benefits with patient and she agrees to procedure.  - May be a candidate for Tikosyn in future to maintain NSR.  4. CAD s/p CABG: Cath 7/2 with patent grafts.  No ACS. No chest pain.  - Continue statin.  5. COPD: Severe, on home oxygen.  6. Renal artery stenosis: By CTA.  Renal artery dopplers suggested 1-59% bilateral ICA stenosis.   7. Mesenteric artery stenosis: No intestinal angina reported.   [name] 03/19/2023 10:35 AM

## 2023-03-19 NOTE — H&P (View-Only) (Signed)
Patient ID: Monica Zamora, female   DOB: 06/25/1951, 72 y.o.   MRN: [id number]     Advanced Heart Failure Rounding Note  PCP-Cardiologist: Brian Crenshaw, MD   Subjective:    HR higher today in 120s-130s, atrial fibrillation.  Not sure I/Os fully recorded yesterday but weight down 5 lbs.  Creatinine 1.36 => 1.57. Co-ox not accurate.  CVP 18 today.   Patient has had loose stool starting yesterday, no blood.    Objective:   Weight Range: 77.2 kg Body mass index is 27.45 kg/m.   Vital Signs:   Temp:  [97.6 F (36.4 C)-98.3 F (36.8 C)] 98 F (36.7 C) (07/04 0830) Pulse Rate:  [100-127] 100 (07/04 0830) Resp:  [18-22] 20 (07/04 0830) BP: (99-133)/(49-74) 106/57 (07/04 0830) SpO2:  [96 %-100 %] 97 % (07/04 0830) Weight:  [77.2 kg] 77.2 kg (07/04 0410) Last BM Date : 03/18/23  Weight change: Filed Weights   03/17/23 0527 03/18/23 0332 03/19/23 0410  Weight: 80.4 kg 79.7 kg 77.2 kg    Intake/Output:   Intake/Output Summary (Last 24 hours) at 03/19/2023 1035 Last data filed at 03/19/2023 0832 Gross per 24 hour  Intake 857.5 ml  Output 2554 ml  Net -1696.5 ml      Physical Exam    General: NAD Neck: JVP 16 cm, no thyromegaly or thyroid nodule.  Lungs: Clear to auscultation bilaterally with normal respiratory effort. CV: Nondisplaced PMI.  Heart tachy, irregular S1/S2, no S3/S4, no murmur.  1+ ankle edema.  Abdomen: Soft, nontender, no hepatosplenomegaly, no distention.  Skin: Intact without lesions or rashes.  Neurologic: Alert and oriented x 3.  Psych: Normal affect. Extremities: No clubbing or cyanosis.  HEENT: Normal.   Telemetry   Afib 120s-130s (personally reviewed)  Labs    CBC Recent Labs    03/18/23 0430 03/19/23 0432  WBC 3.3* 4.9  HGB 9.1* 10.7*  HCT 29.3* 34.9*  MCV 110.2* 107.4*  PLT 153 198   Basic Metabolic Panel Recent Labs    03/17/23 0136 03/17/23 1537 03/18/23 0430 03/19/23 0432  NA 137   < > 138 140  K 3.7   < > 3.5 4.6   CL 103  --  101 98  CO2 27  --  27 30  GLUCOSE 124*  --  91 108*  BUN 24*  --  22 21  CREATININE 1.24*  --  1.36* 1.57*  CALCIUM 8.5*  --  8.4* 9.4  MG  --   --   --  1.8  PHOS 2.9  --   --   --    < > = values in this interval not displayed.   Liver Function Tests Recent Labs    03/17/23 0136  ALBUMIN 2.9*   No results for input(s): "LIPASE", "AMYLASE" in the last 72 hours. Cardiac Enzymes No results for input(s): "CKTOTAL", "CKMB", "CKMBINDEX", "TROPONINI" in the last 72 hours.  BNP: BNP (last 3 results) Recent Labs    03/13/23 0109  BNP 1,662.5*    ProBNP (last 3 results) No results for input(s): "PROBNP" in the last 8760 hours.   D-Dimer No results for input(s): "DDIMER" in the last 72 hours. Hemoglobin A1C No results for input(s): "HGBA1C" in the last 72 hours. Fasting Lipid Panel No results for input(s): "CHOL", "HDL", "LDLCALC", "TRIG", "CHOLHDL", "LDLDIRECT" in the last 72 hours. Thyroid Function Tests No results for input(s): "TSH", "T4TOTAL", "T3FREE", "THYROIDAB" in the last 72 hours.  Invalid input(s): "FREET3"  Other results:     Imaging    VAS US RENAL ARTERY DUPLEX  Result Date: 03/19/2023 ABDOMINAL VISCERAL Patient Name:  Monica Zamora  Date of Exam:   03/19/2023 Medical Rec #: [id number]            Accession #:    [id number] Date of Birth: 12/29/1950            Patient Gender: F Patient Age:   72 years Exam Location:  [facility] Procedure:      VAS US RENAL ARTERY DUPLEX Referring Phys: [id number] LINDSAY NICOLE FINCH -------------------------------------------------------------------------------- Indications: Stenosis, renal I70.1 High Risk Factors: Hypertension, hyperlipidemia. Limitations: Air/bowel gas, obesity, patient discomfort and respiratory disturbance. Comparison Study: No prior studies. Performing Technologist: Gregory Collins RVT  Examination Guidelines: A complete evaluation includes B-mode imaging, spectral Doppler, color Doppler,  and power Doppler as needed of all accessible portions of each vessel. Bilateral testing is considered an integral part of a complete examination. Limited examinations for reoccurring indications may be performed as noted.  Duplex Findings: +--------------------+--------+--------+------+------------------+ Mesenteric          PSV cm/sEDV cm/sPlaque     Comments      +--------------------+--------+--------+------+------------------+ Aorta Mid             348     100                            +--------------------+--------+--------+------+------------------+ Celiac Artery Origin                      Unable to insonate +--------------------+--------+--------+------+------------------+ SMA Origin                                Unable to insonate +--------------------+--------+--------+------+------------------+    +------------------+--------+--------+-------+ Right Renal ArteryPSV cm/sEDV cm/sComment +------------------+--------+--------+-------+ Origin               74      13           +------------------+--------+--------+-------+ Proximal            101      18           +------------------+--------+--------+-------+ Mid                 125      19           +------------------+--------+--------+-------+ Distal              143      29           +------------------+--------+--------+-------+ +-----------------+--------+--------+-------+ Left Renal ArteryPSV cm/sEDV cm/sComment +-----------------+--------+--------+-------+ Origin              45      13           +-----------------+--------+--------+-------+ Proximal            74      20           +-----------------+--------+--------+-------+ Mid                 52      18           +-----------------+--------+--------+-------+ Distal              50      11           +-----------------+--------+--------+-------+ +------------+--------+--------+----+-----------+--------+--------+----+ Right  KidneyPSV cm/sEDV cm/sRI  Left KidneyPSV cm/sEDV cm/sRI   +------------+--------+--------+----+-----------+--------+--------+----+   Upper Pole  34      10      0.71Upper Pole 42      8       0.82 +------------+--------+--------+----+-----------+--------+--------+----+ Mid         43      9       0.80Mid        51      15      0.71 +------------+--------+--------+----+-----------+--------+--------+----+ Lower Pole  65      21      0.67Lower Pole 50      9       0.82 +------------+--------+--------+----+-----------+--------+--------+----+ Hilar       88      17      0.81Hilar      55      17      0.68 +------------+--------+--------+----+-----------+--------+--------+----+ +------------------+----+------------------+----+ Right Kidney          Left Kidney            +------------------+----+------------------+----+ RAR                   RAR                    +------------------+----+------------------+----+ RAR (manual)      0.41RAR (manual)      0.21 +------------------+----+------------------+----+ Cortex                Cortex                 +------------------+----+------------------+----+ Cortex thickness      Corex thickness        +------------------+----+------------------+----+ Kidney length (cm)8.00Kidney length (cm)8.00 +------------------+----+------------------+----+   Summary: Renal:  Right: Abnormal size for the right kidney. Normal right Resisitive        Index. 1-59% stenosis of the right renal artery. Left:  Abnormal size for the left kidney. Normal left Resistive        Index. 1-59% stenosis of the left renal artery.  *See table(s) above for measurements and observations.     Preliminary    ECHOCARDIOGRAM LIMITED  Result Date: 03/18/2023    ECHOCARDIOGRAM LIMITED REPORT   Patient Name:   Jakhia V Sivak Date of Exam: 03/18/2023 Medical Rec #:  [id number]           Height:       66.0 in Accession #:    [id number]           Weight:       175.6 lb Date of Birth:  12/02/1950           BSA:          1.892 m Patient Age:    72 years            BP:           121/85 mmHg Patient Gender: F                   HR:           93 bpm. Exam Location:  Inpatient Procedure: Limited Echo, Cardiac Doppler and Limited Color Doppler Indications:     Acute respiratory distress  History:         Patient has prior history of Echocardiogram examinations, most                  recent 03/04/2023. HFmrEF, CAD, Prior CABG, COPD and Carotid                    Disease, Arrythmias:Atrial Fibrillation; Risk                  Factors:Hypertension and Former Smoker.  Sonographer:     Shannon O'Grady Referring Phys:  [id number] ADITYA SABHARWAL Diagnosing Phys: Christopher Schumann MD IMPRESSIONS  1. Left ventricular ejection fraction, by estimation, is 35 to 40%. The left ventricle has moderately decreased function. The left ventricle demonstrates global hypokinesis. There is mild left ventricular hypertrophy.  2. Right ventricular systolic function is moderately reduced. The right ventricular size is normal. There is moderately elevated pulmonary artery systolic pressure. The estimated right ventricular systolic pressure is 52.2 mmHg.  3. The mitral valve is normal in structure. Mild mitral valve regurgitation.  4. Tricuspid valve regurgitation is moderate.  5. The aortic valve is tricuspid. Aortic valve regurgitation is not visualized. Aortic valve sclerosis is present, with no evidence of aortic valve stenosis.  6. The inferior vena cava is dilated in size with <50% respiratory variability, suggesting right atrial pressure of 15 mmHg. FINDINGS  Left Ventricle: Left ventricular ejection fraction, by estimation, is 35 to 40%. The left ventricle has moderately decreased function. The left ventricle demonstrates global hypokinesis. The left ventricular internal cavity size was normal in size. There is mild left ventricular hypertrophy. Right Ventricle: The right ventricular  size is normal. Right ventricular systolic function is moderately reduced. There is moderately elevated pulmonary artery systolic pressure. The tricuspid regurgitant velocity is 3.05 m/s, and with an assumed right atrial pressure of 15 mmHg, the estimated right ventricular systolic pressure is 52.2 mmHg. Pericardium: There is no evidence of pericardial effusion. Mitral Valve: The mitral valve is normal in structure. Mild mitral valve regurgitation. Tricuspid Valve: The tricuspid valve is normal in structure. Tricuspid valve regurgitation is moderate. Aortic Valve: The aortic valve is tricuspid. Aortic valve regurgitation is not visualized. Aortic valve sclerosis is present, with no evidence of aortic valve stenosis. Pulmonic Valve: The pulmonic valve was not well visualized. Pulmonic valve regurgitation is trivial. Venous: The inferior vena cava is dilated in size with less than 50% respiratory variability, suggesting right atrial pressure of 15 mmHg. IAS/Shunts: No atrial level shunt detected by color flow Doppler. Additional Comments: Spectral Doppler performed. Color Doppler performed.  LEFT VENTRICLE PLAX 2D LVIDd:         5.10 cm LVIDs:         4.20 cm LV PW:         1.20 cm LV IVS:        1.20 cm  RIGHT VENTRICLE RV Basal diam:  3.30 cm RV Mid diam:    2.60 cm TAPSE (M-mode): 0.6 cm LEFT ATRIUM         Index LA diam:    4.00 cm 2.11 cm/m  TRICUSPID VALVE TR Peak grad:   37.2 mmHg TR Vmax:        305.00 cm/s Christopher Schumann MD Electronically signed by Christopher Schumann MD Signature Date/Time: 03/18/2023/11:52:55 AM    Final (Updated)      Medications:     Scheduled Medications:  aspirin EC  81 mg Oral Daily   atorvastatin  40 mg Oral Daily   Chlorhexidine Gluconate Cloth  6 each Topical Daily   dapagliflozin propanediol  10 mg Oral Daily   furosemide  80 mg Intravenous BID   hydrALAZINE  25 mg Oral Q8H   isosorbide mononitrate  30 mg Oral Daily   metolazone  2.5 mg Oral Once   [START ON  03/20/2023] metoprolol succinate    50 mg Oral Daily   sertraline  100 mg Oral Daily   sodium chloride flush  10-40 mL Intracatheter Q12H   sodium chloride flush  3 mL Intravenous Q12H   sodium chloride flush  3 mL Intravenous Q12H   spironolactone  12.5 mg Oral Daily    Infusions:  sodium chloride     amiodarone     amiodarone     heparin 1,250 Units/hr (03/18/23 1702)   sodium chloride Stopped (03/13/23 0305)    PRN Medications: sodium chloride, acetaminophen, ALPRAZolam, loperamide, melatonin, ondansetron (ZOFRAN) IV, polyethylene glycol, prochlorperazine, sodium chloride flush, sodium chloride flush  Assessment/Plan   1. Acute on chronic systolic CHF:  Echo this admission with EF 35-40%, moderately decreased RV systolic function.  RHC this admission with biventricular elevated filling pressures, low PAPi but preserved CI.  Pulmonary venous hypertension.  Suspect ischemic cardiomyopathy + component of cor pulmonale from COPD.  She is volume overloaded still with CVP 18, creatinine 1.36 => 1.57 with diuresis yesterday.  Weight is down 5 lbs.   - Lasix 80 mg IV bid and will give a dose of metolazone 2.5 x 1 again today.  Renal function may limit further aggressive diuresis if it continues to worsen.  - Co-ox inaccurate, will repeat.  - With significant volume overload, would not increase Toprol XL.  Keep at 50 mg daily.  - Farxiga 10 daily - spironolactone 12.5 daily - hydralazine 25 tid + Imdur 30 for now.  She has significant bilateral renal artery stenosis by prior CTA but renal artery doppler does not suggest hemodynamic significance.  Could probably transition to Entresto in the future but would like to see stable creatinine.  2. CKD stage 3: Creatinine higher 1.36 => 1.57.  Follow closely with diuresis.   3. Atrial fibrillation: History of PAF.  AF this admission, was in NSR in 5/24.  Probably not a good long-term amiodarone candidate with severe COPD on home oxygen.  Currently, HR is  uncontrolled in 120s-130s despite receiving 100 mg Toprol XL today.  - As above, would keep Toprol XL at 50 mg daily with significant volume overload.  - I am going to add amiodarone gtt for now for rate control.  I will not continue this medication long-term given her lung disease.  - Continue anticoagulation (currently on heparin gtt, eventually restart Eliquis).   - Once she is diuresed, arrange for TEE-guided DCCV (?Friday). Discussed risks/benefits with patient and she agrees to procedure.  - May be a candidate for Tikosyn in future to maintain NSR.  4. CAD s/p CABG: Cath 7/2 with patent grafts.  No ACS. No chest pain.  - Continue statin.  5. COPD: Severe, on home oxygen.  6. Renal artery stenosis: By CTA.  Renal artery dopplers suggested 1-59% bilateral ICA stenosis.   7. Mesenteric artery stenosis: No intestinal angina reported.   Riaan Toledo 03/19/2023 10:35 AM 

## 2023-03-20 ENCOUNTER — Encounter (HOSPITAL_COMMUNITY)
Admission: EM | Disposition: A | Discharge status: Home / Self Care | Payer: Self-pay | Source: Home / Self Care | Attending: Internal Medicine

## 2023-03-20 ENCOUNTER — Inpatient Hospital Stay (HOSPITAL_COMMUNITY): Admit: 2023-03-20 | Payer: Medicare HMO

## 2023-03-20 ENCOUNTER — Inpatient Hospital Stay (HOSPITAL_COMMUNITY): Payer: Medicare HMO | Admitting: Anesthesiology

## 2023-03-20 DIAGNOSIS — I5043 Acute on chronic combined systolic (congestive) and diastolic (congestive) heart failure: Secondary | ICD-10-CM

## 2023-03-20 DIAGNOSIS — I4891 Unspecified atrial fibrillation: Secondary | ICD-10-CM

## 2023-03-20 DIAGNOSIS — I340 Nonrheumatic mitral (valve) insufficiency: Secondary | ICD-10-CM

## 2023-03-20 DIAGNOSIS — I110 Hypertensive heart disease with heart failure: Secondary | ICD-10-CM

## 2023-03-20 DIAGNOSIS — I361 Nonrheumatic tricuspid (valve) insufficiency: Secondary | ICD-10-CM

## 2023-03-20 DIAGNOSIS — I2510 Atherosclerotic heart disease of native coronary artery without angina pectoris: Secondary | ICD-10-CM | POA: Diagnosis not present

## 2023-03-20 DIAGNOSIS — Z87891 Personal history of nicotine dependence: Secondary | ICD-10-CM

## 2023-03-20 HISTORY — PX: TEE WITHOUT CARDIOVERSION: SHX5443

## 2023-03-20 HISTORY — PX: CARDIOVERSION: SHX1299

## 2023-03-20 LAB — ECHO TEE

## 2023-03-20 LAB — CBC
HCT: 31.9 % — ABNORMAL LOW (ref 36.0–46.0)
Hemoglobin: 9.9 g/dL — ABNORMAL LOW (ref 12.0–15.0)
MCH: 33.1 pg (ref 26.0–34.0)
MCHC: 31 g/dL (ref 30.0–36.0)
MCV: 106.7 fL — ABNORMAL HIGH (ref 80.0–100.0)
Platelets: 169 10*3/uL (ref 150–400)
RBC: 2.99 MIL/uL — ABNORMAL LOW (ref 3.87–5.11)
RDW: 15.7 % — ABNORMAL HIGH (ref 11.5–15.5)
WBC: 4.2 10*3/uL (ref 4.0–10.5)
nRBC: 0.7 % — ABNORMAL HIGH (ref 0.0–0.2)

## 2023-03-20 LAB — URINALYSIS, ROUTINE W REFLEX MICROSCOPIC
Bilirubin Urine: NEGATIVE
Glucose, UA: NEGATIVE mg/dL
Hgb urine dipstick: NEGATIVE
Ketones, ur: NEGATIVE mg/dL
Nitrite: POSITIVE — AB
Protein, ur: NEGATIVE mg/dL
Specific Gravity, Urine: 1.011 (ref 1.005–1.030)
pH: 5 (ref 5.0–8.0)

## 2023-03-20 LAB — BASIC METABOLIC PANEL
Anion gap: 14 (ref 5–15)
BUN: 25 mg/dL — ABNORMAL HIGH (ref 8–23)
CO2: 32 mmol/L (ref 22–32)
Calcium: 8.7 mg/dL — ABNORMAL LOW (ref 8.9–10.3)
Chloride: 90 mmol/L — ABNORMAL LOW (ref 98–111)
Creatinine, Ser: 1.88 mg/dL — ABNORMAL HIGH (ref 0.44–1.00)
GFR, Estimated: 28 mL/min — ABNORMAL LOW (ref >60)
Glucose, Bld: 103 mg/dL — ABNORMAL HIGH (ref 70–99)
Potassium: 3.4 mmol/L — ABNORMAL LOW (ref 3.5–5.1)
Sodium: 136 mmol/L (ref 135–145)

## 2023-03-20 LAB — COOXEMETRY PANEL
Carboxyhemoglobin: 1.7 % — ABNORMAL HIGH (ref 0.5–1.5)
Methemoglobin: <0.7 % (ref 0.0–1.5)
O2 Saturation: 65.6 %
Total hemoglobin: 10.4 g/dL — ABNORMAL LOW (ref 12.0–16.0)

## 2023-03-20 LAB — HEPARIN LEVEL (UNFRACTIONATED): Heparin Unfractionated: 0.4 IU/mL (ref 0.30–0.70)

## 2023-03-20 LAB — SODIUM, URINE, RANDOM: Sodium, Ur: 86 mmol/L

## 2023-03-20 SURGERY — ECHOCARDIOGRAM, TRANSESOPHAGEAL
Anesthesia: Monitor Anesthesia Care

## 2023-03-20 MED ORDER — METOPROLOL SUCCINATE ER 25 MG PO TB24
25.0000 mg | ORAL_TABLET | Freq: Every day | ORAL | Status: DC
  Administered 2023-03-20 – 2023-03-21 (×2): 25 mg via ORAL
  Filled 2023-03-20 (×2): qty 1

## 2023-03-20 MED ORDER — PHENYLEPHRINE 80 MCG/ML (10ML) SYRINGE FOR IV PUSH (FOR BLOOD PRESSURE SUPPORT)
PREFILLED_SYRINGE | INTRAVENOUS | Status: DC | PRN
  Administered 2023-03-20 (×2): 80 ug via INTRAVENOUS

## 2023-03-20 MED ORDER — PROPOFOL 10 MG/ML IV BOLUS
INTRAVENOUS | Status: DC | PRN
  Administered 2023-03-20 (×2): 10 mg via INTRAVENOUS

## 2023-03-20 MED ORDER — SODIUM CHLORIDE 0.45 % IV SOLN: INTRAVENOUS | Status: DC

## 2023-03-20 MED ORDER — EPHEDRINE SULFATE (PRESSORS) 50 MG/ML IJ SOLN
INTRAMUSCULAR | Status: DC | PRN
  Administered 2023-03-20: 10 mg via INTRAVENOUS
  Administered 2023-03-20: 5 mg via INTRAVENOUS

## 2023-03-20 MED ORDER — AMIODARONE HCL 200 MG PO TABS
200.0000 mg | ORAL_TABLET | Freq: Every day | ORAL | Status: DC
  Administered 2023-03-20 – 2023-03-25 (×6): 200 mg via ORAL
  Filled 2023-03-20 (×6): qty 1

## 2023-03-20 MED ORDER — PROPOFOL 500 MG/50ML IV EMUL
INTRAVENOUS | Status: DC | PRN
  Administered 2023-03-20: 100 ug/kg/min via INTRAVENOUS

## 2023-03-20 MED ORDER — POTASSIUM CHLORIDE CRYS ER 20 MEQ PO TBCR
40.0000 meq | EXTENDED_RELEASE_TABLET | Freq: Once | ORAL | Status: AC
  Administered 2023-03-20: 40 meq via ORAL
  Filled 2023-03-20: qty 2

## 2023-03-20 MED ORDER — LIDOCAINE 2% (20 MG/ML) 5 ML SYRINGE
INTRAMUSCULAR | Status: DC | PRN
  Administered 2023-03-20: 80 mg via INTRAVENOUS

## 2023-03-20 SURGICAL SUPPLY — 1 items: ELECT DEFIB PAD ADLT CADENCE (PAD) ×1 IMPLANT

## 2023-03-20 NOTE — TOC Initial Note (Signed)
Transition of Care [facility]) - Initial/Assessment Note    Patient Details  Name: Monica Zamora MRN: [id number] Date of Birth: Jan 29, 1951  [facility] CM/SW Contact:    Elliot Cousin, RN Phone Number: [phone] 03/20/2023, 4:48 PM  Clinical Narrative:   HF TOC CM spoke to pt and family at bedside. Pt states her Pulm sent referral to [employer] for lightweight POC. Contacted Mitch with [employer] and states he will follow up. States she needs nebulizer machine and humidification for oxygen. Notified [employer] rep, Mitch with new orders. Email sent to rep to contact family to reassure DME has been ordered and will be delivered. Offered choice for [facility], pt agreeable to [facility] for HH. Contacted [facility] rep, Tresa Endo with new referral.                 Expected Discharge Plan: Home w Home Health Services Barriers to Discharge: Continued Medical Work up   Patient Goals and CMS Choice Patient states their goals for this hospitalization and ongoing recovery are:: wants to remain independent CMS Medicare.gov Compare Post Acute Care list provided to:: Patient Choice offered to / list presented to : Patient      Expected Discharge Plan and Services   Discharge Planning Services: CM Consult Post Acute Care Choice: Home Health Living arrangements for the past 2 months: Single Family Home                 DME Arranged: Nebulizer machine         HH Arranged: RN, PT, OT HH Agency: [facility] Date [facility] Agency Contacted: 03/18/23 Time HH Agency Contacted: 1653 Representative spoke with at [facility] Agency: Tresa Endo  Prior Living Arrangements/Services Living arrangements for the past 2 months: Single Family Home Lives with:: Self Patient language and need for interpreter reviewed:: Yes Do you feel safe going back to the place where you live?: Yes      Need for Family Participation in Patient Care: Yes (Comment) Care giver support system in place?: Yes (comment) Current  home services: DME (oxygen -[employer] [facility], rollator, shower chair) Criminal Activity/Legal Involvement Pertinent to Current Situation/Hospitalization: No - Comment as needed  Activities of Daily Living Home Assistive Devices/Equipment: None ADL Screening (condition at time of admission) Patient's cognitive ability adequate to safely complete daily activities?: Yes Is the patient deaf or have difficulty hearing?: No Does the patient have difficulty seeing, even when wearing glasses/contacts?: No Does the patient have difficulty concentrating, remembering, or making decisions?: No Patient able to express need for assistance with ADLs?: Yes Does the patient have difficulty dressing or bathing?: No Independently performs ADLs?: Yes (appropriate for developmental age) Does the patient have difficulty walking or climbing stairs?: No Weakness of Legs: None Weakness of Arms/Hands: None  Permission Sought/Granted                  Emotional Assessment   Attitude/Demeanor/Rapport: Engaged Affect (typically observed): Accepting Orientation: : Oriented to Self, Oriented to Place, Oriented to  Time, Oriented to Situation      Admission diagnosis:  Elevated troponin I level [R79.89] AKI (acute kidney injury) (HCC) [N17.9] Acute on chronic systolic (congestive) heart failure (HCC) [I50.23] Patient Active Problem List   Diagnosis Date Noted   Acute on chronic combined systolic and diastolic CHF (congestive heart failure) (HCC) 03/13/2023   COPD exacerbation (HCC) 08/09/2021   AF (paroxysmal atrial fibrillation) (HCC) 08/08/2021   COPD with acute exacerbation (HCC) 08/08/2021   Hypokalemia 08/08/2021  CAD (coronary artery disease) 08/08/2021   Essential hypertension 08/08/2021   Hyperlipidemia 08/08/2021   Elevated troponin 08/08/2021   Acute respiratory failure with hypoxemia (HCC) 08/08/2021   Thrombocytopenia (HCC) 08/08/2021   PCP:  Rebecka Apley, NP Pharmacy:   [facility] - [place], [place] - [address] [place] [place] [address] Phone: [phone] Fax: [phone]  [facility] [address] [place] [address] Phone: [phone] Fax: [phone]     Social Determinants of Health (SDOH) Social History: SDOH Screenings   Food Insecurity: No Food Insecurity (03/13/2023)  Housing: Low Risk  (03/13/2023)  Transportation Needs: No Transportation Needs (03/13/2023)  Alcohol Screen: Low Risk  (03/13/2023)  Depression (PHQ2-9): Low Risk  (06/24/2021)  Recent Concern: Depression (PHQ2-9) - Medium Risk (05/06/2021)  Financial Resource Strain: Low Risk  (03/13/2023)  Tobacco Use: Medium Risk (03/18/2023)   SDOH Interventions: Food Insecurity Interventions: Intervention Not Indicated Housing Interventions: Intervention Not Indicated Transportation Interventions: Intervention Not Indicated Alcohol Usage Interventions: Intervention Not Indicated (Score <7) Financial Strain Interventions: Intervention Not Indicated   Readmission Risk Interventions     No data to display

## 2023-03-20 NOTE — Progress Notes (Signed)
Spoke with patient regarding signing consent for TEE procedure, stated she wanted to speak with the MD prior to signing. This RN will pass along to day shift RN to page MD to speak with patient prior to procedure.

## 2023-03-20 NOTE — CV Procedure (Signed)
Procedure: TEE  Indication: Atrial fibrillation  Sedation: Per anesthesiology  Findings: Please see echo section for full report.  Mildly dilated LV with global hypokinesis, EF 30-35%.  Mildly dilated RV with moderate systolic dysfunction.  Mild left atrial enlargement, no LA appendage present (appears to have been clipped at time of CABG).  No LA thrombus.  Mild right atrial enlargement.  Lipomatous atrial septal hypertrophy.  Moderate-severe TR with dilated IVC.  Mild mitral regurgitation.  Trileaflet aortic valve with no stenosis or regurgitation.  Normal caliber thoracic aorta with grade 3 plaque.    May proceed with DCCV.   [name] 03/20/2023 8:11 AM

## 2023-03-20 NOTE — Anesthesia Postprocedure Evaluation (Signed)
Anesthesia Post Note  Patient: EMAROSA KALT  Procedure(s) Performed: TRANSESOPHAGEAL ECHOCARDIOGRAM CARDIOVERSION     Patient location during evaluation: PACU Anesthesia Type: General Level of consciousness: awake and alert Pain management: pain level controlled Vital Signs Assessment: post-procedure vital signs reviewed and stable Respiratory status: spontaneous breathing, nonlabored ventilation and respiratory function stable Cardiovascular status: blood pressure returned to baseline and stable Postop Assessment: no apparent nausea or vomiting Anesthetic complications: no   No notable events documented.  Last Vitals:  Vitals:   03/20/23 0909 03/20/23 1126  BP: (!) 103/54 (!) 100/49  Pulse: 62 60  Resp: 20 20  Temp: 36.9 C 36.6 C  SpO2: 98% 96%    Last Pain:  Vitals:   03/20/23 1126  TempSrc: Oral  PainSc:                  Beryle Lathe

## 2023-03-20 NOTE — Progress Notes (Signed)
Physical Therapy Treatment Patient Details Name: Monica Zamora MRN: [id number] DOB: [birth year] Today's Date: 03/20/2023   History of Present Illness 72 yo female admitted 6/28 with chest pain, bradycardia, hypotension and acute on chronic CHF. PMHx: Afib on Eliquis, HTN, respiratory failure on 2L, CAD s/p CABG, HFrEF    PT Comments  Pt received in supine and agreeable to session. Pt reports feeling better after cardioversion this morning. Pt able to progress gait distance this session with min guard for safety and assist with line management. Pt reports not ambulating much prior to admission and demonstrates decreased activity tolerance.  Pt continues to benefit from PT services to progress toward functional mobility goals.     Assistance Recommended at Discharge Intermittent Supervision/Assistance  If plan is discharge home, recommend the following:  Can travel by private vehicle    A little help with bathing/dressing/bathroom;Assistance with cooking/housework;Assist for transportation      Equipment Recommendations  BSC/3in1    Recommendations for Other Services       Precautions / Restrictions Precautions Precautions: Fall;Other (comment) Precaution Comments: watch sats Restrictions Weight Bearing Restrictions: No     Mobility  Bed Mobility Overal bed mobility: Modified Independent                  Transfers Overall transfer level: Needs assistance Equipment used: Rolling walker (2 wheels) Transfers: Sit to/from Stand Sit to Stand: Min guard           General transfer comment: from EOB and recliner with min guard for safety. cues for hand placement    Ambulation/Gait Ambulation/Gait assistance: Min guard Gait Distance (Feet): 80 Feet Assistive device: Rolling walker (2 wheels) Gait Pattern/deviations: Step-through pattern, Decreased stride length       General Gait Details: min guard for safety. Pt demonstrating step-through pattern with  upright posture and light UE support on RW for balance.       Balance Overall balance assessment: Needs assistance Sitting-balance support: Bilateral upper extremity supported, Feet supported Sitting balance-Leahy Scale: Good Sitting balance - Comments: sitting EOB   Standing balance support: During functional activity, Bilateral upper extremity supported Standing balance-Leahy Scale: Fair Standing balance comment: with RW support                            Cognition Arousal/Alertness: Awake/alert Behavior During Therapy: WFL for tasks assessed/performed Overall Cognitive Status: Within Functional Limits for tasks assessed                                          Exercises      General Comments General comments (skin integrity, edema, etc.): VSS      Pertinent Vitals/Pain Pain Assessment Pain Assessment: No/denies pain     PT Goals (current goals can now be found in the care plan section) Acute Rehab PT Goals Patient Stated Goal: return to vacuuming and walking more PT Goal Formulation: With patient Time For Goal Achievement: 03/27/23 Potential to Achieve Goals: Good Progress towards PT goals: Progressing toward goals    Frequency    Min 1X/week      PT Plan Current plan remains appropriate       AM-PAC PT "6 Clicks" Mobility   Outcome Measure  Help needed turning from your back to your side while in a flat bed without using bedrails?: None Help needed moving  from lying on your back to sitting on the side of a flat bed without using bedrails?: None Help needed moving to and from a bed to a chair (including a wheelchair)?: A Little Help needed standing up from a chair using your arms (e.g., wheelchair or bedside chair)?: A Little Help needed to walk in hospital room?: A Little Help needed climbing 3-5 steps with a railing? : A Little 6 Click Score: 20    End of Session Equipment Utilized During Treatment: Gait  belt Activity Tolerance: Patient tolerated treatment well Patient left: with call bell/phone within reach;in chair Nurse Communication: Mobility status PT Visit Diagnosis: Other abnormalities of gait and mobility (R26.89);Difficulty in walking, not elsewhere classified (R26.2)     Time: 1610-9604 PT Time Calculation (min) (ACUTE ONLY): 20 min  Charges:    $Gait Training: 8-22 mins PT General Charges $$ ACUTE PT VISIT: 1 Visit                     Johny Shock, PTA Acute Rehabilitation Services Secure Chat Preferred  [phone]    Johny Shock 03/20/2023, 1:06 PM

## 2023-03-20 NOTE — Transfer of Care (Signed)
Immediate Anesthesia Transfer of Care Note  Patient: Monica Zamora  Procedure(s) Performed: TRANSESOPHAGEAL ECHOCARDIOGRAM CARDIOVERSION  Patient Location: PACU  Anesthesia Type:General  Level of Consciousness: awake, drowsy, and patient cooperative  Airway & Oxygen Therapy: Patient Spontanous Breathing and Patient connected to nasal cannula oxygen  Post-op Assessment: Report given to RN, Post -op Vital signs reviewed and stable, and Patient moving all extremities  Post vital signs: Reviewed and stable  Last Vitals:  Vitals Value Taken Time  BP 81/40 03/20/23 0810  Temp 36.6 C 03/20/23 0810  Pulse 50 03/20/23 0813  Resp 22 03/20/23 0813  SpO2 93 % 03/20/23 0813  Vitals shown include unvalidated device data.  Last Pain:  Vitals:   03/20/23 0810  TempSrc: Temporal  PainSc: 0-No pain         Complications: No notable events documented.

## 2023-03-20 NOTE — Anesthesia Preprocedure Evaluation (Addendum)
Anesthesia Evaluation  Patient identified by MRN, date of birth, ID band Patient awake    Reviewed: Allergy & Precautions, NPO status , Patient's Chart, lab work & pertinent test results, reviewed documented beta blocker date and time   History of Anesthesia Complications Negative for: history of anesthetic complications  Airway Mallampati: III  TM Distance: >3 FB Neck ROM: Full    Dental  (+) Dental Advisory Given, Chipped, Poor Dentition   Pulmonary COPD,  COPD inhaler and oxygen dependent, former smoker    + decreased breath sounds+ wheezing      Cardiovascular hypertension, Pt. on medications and Pt. on home beta blockers pulmonary hypertension+ CAD, + CABG and +CHF  + dysrhythmias Atrial Fibrillation  Rhythm:Irregular Rate:Normal   '24 TTE - EF 35 to 40%. Global hypokinesis. There is mild left ventricular hypertrophy. There is moderately elevated pulmonary artery systolic pressure. The estimated right ventricular systolic pressure is 52.2 mmHg. Mild mitral valve regurgitation. Tricuspid valve regurgitation is moderate. Aortic valve sclerosis     Neuro/Psych negative neurological ROS  negative psych ROS   GI/Hepatic Neg liver ROS, PUD,,,  Endo/Other  negative endocrine ROS    Renal/GU Renal InsufficiencyRenal disease     Musculoskeletal negative musculoskeletal ROS (+)    Abdominal   Peds  Hematology  (+) Blood dyscrasia, anemia   Anesthesia Other Findings   Reproductive/Obstetrics                             Anesthesia Physical Anesthesia Plan  ASA: 3  Anesthesia Plan: MAC and General   Post-op Pain Management:    Induction:   PONV Risk Score and Plan: 3 and Propofol infusion and Treatment may vary due to age or medical condition  Airway Management Planned: Nasal Cannula and Natural Airway  Additional Equipment: None  Intra-op Plan:   Post-operative Plan:   Informed  Consent: I have reviewed the patients History and Physical, chart, labs and discussed the procedure including the risks, benefits and alternatives for the proposed anesthesia with the patient or authorized representative who has indicated his/her understanding and acceptance.     Dental advisory given  Plan Discussed with: CRNA and Anesthesiologist  Anesthesia Plan Comments: (May begin procedure as MAC with conversion to GA as indicated by procedure )        Anesthesia Quick Evaluation

## 2023-03-20 NOTE — Progress Notes (Signed)
PROGRESS NOTE  Monica Zamora ZHY:[id number] DOB: [birth year] DOA: 03/13/2023 PCP: Rebecka Apley, NP  Brief History   Patient is a 72 year old female, extensive vasculopath without noted significant workup, with history of coronary artery disease status post CABG, hypertension and atrial fibrillation. Patient was admitted with lower sternal/epigastric pain.  Chest pain was said to be radiating to the neck. Associated nausea and vomiting.  Non rising troponin.  EKG revealed A-fib with nonspecific T wave changes.  The patient underwent left and right heart catheterizations on 03/17/2023.  It demonstrated:   Severe combined CHF with severely elevated right-sided pressures: RAP mean 24 mmHg with large mild wave.; RV P-EDP 57/8-17 mmHg. PAP-mean 6/37-42 mmHg; PCWP 35 mmHg with V wave up to 43. Significant respiratory variation. LV P-EDP 118/16-22 mmHg, AO P-MAP 106/73-79 mmHg Ao sat 96, PA sat 54%. CO-CI (Fick) 4.51-2.35; PAPI 0.86 Significant native vessel CAD with 3 of 3 patent grafts: 100% proximal RCA CTO; 100% mLCx; Prox LAD 60% & mLAD ~70% (competetive flow from LIMA-LAD) Patent LIMA-LAD, SVG-PDA, SVG-OM 3 Cardiology's recommendations are for continued diuresis with IV lasix and heart failure service consult due to severe RV failure. She will be continued on IV heparin for now, but will be converted back to DOAC once invasive procedures are completed.   CTA chest/abdomen/pelvis revealed: 1. No evidence of thoracoabdominal aortic aneurysm or dissection. 2. No pulmonary embolism identified. 3. Extensive multi-vessel coronary artery calcification, status post coronary artery bypass grafting. 4. Morphologic changes in keeping with pulmonary arterial hypertension and right heart failure with retrograde opacification of the hepatic venous system. 5. Mosaic attenuation within the pulmonary parenchyma and bronchial wall thickening in keeping with airway inflammation and multifocal air  trapping. 6. 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 Clinical correlation for signs and symptoms of chronic mesenteric ischemia may be helpful. 7. Diffusely diminutive renal arteries bilaterally. Superimposed focal hemodynamically significant stenosis of the right renal artery origin. 8. Peripheral vascular disease with hemodynamically significant stenoses of the common iliac artery origins bilaterally and diffusely diseased lower extremity arterial inflow vasculature. 9. Morphologic changes suggesting cirrhosis. This could be confirmed with hepatic elastography or tissue sampling. Mild ascites.   Aortic Atherosclerosis     03/18/2023: Creatinine has modestly increased today to 1.36. Will continue to monitor renal status. Cardiology has requested a triple lumen catheter today to follow CVP. Chest pain is felt to be due to decompensated heart failure. Her lasix has been increased to 80 mg IV bid. Recommendations include farxiga 10 mg daily, spironolactone 12.5 daily, Imdur 30 mg daily, and hydralazine 25 mg tid. Consider entresto after Doppler of renal artery is concluded. Intervention may be considered if stenosis is severe or blood pressures are effected. Metoprolol has been changed from tartrate to 50 mg succinate . Heparin will be switched to eliquis once the procedures are complete.   03/19/2023: No new complaints.  Was normal serum creatinine.  Repeat serum creatinine in the morning.  Patient is at risk of contrast associated nephropathy.  03/20/2023: Patient underwent TEE cardioversion today.  Worsening renal function is noted, suspect contrast associated nephropathy.  Check urinalysis and urine sodium.  Serum creatinine has gone from 1.57-1.88.  Monitor renal function daily.  Cautious use of diuretics.  Objective   Vitals:  Vitals:   03/20/23 0836 03/20/23 0909  BP: (!) 105/50 (!) 103/54  Pulse: (!) 53 62  Resp: (!) 24 20  Temp:   98.4 F (36.9 C)  SpO2: 96% 98%    Exam:  Constitutional:  The patient is awake, alert, and oriented x 3. No acute distress. Respiratory:  Decreased air entry globally. Cardiovascular:  S1-S2.Marland Kitchen Abdomen:  Abdomen is obese, soft, non-tender. Musculoskeletal:  Leg edema is improving.   Neurologic: Awake and alert.  Patient was all extremities.  S   I have personally reviewed the following:    Scheduled Meds:  amiodarone  200 mg Oral Daily   aspirin EC  81 mg Oral Daily   atorvastatin  40 mg Oral Daily   Chlorhexidine Gluconate Cloth  6 each Topical Daily   dapagliflozin propanediol  10 mg Oral Daily   furosemide  80 mg Intravenous BID   metoprolol succinate  25 mg Oral Daily   potassium chloride  40 mEq Oral Once   sertraline  100 mg Oral Daily   sodium chloride flush  10-40 mL Intracatheter Q12H   sodium chloride flush  3 mL Intravenous Q12H   sodium chloride flush  3 mL Intravenous Q12H   spironolactone  12.5 mg Oral Daily   Continuous Infusions:  sodium chloride     heparin 1,250 Units/hr (03/20/23 0956)   sodium chloride Stopped (03/13/23 0305)    Principal Problem:   Acute on chronic combined systolic and diastolic CHF (congestive heart failure) (HCC) Active Problems:   AF (paroxysmal atrial fibrillation) (HCC)   CAD (coronary artery disease)  A & P  Principal Problem:   Acute on chronic systolic (congestive) heart failure (HCC)   Acute on chronic HFrEF 40-45% -Last 2D echo done on 03/04/2023 revealed LVEF 40-45%, left ventricle global hypokinesis -Patient presented with volume overload on exam, right JVD, bilateral lower extremity pitting edema, BNP greater than 1600 -IV Lasix.   -Start strict I's and O's and daily weight -LHC completed 7/2. Results above.   Chest pain:  Elevated troponin: Troponin flat, 39, repeat 40 For possible left and right heart catheterization tomorrow. Continue to monitor renal function.   -LHC completed 7/2. Results  above.   AKI on CKD 3A: -Baseline creatinine appears to be 1.1 with GFR 51 -Presented with creatinine of 1.64 with GFR of 33 -AKI is likely multifactorial, including, but not limited to ATN/cardiorenal syndrome/exposure to contrast. -Aggressive diuresis. -Avoid hypotension. -Avoid nephrotoxic medications. -Keep MAP greater than 65 mmHg. -Strict I's and O's. -Dose all medications assuming GFR of less than 50 mL/min. -Serum creatinine improved from 1.88 to 1.36 today. 03/19/2023: Cautious use of diuretics.  Serum creatinine of 1.57 noted today. 03/20/2023: Serum creatinine is 1.88 today.  Suspect contrast associated nephropathy.  Check urinalysis and urine sodium.   Elevated liver chemistries, unclear etiology, suspect related to alcohol use -Initially elevated AST, but back to normal. -Continue to monitor for now -Avoid hepatotoxic agents -Patient has a history of heavy alcohol use. She states that she has not had a drink for 2 months.   Permanent atrial fibrillation, currently rate controlled, on Eliquis Initially presented hypotensive and bradycardic, home beta-blocker was reversed with glucagon Resume home Eliquis Closely monitor on telemetry Rate control with metoprolol. Anticipate transition to toprol for discharge.  Renal artery stenosis: On CTa nd ultrasound. Doppler of renal arteries pending. Consider entresto if appropriate.   Chronic anxiety/depression Resume home Zoloft   Chronic hypoxic respiratory failure on 2 L nasal cannula continuously Appears to be at her baseline oxygen requirement Maintain O2 saturation above 90%   Extensive vascular disease: -Vascular surgery team has been consulted. -Abdominal pain on presentation, but with normal  lactic acid.   DVT prophylaxis: Heparin drip. Code Status: Full code. Family Communication:  Disposition Plan: This will depend on hospital course.     Consultants:  Cardiology.   Procedures:  Cardiac catheterization     Antimicrobials:  None.  Berton Mount, MD [facility] Direct contact: see [email or web address]  7PM-7AM contact night coverage as above 03/18/2023, 7:24 PM  LOS: 4 days     LOS: 7 days

## 2023-03-20 NOTE — Procedures (Signed)
Electrical Cardioversion Procedure Note Monica Zamora [id number] [birth year]  Procedure: Electrical Cardioversion Indications:  Atrial Fibrillation  Procedure Details Consent: Risks of procedure as well as the alternatives and risks of each were explained to the (patient/caregiver).  Consent for procedure obtained. Time Out: Verified patient identification, verified procedure, site/side was marked, verified correct patient position, special equipment/implants available, medications/allergies/relevent history reviewed, required imaging and test results available.  Performed  Patient placed on cardiac monitor, pulse oximetry, supplemental oxygen as necessary.  Sedation given:  Propofol per anesthesiology Pacer pads placed anterior and posterior chest.  Cardioverted 1 time(s).  Cardioverted at 200J.  Evaluation Findings: Post procedure EKG shows: NSR Complications: None Patient did tolerate procedure well.   Monica Zamora 03/20/2023, 8:12 AM

## 2023-03-20 NOTE — Interval H&P Note (Signed)
History and Physical Interval Note:  03/20/2023 7:42 AM  Monica Zamora  has presented today for surgery, with the diagnosis of afib.  The various methods of treatment have been discussed with the patient and family. After consideration of risks, benefits and other options for treatment, the patient has consented to  Procedure(s): TRANSESOPHAGEAL ECHOCARDIOGRAM (N/A) CARDIOVERSION (N/A) as a surgical intervention.  The patient's history has been reviewed, patient examined, no change in status, stable for surgery.  I have reviewed the patient's chart and labs.  Questions were answered to the patient's satisfaction.     Jarah Pember [employer]

## 2023-03-20 NOTE — Progress Notes (Signed)
ANTICOAGULATION CONSULT NOTE  Pharmacy Consult for Eliquis >> IV heparin Indication: atrial fibrillation  No Known Allergies  Patient Measurements: Height: 5\' 6"  (167.6 cm) Weight: 75 kg (165 lb 5.5 oz) IBW/kg (Calculated) : 59.3 Heparin Dosing Weight: 76.2 kg  Vital Signs: Temp: 98.4 F (36.9 C) (07/05 0909) Temp Source: Oral (07/05 0909) BP: 103/54 (07/05 0909) Pulse Rate: 62 (07/05 0909)  Labs: Recent Labs    03/18/23 0430 03/19/23 0432 03/19/23 0515 03/20/23 0519  HGB 9.1* 10.7*  --  9.9*  HCT 29.3* 34.9*  --  31.9*  PLT 153 198  --  169  APTT 97*  --   --   --   HEPARINUNFRC 0.51  --  0.36 0.40  CREATININE 1.36* 1.57*  --  1.88*     Estimated Creatinine Clearance: 28.4 mL/min (A) (by C-G formula based on SCr of 1.88 mg/dL (H)).   Assessment: 72 yo female presented with chest pain, epigastric pain, and emesis. Patient is on Eliquis 5mg  BID PTA for hx Afib. Eliquis was resumed on admission and last dose was 6/28 @ 0900. Pharmacy has been consulted to transition Eliquis to IV heparin with plans for cardiac cath.  Now s/p cath 7/2. Heparin resumed. TEE/DCCV today. Heparin level therapeutic at 0.4, CBC stable.  Goal of Therapy:  Heparin level 0.3 to 0.7 Monitor platelets by anticoagulation protocol: Yes   Plan:  Cont heparin 1250 units/hr Daily heparin level / CBC   Fredonia Highland, PharmD, BCPS, [facility] Clinical Pharmacist [phone] Please check AMION for all [facility] Pharmacy numbers 03/20/2023   "Be curious, not judgmental..." -Debbora Dus

## 2023-03-20 NOTE — Anesthesia Procedure Notes (Signed)
Procedure Name: MAC Date/Time: 03/20/2023 7:48 AM  Performed by: Colon Flattery, CRNAPre-anesthesia Checklist: Patient identified, Emergency Drugs available, Suction available and Patient being monitored Patient Re-evaluated:Patient Re-evaluated prior to induction Oxygen Delivery Method: Nasal cannula Preoxygenation: Pre-oxygenation with 100% oxygen Induction Type: IV induction Placement Confirmation: positive ETCO2 Dental Injury: Teeth and Oropharynx as per pre-operative assessment

## 2023-03-20 NOTE — Progress Notes (Signed)
Pt came back to rm 2 from cath lab. Reinitiated tele. VSS. Call bell within reach.  ° °Monica Zamora S Shakiya Mcneary, RN ° °

## 2023-03-20 NOTE — Progress Notes (Signed)
Patient ID: Monica Zamora, female   DOB: 28-Sep-1950, 72 y.o.   MRN: [id number]     Advanced Heart Failure Rounding Note  PCP-Cardiologist: Olga Millers, MD   Subjective:    Patient had TEE-DCCV today, now back in NSR with HR 40s after DCCV.  TEE showed EF 30-35%, moderate RV dysfunction, mod-severe TR, IVC dilated.  Creatinine 1.36 => 1.57 => 1.88.  I/Os net negative 2340, beathing better.   Objective:   Weight Range: 75 kg Body mass index is 26.69 kg/m.   Vital Signs:   Temp:  [97.8 F (36.6 C)-98.3 F (36.8 C)] 97.8 F (36.6 C) (07/05 0810) Pulse Rate:  [49-100] 49 (07/05 0815) Resp:  [18-20] 18 (07/05 0815) BP: (81-109)/(40-77) 87/43 (07/05 0815) SpO2:  [93 %-99 %] 93 % (07/05 0815) Weight:  [75 kg] 75 kg (07/05 0629) Last BM Date : 03/18/23  Weight change: Filed Weights   03/18/23 0332 03/19/23 0410 03/20/23 0629  Weight: 79.7 kg 77.2 kg 75 kg    Intake/Output:   Intake/Output Summary (Last 24 hours) at 03/20/2023 0818 Last data filed at 03/20/2023 0801 Gross per 24 hour  Intake 960 ml  Output 3200 ml  Net -2240 ml      Physical Exam    General: NAD Neck: JVP 14 cm, no thyromegaly or thyroid nodule.  Lungs: Clear to auscultation bilaterally with normal respiratory effort. CV: Nondisplaced PMI.  Heart irregular S1/S2, no S3/S4, no murmur.  Trace ankle edema.  Abdomen: Soft, nontender, no hepatosplenomegaly, no distention.  Skin: Intact without lesions or rashes.  Neurologic: Alert and oriented x 3.  Psych: Normal affect. Extremities: No clubbing or cyanosis.  HEENT: Normal.    Telemetry   Afib 90s => NSR with DCCV (personally reviewed)  Labs    CBC Recent Labs    03/19/23 0432 03/20/23 0519  WBC 4.9 4.2  HGB 10.7* 9.9*  HCT 34.9* 31.9*  MCV 107.4* 106.7*  PLT 198 169   Basic Metabolic Panel Recent Labs    40/98/11 0432 03/20/23 0519  NA 140 136  K 4.6 3.4*  CL 98 90*  CO2 30 32  GLUCOSE 108* 103*  BUN 21 25*  CREATININE  1.57* 1.88*  CALCIUM 9.4 8.7*  MG 1.8  --    Liver Function Tests No results for input(s): "AST", "ALT", "ALKPHOS", "BILITOT", "PROT", "ALBUMIN" in the last 72 hours.  No results for input(s): "LIPASE", "AMYLASE" in the last 72 hours. Cardiac Enzymes No results for input(s): "CKTOTAL", "CKMB", "CKMBINDEX", "TROPONINI" in the last 72 hours.  BNP: BNP (last 3 results) Recent Labs    03/13/23 0109  BNP 1,662.5*    ProBNP (last 3 results) No results for input(s): "PROBNP" in the last 8760 hours.   D-Dimer No results for input(s): "DDIMER" in the last 72 hours. Hemoglobin A1C No results for input(s): "HGBA1C" in the last 72 hours. Fasting Lipid Panel No results for input(s): "CHOL", "HDL", "LDLCALC", "TRIG", "CHOLHDL", "LDLDIRECT" in the last 72 hours. Thyroid Function Tests No results for input(s): "TSH", "T4TOTAL", "T3FREE", "THYROIDAB" in the last 72 hours.  Invalid input(s): "FREET3"  Other results:   Imaging    EP STUDY  Result Date: 03/20/2023 See surgical note for result.  VAS US RENAL ARTERY DUPLEX  Result Date: 03/19/2023 ABDOMINAL VISCERAL Patient Name:  SADEEN BEHRLE  Date of Exam:   03/19/2023 Medical Rec #: [id number]            Accession #:    [id number] Date  of Birth: Sep 10, 1951            Patient Gender: F Patient Age:   79 years Exam Location:  [facility] Procedure:      VAS US RENAL ARTERY DUPLEX Referring Phys: [id number] LINDSAY NICOLE [facility] -------------------------------------------------------------------------------- Indications: Stenosis, renal I70.1 High Risk Factors: Hypertension, hyperlipidemia. Limitations: Air/bowel gas, obesity, patient discomfort and respiratory disturbance. Comparison Study: No prior studies. Performing Technologist: Chanda Busing RVT  Examination Guidelines: A complete evaluation includes B-mode imaging, spectral Doppler, color Doppler, and power Doppler as needed of all accessible portions of each vessel. Bilateral  testing is considered an integral part of a complete examination. Limited examinations for reoccurring indications may be performed as noted.  Duplex Findings: +--------------------+--------+--------+------+------------------+ Mesenteric          PSV cm/sEDV cm/sPlaque     Comments      +--------------------+--------+--------+------+------------------+ Aorta Mid             348     100                            +--------------------+--------+--------+------+------------------+ Celiac Artery Origin                      Unable to insonate +--------------------+--------+--------+------+------------------+ SMA Origin                                Unable to insonate +--------------------+--------+--------+------+------------------+    +------------------+--------+--------+-------+ Right Renal ArteryPSV cm/sEDV cm/sComment +------------------+--------+--------+-------+ Origin               74      13           +------------------+--------+--------+-------+ Proximal            101      18           +------------------+--------+--------+-------+ Mid                 125      19           +------------------+--------+--------+-------+ Distal              143      29           +------------------+--------+--------+-------+ +-----------------+--------+--------+-------+ Left Renal ArteryPSV cm/sEDV cm/sComment +-----------------+--------+--------+-------+ Origin              45      13           +-----------------+--------+--------+-------+ Proximal            74      20           +-----------------+--------+--------+-------+ Mid                 52      18           +-----------------+--------+--------+-------+ Distal              50      11           +-----------------+--------+--------+-------+ +------------+--------+--------+----+-----------+--------+--------+----+ Right KidneyPSV cm/sEDV cm/sRI  Left KidneyPSV cm/sEDV cm/sRI    +------------+--------+--------+----+-----------+--------+--------+----+ Upper Pole  34      10      0.71Upper Pole 42      8       0.82 +------------+--------+--------+----+-----------+--------+--------+----+ Mid         43  9       0.        51      15      0.71 +------------+--------+--------+----+-----------+--------+--------+----+ Lower Pole  65      21      0.67Lower Pole 50      9       0.82 +------------+--------+--------+----+-----------+--------+--------+----+ Hilar       88      17      0.81Hilar      55      17      0.68 +------------+--------+--------+----+-----------+--------+--------+----+ +------------------+----+------------------+----+ Right Kidney          Left Kidney            +------------------+----+------------------+----+ RAR                   RAR                    +------------------+----+------------------+----+ RAR (manual)      0.41RAR (manual)      0.21 +------------------+----+------------------+----+ Cortex                Cortex                 +------------------+----+------------------+----+ Cortex thickness      Corex thickness        +------------------+----+------------------+----+ Kidney length (cm)8.00Kidney length (cm)8.00 +------------------+----+------------------+----+  Summary: Renal:  Right: Abnormal size for the right kidney. Normal right Resisitive        Index. 1-59% stenosis of the right renal artery. Left:  Abnormal size for the left kidney. Normal left Resistive        Index. 1-59% stenosis of the left renal artery.  *See table(s) above for measurements and observations.  Diagnosing physician: Lemar Livings MD  Electronically signed by Lemar Livings MD on 03/19/2023 at 12:11:25 PM.    Final      Medications:     Scheduled Medications:  amiodarone  200 mg Oral Daily   [MAR Hold] aspirin EC  81 mg Oral Daily   [MAR Hold] atorvastatin  40 mg Oral Daily   [MAR Hold] Chlorhexidine Gluconate  Cloth  6 each Topical Daily   [MAR Hold] dapagliflozin propanediol  10 mg Oral Daily   [MAR Hold] furosemide  80 mg Intravenous BID   metoprolol succinate  25 mg Oral Daily   potassium chloride  40 mEq Oral Once   [MAR Hold] sertraline  100 mg Oral Daily   [MAR Hold] sodium chloride flush  10-40 mL Intracatheter Q12H   [MAR Hold] sodium chloride flush  3 mL Intravenous Q12H   [MAR Hold] sodium chloride flush  3 mL Intravenous Q12H   [MAR Hold] spironolactone  12.5 mg Oral Daily    Infusions:  sodium chloride     [MAR Hold] sodium chloride     heparin 1,250 Units/hr (03/20/23 0744)   [MAR Hold] sodium chloride Stopped (03/13/23 0305)    PRN Medications: [MAR Hold] sodium chloride, [MAR Hold] acetaminophen, [MAR Hold] ALPRAZolam, [MAR Hold] loperamide, [MAR Hold] melatonin, [MAR Hold] ondansetron (ZOFRAN) IV, [MAR Hold] polyethylene glycol, [MAR Hold] prochlorperazine, [MAR Hold] sodium chloride flush, [MAR Hold] sodium chloride flush  Assessment/Plan   1. Acute on chronic systolic CHF:  Echo this admission with EF 35-40%, moderately decreased RV systolic function.  RHC this admission with biventricular elevated filling pressures, low PAPi but preserved CI.  Pulmonary venous hypertension.  Suspect ischemic cardiomyopathy + component of cor pulmonale from COPD.  TEE today with EF 30-35%, moderate RV dysfunction, mod-severe TR, IVC dilated.  She is volume overloaded still but weight is trending down (dilated IVC on TEE today).  Creatinine higher at 1.88.   Co-ox 66%.  - Will keep Lasix at 80 mg IV bid today but will not give metolazone with rise in creatinine.  - Decrease Toprol XL to 25 mg daily with volume overload and bradycardia post-DCCV. - Farxiga 10 daily - spironolactone 12.5 daily - Stop hydralazine/Imdur with lower BP today.  - She has significant bilateral renal artery stenosis by prior CTA but renal artery doppler does not suggest hemodynamic significance.  Could probably use  ARB or Entresto in the future but would like to see stable creatinine.  2. CKD stage 3: Creatinine higher 1.36 => 1.57 => 1.88.  Follow closely with diuresis.   3. Atrial fibrillation: History of PAF.  AF this admission, was in NSR in 5/24.  Probably not a good long-term amiodarone candidate with severe COPD on home oxygen.  Amiodarone gtt begun this admission for rate control with rapid AF, now back in NSR after DCCV.  - As above, would decrease Toprol XL to 25 mg daily.  - Stop IV amiodarone and start amiodarone 200 daily. I will not continue this medication long-term given her lung disease, stop at discharge most likely.  - Continue anticoagulation.  Currently on heparin gtt, transition to Eliquis tomorrow.   - May be a candidate for Tikosyn in future to maintain NSR.  4. CAD s/p CABG: Cath 7/2 with patent grafts.  No ACS. No chest pain.  - Continue statin.  5. COPD: Severe, on home oxygen.  6. Renal artery stenosis: By CTA.  Renal artery dopplers suggested 1-59% bilateral ICA stenosis.   7. Mesenteric artery stenosis: No intestinal angina reported.   [name] 03/20/2023 8:18 AM

## 2023-03-21 LAB — COOXEMETRY PANEL
Carboxyhemoglobin: 2 % — ABNORMAL HIGH (ref 0.5–1.5)
Methemoglobin: <0.7 % (ref 0.0–1.5)
O2 Saturation: 66.9 %
Total hemoglobin: 10.1 g/dL — ABNORMAL LOW (ref 12.0–16.0)

## 2023-03-21 LAB — RENAL FUNCTION PANEL
Albumin: 3.3 g/dL — ABNORMAL LOW (ref 3.5–5.0)
Anion gap: 13 (ref 5–15)
BUN: 36 mg/dL — ABNORMAL HIGH (ref 8–23)
CO2: 30 mmol/L (ref 22–32)
Calcium: 8.7 mg/dL — ABNORMAL LOW (ref 8.9–10.3)
Chloride: 92 mmol/L — ABNORMAL LOW (ref 98–111)
Creatinine, Ser: 1.95 mg/dL — ABNORMAL HIGH (ref 0.44–1.00)
GFR, Estimated: 27 mL/min — ABNORMAL LOW (ref >60)
Glucose, Bld: 107 mg/dL — ABNORMAL HIGH (ref 70–99)
Phosphorus: 5.7 mg/dL — ABNORMAL HIGH (ref 2.5–4.6)
Potassium: 3.7 mmol/L (ref 3.5–5.1)
Sodium: 135 mmol/L (ref 135–145)

## 2023-03-21 LAB — CBC
HCT: 31.4 % — ABNORMAL LOW (ref 36.0–46.0)
Hemoglobin: 9.6 g/dL — ABNORMAL LOW (ref 12.0–15.0)
MCH: 32.9 pg (ref 26.0–34.0)
MCHC: 30.6 g/dL (ref 30.0–36.0)
MCV: 107.5 fL — ABNORMAL HIGH (ref 80.0–100.0)
Platelets: 160 10*3/uL (ref 150–400)
RBC: 2.92 MIL/uL — ABNORMAL LOW (ref 3.87–5.11)
RDW: 15.5 % (ref 11.5–15.5)
WBC: 4.1 10*3/uL (ref 4.0–10.5)
nRBC: 0 % (ref 0.0–0.2)

## 2023-03-21 LAB — LIPOPROTEIN A (LPA): Lipoprotein (a): 221 nmol/L — ABNORMAL HIGH (ref <75.0)

## 2023-03-21 LAB — STOOL CULTURE REFLEX - RSASHR

## 2023-03-21 LAB — HEPARIN LEVEL (UNFRACTIONATED): Heparin Unfractionated: 0.66 IU/mL (ref 0.30–0.70)

## 2023-03-21 MED ORDER — FUROSEMIDE 10 MG/ML IJ SOLN
12.0000 mg/h | INTRAVENOUS | Status: DC
  Administered 2023-03-21: 12 mg/h via INTRAVENOUS
  Filled 2023-03-21 (×2): qty 20

## 2023-03-21 MED ORDER — ACETAZOLAMIDE 250 MG PO TABS: 250.0000 mg | ORAL_TABLET | Freq: Two times a day (BID) | ORAL | Status: DC

## 2023-03-21 MED ORDER — ACETAZOLAMIDE 250 MG PO TABS
250.0000 mg | ORAL_TABLET | Freq: Two times a day (BID) | ORAL | Status: AC
  Administered 2023-03-21 (×2): 250 mg via ORAL
  Filled 2023-03-21 (×2): qty 1

## 2023-03-21 MED ORDER — APIXABAN 5 MG PO TABS
5.0000 mg | ORAL_TABLET | Freq: Two times a day (BID) | ORAL | Status: DC
  Administered 2023-03-21 – 2023-03-25 (×9): 5 mg via ORAL
  Filled 2023-03-21 (×9): qty 1

## 2023-03-21 NOTE — Progress Notes (Signed)
Occupational Therapy Treatment Patient Details Name: Monica Zamora MRN: [id number] DOB: [birth year] Today's Date: 03/21/2023   History of present illness 72 yo female admitted 6/28 with chest pain, bradycardia, hypotension and acute on chronic CHF. PMHx: Afib on Eliquis, HTN, respiratory failure on 2L, CAD s/p CABG, HFrEF   OT comments  Patient on BSC upon entry and supervision to return to EOB. Patient performed tub transfer, simulated to home environment, with min guard assist and verbal cues for safety. Therapist recommended tub rails at home to increase safety. Handout for energy conservation provided and reviewed with patient. Discharge recommendations continue to be appropriate. Acute OT to continue to follow.    Recommendations for follow up therapy are one component of a multi-disciplinary discharge planning process, led by the attending physician.  Recommendations may be updated based on patient status, additional functional criteria and insurance authorization.    Assistance Recommended at Discharge Intermittent Supervision/Assistance  Patient can return home with the following  A little help with walking and/or transfers;A little help with bathing/dressing/bathroom;Assistance with cooking/housework;Direct supervision/assist for medications management;Direct supervision/assist for financial management;Assist for transportation;Help with stairs or ramp for entrance   Equipment Recommendations  Other (comment) (patient has all needed DME)    Recommendations for Other Services      Precautions / Restrictions Precautions Precautions: Fall;Other (comment) Precaution Comments: watch sats Restrictions Weight Bearing Restrictions: No       Mobility Bed Mobility Overal bed mobility: Modified Independent             General bed mobility comments: seated on EOB upon entry and transferred to recliner    Transfers Overall transfer level: Needs assistance Equipment used:  None Transfers: Sit to/from Stand, Bed to chair/wheelchair/BSC Sit to Stand: Min guard, Supervision     Step pivot transfers: Min guard     General transfer comment: performed toilet transfer with supervision and min guard for tub transfer without an assistive device     Balance Overall balance assessment: Needs assistance Sitting-balance support: Bilateral upper extremity supported, Feet supported Sitting balance-Leahy Scale: Good Sitting balance - Comments: sitting EOB   Standing balance support: Single extremity supported, Bilateral upper extremity supported, During functional activity Standing balance-Leahy Scale: Fair Standing balance comment: reliant on external support                           ADL either performed or assessed with clinical judgement   ADL Overall ADL's : Needs assistance/impaired                         Toilet Transfer: Supervision/safety;BSC/3in1   Toileting- Clothing Manipulation and Hygiene: Modified independent;Sitting/lateral lean   Tub/ Shower Transfer: Walk-in shower;Min guard;Cueing for Contractor Details (indicate cue type and reason): simulated        Extremity/Trunk Assessment              Vision       Perception     Praxis      Cognition Arousal/Alertness: Awake/alert Behavior During Therapy: WFL for tasks assessed/performed Overall Cognitive Status: Within Functional Limits for tasks assessed                                          Exercises      Shoulder Instructions       General  Comments reviewed energy conservation strategies    Pertinent Vitals/ Pain       Pain Assessment Pain Assessment: No/denies pain  Home Living                                          Prior Functioning/Environment              Frequency  Min 1X/week        Progress Toward Goals  OT Goals(current goals can now be found in the care plan  section)  Progress towards OT goals: Progressing toward goals  Acute Rehab OT Goals Patient Stated Goal: go home OT Goal Formulation: With patient Time For Goal Achievement: 03/28/23 Potential to Achieve Goals: Good ADL Goals Pt Will Perform Lower Body Dressing: with modified independence;sitting/lateral leans;sit to/from stand Pt Will Perform Tub/Shower Transfer: Shower transfer;shower seat;ambulating;rolling walker Additional ADL Goal #1: pt will verbalize x3 energy conservation strategies in prep for ADLs  Plan Discharge plan remains appropriate;Frequency remains appropriate    Co-evaluation                 AM-PAC OT "6 Clicks" Daily Activity     Outcome Measure   Help from another person eating meals?: A Little Help from another person taking care of personal grooming?: A Little Help from another person toileting, which includes using toliet, bedpan, or urinal?: A Little Help from another person bathing (including washing, rinsing, drying)?: A Lot Help from another person to put on and taking off regular upper body clothing?: A Little Help from another person to put on and taking off regular lower body clothing?: A Little 6 Click Score: 17    End of Session Equipment Utilized During Treatment: Oxygen (2 liters)  OT Visit Diagnosis: Unsteadiness on feet (R26.81);Other abnormalities of gait and mobility (R26.89);Muscle weakness (generalized) (M62.81)   Activity Tolerance Patient tolerated treatment well   Patient Left in chair;with call bell/phone within reach;with family/visitor present   Nurse Communication Mobility status        Time: 6440-3474 OT Time Calculation (min): 27 min  Charges: OT General Charges $OT Visit: 1 Visit OT Treatments $Self Care/Home Management : 8-22 mins $Therapeutic Activity: 8-22 mins  Alfonse Flavors, OTA Acute Rehabilitation Services  Office [phone]   Dewain Penning 03/21/2023, 1:30 PM

## 2023-03-21 NOTE — Progress Notes (Signed)
Patient ID: Monica Zamora, female   DOB: [birth year], 72 y.o.   MRN: [id number]     Advanced Heart Failure Rounding Note  PCP-Cardiologist: Olga Millers, MD   Subjective:    TEE/DCCV 7/5 to NSR.  TEE showed EF 30-35%, moderate RV dysfunction, mod-severe TR, IVC dilated.  Creatinine 1.36 => 1.57 => 1.88 => 1.95.  I/Os even yesterday, CVP still 18-19 today. She remains in NSR in 61s.   Objective:   Weight Range: 76 kg Body mass index is 27.04 kg/m.   Vital Signs:   Temp:  [97.6 F (36.4 C)-98.2 F (36.8 C)] 97.6 F (36.4 C) (07/06 0851) Pulse Rate:  [60-66] 60 (07/06 0851) Resp:  [16-20] 16 (07/06 0851) BP: (98-114)/(49-61) 105/54 (07/06 0851) SpO2:  [96 %-99 %] 98 % (07/06 0851) Weight:  [76 kg] 76 kg (07/06 0500) Last BM Date : 03/19/23  Weight change: Filed Weights   03/19/23 0410 03/20/23 0629 03/21/23 0500  Weight: 77.2 kg 75 kg 76 kg    Intake/Output:   Intake/Output Summary (Last 24 hours) at 03/21/2023 1113 Last data filed at 03/21/2023 0600 Gross per 24 hour  Intake 857.5 ml  Output 825 ml  Net 32.5 ml      Physical Exam    General: NAD Neck: JVP 16, no thyromegaly or thyroid nodule.  Lungs: Clear to auscultation bilaterally with normal respiratory effort. CV: Nondisplaced PMI.  Heart regular S1/S2, no S3/S4, no murmur.  No peripheral edema.   Abdomen: Soft, nontender, no hepatosplenomegaly, no distention.  Skin: Intact without lesions or rashes.  Neurologic: Alert and oriented x 3.  Psych: Normal affect. Extremities: No clubbing or cyanosis.  HEENT: Normal.   Telemetry   NSR 50s (personally reviewed)  Labs    CBC Recent Labs    03/20/23 0519 03/21/23 0524  WBC 4.2 4.1  HGB 9.9* 9.6*  HCT 31.9* 31.4*  MCV 106.7* 107.5*  PLT 169 160   Basic Metabolic Panel Recent Labs    78/29/56 0432 03/20/23 0519 03/21/23 0524  NA 140 136 135  K 4.6 3.4* 3.7  CL 98 90* 92*  CO2 30 32 30  GLUCOSE 108* 103* 107*  BUN 21 25* 36*  CREATININE  1.57* 1.88* 1.95*  CALCIUM 9.4 8.7* 8.7*  MG 1.8  --   --   PHOS  --   --  5.7*   Liver Function Tests Recent Labs    03/21/23 0524  ALBUMIN 3.3*    No results for input(s): "LIPASE", "AMYLASE" in the last 72 hours. Cardiac Enzymes No results for input(s): "CKTOTAL", "CKMB", "CKMBINDEX", "TROPONINI" in the last 72 hours.  BNP: BNP (last 3 results) Recent Labs    03/13/23 0109  BNP 1,662.5*    ProBNP (last 3 results) No results for input(s): "PROBNP" in the last 8760 hours.   D-Dimer No results for input(s): "DDIMER" in the last 72 hours. Hemoglobin A1C No results for input(s): "HGBA1C" in the last 72 hours. Fasting Lipid Panel No results for input(s): "CHOL", "HDL", "LDLCALC", "TRIG", "CHOLHDL", "LDLDIRECT" in the last 72 hours. Thyroid Function Tests No results for input(s): "TSH", "T4TOTAL", "T3FREE", "THYROIDAB" in the last 72 hours.  Invalid input(s): "FREET3"  Other results:   Imaging    No results found.   Medications:     Scheduled Medications:  amiodarone  200 mg Oral Daily   apixaban  5 mg Oral BID   aspirin EC  81 mg Oral Daily   atorvastatin  40 mg Oral Daily  Chlorhexidine Gluconate Cloth  6 each Topical Daily   dapagliflozin propanediol  10 mg Oral Daily   metoprolol succinate  25 mg Oral Daily   sertraline  100 mg Oral Daily   sodium chloride flush  10-40 mL Intracatheter Q12H   sodium chloride flush  3 mL Intravenous Q12H   sodium chloride flush  3 mL Intravenous Q12H    Infusions:  sodium chloride     sodium chloride Stopped (03/13/23 0305)    PRN Medications: sodium chloride, acetaminophen, ALPRAZolam, loperamide, melatonin, ondansetron (ZOFRAN) IV, polyethylene glycol, prochlorperazine, sodium chloride flush, sodium chloride flush  Assessment/Plan   1. Acute on chronic systolic CHF:  Echo this admission with EF 35-40%, moderately decreased RV systolic function.  RHC this admission with biventricular elevated filling  pressures, low PAPi but preserved CI.  Pulmonary venous hypertension.  Suspect ischemic cardiomyopathy + component of cor pulmonale from COPD.  TEE 7/5 with EF 30-35%, moderate RV dysfunction, mod-severe TR, IVC dilated.  She is still volume overloaded unfortunately with CVP 18-19 this morning.  Creatinine higher at 1.88 => 1.95.   Co-ox 67%.  - Will try Lasix gtt today (had 80 mg IV bolus) with 2 dose of acetazolamide 250 mg bid to see if we can generate some diuresis.  I worry rise in creatinine.  - Stop Toprol XL with volume overload and bradycardia post-DCCV. - Farxiga 10 daily - Hold spironolactone with rising creatinine.  - She has significant bilateral renal artery stenosis by prior CTA but renal artery doppler does not suggest hemodynamic significance.  Could probably use ARB or Entresto in the future but would like to see stable creatinine.  2. CKD stage 3: Creatinine higher 1.36 => 1.57 => 1.88 => 1.95.  Follow closely with diuresis.  I was hoping renal function would improve with diuresis and drop in renal venous pressure but mildly higher again today.  3. Atrial fibrillation: History of PAF.  AF this admission, was in NSR in 5/24.  Probably not a good long-term amiodarone candidate with severe COPD on home oxygen.  Amiodarone gtt begun this admission for rate control with rapid AF, now back in NSR after DCCV.  - As above, would stop Toprol XL with bradycardia and volume overload.   - Continue amiodarone 200 daily to keep her in NSR for now. I will not continue this medication long-term given her lung disease, stop at discharge most likely.  - Continue Eliquis.   - May be a candidate for Tikosyn in future to maintain NSR.  4. CAD s/p CABG: Cath 7/2 with patent grafts.  No ACS. No chest pain.  - Continue statin.  5. COPD: Severe, on home oxygen.  6. Renal artery stenosis: By CTA.  Renal artery dopplers suggested 1-59% bilateral ICA stenosis.   7. Mesenteric artery stenosis: No intestinal  angina reported.   [name] 03/21/2023 11:13 AM

## 2023-03-21 NOTE — Progress Notes (Signed)
PROGRESS NOTE  Monica Zamora UJW:[id number] DOB: 1951-05-24 DOA: 03/13/2023 PCP: Rebecka Apley, NP  Brief History   Patient is a 72 year old female, extensive vasculopath without noted significant workup, with history of coronary artery disease status post CABG, hypertension and atrial fibrillation. Patient was admitted with lower sternal/epigastric pain.  Chest pain was said to be radiating to the neck. Associated nausea and vomiting.  Non rising troponin.  EKG revealed A-fib with nonspecific T wave changes.  The patient underwent left and right heart catheterizations on 03/17/2023.  It demonstrated:   Severe combined CHF with severely elevated right-sided pressures: RAP mean 24 mmHg with large mild wave.; RV P-EDP 57/8-17 mmHg. PAP-mean 6/37-42 mmHg; PCWP 35 mmHg with V wave up to 43. Significant respiratory variation. LV P-EDP 118/16-22 mmHg, AO P-MAP 106/73-79 mmHg Ao sat 96, PA sat 54%. CO-CI (Fick) 4.51-2.35; PAPI 0.86 Significant native vessel CAD with 3 of 3 patent grafts: 100% proximal RCA CTO; 100% mLCx; Prox LAD 60% & mLAD ~70% (competetive flow from LIMA-LAD) Patent LIMA-LAD, SVG-PDA, SVG-OM 3 Cardiology's recommendations are for continued diuresis with IV lasix and heart failure service consult due to severe RV failure. She will be continued on IV heparin for now, but will be converted back to DOAC once invasive procedures are completed.   CTA chest/abdomen/pelvis revealed: 1. No evidence of thoracoabdominal aortic aneurysm or dissection. 2. No pulmonary embolism identified. 3. Extensive multi-vessel coronary artery calcification, status post coronary artery bypass grafting. 4. Morphologic changes in keeping with pulmonary arterial hypertension and right heart failure with retrograde opacification of the hepatic venous system. 5. Mosaic attenuation within the pulmonary parenchyma and bronchial wall thickening in keeping with airway inflammation and multifocal air  trapping. 6. 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 Clinical correlation for signs and symptoms of chronic mesenteric ischemia may be helpful. 7. Diffusely diminutive renal arteries bilaterally. Superimposed focal hemodynamically significant stenosis of the right renal artery origin. 8. Peripheral vascular disease with hemodynamically significant stenoses of the common iliac artery origins bilaterally and diffusely diseased lower extremity arterial inflow vasculature. 9. Morphologic changes suggesting cirrhosis. This could be confirmed with hepatic elastography or tissue sampling. Mild ascites.   Aortic Atherosclerosis     03/18/2023: Creatinine has modestly increased today to 1.36. Will continue to monitor renal status. Cardiology has requested a triple lumen catheter today to follow CVP. Chest pain is felt to be due to decompensated heart failure. Her lasix has been increased to 80 mg IV bid. Recommendations include farxiga 10 mg daily, spironolactone 12.5 daily, Imdur 30 mg daily, and hydralazine 25 mg tid. Consider entresto after Doppler of renal artery is concluded. Intervention may be considered if stenosis is severe or blood pressures are effected. Metoprolol has been changed from tartrate to 50 mg succinate . Heparin will be switched to eliquis once the procedures are complete.   03/19/2023: No new complaints.  Was normal serum creatinine.  Repeat serum creatinine in the morning.  Patient is at risk of contrast associated nephropathy.  03/20/2023: Patient underwent TEE cardioversion today.  Worsening renal function is noted, suspect contrast associated nephropathy.  Check urinalysis and urine sodium.  Serum creatinine has gone from 1.57-1.88.  Monitor renal function daily.  Cautious use of diuretics.  03/21/2023: Patient seen.  Patient remained stable.  Mild worsening of renal function is noted.  Aggressive diuresis by the cardiology  team.  CVP is noted to be 19.  Objective   Vitals:  Vitals:  03/21/23 0851 03/21/23 1121  BP: (!) 105/54 103/84  Pulse: 60 (!) 133  Resp: 16 (!) 23  Temp: 97.6 F (36.4 C)   SpO2: 98% 100%    Exam:  Constitutional:  The patient is awake, alert, and oriented x 3. No acute distress. Respiratory:  Decreased air entry globally. Cardiovascular:  S1-S2.Marland Kitchen Abdomen:  Abdomen is obese, soft, non-tender. Musculoskeletal:  Leg edema has improved significantly. Neurologic: Awake and alert.  Patient was all extremities.  S   I have personally reviewed the following:    Scheduled Meds:  acetaZOLAMIDE  250 mg Oral BID   amiodarone  200 mg Oral Daily   apixaban  5 mg Oral BID   atorvastatin  40 mg Oral Daily   Chlorhexidine Gluconate Cloth  6 each Topical Daily   dapagliflozin propanediol  10 mg Oral Daily   sertraline  100 mg Oral Daily   sodium chloride flush  10-40 mL Intracatheter Q12H   sodium chloride flush  3 mL Intravenous Q12H   sodium chloride flush  3 mL Intravenous Q12H   Continuous Infusions:  sodium chloride     furosemide (LASIX) 200 mg in dextrose 5 % 100 mL (2 mg/mL) infusion 12 mg/hr (03/21/23 1215)   sodium chloride Stopped (03/13/23 0305)    Principal Problem:   Acute on chronic combined systolic and diastolic CHF (congestive heart failure) (HCC) Active Problems:   AF (paroxysmal atrial fibrillation) (HCC)   CAD (coronary artery disease)  A & P  Principal Problem:   Acute on chronic systolic (congestive) heart failure (HCC)   Acute on chronic HFrEF 40-45% -Last 2D echo done on 03/04/2023 revealed LVEF 40-45%, left ventricle global hypokinesis -Patient presented with volume overload on exam, right JVD, bilateral lower extremity pitting edema, BNP greater than 1600 -IV Lasix.   -Start strict I's and O's and daily weight -LHC completed 7/2. Results above.   Chest pain:  Elevated troponin: Troponin flat, 39, repeat 40 For possible left and right  heart catheterization tomorrow. Continue to monitor renal function.   -LHC completed 7/2. Results above.   AKI on CKD 3A: -Baseline creatinine appears to be 1.1 with GFR 51 -Presented with creatinine of 1.64 with GFR of 33 -AKI is likely multifactorial, including, but not limited to ATN/cardiorenal syndrome/exposure to contrast. -Aggressive diuresis. -Avoid hypotension. -Avoid nephrotoxic medications. -Keep MAP greater than 65 mmHg. -Strict I's and O's. -Dose all medications assuming GFR of less than 50 mL/min. -Serum creatinine improved from 1.88 to 1.36 today. 03/19/2023: Cautious use of diuretics.  Serum creatinine of 1.57 noted today. 03/20/2023: Serum creatinine is 1.88 today.  Suspect contrast associated nephropathy.  Check urinalysis and urine sodium.   Elevated liver chemistries, unclear etiology, suspect related to alcohol use -Initially elevated AST, but back to normal. -Continue to monitor for now -Avoid hepatotoxic agents -Patient has a history of heavy alcohol use. She states that she has not had a drink for 2 months.   Permanent atrial fibrillation, currently rate controlled, on Eliquis Initially presented hypotensive and bradycardic, home beta-blocker was reversed with glucagon Resume home Eliquis Closely monitor on telemetry Rate control with metoprolol. Anticipate transition to toprol for discharge.  Renal artery stenosis: On CTa nd ultrasound. Doppler of renal arteries pending. Consider entresto if appropriate.   Chronic anxiety/depression Resume home Zoloft   Chronic hypoxic respiratory failure on 2 L nasal cannula continuously Appears to be at her baseline oxygen requirement Maintain O2 saturation above 90%   Extensive vascular disease: -Vascular surgery  team has been consulted. -Abdominal pain on presentation, but with normal lactic acid.   DVT prophylaxis: Heparin drip. Code Status: Full code. Family Communication:  Disposition Plan: This will depend on  hospital course.     Consultants:  Cardiology.   Procedures:  Cardiac catheterization    Antimicrobials:  None.  Berton Mount, MD [facility] Direct contact: see [email or web address]  7PM-7AM contact night coverage as above 03/18/2023, 7:24 PM  LOS: 4 days     LOS: 8 days

## 2023-03-22 DIAGNOSIS — I5043 Acute on chronic combined systolic (congestive) and diastolic (congestive) heart failure: Secondary | ICD-10-CM | POA: Diagnosis not present

## 2023-03-22 LAB — RENAL FUNCTION PANEL
Albumin: 3.4 g/dL — ABNORMAL LOW (ref 3.5–5.0)
Anion gap: 12 (ref 5–15)
BUN: 47 mg/dL — ABNORMAL HIGH (ref 8–23)
CO2: 33 mmol/L — ABNORMAL HIGH (ref 22–32)
Calcium: 9.1 mg/dL (ref 8.9–10.3)
Chloride: 90 mmol/L — ABNORMAL LOW (ref 98–111)
Creatinine, Ser: 2.36 mg/dL — ABNORMAL HIGH (ref 0.44–1.00)
GFR, Estimated: 21 mL/min — ABNORMAL LOW (ref >60)
Glucose, Bld: 97 mg/dL (ref 70–99)
Phosphorus: 6.2 mg/dL — ABNORMAL HIGH (ref 2.5–4.6)
Potassium: 3.2 mmol/L — ABNORMAL LOW (ref 3.5–5.1)
Sodium: 135 mmol/L (ref 135–145)

## 2023-03-22 LAB — CBC
HCT: 32.2 % — ABNORMAL LOW (ref 36.0–46.0)
Hemoglobin: 10.1 g/dL — ABNORMAL LOW (ref 12.0–15.0)
MCH: 33.7 pg (ref 26.0–34.0)
MCHC: 31.4 g/dL (ref 30.0–36.0)
MCV: 107.3 fL — ABNORMAL HIGH (ref 80.0–100.0)
Platelets: 175 10*3/uL (ref 150–400)
RBC: 3 MIL/uL — ABNORMAL LOW (ref 3.87–5.11)
RDW: 15.2 % (ref 11.5–15.5)
WBC: 4.4 10*3/uL (ref 4.0–10.5)
nRBC: 0 % (ref 0.0–0.2)

## 2023-03-22 LAB — COOXEMETRY PANEL
Carboxyhemoglobin: 2.3 % — ABNORMAL HIGH (ref 0.5–1.5)
Methemoglobin: <0.7 % (ref 0.0–1.5)
O2 Saturation: 73.1 %
Total hemoglobin: 10.4 g/dL — ABNORMAL LOW (ref 12.0–16.0)

## 2023-03-22 LAB — HEPARIN LEVEL (UNFRACTIONATED): Heparin Unfractionated: >1.1 IU/mL — ABNORMAL HIGH (ref 0.30–0.70)

## 2023-03-22 LAB — STOOL CULTURE

## 2023-03-22 MED ORDER — POTASSIUM CHLORIDE 20 MEQ PO PACK
20.0000 meq | PACK | Freq: Once | ORAL | Status: AC
  Administered 2023-03-22: 20 meq via ORAL
  Filled 2023-03-22: qty 1

## 2023-03-22 MED ORDER — POTASSIUM CHLORIDE CRYS ER 20 MEQ PO TBCR: 40.0000 meq | EXTENDED_RELEASE_TABLET | Freq: Once | ORAL | Status: DC

## 2023-03-22 NOTE — Progress Notes (Signed)
PROGRESS NOTE  Monica Zamora XBJ:[id number] DOB: September 19, 1950 DOA: 03/13/2023 PCP: Rebecka Apley, NP  Brief History   Patient is a 72 year old female, extensive vasculopath without noted significant workup, with history of coronary artery disease status post CABG, hypertension and atrial fibrillation. Patient was admitted with lower sternal/epigastric pain.  Chest pain was said to be radiating to the neck. Associated nausea and vomiting.  Non rising troponin.  EKG revealed A-fib with nonspecific T wave changes.  The patient underwent left and right heart catheterizations on 03/17/2023.  It demonstrated:   Severe combined CHF with severely elevated right-sided pressures: RAP mean 24 mmHg with large mild wave.; RV P-EDP 57/8-17 mmHg. PAP-mean 6/37-42 mmHg; PCWP 35 mmHg with V wave up to 43. Significant respiratory variation. LV P-EDP 118/16-22 mmHg, AO P-MAP 106/73-79 mmHg Ao sat 96, PA sat 54%. CO-CI (Fick) 4.51-2.35; PAPI 0.86 Significant native vessel CAD with 3 of 3 patent grafts: 100% proximal RCA CTO; 100% mLCx; Prox LAD 60% & mLAD ~70% (competetive flow from LIMA-LAD) Patent LIMA-LAD, SVG-PDA, SVG-OM 3 Cardiology's recommendations are for continued diuresis with IV lasix and heart failure service consult due to severe RV failure. She will be continued on IV heparin for now, but will be converted back to DOAC once invasive procedures are completed.   CTA chest/abdomen/pelvis revealed: 1. No evidence of thoracoabdominal aortic aneurysm or dissection. 2. No pulmonary embolism identified. 3. Extensive multi-vessel coronary artery calcification, status post coronary artery bypass grafting. 4. Morphologic changes in keeping with pulmonary arterial hypertension and right heart failure with retrograde opacification of the hepatic venous system. 5. Mosaic attenuation within the pulmonary parenchyma and bronchial wall thickening in keeping with airway inflammation and multifocal air  trapping. 6. 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 Clinical correlation for signs and symptoms of chronic mesenteric ischemia may be helpful. 7. Diffusely diminutive renal arteries bilaterally. Superimposed focal hemodynamically significant stenosis of the right renal artery origin. 8. Peripheral vascular disease with hemodynamically significant stenoses of the common iliac artery origins bilaterally and diffusely diseased lower extremity arterial inflow vasculature. 9. Morphologic changes suggesting cirrhosis. This could be confirmed with hepatic elastography or tissue sampling. Mild ascites.   Aortic Atherosclerosis     03/18/2023: Creatinine has modestly increased today to 1.36. Will continue to monitor renal status. Cardiology has requested a triple lumen catheter today to follow CVP. Chest pain is felt to be due to decompensated heart failure. Her lasix has been increased to 80 mg IV bid. Recommendations include farxiga 10 mg daily, spironolactone 12.5 daily, Imdur 30 mg daily, and hydralazine 25 mg tid. Consider entresto after Doppler of renal artery is concluded. Intervention may be considered if stenosis is severe or blood pressures are effected. Metoprolol has been changed from tartrate to 50 mg succinate . Heparin will be switched to eliquis once the procedures are complete.   03/22/2023: Patient seen.  Diuretics have been held due to worsening renal function.  Serum creatinine is 2.36 today.  Continue to monitor renal function.  Objective   Vitals:  Vitals:   03/22/23 0612 03/22/23 1014  BP: (!) 109/50 (!) 93/48  Pulse: 75 (!) 52  Resp: 16 16  Temp: 97.8 F (36.6 C) 97.6 F (36.4 C)  SpO2: 95% 93%    Exam:  Constitutional:  The patient is awake, alert, and oriented x 3. No acute distress. Respiratory:  Decreased air entry globally. Cardiovascular:  S1-S2.Marland Kitchen Abdomen:  Abdomen is obese, soft,  non-tender.  Musculoskeletal:  Leg edema has improved significantly. Neurologic: Awake and alert.  Patient was all extremities.  S   I have personally reviewed the following:    Scheduled Meds:  amiodarone  200 mg Oral Daily   apixaban  5 mg Oral BID   atorvastatin  40 mg Oral Daily   Chlorhexidine Gluconate Cloth  6 each Topical Daily   sertraline  100 mg Oral Daily   sodium chloride flush  10-40 mL Intracatheter Q12H   sodium chloride flush  3 mL Intravenous Q12H   sodium chloride flush  3 mL Intravenous Q12H   Continuous Infusions:  sodium chloride     sodium chloride Stopped (03/13/23 0305)    Principal Problem:   Acute on chronic combined systolic and diastolic CHF (congestive heart failure) (HCC) Active Problems:   AF (paroxysmal atrial fibrillation) (HCC)   CAD (coronary artery disease)  A & P  Principal Problem:   Acute on chronic systolic (congestive) heart failure (HCC)   Acute on chronic HFrEF 40-45% -Last 2D echo done on 03/04/2023 revealed LVEF 40-45%, left ventricle global hypokinesis -Patient presented with volume overload on exam, right JVD, bilateral lower extremity pitting edema, BNP greater than 1600 -IV Lasix.   -Start strict I's and O's and daily weight -LHC completed 7/2. Results above.   Chest pain:  Elevated troponin: Troponin flat, 39, repeat 40 For possible left and right heart catheterization tomorrow. Continue to monitor renal function.   -LHC completed 7/2. Results above.   AKI on CKD 3A: -Baseline creatinine appears to be 1.1 with GFR 51 -Presented with creatinine of 1.64 with GFR of 33 -AKI is likely multifactorial, including, but not limited to ATN/cardiorenal syndrome/exposure to contrast. -Aggressive diuresis. -Avoid hypotension. -Avoid nephrotoxic medications. -Keep MAP greater than 65 mmHg. -Strict I's and O's. -Dose all medications assuming GFR of less than 50 mL/min. -Serum creatinine improved from 1.88 to 1.36  today. 03/19/2023: Cautious use of diuretics.  Serum creatinine of 1.57 noted today. 03/20/2023: Serum creatinine is 1.88 today.  Suspect contrast associated nephropathy.  Check urinalysis and urine sodium.   Elevated liver chemistries, unclear etiology, suspect related to alcohol use -Initially elevated AST, but back to normal. -Continue to monitor for now -Avoid hepatotoxic agents -Patient has a history of heavy alcohol use. She states that she has not had a drink for 2 months.   Permanent atrial fibrillation, currently rate controlled, on Eliquis Initially presented hypotensive and bradycardic, home beta-blocker was reversed with glucagon Resume home Eliquis Closely monitor on telemetry Rate control with metoprolol. Anticipate transition to toprol for discharge.  Renal artery stenosis: On CTa nd ultrasound. Doppler of renal arteries pending. Consider entresto if appropriate.   Chronic anxiety/depression Resume home Zoloft   Chronic hypoxic respiratory failure on 2 L nasal cannula continuously Appears to be at her baseline oxygen requirement Maintain O2 saturation above 90%   Extensive vascular disease: -Vascular surgery team has been consulted. -Abdominal pain on presentation, but with normal lactic acid.   DVT prophylaxis: Heparin drip. Code Status: Full code. Family Communication:  Disposition Plan: This will depend on hospital course.     Consultants:  Cardiology.   Procedures:  Cardiac catheterization    Antimicrobials:  None.  Berton Mount, MD [facility] Direct contact: see [email or web address]  7PM-7AM contact night coverage as above 03/18/2023, 7:24 PM  LOS: 4 days     LOS: 9 days

## 2023-03-22 NOTE — Progress Notes (Signed)
Patient ID: Monica Zamora, female   DOB: 1951-09-06, 72 y.o.   MRN: [id number]     Advanced Heart Failure Rounding Note  PCP-Cardiologist: Olga Millers, MD   Subjective:    TEE/DCCV 7/5 to NSR, remains in NSR today.  TEE showed EF 30-35%, moderate RV dysfunction, mod-severe TR, IVC dilated.  Creatinine 1.36 => 1.57 => 1.88 => 1.95 => 2.36.  She is currently on Lasix gtt 12 mg/hr with acetazolamide yesterday and weight down 3 more lbs.  CVP 8 on my read this morning.    Objective:   Weight Range: 74.8 kg Body mass index is 26.62 kg/m.   Vital Signs:   Temp:  [97.6 F (36.4 C)-97.8 F (36.6 C)] 97.6 F (36.4 C) (07/07 1014) Pulse Rate:  [52-133] 52 (07/07 1014) Resp:  [16-23] 16 (07/07 1014) BP: (93-114)/(48-84) 93/48 (07/07 1014) SpO2:  [93 %-100 %] 93 % (07/07 1014) Weight:  [74.8 kg] 74.8 kg (07/07 0541) Last BM Date : 03/19/23  Weight change: Filed Weights   03/20/23 0629 03/21/23 0500 03/22/23 0541  Weight: 75 kg 76 kg 74.8 kg    Intake/Output:   Intake/Output Summary (Last 24 hours) at 03/22/2023 1033 Last data filed at 03/22/2023 1024 Gross per 24 hour  Intake 287.48 ml  Output 2900 ml  Net -2612.52 ml      Physical Exam    General: NAD Neck: JVP 8-9 cm, no thyromegaly or thyroid nodule.  Lungs: Clear to auscultation bilaterally with normal respiratory effort. CV: Nondisplaced PMI.  Heart regular S1/S2, no S3/S4, no murmur.  No peripheral edema.   Abdomen: Soft, nontender, no hepatosplenomegaly, no distention.  Skin: Intact without lesions or rashes.  Neurologic: Alert and oriented x 3.  Psych: Normal affect. Extremities: No clubbing or cyanosis.  HEENT: Normal.   Telemetry   NSR 50s (personally reviewed)  Labs    CBC Recent Labs    03/21/23 0524 03/22/23 0526  WBC 4.1 4.4  HGB 9.6* 10.1*  HCT 31.4* 32.2*  MCV 107.5* 107.3*  PLT 160 175   Basic Metabolic Panel Recent Labs    78/29/56 0524 03/22/23 0526  NA 135 135  K 3.7 3.2*   CL 92* 90*  CO2 30 33*  GLUCOSE 107* 97  BUN 36* 47*  CREATININE 1.95* 2.36*  CALCIUM 8.7* 9.1  PHOS 5.7* 6.2*   Liver Function Tests Recent Labs    03/21/23 0524 03/22/23 0526  ALBUMIN 3.3* 3.4*    No results for input(s): "LIPASE", "AMYLASE" in the last 72 hours. Cardiac Enzymes No results for input(s): "CKTOTAL", "CKMB", "CKMBINDEX", "TROPONINI" in the last 72 hours.  BNP: BNP (last 3 results) Recent Labs    03/13/23 0109  BNP 1,662.5*    ProBNP (last 3 results) No results for input(s): "PROBNP" in the last 8760 hours.   D-Dimer No results for input(s): "DDIMER" in the last 72 hours. Hemoglobin A1C No results for input(s): "HGBA1C" in the last 72 hours. Fasting Lipid Panel No results for input(s): "CHOL", "HDL", "LDLCALC", "TRIG", "CHOLHDL", "LDLDIRECT" in the last 72 hours. Thyroid Function Tests No results for input(s): "TSH", "T4TOTAL", "T3FREE", "THYROIDAB" in the last 72 hours.  Invalid input(s): "FREET3"  Other results:   Imaging    No results found.   Medications:     Scheduled Medications:  amiodarone  200 mg Oral Daily   apixaban  5 mg Oral BID   atorvastatin  40 mg Oral Daily   Chlorhexidine Gluconate Cloth  6 each Topical Daily  potassium chloride  20 mEq Oral Once   sertraline  100 mg Oral Daily   sodium chloride flush  10-40 mL Intracatheter Q12H   sodium chloride flush  3 mL Intravenous Q12H   sodium chloride flush  3 mL Intravenous Q12H    Infusions:  sodium chloride     sodium chloride Stopped (03/13/23 0305)    PRN Medications: sodium chloride, acetaminophen, ALPRAZolam, loperamide, melatonin, ondansetron (ZOFRAN) IV, polyethylene glycol, prochlorperazine, sodium chloride flush, sodium chloride flush  Assessment/Plan   1. Acute on chronic systolic CHF:  Echo this admission with EF 35-40%, moderately decreased RV systolic function.  RHC this admission with biventricular elevated filling pressures, low PAPi but  preserved CI.  Pulmonary venous hypertension.  Suspect ischemic cardiomyopathy + component of cor pulmonale from COPD.  TEE 7/5 with EF 30-35%, moderate RV dysfunction, mod-severe TR, IVC dilated.  She has diuresed on Lasix gtt + acetazolamide with CVP down to 8 today.  However, creatinine higher at 1.88 => 1.95 => 2.36.   Co-ox 77%.  - Stop Lasix today with CVP 8 and rising creatinine.  - Now off Toprol XL with volume overload and bradycardia post-DCCV. - [employer] with rising creatinine.  - Hold spironolactone with rising creatinine.  - She has significant bilateral renal artery stenosis by prior CTA but renal artery doppler does not suggest hemodynamic significance.  Could probably use ARB or Entresto in the future but would like to see stable creatinine.  2. CKD stage 3: Creatinine higher 1.36 => 1.57 => 1.88 => 1.95 => 2.36.  I was hoping renal function would improve with diuresis and drop in renal venous pressure but higher again today.  - With CVP 8, will stop diuretic.  3. Atrial fibrillation: History of PAF.  AF this admission, was in NSR in 5/24.  Probably not a good long-term amiodarone candidate with severe COPD on home oxygen.  Amiodarone gtt begun this admission for rate control with rapid AF, now back in NSR after DCCV.  - Off Toprol XL with bradycardia and volume overload.   - Continue amiodarone 200 daily to keep her in NSR for now. I will not continue this medication long-term given her lung disease, stop at discharge most likely.  - Continue Eliquis.   - May be a candidate for Tikosyn in future to maintain NSR.  4. CAD s/p CABG: Cath 7/2 with patent grafts.  No ACS. No chest pain.  - Continue statin.  5. COPD: Severe, on home oxygen.  6. Renal artery stenosis: By CTA.  Renal artery dopplers suggested 1-59% bilateral ICA stenosis.   7. Mesenteric artery stenosis: No intestinal angina reported.   Mobilize.   [name] 03/22/2023 10:33 AM

## 2023-03-22 NOTE — Plan of Care (Signed)

## 2023-03-22 NOTE — Progress Notes (Signed)
Mobility Specialist Progress Note   03/22/23 1640  Mobility  Activity Ambulated with assistance in hallway  Level of Assistance Minimal assist, patient does 75% or more  Assistive Device Front wheel walker  Distance Ambulated (ft) 120 ft  Activity Response Tolerated well  Mobility Referral Yes  $Mobility charge 1 Mobility  Mobility Specialist Start Time (ACUTE ONLY) 1600  Mobility Specialist Stop Time (ACUTE ONLY) 1640  Mobility Specialist Time Calculation (min) (ACUTE ONLY) 40 min   Pre Mobility: 47 HR, 125/55 BP, 97% SpO2 on 2LO2 Post Mobility: 59 HR, 111/52 BP, 99% SpO2 on 2LO2  Received in bed having no complaints and agreeable. Able to get EOB w/ minA and trunk support. Pt presenting w/ decreased activity tolerance this session and keeping ambulation short in hallway d/t LE fatigue. Returned back to room w/o fault and placed in bed w/ call bell in reach and needs met.    Frederico Hamman Mobility Specialist Please contact via SecureChat or  Rehab office at [phone]

## 2023-03-23 ENCOUNTER — Encounter (HOSPITAL_COMMUNITY): Payer: Self-pay | Admitting: Cardiology

## 2023-03-23 DIAGNOSIS — I5043 Acute on chronic combined systolic (congestive) and diastolic (congestive) heart failure: Secondary | ICD-10-CM | POA: Diagnosis not present

## 2023-03-23 LAB — STOOL CULTURE: E coli, Shiga toxin Assay: NEGATIVE

## 2023-03-23 LAB — COOXEMETRY PANEL
Carboxyhemoglobin: 1.9 % — ABNORMAL HIGH (ref 0.5–1.5)
Methemoglobin: <0.7 % (ref 0.0–1.5)
O2 Saturation: 71.5 %
Total hemoglobin: 10.8 g/dL — ABNORMAL LOW (ref 12.0–16.0)

## 2023-03-23 LAB — RENAL FUNCTION PANEL
Albumin: 3.3 g/dL — ABNORMAL LOW (ref 3.5–5.0)
Anion gap: 12 (ref 5–15)
BUN: 46 mg/dL — ABNORMAL HIGH (ref 8–23)
CO2: 33 mmol/L — ABNORMAL HIGH (ref 22–32)
Calcium: 8.5 mg/dL — ABNORMAL LOW (ref 8.9–10.3)
Chloride: 90 mmol/L — ABNORMAL LOW (ref 98–111)
Creatinine, Ser: 2.01 mg/dL — ABNORMAL HIGH (ref 0.44–1.00)
GFR, Estimated: 26 mL/min — ABNORMAL LOW (ref >60)
Glucose, Bld: 103 mg/dL — ABNORMAL HIGH (ref 70–99)
Phosphorus: 4.9 mg/dL — ABNORMAL HIGH (ref 2.5–4.6)
Potassium: 3 mmol/L — ABNORMAL LOW (ref 3.5–5.1)
Sodium: 135 mmol/L (ref 135–145)

## 2023-03-23 LAB — STOOL CULTURE REFLEX - CMPCXR

## 2023-03-23 MED ORDER — POTASSIUM CHLORIDE 20 MEQ PO PACK
40.0000 meq | PACK | Freq: Once | ORAL | Status: AC
  Administered 2023-03-23: 40 meq via ORAL
  Filled 2023-03-23: qty 2

## 2023-03-23 NOTE — Consult Note (Signed)
   [facility] CM Inpatient Consult   03/23/2023  Monica Zamora 07-09-1951 [id number]  [facility] [[facility]]  Accountable Care Organization [ACO] Patient: [employer]   Primary Care Provider:  Rebecka Apley, NP with [facility]   Patient screened for hospitalization with noted high risk score for unplanned readmission risk 10 day length of stay and  to assess for potential [facility]  [[facility]] Care Management service needs for post hospital transition for care coordination.   Not eligible due to PCP is not a [facility] provider for [facility] Coordination  Review of patient's medical record for past medical history and membership affiliate roster reveals this patient is in a [employer] [Special Needs Program] member and will be followed with the [employer] assigned team member in that program.  For questions contact:   Charlesetta Shanks, RN BSN CCM [facility]  [phone] business mobile phone Toll free office [phone]  *Concierge Line  [phone] Fax number: [phone] [email or web address]

## 2023-03-23 NOTE — Progress Notes (Signed)
Occupational Therapy Treatment Patient Details Name: Monica Zamora MRN: [id number] DOB: Mar 02, 1951 Today's Date: 03/23/2023   History of present illness 72 yo female admitted 6/28 with chest pain, bradycardia, hypotension and acute on chronic CHF. PMHx: Afib on Eliquis, HTN, respiratory failure on 2L, CAD s/p CABG, HFrEF   OT comments  Pt progressing towards goals, needing set up - min A for ADLs, mod I for bed mobility, and supervision- min guard A for transfers with RW. VSS On 2L O2 throughout, pt able to verbalize 2 energy conservation strategies from previous session, reiterated strategies with pt for home. Pt presenting with impairments listed below, will follow acutely. Continue to recommend HHOT at d/c.    Recommendations for follow up therapy are one component of a multi-disciplinary discharge planning process, led by the attending physician.  Recommendations may be updated based on patient status, additional functional criteria and insurance authorization.    Assistance Recommended at Discharge Intermittent Supervision/Assistance  Patient can return home with the following  A little help with walking and/or transfers;A little help with bathing/dressing/bathroom;Assistance with cooking/housework;Direct supervision/assist for medications management;Direct supervision/assist for financial management;Assist for transportation;Help with stairs or ramp for entrance   Equipment Recommendations  None recommended by OT (pt has all needed DME)    Recommendations for Other Services PT consult    Precautions / Restrictions Precautions Precautions: Fall;Other (comment) Precaution Comments: watch sats Restrictions Weight Bearing Restrictions: No       Mobility Bed Mobility Overal bed mobility: Modified Independent                  Transfers Overall transfer level: Needs assistance Equipment used: Rolling walker (2 wheels) Transfers: Sit to/from Stand, Bed to  chair/wheelchair/BSC Sit to Stand: Min guard, Supervision                 Balance Overall balance assessment: Needs assistance Sitting-balance support: Bilateral upper extremity supported, Feet supported Sitting balance-Leahy Scale: Good Sitting balance - Comments: sitting EOB   Standing balance support: Single extremity supported, Bilateral upper extremity supported, During functional activity Standing balance-Leahy Scale: Fair Standing balance comment: reliant on external support                           ADL either performed or assessed with clinical judgement   ADL Overall ADL's : Needs assistance/impaired     Grooming: Set up;Standing Grooming Details (indicate cue type and reason): standing for oral care         Upper Body Dressing : Minimal assistance;Sitting Upper Body Dressing Details (indicate cue type and reason): donning gown on backside     Toilet Transfer: Ambulation;Regular Toilet;Supervision/safety Toilet Transfer Details (indicate cue type and reason): simulated via functional mobility         Functional mobility during ADLs: Min guard;Rolling walker (2 wheels)      Extremity/Trunk Assessment Upper Extremity Assessment Upper Extremity Assessment: Generalized weakness   Lower Extremity Assessment Lower Extremity Assessment: Defer to PT evaluation        Vision   Vision Assessment?: No apparent visual deficits   Perception Perception Perception: Not tested   Praxis Praxis Praxis: Not tested    Cognition Arousal/Alertness: Awake/alert Behavior During Therapy: WFL for tasks assessed/performed Overall Cognitive Status: Within Functional Limits for tasks assessed  Exercises      Shoulder Instructions       General Comments      Pertinent Vitals/ Pain       Pain Assessment Pain Assessment: No/denies pain  Home Living                                           Prior Functioning/Environment              Frequency  Min 1X/week        Progress Toward Goals  OT Goals(current goals can now be found in the care plan section)  Progress towards OT goals: Progressing toward goals  Acute Rehab OT Goals Patient Stated Goal: to go home OT Goal Formulation: With patient Time For Goal Achievement: 03/28/23 Potential to Achieve Goals: Good ADL Goals Pt Will Perform Lower Body Dressing: with modified independence;sitting/lateral leans;sit to/from stand Pt Will Perform Tub/Shower Transfer: Shower transfer;shower seat;ambulating;rolling walker Additional ADL Goal #1: pt will verbalize x3 energy conservation strategies in prep for ADLs  Plan Discharge plan remains appropriate;Frequency remains appropriate    Co-evaluation                 AM-PAC OT "6 Clicks" Daily Activity     Outcome Measure   Help from another person eating meals?: A Little Help from another person taking care of personal grooming?: A Little Help from another person toileting, which includes using toliet, bedpan, or urinal?: A Little Help from another person bathing (including washing, rinsing, drying)?: A Little Help from another person to put on and taking off regular upper body clothing?: A Little Help from another person to put on and taking off regular lower body clothing?: A Little 6 Click Score: 18    End of Session Equipment Utilized During Treatment: Oxygen;Rolling walker (2 wheels);Gait belt (2L)  OT Visit Diagnosis: Unsteadiness on feet (R26.81);Other abnormalities of gait and mobility (R26.89);Muscle weakness (generalized) (M62.81)   Activity Tolerance Patient tolerated treatment well   Patient Left in chair;with call bell/phone within reach   Nurse Communication Mobility status; pt ok to transfer to [facility] independently, does not have chair alarm        Time: 1434-1501 OT Time Calculation (min): 27 min  Charges: OT General  Charges $OT Visit: 1 Visit OT Treatments $Self Care/Home Management : 8-22 mins $Therapeutic Activity: 8-22 mins  Carver Fila, OTD, OTR/L SecureChat Preferred Acute Rehab [phone]   Carver Fila Koonce 03/23/2023, 3:29 PM

## 2023-03-23 NOTE — Progress Notes (Signed)
PROGRESS NOTE  Monica Zamora IEP:[id number] DOB: 09-02-1951 DOA: 03/13/2023 PCP: Rebecka Apley, NP  Brief History   Patient is a 72 year old female, extensive vasculopath without noted significant workup, with history of coronary artery disease status post CABG, hypertension and atrial fibrillation. Patient was admitted with lower sternal/epigastric pain.  Chest pain was said to be radiating to the neck. Associated nausea and vomiting.  Non rising troponin.  EKG revealed A-fib with nonspecific T wave changes.  The patient underwent left and right heart catheterizations on 03/17/2023.  It demonstrated:   Severe combined CHF with severely elevated right-sided pressures: RAP mean 24 mmHg with large mild wave.; RV P-EDP 57/8-17 mmHg. PAP-mean 6/37-42 mmHg; PCWP 35 mmHg with V wave up to 43. Significant respiratory variation. LV P-EDP 118/16-22 mmHg, AO P-MAP 106/73-79 mmHg Ao sat 96, PA sat 54%. CO-CI (Fick) 4.51-2.35; PAPI 0.86 Significant native vessel CAD with 3 of 3 patent grafts: 100% proximal RCA CTO; 100% mLCx; Prox LAD 60% & mLAD ~70% (competetive flow from LIMA-LAD) Patent LIMA-LAD, SVG-PDA, SVG-OM 3 Cardiology's recommendations are for continued diuresis with IV lasix and heart failure service consult due to severe RV failure. She will be continued on IV heparin for now, but will be converted back to DOAC once invasive procedures are completed.   CTA chest/abdomen/pelvis revealed: 1. No evidence of thoracoabdominal aortic aneurysm or dissection. 2. No pulmonary embolism identified. 3. Extensive multi-vessel coronary artery calcification, status post coronary artery bypass grafting. 4. Morphologic changes in keeping with pulmonary arterial hypertension and right heart failure with retrograde opacification of the hepatic venous system. 5. Mosaic attenuation within the pulmonary parenchyma and bronchial wall thickening in keeping with airway inflammation and multifocal air  trapping. 6. 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 Clinical correlation for signs and symptoms of chronic mesenteric ischemia may be helpful. 7. Diffusely diminutive renal arteries bilaterally. Superimposed focal hemodynamically significant stenosis of the right renal artery origin. 8. Peripheral vascular disease with hemodynamically significant stenoses of the common iliac artery origins bilaterally and diffusely diseased lower extremity arterial inflow vasculature. 9. Morphologic changes suggesting cirrhosis. This could be confirmed with hepatic elastography or tissue sampling. Mild ascites.   Aortic Atherosclerosis  Right/left heart cath and coronary/graft angiography revealed:   Prox RCA lesion is 100% stenosed.  Dist Cx lesion is 100% stenosed. -, RPDA lesion is 55% stenosed.   Prox LAD lesion is 60% stenosed.  Mid LAD lesion is 70% stenosed.   LIMA graft was visualized by angiography.   SVG-OM3 graft was visualized by angiography and is normal in caliber.  The graft exhibits no disease.   SVG-rPDA  graft was visualized by angiography and is large.  The graft exhibits no disease.   Hemodynamic findings consistent with moderate pulmonary hypertension with Severe RV Failure.   There is no aortic valve stenosis.       03/18/2023: Creatinine has modestly increased today to 1.36. Will continue to monitor renal status. Cardiology has requested a triple lumen catheter today to follow CVP. Chest pain is felt to be due to decompensated heart failure. Her lasix has been increased to 80 mg IV bid. Recommendations include farxiga 10 mg daily, spironolactone 12.5 daily, Imdur 30 mg daily, and hydralazine 25 mg tid. Consider entresto after Doppler of renal artery is concluded. Intervention may be considered if stenosis is severe or blood pressures are effected. Metoprolol has been changed from tartrate to 50 mg succinate . Heparin will be  switched to eliquis once the procedures are complete.   03/22/2023: Patient seen.  Diuretics have been held due to worsening renal function.  Serum creatinine is 2.36 today.  Continue to monitor renal function. 03/23/2023: Patient seen.  Mild improvement.  Renal function is noted.  Serum creatinine is down to 2.01 (from 2.36).  Lasix is still on hold.  Cardiology input is appreciated.  Objective   Vitals:  Vitals:   03/23/23 1600 03/23/23 1700  BP:    Pulse: (!) 53 (!) 55  Resp: (!) 22   Temp:    SpO2: 97% 97%    Exam:  Constitutional:  The patient is awake, alert, and oriented x 3. No acute distress. Respiratory:  Decreased air entry globally. Cardiovascular:  S1-S2.Marland Kitchen Abdomen:  Abdomen is obese, soft, non-tender. Musculoskeletal:  Leg edema has almost resolved. Neurologic: Awake and alert.  Patient was all extremities.  S   I have personally reviewed the following:    Scheduled Meds:  amiodarone  200 mg Oral Daily   apixaban  5 mg Oral BID   atorvastatin  40 mg Oral Daily   Chlorhexidine Gluconate Cloth  6 each Topical Daily   sertraline  100 mg Oral Daily   sodium chloride flush  3 mL Intravenous Q12H   Continuous Infusions:  sodium chloride     sodium chloride Stopped (03/13/23 0305)    Principal Problem:   Acute on chronic combined systolic and diastolic CHF (congestive heart failure) (HCC) Active Problems:   AF (paroxysmal atrial fibrillation) (HCC)   CAD (coronary artery disease)  A & P  Principal Problem:   Acute on chronic systolic (congestive) heart failure (HCC)   Acute on chronic HFrEF 40-45% -Last 2D echo done on 03/04/2023 revealed LVEF 40-45%, left ventricle global hypokinesis -Patient presented with volume overload on exam, right JVD, bilateral lower extremity pitting edema, BNP greater than 1600 -IV Lasix.   -Start strict I's and O's and daily weight -LHC completed 7/2. Results above. 03/23/2023: Stable.  Cardiology team is directing care.    Chest pain:  Elevated troponin: Troponin flat, 39, repeat 40 For possible left and right heart catheterization tomorrow. Continue to monitor renal function.   -LHC completed 7/2. Results above. 03/23/2023: Resolved.   AKI on CKD 3A: -Baseline creatinine appears to be 1.1 with GFR 51 -Presented with creatinine of 1.64 with GFR of 33 -AKI is likely multifactorial, including, but not limited to ATN/cardiorenal syndrome/exposure to contrast. -Aggressive diuresis. -Avoid hypotension. -Avoid nephrotoxic medications. -Keep MAP greater than 65 mmHg. -Strict I's and O's. -Dose all medications assuming GFR of less than 50 mL/min. -Serum creatinine improved from 1.88 to 1.36 today. 03/19/2023: Cautious use of diuretics.  Serum creatinine of 1.57 noted today. 03/20/2023: Serum creatinine is 1.88 today.  Suspect contrast associated nephropathy.  Check urinalysis and urine sodium.   Elevated liver chemistries, unclear etiology, suspect related to alcohol use -Initially elevated AST, but back to normal. -Continue to monitor for now -Avoid hepatotoxic agents -Patient has a history of heavy alcohol use. She states that she has not had a drink for 2 months.   Permanent atrial fibrillation, currently rate controlled, on Eliquis Initially presented hypotensive and bradycardic, home beta-blocker was reversed with glucagon Resume home Eliquis Closely monitor on telemetry Rate control with metoprolol. Anticipate transition to toprol for discharge.  Renal artery stenosis: On CTa nd ultrasound. Doppler of renal arteries pending.    Chronic anxiety/depression Resume home Zoloft   Chronic hypoxic respiratory failure on 2 L  nasal cannula continuously Appears to be at her baseline oxygen requirement Maintain O2 saturation above 90%   Extensive vascular disease: -Vascular surgery team has been consulted. -Abdominal pain on presentation, but with normal lactic acid.   DVT prophylaxis: Heparin drip. Code  Status: Full code. Family Communication:  Disposition Plan: This will depend on hospital course.     Consultants:  Cardiology.   Procedures:  Cardiac catheterization    Antimicrobials:  None.  Berton Mount, MD [facility] Direct contact: see [email or web address]  7PM-7AM contact night coverage as above 03/18/2023, 7:24 PM  LOS: 4 days     LOS: 10 days

## 2023-03-23 NOTE — Plan of Care (Signed)

## 2023-03-23 NOTE — Progress Notes (Signed)
Patient ID: Monica Zamora, female   DOB: [birth year], 72 y.o.   MRN: [id number]     Advanced Heart Failure Rounding Note  PCP-Cardiologist: Olga Millers, MD   Subjective:    TEE/DCCV 7/5 to NSR, remains in NSR today.  TEE showed EF 30-35%, moderate RV dysfunction, mod-severe TR, IVC dilated.  Creatinine 1.36 => 1.57 => 1.88 => 1.95 => 2.36=> 2.01.  Lasix gtt stopped 7/6. Weight overall down 19lbs.  CVP 8/9  Feels fine this morning, ready to go home. Denies CP/SOB.   Objective:   Weight Range: 73.9 kg Body mass index is 26.3 kg/m.   Vital Signs:   Temp:  [97.6 F (36.4 C)-98.4 F (36.9 C)] 97.8 F (36.6 C) (07/08 0738) Pulse Rate:  [50-63] 63 (07/08 0738) Resp:  [16-20] 17 (07/08 0738) BP: (115-135)/(44-59) 135/54 (07/08 0738) SpO2:  [92 %-98 %] 95 % (07/08 0738) Weight:  [73.9 kg] 73.9 kg (07/08 0428) Last BM Date : 03/22/23  Weight change: Filed Weights   03/21/23 0500 03/22/23 0541 03/23/23 0428  Weight: 76 kg 74.8 kg 73.9 kg   Intake/Output:   Intake/Output Summary (Last 24 hours) at 03/23/2023 1153 Last data filed at 03/23/2023 0420 Gross per 24 hour  Intake 200 ml  Output 900 ml  Net -700 ml    Physical Exam  CVP 8/9 General:  elderly appearing.  No respiratory difficulty HEENT: normal. +Hasty Neck: supple. JVD ~8 cm. Carotids 2+ bilat; no bruits. No lymphadenopathy or thyromegaly appreciated. Cor: PMI nondisplaced. Regular rate & rhythm. No rubs, gallops or murmurs. Lungs: clear Abdomen: soft, nontender, nondistended. No hepatosplenomegaly. No bruits or masses. Good bowel sounds. Extremities: no cyanosis, clubbing, rash, trace BLE edema. PICC RUE  Neuro: alert & oriented x 3, cranial nerves grossly intact. moves all 4 extremities w/o difficulty. Affect pleasant.  Telemetry   NSR 50s (personally reviewed)  Labs    CBC Recent Labs    03/21/23 0524 03/22/23 0526  WBC 4.1 4.4  HGB 9.6* 10.1*  HCT 31.4* 32.2*  MCV 107.5* 107.3*  PLT 160 175    Basic Metabolic Panel Recent Labs    40/98/11 0526 03/23/23 0500  NA 135 135  K 3.2* 3.0*  CL 90* 90*  CO2 33* 33*  GLUCOSE 97 103*  BUN 47* 46*  CREATININE 2.36* 2.01*  CALCIUM 9.1 8.5*  PHOS 6.2* 4.9*   Liver Function Tests Recent Labs    03/22/23 0526 03/23/23 0500  ALBUMIN 3.4* 3.3*    No results for input(s): "LIPASE", "AMYLASE" in the last 72 hours. Cardiac Enzymes No results for input(s): "CKTOTAL", "CKMB", "CKMBINDEX", "TROPONINI" in the last 72 hours.  BNP: BNP (last 3 results) Recent Labs    03/13/23 0109  BNP 1,662.5*    ProBNP (last 3 results) No results for input(s): "PROBNP" in the last 8760 hours.   D-Dimer No results for input(s): "DDIMER" in the last 72 hours. Hemoglobin A1C No results for input(s): "HGBA1C" in the last 72 hours. Fasting Lipid Panel No results for input(s): "CHOL", "HDL", "LDLCALC", "TRIG", "CHOLHDL", "LDLDIRECT" in the last 72 hours. Thyroid Function Tests No results for input(s): "TSH", "T4TOTAL", "T3FREE", "THYROIDAB" in the last 72 hours.  Invalid input(s): "FREET3"  Other results:  Imaging   No results found.  Medications:   Scheduled Medications:  amiodarone  200 mg Oral Daily   apixaban  5 mg Oral BID   atorvastatin  40 mg Oral Daily   Chlorhexidine Gluconate Cloth  6 each Topical Daily  sertraline  100 mg Oral Daily   sodium chloride flush  10-40 mL Intracatheter Q12H   sodium chloride flush  3 mL Intravenous Q12H   sodium chloride flush  3 mL Intravenous Q12H    Infusions:  sodium chloride     sodium chloride Stopped (03/13/23 0305)    PRN Medications: sodium chloride, acetaminophen, ALPRAZolam, loperamide, melatonin, ondansetron (ZOFRAN) IV, polyethylene glycol, prochlorperazine, sodium chloride flush, sodium chloride flush  Assessment/Plan  1. Acute on chronic systolic CHF:  Echo this admission with EF 35-40%, moderately decreased RV systolic function.  RHC this admission with biventricular  elevated filling pressures, low PAPi but preserved CI.  Pulmonary venous hypertension.  Suspect ischemic cardiomyopathy + component of cor pulmonale from COPD.  TEE 7/5 with EF 30-35%, moderate RV dysfunction, mod-severe TR, IVC dilated.  She has diuresed on Lasix gtt + acetazolamide with CVP 8/9 today.  SCr down trending at 1.88 => 1.95 => 2.36=> 2.01.   Co-ox 72%.  - Now off lasix gtt. CVP 8/9. Plan to start PO diuretic tomorrow (was on 60 lasix daily at home). Weight still down trending and decent UOP.  - Now off Toprol XL with volume overload and bradycardia post-DCCV. Marcelline Deist and spiro held with AKI, plan to restart once renal function improves - She has significant bilateral renal artery stenosis by prior CTA but renal artery doppler does not suggest hemodynamic significance.  Could probably use ARB or Entresto in the future but would like to see stable creatinine.  2. CKD stage 3: Creatinine higher 1.36 => 1.57 => 1.88 => 1.95 => 2.36=>2.01 - With CVP 8/9, keep off diuretic for now.  3. Atrial fibrillation: History of PAF.  AF this admission, was in NSR in 5/24.  Probably not a good long-term amiodarone candidate with severe COPD on home oxygen.  Amiodarone gtt begun this admission for rate control with rapid AF, now back in NSR after DCCV.  - Off Toprol XL with bradycardia and volume overload.   - Continue amiodarone 200 daily to keep her in NSR for now. Would not continue this medication long-term given her lung disease, stop at discharge most likely.  - Continue Eliquis.   - May be a candidate for Tikosyn in future to maintain NSR.  4. CAD s/p CABG: Cath 7/2 with patent grafts.  No ACS. No chest pain.  - Continue statin.  5. COPD: Severe, on home oxygen.  6. Renal artery stenosis: By CTA.  Renal artery dopplers suggested 1-59% bilateral ICA stenosis.   7. Mesenteric artery stenosis: No intestinal angina reported.   Mobilize.   Alen Bleacher AGACNP-BC  03/23/2023 11:53 AM

## 2023-03-24 DIAGNOSIS — I5043 Acute on chronic combined systolic (congestive) and diastolic (congestive) heart failure: Secondary | ICD-10-CM | POA: Diagnosis not present

## 2023-03-24 LAB — RENAL FUNCTION PANEL
Albumin: 3.2 g/dL — ABNORMAL LOW (ref 3.5–5.0)
Anion gap: 8 (ref 5–15)
BUN: 37 mg/dL — ABNORMAL HIGH (ref 8–23)
CO2: 31 mmol/L (ref 22–32)
Calcium: 8.9 mg/dL (ref 8.9–10.3)
Chloride: 97 mmol/L — ABNORMAL LOW (ref 98–111)
Creatinine, Ser: 1.55 mg/dL — ABNORMAL HIGH (ref 0.44–1.00)
GFR, Estimated: 36 mL/min — ABNORMAL LOW (ref >60)
Glucose, Bld: 100 mg/dL — ABNORMAL HIGH (ref 70–99)
Phosphorus: 3.6 mg/dL (ref 2.5–4.6)
Potassium: 3.5 mmol/L (ref 3.5–5.1)
Sodium: 136 mmol/L (ref 135–145)

## 2023-03-24 LAB — COOXEMETRY PANEL
Carboxyhemoglobin: 2.8 % — ABNORMAL HIGH (ref 0.5–1.5)
Methemoglobin: 0.8 % (ref 0.0–1.5)
O2 Saturation: 74.4 %
Total hemoglobin: 11.4 g/dL — ABNORMAL LOW (ref 12.0–16.0)

## 2023-03-24 MED ORDER — SPIRONOLACTONE 12.5 MG HALF TABLET
12.5000 mg | ORAL_TABLET | Freq: Every day | ORAL | Status: DC
  Administered 2023-03-24 – 2023-03-25 (×2): 12.5 mg via ORAL
  Filled 2023-03-24 (×2): qty 1

## 2023-03-24 NOTE — Progress Notes (Addendum)
Patient ID: Monica Zamora, female   DOB: November 21, 1950, 72 y.o.   MRN: [id number]     Advanced Heart Failure Rounding Note  PCP-Cardiologist: Olga Millers, MD   Subjective:    S/p TEE/DCCV 7/5 to NSR. TEE showed EF 30-35%, moderate RV dysfunction, mod-severe TR, IVC dilated.    Maintaining NSR.   AKI improving, Creatinine 1.36 => 1.57 => 1.88 => 1.95 => 2.36=> 2.01=>1.55 today.   CVP 6. Co-ox 74%   Feels well today. Breathing much improved. Feels better. Wants to go home.   Objective:   Weight Range: 73.4 kg Body mass index is 26.12 kg/m.   Vital Signs:   Temp:  [97 F (36.1 C)-98.5 F (36.9 C)] 98.2 F (36.8 C) (07/09 0746) Pulse Rate:  [53-61] 56 (07/09 0746) Resp:  [17-22] 18 (07/08 2330) BP: (114-132)/(43-63) 132/59 (07/09 0746) SpO2:  [93 %-99 %] 93 % (07/09 0746) Weight:  [73.4 kg] 73.4 kg (07/09 0621) Last BM Date : 03/22/23  Weight change: Filed Weights   03/22/23 0541 03/23/23 0428 03/24/23 0621  Weight: 74.8 kg 73.9 kg 73.4 kg   Intake/Output:   Intake/Output Summary (Last 24 hours) at 03/24/2023 0921 Last data filed at 03/24/2023 0800 Gross per 24 hour  Intake 360 ml  Output 1400 ml  Net -1040 ml    Physical Exam   CVP 6 General:  Well appearing. No respiratory difficulty HEENT: normal Neck: supple. JVD 6 cm. Carotids 2+ bilat; no bruits. No lymphadenopathy or thyromegaly appreciated. Cor: PMI nondisplaced. Regular rate & rhythm. No rubs, gallops or murmurs. Lungs: clear Abdomen: soft, nontender, nondistended. No hepatosplenomegaly. No bruits or masses. Good bowel sounds. Extremities: no cyanosis, clubbing, rash, edema + RUE PICC  Neuro: alert & oriented x 3, cranial nerves grossly intact. moves all 4 extremities w/o difficulty. Affect pleasant.   Telemetry   NSR 60s (personally reviewed)  Labs    CBC Recent Labs    03/22/23 0526  WBC 4.4  HGB 10.1*  HCT 32.2*  MCV 107.3*  PLT 175   Basic Metabolic Panel Recent Labs     03/23/23 0500 03/24/23 0530  NA 135 136  K 3.0* 3.5  CL 90* 97*  CO2 33* 31  GLUCOSE 103* 100*  BUN 46* 37*  CREATININE 2.01* 1.55*  CALCIUM 8.5* 8.9  PHOS 4.9* 3.6   Liver Function Tests Recent Labs    03/23/23 0500 03/24/23 0530  ALBUMIN 3.3* 3.2*    No results for input(s): "LIPASE", "AMYLASE" in the last 72 hours. Cardiac Enzymes No results for input(s): "CKTOTAL", "CKMB", "CKMBINDEX", "TROPONINI" in the last 72 hours.  BNP: BNP (last 3 results) Recent Labs    03/13/23 0109  BNP 1,662.5*    ProBNP (last 3 results) No results for input(s): "PROBNP" in the last 8760 hours.   D-Dimer No results for input(s): "DDIMER" in the last 72 hours. Hemoglobin A1C No results for input(s): "HGBA1C" in the last 72 hours. Fasting Lipid Panel No results for input(s): "CHOL", "HDL", "LDLCALC", "TRIG", "CHOLHDL", "LDLDIRECT" in the last 72 hours. Thyroid Function Tests No results for input(s): "TSH", "T4TOTAL", "T3FREE", "THYROIDAB" in the last 72 hours.  Invalid input(s): "FREET3"  Other results:  Imaging   No results found.  Medications:   Scheduled Medications:  amiodarone  200 mg Oral Daily   apixaban  5 mg Oral BID   atorvastatin  40 mg Oral Daily   Chlorhexidine Gluconate Cloth  6 each Topical Daily   sertraline  100 mg Oral  Daily   sodium chloride flush  3 mL Intravenous Q12H    Infusions:  sodium chloride     sodium chloride Stopped (03/13/23 0305)    PRN Medications: sodium chloride, acetaminophen, ALPRAZolam, loperamide, melatonin, ondansetron (ZOFRAN) IV, polyethylene glycol, prochlorperazine, sodium chloride flush  Assessment/Plan  1. Acute on chronic systolic CHF:  Echo this admission with EF 35-40%, moderately decreased RV systolic function.  RHC this admission with biventricular elevated filling pressures, low PAPi but preserved CI.  Pulmonary venous hypertension.  Suspect ischemic cardiomyopathy + component of cor pulmonale from COPD.  TEE 7/5  with EF 30-35%, moderate RV dysfunction, mod-severe TR, IVC dilated.  She has diuresed on Lasix gtt + acetazolamide with CVP 6 today.  AKI improving, SCr down trending at 1.88 => 1.95 => 2.36=> 2.01=>1.55.   Co-ox 74%.  - Restart PO diuretic, Lasix 60 lasix daily starting tomorrow  - Now off Toprol XL with bradycardia post-DCCV. Marcelline Deist and spiro held with AKI, now improving - Restart spiro 12.5 mg daily  - She has significant bilateral renal artery stenosis by prior CTA but renal artery doppler does not suggest hemodynamic significance.  Could probably use ARB or Entresto in the future but would like to see stable creatinine.  2. AKI on CKD stage 3: Creatinine higher 1.36 => 1.57 => 1.88 => 1.95 => 2.36=>2.01=>1.55  -  CVP 6.  - watch renal fx w/ restart of diuretics/ GDMT  3. Atrial fibrillation: History of PAF.  AF this admission, was in NSR in 5/24.  Probably not a good long-term amiodarone candidate with severe COPD on home oxygen.  Amiodarone gtt begun this admission for rate control with rapid AF, now back in NSR after DCCV.  - Off Toprol XL with bradycardia and volume overload.   - Continue amiodarone 200 daily to keep her in NSR for now. Would not continue this medication long-term given her lung disease - Continue Eliquis.   - May be a candidate for Tikosyn in future to maintain NSR.  4. CAD s/p CABG: Cath 7/2 with patent grafts.  No ACS. No chest pain.  - Continue statin.  5. COPD: Severe, on home oxygen.  6. Renal artery stenosis: By CTA.  Renal artery dopplers suggested 1-59% bilateral ICA stenosis.   7. Mesenteric artery stenosis: No intestinal angina reported.   Ok for d/c home from cardiac standpoint  Cardiac meds for discharge  - amiodarone 200 mg daily - Eliquis 5 mg bid  - atorvastatin 40 mg daily  - spironolactone 12.5 mg daily  - Lasix 60 mg daily starting 7/10   She has f/u arranged in the [facility] in 1 wk. Will check f/u BMP at visit    Robbie Lis PA-C   03/24/2023 9:21 AM

## 2023-03-24 NOTE — Progress Notes (Signed)
PROGRESS NOTE  Monica Zamora ZOX:[id number] DOB: 25-Mar-1951 DOA: 03/13/2023 PCP: Rebecka Apley, NP  Brief History   Patient is a 72 year old female, extensive vasculopath without noted significant workup, with history of coronary artery disease status post CABG, hypertension and atrial fibrillation. Patient was admitted with lower sternal/epigastric pain.  Chest pain was said to be radiating to the neck. Associated nausea and vomiting.  Non rising troponin.  EKG revealed A-fib with nonspecific T wave changes.  The patient underwent left and right heart catheterizations on 03/17/2023.   Left and right cardiac catheterization revealed: Severe combined CHF with severely elevated right-sided pressures: RAP mean 24 mmHg with large mild wave.; RV P-EDP 57/8-17 mmHg. PAP-mean 6/37-42 mmHg; PCWP 35 mmHg with V wave up to 43. Significant respiratory variation. LV P-EDP 118/16-22 mmHg, AO P-MAP 106/73-79 mmHg Ao sat 96, PA sat 54%. CO-CI (Fick) 4.51-2.35; PAPI 0.86 Significant native vessel CAD with 3 of 3 patent grafts: 100% proximal RCA CTO; 100% mLCx; Prox LAD 60% & mLAD ~70% (competetive flow from LIMA-LAD) Patent LIMA-LAD, SVG-PDA, SVG-OM 3 Cardiology's recommendations are for continued diuresis with IV lasix and heart failure service consult due to severe RV failure. She will be continued on IV heparin for now, but will be converted back to DOAC once invasive procedures are completed.  03/24/2023: IV Lasix has been on hold due to worsening renal function.  Likely resume home diuretics on discharge.  Renal function has continued to improve.  Chest pain and epigastric pain have resolved.  Heparin has been discontinued.   CTA chest/abdomen/pelvis revealed: 1. No evidence of thoracoabdominal aortic aneurysm or dissection. 2. No pulmonary embolism identified. 3. Extensive multi-vessel coronary artery calcification, status post coronary artery bypass grafting. 4. Morphologic changes in keeping  with pulmonary arterial hypertension and right heart failure with retrograde opacification of the hepatic venous system. 5. Mosaic attenuation within the pulmonary parenchyma and bronchial wall thickening in keeping with airway inflammation and multifocal air trapping. 6. 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 Clinical correlation for signs and symptoms of chronic mesenteric ischemia may be helpful. 7. Diffusely diminutive renal arteries bilaterally. Superimposed focal hemodynamically significant stenosis of the right renal artery origin. 8. Peripheral vascular disease with hemodynamically significant stenoses of the common iliac artery origins bilaterally and diffusely diseased lower extremity arterial inflow vasculature. 9. Morphologic changes suggesting cirrhosis. This could be confirmed with hepatic elastography or tissue sampling. Mild ascites.   Aortic Atherosclerosis     Objective   Vitals:  Vitals:   03/24/23 1300 03/24/23 1655  BP: (!) 107/32 (!) 134/52  Pulse:  63  Resp: 19 20  Temp: 97.6 F (36.4 C) 97.9 F (36.6 C)  SpO2: 95% 99%    Exam:  Constitutional:  The patient is awake, alert, and oriented x 3. No acute distress. Respiratory:  Decreased air entry globally. Cardiovascular:  S1-S2.Marland Kitchen Abdomen:  Abdomen is obese, soft, non-tender. Musculoskeletal:  Leg edema has almost resolved. Neurologic: Awake and alert.  Patient was all extremities.  S   I have personally reviewed the following:    Scheduled Meds:  amiodarone  200 mg Oral Daily   apixaban  5 mg Oral BID   atorvastatin  40 mg Oral Daily   Chlorhexidine Gluconate Cloth  6 each Topical Daily   sertraline  100 mg Oral Daily   sodium chloride flush  3 mL Intravenous Q12H   spironolactone  12.5 mg Oral Daily   Continuous Infusions:  sodium chloride     sodium chloride Stopped (03/13/23 0305)    Principal Problem:   Acute on  chronic combined systolic and diastolic CHF (congestive heart failure) (HCC) Active Problems:   AF (paroxysmal atrial fibrillation) (HCC)   CAD (coronary artery disease)  A & P  Principal Problem:   Acute on chronic systolic (congestive) heart failure (HCC)   Acute on chronic HFrEF 40-45% -Last 2D echo done on 03/04/2023 revealed LVEF 40-45%, left ventricle global hypokinesis -Patient presented with volume overload on exam, right JVD, bilateral lower extremity pitting edema, BNP greater than 1600 -IV Lasix.   -Start strict I's and O's and daily weight -LHC completed 7/2. Results above. 03/24/2023: Diuretics have been on hold due to worsening renal function.  Likely discharge home on home diuretic dose.  Cardiology to advise DC medication..   Chest pain:  Elevated troponin: Troponin flat, 39, repeat 40 For possible left and right heart catheterization tomorrow. Continue to monitor renal function.   -LHC completed 7/2. Results above. 03/23/2023: Resolved.   AKI on CKD 3A: -Baseline creatinine appears to be 1.1 with GFR 51 -Presented with creatinine of 1.64 with GFR of 33 -AKI is likely multifactorial, including, but not limited to ATN/cardiorenal syndrome/exposure to contrast. -Aggressive diuresis. -Avoid hypotension. -Avoid nephrotoxic medications. -Keep MAP greater than 65 mmHg. -Strict I's and O's. -Dose all medications assuming GFR of less than 50 mL/min. -Serum creatinine improved from 1.88 to 1.36 today. 03/19/2023: Cautious use of diuretics.  Serum creatinine of 1.57 noted today. 03/20/2023: Serum creatinine is 1.88 today.  Suspect contrast associated nephropathy.  Check urinalysis and urine sodium. 03/24/2023: AKI slowly improving with discontinuation of IV Lasix.   Elevated liver chemistries, unclear etiology, suspect related to alcohol use -Initially elevated AST, but back to normal. -Continue to monitor for now -Avoid hepatotoxic agents -Patient has a history of heavy  alcohol use. She states that she has not had a drink for 2 months.   Permanent atrial fibrillation, currently rate controlled, on Eliquis Initially presented hypotensive and bradycardic, home beta-blocker was reversed with glucagon Resume home Eliquis Closely monitor on telemetry Rate control with metoprolol. Anticipate transition to toprol for discharge.  Renal artery stenosis: On CTa nd ultrasound. Doppler of renal arteries pending.    Chronic anxiety/depression Resume home Zoloft   Chronic hypoxic respiratory failure on 2 L nasal cannula continuously Appears to be at her baseline oxygen requirement Maintain O2 saturation above 90%   Extensive vascular disease: -Vascular surgery team has been consulted. -Abdominal pain on presentation, but with normal lactic acid.   DVT prophylaxis: Heparin drip. Code Status: Full code. Family Communication:  Disposition Plan: This will depend on hospital course.     Consultants:  Cardiology.   Procedures:  Cardiac catheterization    Antimicrobials:  None.  Berton Mount, MD [facility] Direct contact: see [email or web address]  7PM-7AM contact night coverage as above 03/18/2023, 7:24 PM  LOS: 4 days     LOS: 11 days

## 2023-03-24 NOTE — Plan of Care (Signed)
  Problem: Health Behavior/Discharge Planning: Goal: Ability to manage health-related needs will improve Outcome: Progressing   

## 2023-03-24 NOTE — Progress Notes (Signed)
Physical Therapy Treatment Patient Details Name: Monica Zamora MRN: [id number] DOB: [birth year] Today's Date: 03/24/2023   History of Present Illness 72 yo female admitted 6/28 with chest pain, bradycardia, hypotension and acute on chronic CHF. PMHx: Afib on Eliquis, HTN, respiratory failure on 2L, CAD s/p CABG, HFrEF    PT Comments  Pt received in supine and agreeable to session. Pt able to tolerate increased gait distance this session with no reports of DOE. O2 sats noted to drop, however reading is questionable. Pt taking standing rest breaks and performing pursed lip breathing with cues. Pt requiring assist with lines throughout session, but no physical assist for mobility tasks. Education provided on activity pacing and activity progression at home once discharged. Pt continues to benefit from PT services to progress toward functional mobility goals.     Assistance Recommended at Discharge Intermittent Supervision/Assistance  If plan is discharge home, recommend the following:  Can travel by private vehicle    A little help with bathing/dressing/bathroom;Assistance with cooking/housework;Assist for transportation      Equipment Recommendations  BSC/3in1    Recommendations for Other Services       Precautions / Restrictions Precautions Precautions: Fall;Other (comment) Precaution Comments: watch sats Restrictions Weight Bearing Restrictions: No     Mobility  Bed Mobility Overal bed mobility: Modified Independent                  Transfers Overall transfer level: Needs assistance Equipment used: Rolling walker (2 wheels) Transfers: Sit to/from Stand Sit to Stand: Min guard           General transfer comment: Cues for hand placement and min guard for safety    Ambulation/Gait Ambulation/Gait assistance: Min guard Gait Distance (Feet): 200 Feet Assistive device: Rolling walker (2 wheels) Gait Pattern/deviations: Step-through pattern, Decreased stride  length       General Gait Details: Slow step-through pattern with min guard for safety. 2 standing rest breaks due to questionable SpO2 readings, but pt reporting no DOE.      Balance Overall balance assessment: Needs assistance Sitting-balance support: Bilateral upper extremity supported, Feet supported Sitting balance-Leahy Scale: Good Sitting balance - Comments: sitting EOB   Standing balance support: Bilateral upper extremity supported, During functional activity Standing balance-Leahy Scale: Fair Standing balance comment: with RW support                            Cognition Arousal/Alertness: Awake/alert Behavior During Therapy: WFL for tasks assessed/performed Overall Cognitive Status: Within Functional Limits for tasks assessed                                          Exercises      General Comments General comments (skin integrity, edema, etc.): Pt on 2L throughout session and SpO2 readings questionable during ambulation with readings as low as 79%, however unstable pleth and pt with no DOE. SpO2 93% at end of session.      Pertinent Vitals/Pain Pain Assessment Pain Assessment: No/denies pain     PT Goals (current goals can now be found in the care plan section) Acute Rehab PT Goals Patient Stated Goal: return to vacuuming and walking more PT Goal Formulation: With patient Time For Goal Achievement: 03/27/23 Potential to Achieve Goals: Good Progress towards PT goals: Progressing toward goals    Frequency    Min  1X/week      PT Plan Current plan remains appropriate       AM-PAC PT "6 Clicks" Mobility   Outcome Measure  Help needed turning from your back to your side while in a flat bed without using bedrails?: None Help needed moving from lying on your back to sitting on the side of a flat bed without using bedrails?: None Help needed moving to and from a bed to a chair (including a wheelchair)?: A Little Help needed  standing up from a chair using your arms (e.g., wheelchair or bedside chair)?: A Little Help needed to walk in hospital room?: A Little Help needed climbing 3-5 steps with a railing? : A Little 6 Click Score: 20    End of Session Equipment Utilized During Treatment: Oxygen Activity Tolerance: Patient tolerated treatment well Patient left: with call bell/phone within reach;in chair Nurse Communication: Mobility status PT Visit Diagnosis: Other abnormalities of gait and mobility (R26.89);Difficulty in walking, not elsewhere classified (R26.2)     Time: 1610-9604 PT Time Calculation (min) (ACUTE ONLY): 23 min  Charges:    $Gait Training: 23-37 mins PT General Charges $$ ACUTE PT VISIT: 1 Visit                     Johny Shock, PTA Acute Rehabilitation Services Secure Chat Preferred  [phone]    Johny Shock 03/24/2023, 12:16 PM

## 2023-03-24 NOTE — Progress Notes (Signed)
Mobility Specialist Progress Note:   03/24/23 1701  Mobility  Activity Ambulated with assistance in hallway  Level of Assistance Contact guard assist, steadying assist  Assistive Device Four wheel walker  Distance Ambulated (ft) 100 ft  Activity Response Tolerated well  Mobility Referral Yes  $Mobility charge 1 Mobility  Mobility Specialist Start Time (ACUTE ONLY) 1555  Mobility Specialist Stop Time (ACUTE ONLY) 1620  Mobility Specialist Time Calculation (min) (ACUTE ONLY) 25 min    During Mobility: 92% SpO2 3 L Post Mobility: 66 HR , 97% SpO2  Pt received in bed, agreeable to mobility. Denied any feelings of pain or Sob during ambulation. Asymptomatic throughout. Pt returned to bed with call bell in hand and all needs met.   Leory Plowman  Mobility Specialist Please contact via [employer] office at [phone]

## 2023-03-25 ENCOUNTER — Other Ambulatory Visit (HOSPITAL_COMMUNITY): Payer: Self-pay

## 2023-03-25 DIAGNOSIS — N179 Acute kidney failure, unspecified: Secondary | ICD-10-CM

## 2023-03-25 DIAGNOSIS — I2510 Atherosclerotic heart disease of native coronary artery without angina pectoris: Secondary | ICD-10-CM | POA: Insufficient documentation

## 2023-03-25 DIAGNOSIS — I5023 Acute on chronic systolic (congestive) heart failure: Secondary | ICD-10-CM | POA: Diagnosis not present

## 2023-03-25 DIAGNOSIS — I739 Peripheral vascular disease, unspecified: Secondary | ICD-10-CM

## 2023-03-25 DIAGNOSIS — I480 Paroxysmal atrial fibrillation: Secondary | ICD-10-CM

## 2023-03-25 DIAGNOSIS — N189 Chronic kidney disease, unspecified: Secondary | ICD-10-CM

## 2023-03-25 DIAGNOSIS — F32A Depression, unspecified: Secondary | ICD-10-CM

## 2023-03-25 DIAGNOSIS — E785 Hyperlipidemia, unspecified: Secondary | ICD-10-CM

## 2023-03-25 LAB — BASIC METABOLIC PANEL
Anion gap: 7 (ref 5–15)
BUN: 36 mg/dL — ABNORMAL HIGH (ref 8–23)
CO2: 27 mmol/L (ref 22–32)
Calcium: 8.7 mg/dL — ABNORMAL LOW (ref 8.9–10.3)
Chloride: 101 mmol/L (ref 98–111)
Creatinine, Ser: 1.73 mg/dL — ABNORMAL HIGH (ref 0.44–1.00)
GFR, Estimated: 31 mL/min — ABNORMAL LOW (ref >60)
Glucose, Bld: 99 mg/dL (ref 70–99)
Potassium: 3.5 mmol/L (ref 3.5–5.1)
Sodium: 135 mmol/L (ref 135–145)

## 2023-03-25 LAB — COOXEMETRY PANEL
Carboxyhemoglobin: 2 % — ABNORMAL HIGH (ref 0.5–1.5)
Methemoglobin: 0.7 % (ref 0.0–1.5)
O2 Saturation: 80.7 %
Total hemoglobin: 11.7 g/dL — ABNORMAL LOW (ref 12.0–16.0)

## 2023-03-25 MED ORDER — FUROSEMIDE 20 MG PO TABS
60.0000 mg | ORAL_TABLET | Freq: Every day | ORAL | 0 refills | Status: DC
  Filled 2023-03-25: qty 90, 30d supply, fill #0

## 2023-03-25 MED ORDER — SPIRONOLACTONE 25 MG PO TABS
12.5000 mg | ORAL_TABLET | Freq: Every day | ORAL | 0 refills | Status: DC
  Filled 2023-03-25: qty 15, 30d supply, fill #0

## 2023-03-25 MED ORDER — AMIODARONE HCL 200 MG PO TABS
200.0000 mg | ORAL_TABLET | Freq: Every day | ORAL | 0 refills | Status: DC
  Filled 2023-03-25: qty 30, 30d supply, fill #0

## 2023-03-25 MED ORDER — FUROSEMIDE 40 MG PO TABS
60.0000 mg | ORAL_TABLET | Freq: Every day | ORAL | Status: DC
  Administered 2023-03-25: 60 mg via ORAL
  Filled 2023-03-25: qty 1

## 2023-03-25 NOTE — Assessment & Plan Note (Addendum)
SP CABG, currently with patent grafts.  Plan to continue statin therapy. Continue aspirin.  Acute coronary syndrome was ruled out during this hospitalization.

## 2023-03-25 NOTE — Assessment & Plan Note (Signed)
Continue with sertraline and alprazolam.

## 2023-03-25 NOTE — Discharge Summary (Signed)
Physician Discharge Summary   Patient: Monica Zamora MRN: [id number] DOB: 1950/10/01  Admit date:     03/13/2023  Discharge date: 03/25/23  Discharge Physician: Monica Zamora   PCP: Monica Apley, NP   Recommendations at discharge:    Patient has been placed on amiodarone for rate and rhythm control atrial fibrillation. Discontinue metoprolol to prevent bradycardia.  Started on spironolactone. Discontinued K supplements.  Follow up renal function in 7 days as outpatient.  Patient will benefit from ACE inh or ARB for renal artery stenosis, when GFR more stable.  Furosemide increased to 60 mg po daily. Follow up with Monica Seller NP in 7 to 10 days. Follow up with Cardiology as scheduled.   Discharge Diagnoses: Principal Problem:   Acute on chronic systolic CHF (congestive heart failure) (HCC) Active Problems:   Acute kidney injury superimposed on chronic kidney disease (HCC)   Paroxysmal atrial fibrillation (HCC)   CAD (coronary artery disease)   Essential hypertension   Hyperlipidemia   COPD (chronic obstructive pulmonary disease) (HCC)   PVD (peripheral vascular disease) (HCC)  Resolved Problems:   * No resolved hospital problems. [facility] Course: Monica Zamora was admitted to the hospital with the working diagnosis of acute diastolic heart decompensation.   72 yo female with the past medical history of paroxysmal atrial fibrillation, hypertension, coronary artery disease, heart failure and chronic hypoxemic respiratory failure who presented with chest pain. Reported sudden onset of precordial chest pain radiated to the right neck and right shoulder, associated with exertional dyspnea. EMS was called and she was transported to the ED. On her initial physical examination she was hypotensive with blood pressure systolic in th 70's, and bradycardic with HR 30 to 40, blood pressure 107/785, 02 saturation 93%, lungs with rales at bases, heart with S1  and S2 present irregularly irregular, positive JVD, abdomen with no distention and positive lower extremity edema.   Na 140, K 3,9 Cl 106 bicarbonate 19, glucose 113, bun 21 cr 1.65,  AST 121, ALT 42  BNP 1,662 High sensitive troponin 39 and 40  Wbc 6,6 hgb 11.4 plt 255   Chest radiograph with cardiomegaly and bilateral hilar vascular congestion, no infiltrates and no effusions. Sternotomy wires in place.   CT chest with no infiltrates or effusions, no pulmonary embolism.  No evidence of thoracoabdominal aortic aneurysm or dissection. Extensive multivessel coronary artery calcification sp CABG. Occlusion of the celiac origin and high grade stenosis of the inferior mesenteric artery region.  Significant right renal artery stenosis.  Significant stenosis common iliac artery origins bilaterally.  Morphologic changes suggesting cirrhosis.   EKG 96 bpm, normal axis, normal qtc, atrial fibrillation rhythm with no significant ST segment changes, negative T wave lead I and AvL (new compared to 01/2023).   Patient was diuresed with good response.  Had further work up with right and left heart catheterization.   Assessment and Plan: * Acute on chronic systolic CHF (congestive heart failure) (HCC) Echocardiogram with reduced LV systolic function EF 35 to 40%, global hypokinesis, mild LVH, RV systolic function with moderate reduction, RVSP 52.2. moderate TR   07/02 cardiac catheterization.  Significant native CAD, with 3 of 3 patent grafts.  PAP mean 42 PCWP 35 Cardiac output 4,5 and index 2,35 (Fick).  Patient underwent diuresis (furosemide, acetazolamide), negative fluid balance was achieved, -5442 ml, with significant improvement in her symptoms.  PICC line was placed to guide diuresis.     Acute kidney injury superimposed on chronic  kidney disease (HCC) CKD stage 3a.   Patient underwent diuresis with improvement in volume status.  At the time of her discharge her serum cr is 1,73 with  K at 3,5 and serum bicarbonate at 27.  Patient will continue diuresis with furosemide and added spironolactone.  Close follow up renal function and electrolytes as outpatient . Hold on K supplementation for now.   Right renal artery stenosis. Patient will benefit from ACE inh or ARB with close follow up on renal function.   Paroxysmal atrial fibrillation (HCC) Patient was placed on IV amiodarone for rate control. She underwent TEE/ direct current cardioversion, on 07/05, converting to sinus rhythm. Patient will continue amiodarone 200 mg daily and anticoagulation with apixaban.  Discontinue B blocker to prevent bradycardia.   CAD (coronary artery disease) SP CABG, currently with patent grafts.  Plan to continue statin therapy. Continue aspirin.  Acute coronary syndrome was ruled out during this hospitalization.   Essential hypertension Continue blood pressure monitoring.  Patient on spironolactone.   Hyperlipidemia Continue statin therapy.   COPD (chronic obstructive pulmonary disease) (HCC) Chronic hypoxemic respiratory failure, no signs of acute exacerbation.  Continue supplemental 02 at home per [place] and as needed bronchodilator therapy.   PVD (peripheral vascular disease) (HCC) Continue statin and close blood pressure monitoring.          Consultants: Cardiology  Procedures performed: cardiac catheterization, right and left. Direct current cardioversion. TEE   Disposition: Home Diet recommendation:  Cardiac diet DISCHARGE MEDICATION: Allergies as of 03/25/2023   No Known Allergies      Medication List     STOP taking these medications    metoprolol tartrate 25 MG tablet Commonly known as: LOPRESSOR   Multaq 400 MG tablet Generic drug: dronedarone   potassium chloride SA 20 MEQ tablet Commonly known as: KLOR-CON M       TAKE these medications    acetaminophen 500 MG tablet Commonly known as: TYLENOL Take 1,000 mg by mouth 2 (two) times daily as  needed (pain).   albuterol 108 (90 Base) MCG/ACT inhaler Commonly known as: VENTOLIN HFA Inhale 1 puff into the lungs 2 (two) times daily as needed for wheezing or shortness of breath.   ALPRAZolam 0.5 MG tablet Commonly known as: XANAX Take 1 tablet (0.5 mg total) by mouth at bedtime as needed for anxiety.   amiodarone 200 MG tablet Commonly known as: PACERONE Take 1 tablet (200 mg total) by mouth daily.   apixaban 5 MG Tabs tablet Commonly known as: ELIQUIS Take 1 tablet (5 mg total) by mouth 2 (two) times daily.   aspirin 81 MG chewable tablet Chew 81 mg by mouth daily.   Breztri Aerosphere 160-9-4.8 MCG/ACT Aero Generic drug: Budeson-Glycopyrrol-Formoterol Inhale 2 puffs into the lungs in the morning and at bedtime.   furosemide 20 MG tablet Commonly known as: LASIX Take 3 tablets (60 mg total) by mouth daily. Start taking on: March 26, 2023 What changed: how much to take   levalbuterol 1.25 MG/0.5ML nebulizer solution Commonly known as: XOPENEX Take 1.25 mg by nebulization every 4 (four) hours as needed for wheezing or shortness of breath.   pravastatin 20 MG tablet Commonly known as: PRAVACHOL Take 1 tablet (20 mg total) by mouth daily at 6 PM.   sertraline 100 MG tablet Commonly known as: ZOLOFT Take 100 mg by mouth.   spironolactone 25 MG tablet Commonly known as: ALDACTONE Take 0.5 tablets (12.5 mg total) by mouth daily.  Durable Medical Equipment  (From admission, onward)           Start     Ordered   03/25/23 0830  For home use only DME Bedside commode  Once       Question Answer Comment  Patient needs a bedside commode to treat with the following condition Heart failure [facility])   Patient needs a bedside commode to treat with the following condition Physical deconditioning      03/25/23 0831   03/18/23 1630  For home use only DME Nebulizer machine  Once       Question Answer Comment  Patient needs a nebulizer to treat with  the following condition COPD (chronic obstructive pulmonary disease) (HCC)   Length of Need Lifetime      03/18/23 1632   03/18/23 1629  For home use only DME Other see comment  Once       Comments: humidfication  Question:  Length of Need  Answer:  Lifetime   03/18/23 1632            Follow-up Information      [facility] Follow up on 04/02/2023.   Specialty: Cardiology Why: Follow up in the Advanced Heart Failure Clinic 04/02/23 at 1200 Entrance C, free valet Contact information: [address] [id number] mc [place] [address] [phone]        [facility] Follow up.   Specialty: Home Health Services Why: Home Health Physical Therapy, Occupational Therapy, and RN- agency will call to arrange visit. Contact information: [address] [place] [address] [phone]         Monica Apley, NP Follow up.   Specialty: Adult Health Nurse Practitioner Why: hospital follow up appt- 04/08/2023 130 pm, Dyke Brackett NP (your provider is on vacation) Contact information: [address] [place] [address] [phone]                Discharge Exam: Filed Weights   03/23/23 0428 03/24/23 0621 03/25/23 0608  Weight: 73.9 kg 73.4 kg 73.4 kg   BP (!) 137/59 (BP Location: Left Arm)   Pulse 65   Temp 97.9 F (36.6 C) (Oral)   Resp 20   Ht 5\' 6"  (1.676 m)   Wt 73.4 kg   SpO2 96%   BMI 26.12 kg/m   Patient is feeling well, no chest pain or dyspnea, no PND or orthopnea.   Neurology awake and alert ENT with mild pallor Cardiovascular with S1 and S2 present and rhythmic with no gallops or murmurs No JVD No lower extremity edema Respiratory with no rales or wheezing, noted prolonged expiratory phase.  Abdomen with no distention   Condition at discharge: stable  The results of significant diagnostics from this hospitalization (including imaging,  microbiology, ancillary and laboratory) are listed below for reference.   Imaging Studies: ECHO TEE  Result Date: 03/20/2023    TRANSESOPHOGEAL ECHO REPORT   Patient Name:   TOMISHA REPPUCCI Date of Exam: 03/20/2023 Medical Rec #:  [id number]           Height:       66.0 in Accession #:    [id number]          Weight:       165.3 lb Date of Birth:  Jul 12, 1951           BSA:          1.844 m  Patient Age:    72 years            BP:           108/70 mmHg Patient Gender: F                   HR:           69 bpm. Exam Location:  Inpatient Procedure: Transesophageal Echo, Color Doppler and Cardiac Doppler Indications:     I48.91* Unspecified atrial fibrillation  History:         Patient has prior history of Echocardiogram examinations, most                  recent 03/18/2023. CAD, Prior CABG, COPD, Arrythmias:Atrial                  Fibrillation; Risk Factors:Hypertension and Dyslipidemia.  Sonographer:     Irving Burton Senior RDCS Referring Phys:  [phone] Benay Spice [facility] Diagnosing Phys: Wilfred Lacy PROCEDURE: After discussion of the risks and benefits of a TEE, an informed consent was obtained from the patient. The transesophogeal probe was passed without difficulty through the esophogus of the patient. Sedation performed by different physician. The patient was monitored while under deep sedation. Anesthestetic sedation was provided intravenously by Anesthesiology: 108mg  of Propofol, 80mg  of Lidocaine. The patient developed no complications during the procedure. A successful direct current cardioversion was performed at 200 joules with 1 attempt.  IMPRESSIONS  1. Left ventricular ejection fraction, by estimation, is 30 to 35%. The left ventricle has moderately decreased function. The left ventricle demonstrates global hypokinesis. The left ventricular internal cavity size was mildly dilated.  2. Right ventricular systolic function is moderately reduced. The right ventricular size is mildly enlarged. There is mildly  elevated pulmonary artery systolic pressure. The estimated right ventricular systolic pressure is 42.0 mmHg.  3. No LA thrombus. No LA appendage was present (s/p surgical clipping). Left atrial size was mildly dilated.  4. No PFO or ASD by color doppler.  5. There was lipomatous atria septal hypertrophy.  6. Right atrial size was mildly dilated.  7. The tricuspid valve is abnormal. Tricuspid valve regurgitation is moderate to severe.  8. The mitral valve is normal in structure. Mild mitral valve regurgitation. No evidence of mitral stenosis.  9. The aortic valve is tricuspid. Aortic valve regurgitation is not visualized. No aortic stenosis is present. 10. The inferior vena cava is dilated in size with <50% respiratory variability, suggesting right atrial pressure of 15 mmHg. 11. Normal caliber thoracic aorta with grade 3 plaque in the descending thoracic aorta. FINDINGS  Left Ventricle: Left ventricular ejection fraction, by estimation, is 30 to 35%. The left ventricle has moderately decreased function. The left ventricle demonstrates global hypokinesis. The left ventricular internal cavity size was mildly dilated. There is no left ventricular hypertrophy. Right Ventricle: The right ventricular size is mildly enlarged. No increase in right ventricular wall thickness. Right ventricular systolic function is moderately reduced. There is mildly elevated pulmonary artery systolic pressure. The tricuspid regurgitant velocity is 2.60 m/s, and with an assumed right atrial pressure of 15 mmHg, the estimated right ventricular systolic pressure is 42.0 mmHg. Left Atrium: No LA thrombus. No LA appendage was present (s/p surgical clipping). Left atrial size was mildly dilated. No left atrial/left atrial appendage thrombus was detected. Right Atrium: Right atrial size was mildly dilated. Pericardium: There is no evidence of pericardial effusion. Mitral Valve: The mitral valve is normal in structure. Mild mitral  valve  regurgitation. No evidence of mitral valve stenosis. Tricuspid Valve: The tricuspid valve is abnormal. Tricuspid valve regurgitation is moderate to severe. Aortic Valve: The aortic valve is tricuspid. Aortic valve regurgitation is not visualized. No aortic stenosis is present. Pulmonic Valve: The pulmonic valve was normal in structure. Pulmonic valve regurgitation is not visualized. Aorta: The aortic root is normal in size and structure. Venous: The inferior vena cava is dilated in size with less than 50% respiratory variability, suggesting right atrial pressure of 15 mmHg. IAS/Shunts: No PFO or ASD by color doppler. Additional Comments: Spectral Doppler performed. TRICUSPID VALVE TR Peak grad:   27.0 mmHg TR Vmax:        260.00 cm/s Dalton McleanMD Electronically signed by Wilfred Lacy Signature Date/Time: 03/20/2023/6:24:37 PM    Final (Updated)    EP STUDY  Result Date: 03/20/2023 See surgical note for result.  VAS US RENAL ARTERY DUPLEX  Result Date: 03/19/2023 ABDOMINAL VISCERAL Patient Name:  YAILYN STRACK  Date of Exam:   03/19/2023 Medical Rec #: [id number]            Accession #:    [id number] Date of Birth: 1951/09/13            Patient Gender: F Patient Age:   51 years Exam Location:  [facility] Procedure:      VAS US RENAL ARTERY DUPLEX Referring Phys: [id number] LINDSAY NICOLE [facility] -------------------------------------------------------------------------------- Indications: Stenosis, renal I70.1 High Risk Factors: Hypertension, hyperlipidemia. Limitations: Air/bowel gas, obesity, patient discomfort and respiratory disturbance. Comparison Study: No prior studies. Performing Technologist: Chanda Busing RVT  Examination Guidelines: A complete evaluation includes B-mode imaging, spectral Doppler, color Doppler, and power Doppler as needed of all accessible portions of each vessel. Bilateral testing is considered an integral part of a complete examination. Limited examinations for reoccurring  indications may be performed as noted.  Duplex Findings: +--------------------+--------+--------+------+------------------+ Mesenteric          PSV cm/sEDV cm/sPlaque     Comments      +--------------------+--------+--------+------+------------------+ Aorta Mid             348     100                            +--------------------+--------+--------+------+------------------+ Celiac Artery Origin                      Unable to insonate +--------------------+--------+--------+------+------------------+ SMA Origin                                Unable to insonate +--------------------+--------+--------+------+------------------+    +------------------+--------+--------+-------+ Right Renal ArteryPSV cm/sEDV cm/sComment +------------------+--------+--------+-------+ Origin               74      13           +------------------+--------+--------+-------+ Proximal            101      18           +------------------+--------+--------+-------+ Mid                 125      19           +------------------+--------+--------+-------+ Distal              143      29           +------------------+--------+--------+-------+ +-----------------+--------+--------+-------+  Left Renal ArteryPSV cm/sEDV cm/sComment +-----------------+--------+--------+-------+ Origin              45      13           +-----------------+--------+--------+-------+ Proximal            74      20           +-----------------+--------+--------+-------+ Mid                 52      18           +-----------------+--------+--------+-------+ Distal              50      11           +-----------------+--------+--------+-------+ +------------+--------+--------+----+-----------+--------+--------+----+ Right KidneyPSV cm/sEDV cm/sRI  Left KidneyPSV cm/sEDV cm/sRI   +------------+--------+--------+----+-----------+--------+--------+----+ Upper Pole  34      10      0.71Upper  Pole 42      8       0.82 +------------+--------+--------+----+-----------+--------+--------+----+ Mid         43      9       0.        51      15      0.71 +------------+--------+--------+----+-----------+--------+--------+----+ Lower Pole  65      21      0.67Lower Pole 50      9       0.82 +------------+--------+--------+----+-----------+--------+--------+----+ Hilar       88      17      0.81Hilar      55      17      0.68 +------------+--------+--------+----+-----------+--------+--------+----+ +------------------+----+------------------+----+ Right Kidney          Left Kidney            +------------------+----+------------------+----+ RAR                   RAR                    +------------------+----+------------------+----+ RAR (manual)      0.41RAR (manual)      0.21 +------------------+----+------------------+----+ Cortex                Cortex                 +------------------+----+------------------+----+ Cortex thickness      Corex thickness        +------------------+----+------------------+----+ Kidney length (cm)8.00Kidney length (cm)8.00 +------------------+----+------------------+----+  Summary: Renal:  Right: Abnormal size for the right kidney. Normal right Resisitive        Index. 1-59% stenosis of the right renal artery. Left:  Abnormal size for the left kidney. Normal left Resistive        Index. 1-59% stenosis of the left renal artery.  *See table(s) above for measurements and observations.  Diagnosing physician: Lemar Livings MD  Electronically signed by Lemar Livings MD on 03/19/2023 at 12:11:25 PM.    Final    ECHOCARDIOGRAM LIMITED  Result Date: 03/18/2023    ECHOCARDIOGRAM LIMITED REPORT   Patient Name:   LOUNELL SCHUMACHER Date of Exam: 03/18/2023 Medical Rec #:  [id number]           Height:       66.0 in Accession #:    [id number]          Weight:       175.6 lb Date of Birth:  1950-11-04           BSA:          1.892 m  Patient Age:    72 years            BP:           121/85 mmHg Patient Gender: F                   HR:           93 bpm. Exam Location:  Inpatient Procedure: Limited Echo, Cardiac Doppler and Limited Color Doppler Indications:     Acute respiratory distress  History:         Patient has prior history of Echocardiogram examinations, most                  recent 03/04/2023. HFmrEF, CAD, Prior CABG, COPD and Carotid                  Disease, Arrythmias:Atrial Fibrillation; Risk                  Factors:Hypertension and Former Smoker.  Sonographer:     Milda Smart Referring Phys:  [id number] Dorthula Nettles Diagnosing Phys: Epifanio Lesches MD IMPRESSIONS  1. Left ventricular ejection fraction, by estimation, is 35 to 40%. The left ventricle has moderately decreased function. The left ventricle demonstrates global hypokinesis. There is mild left ventricular hypertrophy.  2. Right ventricular systolic function is moderately reduced. The right ventricular size is normal. There is moderately elevated pulmonary artery systolic pressure. The estimated right ventricular systolic pressure is 52.2 mmHg.  3. The mitral valve is normal in structure. Mild mitral valve regurgitation.  4. Tricuspid valve regurgitation is moderate.  5. The aortic valve is tricuspid. Aortic valve regurgitation is not visualized. Aortic valve sclerosis is present, with no evidence of aortic valve stenosis.  6. The inferior vena cava is dilated in size with <50% respiratory variability, suggesting right atrial pressure of 15 mmHg. FINDINGS  Left Ventricle: Left ventricular ejection fraction, by estimation, is 35 to 40%. The left ventricle has moderately decreased function. The left ventricle demonstrates global hypokinesis. The left ventricular internal cavity size was normal in size. There is mild left ventricular hypertrophy. Right Ventricle: The right ventricular size is normal. Right ventricular systolic function is moderately reduced. There is  moderately elevated pulmonary artery systolic pressure. The tricuspid regurgitant velocity is 3.05 m/s, and with an assumed right atrial pressure of 15 mmHg, the estimated right ventricular systolic pressure is 52.2 mmHg. Pericardium: There is no evidence of pericardial effusion. Mitral Valve: The mitral valve is normal in structure. Mild mitral valve regurgitation. Tricuspid Valve: The tricuspid valve is normal in structure. Tricuspid valve regurgitation is moderate. Aortic Valve: The aortic valve is tricuspid. Aortic valve regurgitation is not visualized. Aortic valve sclerosis is present, with no evidence of aortic valve stenosis. Pulmonic Valve: The pulmonic valve was not well visualized. Pulmonic valve regurgitation is trivial. Venous: The inferior vena cava is dilated in size with less than 50% respiratory variability, suggesting right atrial pressure of 15 mmHg. IAS/Shunts: No atrial level shunt detected by color flow Doppler. Additional Comments: Spectral Doppler performed. Color Doppler performed.  LEFT VENTRICLE PLAX 2D LVIDd:         5.10 cm LVIDs:         4.20 cm LV PW:         1.20 cm LV IVS:  1.20 cm  RIGHT VENTRICLE RV Basal diam:  3.30 cm RV Mid diam:    2.60 cm TAPSE (M-mode): 0.6 cm LEFT ATRIUM         Index LA diam:    4.00 cm 2.11 cm/m  TRICUSPID VALVE TR Peak grad:   37.2 mmHg TR Vmax:        305.00 cm/s Epifanio Lesches MD Electronically signed by Epifanio Lesches MD Signature Date/Time: 03/18/2023/11:52:55 AM    Final (Updated)    DG CHEST PORT 1 VIEW  Result Date: 03/17/2023 CLINICAL DATA:  PICC line placement EXAM: PORTABLE CHEST 1 VIEW COMPARISON:  03/13/2023 FINDINGS: Mild bibasilar opacities, likely atelectasis. No pleural effusion or pneumothorax. Cardiomegaly. Postsurgical changes related to prior CABG. Thoracic aortic atherosclerosis. Right arm PICC terminating at the cavoatrial junction. Median sternotomy. IMPRESSION: Right arm PICC terminating at the cavoatrial  junction. Electronically Signed   By: Charline Bills M.D.   On: 03/17/2023 19:04   [place] EKG SITE RITE  Result Date: 03/17/2023 If Site Rite image not attached, placement could not be confirmed due to current cardiac rhythm.  CARDIAC CATHETERIZATION  Result Date: 03/17/2023   Prox RCA lesion is 100% stenosed.  Dist Cx lesion is 100% stenosed. -, RPDA lesion is 55% stenosed.   Prox LAD lesion is 60% stenosed.  Mid LAD lesion is 70% stenosed.   LIMA graft was visualized by angiography.   SVG-OM3 graft was visualized by angiography and is normal in caliber.  The graft exhibits no disease.   SVG-rPDA  graft was visualized by angiography and is large.  The graft exhibits no disease.   Hemodynamic findings consistent with moderate pulmonary hypertension with Severe RV Failure.   There is no aortic valve stenosis. POST-CATH FINDINGS Severe combined CHF with severely elevated right-sided pressures: RAP mean 24 mmHg with large mild wave.; RV P-EDP 57/8-17 mmHg. PAP-mean 6/37-42 mmHg; PCWP 35 mmHg with V wave up to 43. Significant respiratory variation. LV P-EDP 118/16-22 mmHg, AO P-MAP 106/73-79 mmHg Ao sat 96, PA sat 54%. CO-CI (Fick) 4.51-2.35; PAPI 0.86 Significant native vessel CAD with 3 of 3 patent grafts: 100% proximal RCA CTO; 100% mLCx; Prox LAD 60% & mLAD ~70% (competetive flow from LIMA-LAD) Patent LIMA-LAD, SVG-PDA, SVG-OM 3 RECOMMENDATIONS: Return patient to nursing unit for ongoing care with IV diuresis => Heart Failure Service Consulted based on Severe RV Failure She was given 80 mg IV Lasix in the Cath Lab With the potential for having other invasive procedures including PICC line, I have written to restart IV heparin 2 hours after sheath removal.  Would convert back to DOAC once invasive procedures are completed. Of note, she was in A-fib RVR with rates in the 100 120 bpm range. Bryan Lemma, MD  VAS [place] ABI WITH/WO TBI  Result Date: 03/16/2023  LOWER EXTREMITY DOPPLER STUDY Patient Name:  HAREEM SUROWIEC  Date of Exam:   03/16/2023 Medical Rec #: [id number]            Accession #:    [id number] Date of Birth: Dec 15, 1950            Patient Gender: F Patient Age:   52 years Exam Location:  [facility] Procedure:      VAS [place] ABI WITH/WO TBI Referring Phys: Franklin O'NEAL --------------------------------------------------------------------------------  Indications: Peripheral artery disease. Patient states weakness in her legs              without pain. High Risk Factors: Hypertension, hyperlipidemia, coronary artery disease.  Other Factors: Status post CABG, COPD,carotid disease.  Performing Technologist: Marilynne Halsted RDMS, RVT  Examination Guidelines: A complete evaluation includes at minimum, Doppler waveform signals and systolic blood pressure reading at the level of bilateral brachial, anterior tibial, and posterior tibial arteries, when vessel segments are accessible. Bilateral testing is considered an integral part of a complete examination. Photoelectric Plethysmograph (PPG) waveforms and toe systolic pressure readings are included as required and additional duplex testing as needed. Limited examinations for reoccurring indications may be performed as noted.  ABI Findings: +---------+------------------+-----+----------+--------+ Right    Rt Pressure (mmHg)IndexWaveform  Comment  +---------+------------------+-----+----------+--------+ Brachial 105                    triphasic          +---------+------------------+-----+----------+--------+ PTA      69                0.66 monophasic         +---------+------------------+-----+----------+--------+ DP       66                0.63 monophasic         +---------+------------------+-----+----------+--------+ Great Toe55                0.52 Abnormal           +---------+------------------+-----+----------+--------+ +---------+------------------+-----+----------+----------------+ Left     Lt Pressure (mmHg)IndexWaveform   Comment          +---------+------------------+-----+----------+----------------+ Brachial                        triphasic NA -IV upper arm +---------+------------------+-----+----------+----------------+ PTA      68                0.65 monophasic                 +---------+------------------+-----+----------+----------------+ DP       70                0.67 monophasic                 +---------+------------------+-----+----------+----------------+ Great Toe55                0.52 Abnormal                   +---------+------------------+-----+----------+----------------+ +-------+-----------+-----------+------------+------------+ ABI/TBIToday's ABIToday's TBIPrevious ABIPrevious TBI +-------+-----------+-----------+------------+------------+ Right  0.66       0.52                                +-------+-----------+-----------+------------+------------+ Left   0.67       0.52                                +-------+-----------+-----------+------------+------------+ No priors.  Summary: Right: Resting right ankle-brachial index indicates moderate right lower extremity arterial disease. The right toe-brachial index is abnormal. Left: Resting left ankle-brachial index indicates moderate left lower extremity arterial disease. The left toe-brachial index is abnormal. *See table(s) above for measurements and observations.  Electronically signed by Gerarda Fraction on 03/16/2023 at 3:12:16 PM.    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 SMA moderate              arteriosclerosis at the mid and distal segments. High grade              stenosis at the origin of IMA. High Risk Factors: Hypertension, hyperlipidemia, past history of smoking. Other Factors: S/P CABG. Limitations: Air/bowel gas and obesity. Performing Technologist: Marilynne Halsted RDMS, RVT  Examination Guidelines: A complete evaluation includes B-mode imaging, spectral Doppler, color Doppler, and power Doppler as needed of all accessible portions of each vessel. Bilateral testing is considered an integral part of a complete examination. Limited examinations for reoccurring indications may be performed as noted.  Duplex Findings: +----------------------+--------+--------+------+-------------------+ Mesenteric            PSV cm/sEDV cm/sPlaque     Comments       +----------------------+--------+--------+------+-------------------+ Aorta Mid                68                                     +----------------------+--------+--------+------+-------------------+ Celiac Artery Origin                         appears occluded   +----------------------+--------+--------+------+-------------------+ Celiac Artery Proximal  145                      abn flow       +----------------------+--------+--------+------+-------------------+ SMA Origin              219                                     +----------------------+--------+--------+------+-------------------+ SMA Proximal            343                                     +----------------------+--------+--------+------+-------------------+ SMA Mid                 310                                     +----------------------+--------+--------+------+-------------------+ SMA Distal              251                                      +----------------------+--------+--------+------+-------------------+ CHA                      40                                     +----------------------+--------+--------+------+-------------------+  Splenic                                     not well visualized +----------------------+--------+--------+------+-------------------+ IMA                     518                                     +----------------------+--------+--------+------+-------------------+    Summary: Mesenteric: 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 High grade stenosis at the origin of the IMA.  *See table(s) above for measurements and observations.  Diagnosing physician: Gerarda Fraction  Electronically signed by Gerarda Fraction on 03/16/2023 at 3:11:55 PM.    Final    CT Angio Chest/Abd/Pel for Dissection W and/or Wo Contrast  Result Date: 03/13/2023 CLINICAL DATA:  Acute aortic syndrome, chest pain, epigastric abdominal pain EXAM: CT ANGIOGRAPHY CHEST, ABDOMEN AND PELVIS TECHNIQUE: Non-contrast CT of the chest was initially obtained. Multidetector CT imaging through the chest, abdomen and pelvis was performed using the standard protocol during bolus administration of intravenous contrast. Multiplanar reconstructed images and MIPs were obtained and reviewed to evaluate the vascular anatomy. RADIATION DOSE REDUCTION: This exam was performed according to the departmental dose-optimization program which includes automated exposure control, adjustment of the mA and/or kV according to patient size and/or use of iterative reconstruction technique. CONTRAST:  80mL OMNIPAQUE IOHEXOL 350 MG/ML SOLN COMPARISON:  None Available. FINDINGS: CTA CHEST FINDINGS Cardiovascular: Extensive multi-vessel coronary artery calcification. Coronary artery bypass grafting has been performed. Global cardiac size within normal limits. No pericardial effusion. Central pulmonary arteries are  enlarged in keeping with changes of pulmonary arterial hypertension. There is adequate opacification of the pulmonary arterial tree and no intraluminal filling defect is identified through the segmental level to suggest acute pulmonary embolism. The thoracic aorta is normal in caliber; no aneurysm, intramural hematoma or dissection is identified. Moderate mixed atherosclerotic plaque is seen within the thoracic aorta. Mediastinum/Nodes: No enlarged mediastinal, hilar, or axillary lymph nodes. Thyroid gland, trachea, and esophagus demonstrate no significant findings. Lungs/Pleura: There is mosaic attenuation within the pulmonary parenchyma in keeping with multifocal air trapping related to this probable small airways disease. There is bronchial wall thickening present in keeping with airway inflammation. No confluent pulmonary infiltrate. No pneumothorax or pleural effusion. No central obstructing lesion. Musculoskeletal: Remote left fifth rib fracture noted anteriorly. No acute bone abnormality. No lytic or blastic bone lesion. Review of the MIP images confirms the above findings. CTA ABDOMEN AND PELVIS FINDINGS VASCULAR Aorta: Normal caliber. No aneurysm or dissection. Extensive atherosclerotic calcification with mild mural thrombus noted within the distal infrarenal abdominal aorta. No evidence of hemodynamically significant stenosis Celiac: Occluded at the origin with reconstitution via the gastroduodenal artery. Distal branches appear diminutive in caliber. Variant anatomy with early takeoff of the right hepatic artery. SMA: Moderate arteriosclerosis involving the mid and distal segments. No evidence of hemodynamically significant stenosis. No aneurysm or dissection. Renals: Single renal arteries are seen bilaterally demonstrating extensive arteriosclerosis. The renal arteries appear diminutive in caliber. Near occlusion noted involving the proximal right renal artery secondary to atherosclerotic calcification.  The left renal artery appears diffusely diminutive in caliber without focal stenosis identified. Normal vascular morphology. IMA: High-grade stenosis at the origin. Distally diminutive but patent. Inflow: Extensive  atherosclerotic calcification results in hemodynamically significant stenoses of the common iliac artery origins bilaterally. The lower extremity arterial inflow vasculature is diffusely diseased and diminutive in caliber. Internal iliac arteries are heavily diseased and appear occluded at the origin on the left with relatively poor distal reconstitution and diminutive in caliber on the right. Veins: There is retrograde opacification of the hepatic venous system with mild dilation of the hepatocaval junction in keeping with changes of right heart failure. Review of the MIP images confirms the above findings. NON-VASCULAR Hepatobiliary: Subtly nodular contour of the liver suggests changes of cirrhosis. No enhancing intrahepatic mass. Cholecystectomy has been performed. No intra or extrahepatic biliary ductal dilation. Pancreas: Unremarkable Spleen: Unremarkable Adrenals/Urinary Tract: The adrenal glands are unremarkable. The kidneys are normal in position. Bilateral renal cortical scarring noted. No hydronephrosis. No intrarenal urinary or ureteral calculi identified. The bladder is decompressed and is unremarkable. Stomach/Bowel: Stomach, small bowel, and large bowel are unremarkable. Appendix absent. No free intraperitoneal gas. Mild ascites. Lymphatic: No pathologic adenopathy within the abdomen and pelvis. Reproductive: Status post hysterectomy. No adnexal masses. Other: Tiny fat containing bilateral inguinal hernia. Musculoskeletal: Degenerative changes are seen within the lumbar spine. No acute bone abnormality. No lytic or blastic bone lesion. Review of the MIP images confirms the above findings. IMPRESSION: 1. No evidence of thoracoabdominal aortic aneurysm or dissection. 2. No pulmonary embolism  identified. 3. Extensive multi-vessel coronary artery calcification, status post coronary artery bypass grafting. 4. Morphologic changes in keeping with pulmonary arterial hypertension and right heart failure with retrograde opacification of the hepatic venous system. 5. Mosaic attenuation within the pulmonary parenchyma and bronchial wall thickening in keeping with airway inflammation and multifocal air trapping. 6. 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 Clinical correlation for signs and symptoms of chronic mesenteric ischemia may be helpful. 7. Diffusely diminutive renal arteries bilaterally. Superimposed focal hemodynamically significant stenosis of the right renal artery origin. 8. Peripheral vascular disease with hemodynamically significant stenoses of the common iliac artery origins bilaterally and diffusely diseased lower extremity arterial inflow vasculature. 9. Morphologic changes suggesting cirrhosis. This could be confirmed with hepatic elastography or tissue sampling. Mild ascites. Aortic Atherosclerosis (ICD10-I70.0). Electronically Signed   By: Helyn Numbers M.D.   On: 03/13/2023 03:37   DG Chest Portable 1 View  Result Date: 03/13/2023 CLINICAL DATA:  Chest pain. EXAM: PORTABLE CHEST 1 VIEW COMPARISON:  July 19, 2021 FINDINGS: Multiple sternal wires and vascular clips are noted. The cardiac silhouette is mildly enlarged. There is moderate severity calcification of the thoracic aorta. Low lung volumes are seen. Mild, diffuse, chronic appearing increased interstitial lung markings are seen. There is no evidence of acute infiltrate, pleural effusion or pneumothorax. The visualized skeletal structures are unremarkable. IMPRESSION: 1. Evidence of prior median sternotomy/CABG. 2. Low lung volumes without acute or active cardiopulmonary disease. Electronically Signed   By: Aram Candela M.D.   On: 03/13/2023 01:37   VAS US  CAROTID  Result Date: 03/05/2023 Carotid Arterial Duplex Study Patient Name:  TAJHA SAMMARCO  Date of Exam:   03/04/2023 Medical Rec #: [id number]            Accession #:    [id number] Date of Birth: 1950/12/15            Patient Gender: F Patient Age:   58 years Exam Location:  [facility] Procedure:      VAS US CAROTID Referring Phys: Arlys John CRENSHAW --------------------------------------------------------------------------------  Indications:   Carotid  artery disease. Other Factors: Patients states she has a hx of carotid artery disease. No                previous studies found. Limitations    Today's exam was limited due to the body habitus of the patient                and the patient's respiratory variation. Performing Technologist: Jake Seats RDMS, RVT, RDCS  Examination Guidelines: A complete evaluation includes B-mode imaging, spectral Doppler, color Doppler, and power Doppler as needed of all accessible portions of each vessel. Bilateral testing is considered an integral part of a complete examination. Limited examinations for reoccurring indications may be performed as noted.  Right Carotid Findings: +----------+--------+--------+--------+------------------+--------------+           PSV cm/sEDV cm/sStenosisPlaque DescriptionComments       +----------+--------+--------+--------+------------------+--------------+ CCA Prox  132     21                                               +----------+--------+--------+--------+------------------+--------------+ CCA Distal90      12                                               +----------+--------+--------+--------+------------------+--------------+ ICA Prox  309     104     60-79%  heterogenous                     +----------+--------+--------+--------+------------------+--------------+ ICA Mid   73      16                                               +----------+--------+--------+--------+------------------+--------------+  ICA Distal                                          Not visualized +----------+--------+--------+--------+------------------+--------------+ ECA       81      0                                                +----------+--------+--------+--------+------------------+--------------+ +----------+--------+-------+----------------+-------------------+           PSV cm/sEDV cmsDescribe        Arm Pressure (mmHG) +----------+--------+-------+----------------+-------------------+ [id number]      0      Multiphasic, [id number]                 +----------+--------+-------+----------------+-------------------+ +---------+--------+--+--------+--+---------+ VertebralPSV cm/s49EDV cm/s11Antegrade +---------+--------+--+--------+--+---------+ Technically challenging study due to motion and visibility of vessels. Left Carotid Findings: +----------+--------+--------+--------+------------------+--------+           PSV cm/sEDV cm/sStenosisPlaque DescriptionComments +----------+--------+--------+--------+------------------+--------+ CCA Prox  101     14                                         +----------+--------+--------+--------+------------------+--------+  CCA Distal57      12                                         +----------+--------+--------+--------+------------------+--------+ ICA Prox  143     27              heterogenous               +----------+--------+--------+--------+------------------+--------+ ICA Mid   175     55      40-59%                             +----------+--------+--------+--------+------------------+--------+ ICA Distal127     28                                         +----------+--------+--------+--------+------------------+--------+ ECA       180     10      >50%                               +----------+--------+--------+--------+------------------+--------+  +----------+--------+--------+----------------+-------------------+           PSV cm/sEDV cm/sDescribe        Arm Pressure (mmHG) +----------+--------+--------+----------------+-------------------+ [id number]     0       Multiphasic, [id number]                 +----------+--------+--------+----------------+-------------------+ +---------+--------+--+--------+--+---------+ VertebralPSV cm/s65EDV cm/s14Antegrade +---------+--------+--+--------+--+---------+   Summary: Right Carotid: Velocities in the right ICA are consistent with a 60-79%                stenosis. Left Carotid: Velocities in the left ICA are consistent with a 40-59% stenosis.               The ECA appears >50% stenosed. Vertebrals:  Bilateral vertebral arteries demonstrate antegrade flow. Subclavians: Normal flow hemodynamics were seen in bilateral subclavian              arteries. *See table(s) above for measurements and observations. Suggest follow up study in 12 months. Electronically signed by Nanetta Batty MD on 03/05/2023 at 5:56:23 AM.    Final    ECHOCARDIOGRAM COMPLETE  Result Date: 03/04/2023    ECHOCARDIOGRAM REPORT   Patient Name:   LASHAI GROSCH Date of Exam: 03/04/2023 Medical Rec #:  [id number]           Height:       66.0 in Accession #:    [id number]          Weight:       179.4 lb Date of Birth:  April 22, 1951           BSA:          1.910 m Patient Age:    72 years            BP:           120/80 mmHg Patient Gender: F                   HR:           78 bpm. Exam Location:  [address] Procedure: 2D Echo, 3D Echo, Cardiac Doppler and Color Doppler Indications:  R06.00 Dyspnea  History:        Patient has prior history of Echocardiogram examinations, most                 recent 08/11/2021. CHF, CAD, Prior CABG, COPD, Arrythmias:Atrial                 Fibrillation, Signs/Symptoms:Dyspnea and Edema; Risk                 Factors:Hypertension and Former Smoker.  Sonographer:    Farrel Conners RDCS Referring  Phys: [id number] BRIAN S CRENSHAW IMPRESSIONS  1. Left ventricular ejection fraction, by estimation, is 40 to 45%. The left ventricle has mildly decreased function. The left ventricle demonstrates global hypokinesis. Left ventricular diastolic parameters are indeterminate.  2. Right ventricular systolic function is mildly reduced. The right ventricular size is mildly enlarged. There is severely elevated pulmonary artery systolic pressure. The estimated right ventricular systolic pressure is 76.6 mmHg.  3. Left atrial size was mildly dilated.  4. Right atrial size was moderately dilated.  5. The mitral valve is grossly normal. Moderate mitral valve regurgitation.  6. Tricuspid valve regurgitation is moderate.  7. The aortic valve is tricuspid. There is mild calcification of the aortic valve. There is mild thickening of the aortic valve. Aortic valve regurgitation is not visualized. Aortic valve sclerosis/calcification is present, without any evidence of aortic stenosis.  8. Aortic dilatation noted. There is borderline dilatation of the ascending aorta, measuring 37 mm.  9. The inferior vena cava is dilated in size with >50% respiratory variability, suggesting right atrial pressure of 8 mmHg. Comparison(s): Compared to prior TTE in 07/2021, the EF has dropped from 50-55% to 40%. There is now severe pulmonary HTN (previously mild) and moderate MR/TR which were previously mild. FINDINGS  Left Ventricle: Left ventricular ejection fraction, by estimation, is 40 to 45%. The left ventricle has mildly decreased function. The left ventricle demonstrates global hypokinesis. 3D ejection fraction reviewed and evaluated as part of the interpretation. Alternate measurement of EF is felt to be most reflective of LV function. The left ventricular internal cavity size was normal in size. There is no left ventricular hypertrophy. Left ventricular diastolic parameters are indeterminate. Right Ventricle: The right ventricular size is mildly  enlarged. No increase in right ventricular wall thickness. Right ventricular systolic function is mildly reduced. There is severely elevated pulmonary artery systolic pressure. The tricuspid regurgitant velocity is 4.14 m/s, and with an assumed right atrial pressure of 8 mmHg, the estimated right ventricular systolic pressure is 76.6 mmHg. Left Atrium: Left atrial size was mildly dilated. Right Atrium: Right atrial size was moderately dilated. Pericardium: There is no evidence of pericardial effusion. Mitral Valve: The mitral valve is grossly normal. There is mild thickening of the mitral valve leaflet(s). Moderate mitral valve regurgitation. Tricuspid Valve: The tricuspid valve is normal in structure. Tricuspid valve regurgitation is moderate. Aortic Valve: The aortic valve is tricuspid. There is mild calcification of the aortic valve. There is mild thickening of the aortic valve. Aortic valve regurgitation is not visualized. Aortic valve sclerosis/calcification is present, without any evidence of aortic stenosis. Pulmonic Valve: The pulmonic valve was normal in structure. Pulmonic valve regurgitation is mild. Aorta: Aortic dilatation noted. There is borderline dilatation of the ascending aorta, measuring 37 mm. Venous: The inferior vena cava is dilated in size with greater than 50% respiratory variability, suggesting right atrial pressure of 8 mmHg. IAS/Shunts: The atrial septum is grossly normal.  LEFT VENTRICLE PLAX 2D LVIDd:  5.10 cm   Diastology LVIDs:         4.45 cm   LV e' medial:    9.46 cm/s LV PW:         0.90 cm   LV E/e' medial:  13.0 LV IVS:        1.00 cm   LV e' lateral:   10.40 cm/s LVOT diam:     2.30 cm   LV E/e' lateral: 11.8 LV SV:         46 LV SV Index:   24 LVOT Area:     4.15 cm                           3D Volume EF:                          3D EF:        41 %                          LV EDV:       168 ml                          LV ESV:       99 ml                          LV SV:         69 ml RIGHT VENTRICLE RV Basal diam:  5.00 cm RV Mid diam:    3.40 cm RV S prime:     10.80 cm/s TAPSE (M-mode): 0.8 cm RVSP:           71.6 mmHg LEFT ATRIUM             Index        RIGHT ATRIUM           Index LA diam:        4.60 cm 2.41 cm/m   RA Pressure: 3.00 mmHg LA Vol (A2C):   61.5 ml 32.21 ml/m  RA Area:     20.00 cm LA Vol (A4C):   61.7 ml 32.31 ml/m  RA Volume:   62.20 ml  32.57 ml/m LA Biplane Vol: 63.5 ml 33.25 ml/m  AORTIC VALVE LVOT Vmax:   67.60 cm/s LVOT Vmean:  44.350 cm/s LVOT VTI:    0.110 m  AORTA Ao Root diam: 3.00 cm Ao Asc diam:  3.70 cm MITRAL VALVE                TRICUSPID VALVE MV Area (PHT): cm          TR Peak grad:   68.6 mmHg MV Decel Time: 129 msec     TR Vmax:        414.00 cm/s MR Peak grad: 119.7 mmHg    Estimated RAP:  3.00 mmHg MR Mean grad: 80.0 mmHg     RVSP:           71.6 mmHg MR Vmax:      547.00 cm/s MR Vmean:     418.0 cm/s    SHUNTS MV E velocity: 123.00 cm/s  Systemic VTI:  0.11 m MV A velocity: 38.90 cm/s   Systemic Diam: 2.30 cm MV E/A ratio:  3.16 Laurance Flatten MD Electronically signed by Laurance Flatten  MD Signature Date/Time: 03/04/2023/12:23:30 PM    Final     Microbiology: Results for orders placed or performed during the hospital encounter of 03/13/23  Stool culture     Status: None   Collection Time: 03/18/23  5:59 PM   Specimen: Stool  Result Value Ref Range Status   Salmonella/Shigella Screen Final report  Final   Campylobacter Culture Final report  Final   E coli, Shiga toxin Assay Negative Negative Final    Comment: (NOTE) Performed At: [facility] [address] [place], [place] [id number] Jolene Schimke MD HK:[id number]   STOOL CULTURE REFLEX - RSASHR     Status: None   Collection Time: 03/18/23  5:59 PM  Result Value Ref Range Status   Stool Culture result 1 (RSASHR) Comment  Final    Comment: (NOTE) No Salmonella or Shigella recovered. Performed At: [facility] [address] [place], [place]  [id number] Jolene Schimke MD OA:[id number]   STOOL CULTURE Reflex - CMPCXR     Status: None   Collection Time: 03/18/23  5:59 PM  Result Value Ref Range Status   Stool Culture result 1 (CMPCXR) Comment  Final    Comment: (NOTE) No Campylobacter species isolated. Performed At: [facility] [address] [place], [place] [id number] Jolene Schimke MD DD:[id number]     Labs: CBC: Recent Labs  Lab 03/19/23 0432 03/20/23 0519 03/21/23 0524 03/22/23 0526  WBC 4.9 4.2 4.1 4.4  HGB 10.7* 9.9* 9.6* 10.1*  HCT 34.9* 31.9* 31.4* 32.2*  MCV 107.4* 106.7* 107.5* 107.3*  PLT 198 169 160 175   Basic Metabolic Panel: Recent Labs  Lab 03/19/23 0432 03/20/23 0519 03/21/23 0524 03/22/23 0526 03/23/23 0500 03/24/23 0530 03/25/23 0424  NA 140   < > 135 135 135 136 135  K 4.6   < > 3.7 3.2* 3.0* 3.5 3.5  CL 98   < > 92* 90* 90* 97* 101  CO2 30   < > 30 33* 33* 31 27  GLUCOSE 108*   < > 107* 97 103* 100* 99  BUN 21   < > 36* 47* 46* 37* 36*  CREATININE 1.57*   < > 1.95* 2.36* 2.01* 1.55* 1.73*  CALCIUM 9.4   < > 8.7* 9.1 8.5* 8.9 8.7*  MG 1.8  --   --   --   --   --   --   PHOS  --   --  5.7* 6.2* 4.9* 3.6  --    < > = values in this interval not displayed.   Liver Function Tests: Recent Labs  Lab 03/21/23 0524 03/22/23 0526 03/23/23 0500 03/24/23 0530  ALBUMIN 3.3* 3.4* 3.3* 3.2*   CBG: No results for input(s): "GLUCAP" in the last 168 hours.  Discharge time spent: greater than 30 minutes.  Signed: Coralie Keens, MD [facility] 03/25/2023

## 2023-03-25 NOTE — Assessment & Plan Note (Signed)
Chronic hypoxemic respiratory failure, no signs of acute exacerbation.  Continue supplemental 02 at home per [place] and as needed bronchodilator therapy.

## 2023-03-25 NOTE — Hospital Course (Signed)
Monica Zamora was admitted to the hospital with the working diagnosis of acute diastolic heart decompensation.   72 yo female with the past medical history of paroxysmal atrial fibrillation, hypertension, coronary artery disease, heart failure and chronic hypoxemic respiratory failure who presented with chest pain. Reported sudden onset of precordial chest pain radiated to the right neck and right shoulder, associated with exertional dyspnea. EMS was called and she was transported to the ED. On her initial physical examination she was hypotensive with blood pressure systolic in th 70's, and bradycardic with HR 30 to 40, blood pressure 107/785, 02 saturation 93%, lungs with rales at bases, heart with S1 and S2 present irregularly irregular, positive JVD, abdomen with no distention and positive lower extremity edema.   Na 140, K 3,9 Cl 106 bicarbonate 19, glucose 113, bun 21 cr 1.65,  AST 121, ALT 42  BNP 1,662 High sensitive troponin 39 and 40  Wbc 6,6 hgb 11.4 plt 255   Chest radiograph with cardiomegaly and bilateral hilar vascular congestion, no infiltrates and no effusions. Sternotomy wires in place.   CT chest with no infiltrates or effusions, no pulmonary embolism.  No evidence of thoracoabdominal aortic aneurysm or dissection. Extensive multivessel coronary artery calcification sp CABG. Occlusion of the celiac origin and high grade stenosis of the inferior mesenteric artery region.  Significant right renal artery stenosis.  Significant stenosis common iliac artery origins bilaterally.  Morphologic changes suggesting cirrhosis.   EKG 96 bpm, normal axis, normal qtc, atrial fibrillation rhythm with no significant ST segment changes, negative T wave lead I and AvL (new compared to 01/2023).   Patient was diuresed with good response.  Had further work up with right and left heart catheterization.

## 2023-03-25 NOTE — Progress Notes (Signed)
PICC line removed from RUE.  No signs of infection.  Cleaned with CHG, covered with vaseline guaze and dry 2x2 with occlusive dressing.  Pt verbalizes site care via teachback method- signs of infection, interventions for bleeding and infection and when to change dressing.  Also aware to stay in bed x 30 minutes.  RN aware of PICC removal.

## 2023-03-25 NOTE — Assessment & Plan Note (Signed)
Continue statin and close blood pressure monitoring.

## 2023-03-25 NOTE — Assessment & Plan Note (Addendum)
Patient was placed on IV amiodarone for rate control. She underwent TEE/ direct current cardioversion, on 07/05, converting to sinus rhythm. Patient will continue amiodarone 200 mg daily and anticoagulation with apixaban.  Discontinue B blocker to prevent bradycardia.

## 2023-03-25 NOTE — Progress Notes (Signed)
Explained discharge instructions to patient. Reviewed follow up appointment and next medication administration times. Also reviewed education. Patient verbalized having an understanding for instructions given. All belongings are in the patient's possession to include TOC meds and her bedside commode. Telemetry was removed. CCMD was notified. No other needs verbalized. Awaiting her ride to discharge home. She is not discharge lounge appropriate due to oxygen therapy.

## 2023-03-25 NOTE — Progress Notes (Signed)
Patient's urine has a strong malodorous odor to it. Cloudy and some sediment present. Patient denies any symptoms.

## 2023-03-25 NOTE — Assessment & Plan Note (Signed)
Continue blood pressure monitoring.  Patient on spironolactone.

## 2023-03-25 NOTE — Assessment & Plan Note (Signed)
Continue statin therapy.

## 2023-03-25 NOTE — TOC Progression Note (Addendum)
Transition of Care [facility]) - Progression Note    Patient Details  Name: Monica Zamora MRN: [id number] Date of Birth: [birth year]  [facility] CM/SW Contact  Elliot Cousin, RN Phone Number: [phone] 03/25/2023, 8:45 AM  Clinical Narrative:   HF TOC CM contacted [employer] for a 3n1 bedside commode to be delivered to room prior to dc. Contacted [facility] rep, [place]. Spoke to pt and nebulizer was delivered to room and oxygen.   PCP scheduled -hospital follow up appt- 04/08/2023 130 pm, Dyke Brackett NP (your provider is on vacation)     Expected Discharge Plan: Home w Home Health Services Barriers to Discharge: No Barriers Identified  Expected Discharge Plan and Services   Discharge Planning Services: CM Consult Post Acute Care Choice: Home Health Living arrangements for the past 2 months: Single Family Home                 DME Arranged: 3-N-1 DME Agency: [facility] Date DME Agency Contacted: 03/25/23 Time DME Agency Contacted: [phone] Representative spoke with at DME Agency: [employer] HH Arranged: RN, PT, OT HH Agency: [facility] Date [facility] Agency Contacted: 03/25/23 Time HH Agency Contacted: [phone] Representative spoke with at [facility] Agency: Tresa Endo   Social Determinants of Health (SDOH) Interventions SDOH Screenings   Food Insecurity: No Food Insecurity (03/13/2023)  Housing: Low Risk  (03/13/2023)  Transportation Needs: No Transportation Needs (03/13/2023)  Alcohol Screen: Low Risk  (03/13/2023)  Depression (PHQ2-9): Low Risk  (06/24/2021)  Recent Concern: Depression (PHQ2-9) - Medium Risk (05/06/2021)  Financial Resource Strain: Low Risk  (03/13/2023)  Tobacco Use: Medium Risk (03/23/2023)    Readmission Risk Interventions     No data to display

## 2023-03-25 NOTE — Assessment & Plan Note (Signed)
Echocardiogram with reduced LV systolic function EF 35 to 40%, global hypokinesis, mild LVH, RV systolic function with moderate reduction, RVSP 52.2. moderate TR   07/02 cardiac catheterization.  Significant native CAD, with 3 of 3 patent grafts.  PAP mean 42 PCWP 35 Cardiac output 4,5 and index 2,35 (Fick).  Patient underwent diuresis (furosemide, acetazolamide), negative fluid balance was achieved, -5442 ml, with significant improvement in her symptoms.  PICC line was placed to guide diuresis.

## 2023-03-25 NOTE — TOC CM/SW Note (Signed)
Due to limited mobility, patient is confined to one room. Patient due to medications for chronic medical condition that cause urgent need to use the bathroom, a bedside commode will help prevent and reduce risk from falls.

## 2023-03-25 NOTE — Plan of Care (Signed)
  Problem: Education: Goal: Knowledge of General Education information will improve Description: Including pain rating scale, medication(s)/side effects and non-pharmacologic comfort measures Outcome: Adequate for Discharge   Problem: Health Behavior/Discharge Planning: Goal: Ability to manage health-related needs will improve Outcome: Adequate for Discharge   Problem: Clinical Measurements: Goal: Ability to maintain clinical measurements within normal limits will improve Outcome: Adequate for Discharge Goal: Will remain free from infection Outcome: Adequate for Discharge Goal: Diagnostic test results will improve Outcome: Adequate for Discharge Goal: Respiratory complications will improve Outcome: Adequate for Discharge Goal: Cardiovascular complication will be avoided Outcome: Adequate for Discharge   Problem: Activity: Goal: Risk for activity intolerance will decrease Outcome: Adequate for Discharge   Problem: Nutrition: Goal: Adequate nutrition will be maintained Outcome: Adequate for Discharge   Problem: Coping: Goal: Level of anxiety will decrease Outcome: Adequate for Discharge   Problem: Elimination: Goal: Will not experience complications related to bowel motility Outcome: Adequate for Discharge Goal: Will not experience complications related to urinary retention Outcome: Adequate for Discharge   Problem: Pain Managment: Goal: General experience of comfort will improve Outcome: Adequate for Discharge   Problem: Safety: Goal: Ability to remain free from injury will improve Outcome: Adequate for Discharge   Problem: Skin Integrity: Goal: Risk for impaired skin integrity will decrease Outcome: Adequate for Discharge   Problem: Acute Rehab PT Goals(only PT should resolve) Goal: Pt Will Go Supine/Side To Sit Outcome: Adequate for Discharge Goal: Patient Will Transfer Sit To/From Stand Outcome: Adequate for Discharge Goal: Pt Will Ambulate Outcome: Adequate  for Discharge   Problem: Acute Rehab OT Goals (only OT should resolve) Goal: Pt. Will Perform Lower Body Dressing Outcome: Adequate for Discharge Goal: Pt. Will Perform Tub/Shower Transfer Outcome: Adequate for Discharge Goal: OT Additional ADL Goal #1 Outcome: Adequate for Discharge   Problem: Education: Goal: Understanding of CV disease, CV risk reduction, and recovery process will improve Outcome: Adequate for Discharge Goal: Individualized Educational Video(s) Outcome: Adequate for Discharge   Problem: Activity: Goal: Ability to return to baseline activity level will improve Outcome: Adequate for Discharge   Problem: Cardiovascular: Goal: Ability to achieve and maintain adequate cardiovascular perfusion will improve Outcome: Adequate for Discharge Goal: Vascular access site(s) Level 0-1 will be maintained Outcome: Adequate for Discharge   Problem: Health Behavior/Discharge Planning: Goal: Ability to safely manage health-related needs after discharge will improve Outcome: Adequate for Discharge

## 2023-03-25 NOTE — Assessment & Plan Note (Addendum)
CKD stage 3a.   Patient underwent diuresis with improvement in volume status.  At the time of her discharge her serum cr is 1,73 with K at 3,5 and serum bicarbonate at 27.  Patient will continue diuresis with furosemide and added spironolactone.  Close follow up renal function and electrolytes as outpatient . Hold on K supplementation for now.   Right renal artery stenosis. Patient will benefit from ACE inh or ARB with close follow up on renal function.

## 2023-03-25 NOTE — Care Management Important Message (Signed)
Important Message  Patient Details  Name: Monica Zamora MRN: [id number] Date of Birth: [birth year]   Medicare Important Message Given:  Yes     Renie Ora 03/25/2023, 11:06 AM

## 2023-03-26 DIAGNOSIS — I5023 Acute on chronic systolic (congestive) heart failure: Secondary | ICD-10-CM | POA: Diagnosis not present

## 2023-03-26 DIAGNOSIS — N1831 Chronic kidney disease, stage 3a: Secondary | ICD-10-CM | POA: Diagnosis not present

## 2023-03-26 DIAGNOSIS — I739 Peripheral vascular disease, unspecified: Secondary | ICD-10-CM | POA: Diagnosis not present

## 2023-03-26 DIAGNOSIS — J9611 Chronic respiratory failure with hypoxia: Secondary | ICD-10-CM | POA: Diagnosis not present

## 2023-03-26 DIAGNOSIS — I130 Hypertensive heart and chronic kidney disease with heart failure and stage 1 through stage 4 chronic kidney disease, or unspecified chronic kidney disease: Secondary | ICD-10-CM | POA: Diagnosis not present

## 2023-03-26 DIAGNOSIS — N179 Acute kidney failure, unspecified: Secondary | ICD-10-CM | POA: Diagnosis not present

## 2023-03-26 DIAGNOSIS — I701 Atherosclerosis of renal artery: Secondary | ICD-10-CM | POA: Diagnosis not present

## 2023-03-26 DIAGNOSIS — J449 Chronic obstructive pulmonary disease, unspecified: Secondary | ICD-10-CM | POA: Diagnosis not present

## 2023-03-26 DIAGNOSIS — I4821 Permanent atrial fibrillation: Secondary | ICD-10-CM | POA: Diagnosis not present

## 2023-03-26 NOTE — Progress Notes (Signed)
ADVANCED HF CLINIC CONSULT NOTE  Referring Physician: Rebecka Apley, NP Primary Care: Rebecka Apley, NP Primary Cardiologist: Olga Millers, MD HF Cardiologist: Dr. Gasper Lloyd  HPI: Monica Zamora is a 72 y.o.  female with paroxysmal atrial fibrillation, hypertension, chronic hypoxemic respiratory failure on 2 L nasal cannula, CAD status post CABG in 2021, CAD, heart failure with reduced ejection fraction (EF 40%), CKDIIIa, hx of tobacco use. Admitted 03/13/2023 with substernal chest pain with radiation to the epigastrium. Underwent LHC/RHC, biventricular elevated filling pressures, low PAPi but preserved CI, native CAD with 3/3 patent grafts. HF saw due to severely elevated filling pressures. While admitted she was in a fib RVR, required amio gtt. Diuresed well with IV lasix. Chest pain resolved with diuresis. TEE/DCCV 7/5 with EF 30-35%, moderate RV dysfunction, mod-severe TR, IVC dilated. A fib>NSR. Discharge weight 161 lbs.   Today she returns for post hospital follow up with his caregiver. Overall feeling good. Denies palpitations, CP, dizziness, edema, or PND/Orthopnea. Occasional SOB when off oxygen. Appetite good, not eating any salt, no fast food. No fever or chills. Weight at home 159 pounds. Taking all medications. Denies ETOH, smoking.   Cardiac Studies Reviewed:  TEE 7/24 with EF 30-35%, moderate RV dysfunction, mod-severe TR, IVC dilated.  Echo 6/24 EF to 40 to 45% and severely elevated PASP of 77  Echo 2022 EF of 50 to 55%.   Past Medical History:  Diagnosis Date   A-fib (HCC)    Carotid artery disease (HCC)    COPD (chronic obstructive pulmonary disease) (HCC)    Coronary artery disease    Former tobacco use    Heart failure with mildly reduced ejection fraction (HFmrEF) (HCC)    Hx of CABG    Hypertension    PUD (peptic ulcer disease)     Current Outpatient Medications  Medication Sig Dispense Refill   acetaminophen (TYLENOL) 500 MG tablet Take  1,000 mg by mouth 2 (two) times daily as needed (pain).     albuterol (VENTOLIN HFA) 108 (90 Base) MCG/ACT inhaler Inhale 1 puff into the lungs 2 (two) times daily as needed for wheezing or shortness of breath.     ALPRAZolam (XANAX) 0.5 MG tablet Take 1 tablet (0.5 mg total) by mouth at bedtime as needed for anxiety. 30 tablet 0   amiodarone (PACERONE) 200 MG tablet Take 1 tablet (200 mg total) by mouth daily. 30 tablet 0   apixaban (ELIQUIS) 5 MG TABS tablet Take 1 tablet (5 mg total) by mouth 2 (two) times daily. 60 tablet 3   aspirin 81 MG chewable tablet Chew 81 mg by mouth daily.     furosemide (LASIX) 20 MG tablet Take 3 tablets (60 mg total) by mouth daily. 90 tablet 0   levalbuterol (XOPENEX) 1.25 MG/0.5ML nebulizer solution Take 1.25 mg by nebulization every 4 (four) hours as needed for wheezing or shortness of breath. 1 each 12   pravastatin (PRAVACHOL) 20 MG tablet Take 1 tablet (20 mg total) by mouth daily at 6 PM. 90 tablet 1   sertraline (ZOLOFT) 100 MG tablet Take 100 mg by mouth.     spironolactone (ALDACTONE) 25 MG tablet Take 0.5 tablets (12.5 mg total) by mouth daily. 15 tablet 0   No current facility-administered medications for this encounter.   No Known Allergies   Social History   Socioeconomic History   Marital status: Divorced    Spouse name: Not on file   Number of children: 2   Years  of education: Not on file   Highest education level: 11th grade  Occupational History   Occupation: Retired  Tobacco Use   Smoking status: Former    Current packs/day: 0.00    Average packs/day: 1.5 packs/day for 54.9 years (82.3 ttl pk-yrs)    Types: Cigarettes    Start date: 09/16/1967    Quit date: 08/05/2022    Years since quitting: 0.6   Smokeless tobacco: Never  Vaping Use   Vaping status: Never Used  Substance and Sexual Activity   Alcohol use: Not Currently   Drug use: Never   Sexual activity: Not on file  Other Topics Concern   Not on file  Social History  Narrative   Not on file   Social Determinants of Health   Financial Resource Strain: Low Risk  (03/13/2023)   Overall Financial Resource Strain (CARDIA)    Difficulty of Paying Living Expenses: Not very hard  Food Insecurity: No Food Insecurity (03/13/2023)   Hunger Vital Sign    Worried About Running Out of Food in the Last Year: Never true    Ran Out of Food in the Last Year: Never true  Transportation Needs: No Transportation Needs (03/13/2023)   PRAPARE - Administrator, Civil Service (Medical): No    Lack of Transportation (Non-Medical): No  Physical Activity: Unknown (05/15/2022)   Received from [facility], [facility]   Exercise Vital Sign    Days of Exercise per Week: 0 days    Minutes of Exercise per Session: Not on file  Stress: Stress Concern Present (05/15/2022)   Received from [facility], [facility] - Occupational Stress Questionnaire    Feeling of Stress : Very much  Social Connections: Socially Integrated (05/15/2022)   Received from [facility], [facility]   Social Network    How would you rate your social network (family, work, friends)?: Good participation with social networks  Intimate Partner Violence: Unknown (05/15/2022)   Received from [facility], [facility]   HITS    Over the last 12 months how often did your partner physically hurt you?: 1    Insult or Talk Down To: Not on file    Over the last 12 months how often did your partner threaten you with physical harm?: 1    Over the last 12 months how often did your partner scream or curse at you?: 1    Family History  Problem Relation Age of Onset   Lupus Mother    Lung disease Neg Hx    Vitals:   04/02/23 1156  BP: (!) 154/80  Pulse: 78  SpO2: 98%  Weight: 73.3 kg (161 lb 9.6 oz)    Wt Readings from Last 3 Encounters:  04/02/23 73.3 kg (161 lb 9.6 oz)  03/25/23 73.4 kg (161 lb 13.1 oz)  03/12/23 81.1 kg (178 lb 12.8 oz)     PHYSICAL EXAM: General:  chronically ill appearing.  No respiratory difficulty. Walked in with walker HEENT: normal Neck: supple. JVD difficult to see but does not appear elevated. Carotids 2+ bilat; no bruits. No lymphadenopathy or thyromegaly appreciated. Cor: PMI nondisplaced. Regular rate & rhythm. No rubs, gallops or murmurs. Lungs: diminished Abdomen: soft, nontender, nondistended. No hepatosplenomegaly. No bruits or masses. Good bowel sounds. Extremities: no cyanosis, clubbing, rash, edema  Neuro: alert & oriented x 3, cranial nerves grossly intact. moves all 4 extremities w/o difficulty. Affect pleasant.   ECG: NSR 70s (Personally  reviewed)    ASSESSMENT & PLAN: 1. Chronic systolic CHF:  Echo 7/24 EF 35-40%, moderately decreased RV systolic function.  RHC 7/24: biventricular elevated filling pressures, low PAPi but preserved CI.  Pulmonary venous hypertension.  Suspect ischemic cardiomyopathy + component of cor pulmonale from COPD.  TEE 7/5 with EF 30-35%, moderate RV dysfunction, mod-severe TR, IVC dilated.   - NYHA II-early IIIa - volume appears stable. Continue lasix 60 mg daily - Restart Toprol XL 12.5 mg QHS - Continue spiro 12.5 mg daily - Hold off on farxiga with recent UTI - She has significant bilateral renal artery stenosis by prior CTA but renal artery doppler does not suggest hemodynamic significance.  Could probably use ARB or Entresto in the future but would like to see stable creatinine.  - BMET BNP, CBC today 2. CKD stage 3: Baseline 1.2-1.6 - BMET today  3. Atrial fibrillation: History of PAF.  AF this admission, was in NSR in 5/24.  Probably not a good long-term amiodarone candidate with severe COPD on home oxygen.   - Decrease amiodarone 200>100 mg daily to keep her in NSR for now. Would not continue this medication long-term given her lung disease. Plan to stop at f/u - Continue Eliquis.   - May be a candidate for Tikosyn in future to maintain NSR.  4. CAD s/p  CABG: Cath 7/2 with patent grafts.  No ACS. No chest pain.  - Continue statin.  5. COPD: Severe, on home oxygen.  6. Renal artery stenosis: By CTA.  Renal artery dopplers suggested 1-59% bilateral ICA stenosis.   7. Mesenteric artery stenosis: No intestinal angina reported.   Follow up with Dr. Gala Romney in 3 months.    Alen Bleacher AGACNP-BC  04/02/23  [facility] [address] [facility] [place] [address] [phone] (office) [phone] (fax)

## 2023-03-27 NOTE — Telephone Encounter (Signed)
ATC x1.  LVM to return call. 

## 2023-03-27 NOTE — Telephone Encounter (Signed)
Registered Nurse with [facility] calling now asking about these same issues on behalf of the PT. No replies since 6/28 from Triage. I advised [employer] I would send this back High Priority as a second request.

## 2023-03-27 NOTE — Telephone Encounter (Signed)
Called Melissa with [employer] to get status of patient getting POC.  I was advised that [employer] had been in contact with her and she was in the hospital.  They received the ok from their manager to deliver the POC to the hospital.  It was confirmed with Melissa with [employer] that the POC was delivered to her hospital room on 03/23/23 at 3:20 pm.  When patient returns call find out if there is something else she is needing since she should have her POC now.

## 2023-03-30 ENCOUNTER — Encounter (HOSPITAL_COMMUNITY): Payer: Medicare HMO

## 2023-03-30 DIAGNOSIS — J9611 Chronic respiratory failure with hypoxia: Secondary | ICD-10-CM | POA: Diagnosis not present

## 2023-03-30 DIAGNOSIS — I4821 Permanent atrial fibrillation: Secondary | ICD-10-CM | POA: Diagnosis not present

## 2023-03-30 DIAGNOSIS — I130 Hypertensive heart and chronic kidney disease with heart failure and stage 1 through stage 4 chronic kidney disease, or unspecified chronic kidney disease: Secondary | ICD-10-CM | POA: Diagnosis not present

## 2023-03-30 DIAGNOSIS — N179 Acute kidney failure, unspecified: Secondary | ICD-10-CM | POA: Diagnosis not present

## 2023-03-30 DIAGNOSIS — N1831 Chronic kidney disease, stage 3a: Secondary | ICD-10-CM | POA: Diagnosis not present

## 2023-03-30 DIAGNOSIS — I739 Peripheral vascular disease, unspecified: Secondary | ICD-10-CM | POA: Diagnosis not present

## 2023-03-30 DIAGNOSIS — I5023 Acute on chronic systolic (congestive) heart failure: Secondary | ICD-10-CM | POA: Diagnosis not present

## 2023-03-30 DIAGNOSIS — I701 Atherosclerosis of renal artery: Secondary | ICD-10-CM | POA: Diagnosis not present

## 2023-03-30 DIAGNOSIS — J449 Chronic obstructive pulmonary disease, unspecified: Secondary | ICD-10-CM | POA: Diagnosis not present

## 2023-03-31 ENCOUNTER — Telehealth: Payer: Self-pay | Admitting: Internal Medicine

## 2023-03-31 NOTE — Telephone Encounter (Signed)
Patient states our office needs to call [phone]. Also log into [email or web address] Patient phone number is [phone].

## 2023-03-31 NOTE — Telephone Encounter (Signed)
Patient states has not received portable oxygen concentrator. Received heavy oxygen machine. Patient phone number is [phone].

## 2023-04-02 ENCOUNTER — Ambulatory Visit (HOSPITAL_COMMUNITY)
Admit: 2023-04-02 | Discharge: 2023-04-02 | Disposition: A | Discharge status: Home / Self Care | Payer: Medicare HMO | Attending: Internal Medicine | Admitting: Internal Medicine

## 2023-04-02 ENCOUNTER — Encounter (HOSPITAL_COMMUNITY): Payer: Self-pay

## 2023-04-02 VITALS — BP 154/80 | HR 78 | Wt 161.6 lb

## 2023-04-02 DIAGNOSIS — I6523 Occlusion and stenosis of bilateral carotid arteries: Secondary | ICD-10-CM | POA: Insufficient documentation

## 2023-04-02 DIAGNOSIS — Z79899 Other long term (current) drug therapy: Secondary | ICD-10-CM | POA: Insufficient documentation

## 2023-04-02 DIAGNOSIS — K551 Chronic vascular disorders of intestine: Secondary | ICD-10-CM

## 2023-04-02 DIAGNOSIS — N1832 Chronic kidney disease, stage 3b: Secondary | ICD-10-CM

## 2023-04-02 DIAGNOSIS — I5022 Chronic systolic (congestive) heart failure: Secondary | ICD-10-CM

## 2023-04-02 DIAGNOSIS — I272 Pulmonary hypertension, unspecified: Secondary | ICD-10-CM | POA: Insufficient documentation

## 2023-04-02 DIAGNOSIS — N1831 Chronic kidney disease, stage 3a: Secondary | ICD-10-CM | POA: Insufficient documentation

## 2023-04-02 DIAGNOSIS — Z87891 Personal history of nicotine dependence: Secondary | ICD-10-CM | POA: Insufficient documentation

## 2023-04-02 DIAGNOSIS — J9611 Chronic respiratory failure with hypoxia: Secondary | ICD-10-CM | POA: Insufficient documentation

## 2023-04-02 DIAGNOSIS — I480 Paroxysmal atrial fibrillation: Secondary | ICD-10-CM | POA: Diagnosis not present

## 2023-04-02 DIAGNOSIS — I25810 Atherosclerosis of coronary artery bypass graft(s) without angina pectoris: Secondary | ICD-10-CM

## 2023-04-02 DIAGNOSIS — Z951 Presence of aortocoronary bypass graft: Secondary | ICD-10-CM | POA: Diagnosis not present

## 2023-04-02 DIAGNOSIS — J441 Chronic obstructive pulmonary disease with (acute) exacerbation: Secondary | ICD-10-CM

## 2023-04-02 DIAGNOSIS — I701 Atherosclerosis of renal artery: Secondary | ICD-10-CM | POA: Diagnosis not present

## 2023-04-02 DIAGNOSIS — I4821 Permanent atrial fibrillation: Secondary | ICD-10-CM | POA: Diagnosis not present

## 2023-04-02 DIAGNOSIS — I5023 Acute on chronic systolic (congestive) heart failure: Secondary | ICD-10-CM | POA: Diagnosis not present

## 2023-04-02 DIAGNOSIS — I2510 Atherosclerotic heart disease of native coronary artery without angina pectoris: Secondary | ICD-10-CM | POA: Diagnosis not present

## 2023-04-02 DIAGNOSIS — Z7901 Long term (current) use of anticoagulants: Secondary | ICD-10-CM | POA: Diagnosis not present

## 2023-04-02 DIAGNOSIS — I739 Peripheral vascular disease, unspecified: Secondary | ICD-10-CM | POA: Diagnosis not present

## 2023-04-02 DIAGNOSIS — I130 Hypertensive heart and chronic kidney disease with heart failure and stage 1 through stage 4 chronic kidney disease, or unspecified chronic kidney disease: Secondary | ICD-10-CM | POA: Insufficient documentation

## 2023-04-02 DIAGNOSIS — J449 Chronic obstructive pulmonary disease, unspecified: Secondary | ICD-10-CM | POA: Diagnosis not present

## 2023-04-02 DIAGNOSIS — N179 Acute kidney failure, unspecified: Secondary | ICD-10-CM | POA: Diagnosis not present

## 2023-04-02 LAB — BASIC METABOLIC PANEL
Anion gap: 8 (ref 5–15)
BUN: 34 mg/dL — ABNORMAL HIGH (ref 8–23)
CO2: 28 mmol/L (ref 22–32)
Calcium: 8.9 mg/dL (ref 8.9–10.3)
Chloride: 99 mmol/L (ref 98–111)
Creatinine, Ser: 1.24 mg/dL — ABNORMAL HIGH (ref 0.44–1.00)
GFR, Estimated: 47 mL/min — ABNORMAL LOW (ref >60)
Glucose, Bld: 133 mg/dL — ABNORMAL HIGH (ref 70–99)
Potassium: 3.7 mmol/L (ref 3.5–5.1)
Sodium: 135 mmol/L (ref 135–145)

## 2023-04-02 LAB — CBC
HCT: 38.6 % (ref 36.0–46.0)
Hemoglobin: 12.5 g/dL (ref 12.0–15.0)
MCH: 33.6 pg (ref 26.0–34.0)
MCHC: 32.4 g/dL (ref 30.0–36.0)
MCV: 103.8 fL — ABNORMAL HIGH (ref 80.0–100.0)
Platelets: 240 10*3/uL (ref 150–400)
RBC: 3.72 MIL/uL — ABNORMAL LOW (ref 3.87–5.11)
RDW: 14.1 % (ref 11.5–15.5)
WBC: 6.4 10*3/uL (ref 4.0–10.5)
nRBC: 0 % (ref 0.0–0.2)

## 2023-04-02 LAB — BRAIN NATRIURETIC PEPTIDE: B Natriuretic Peptide: 360.9 pg/mL — ABNORMAL HIGH (ref 0.0–100.0)

## 2023-04-02 MED ORDER — METOPROLOL SUCCINATE ER 25 MG PO TB24: 12.5000 mg | ORAL_TABLET | Freq: Every day | ORAL | 3 refills | Status: DC

## 2023-04-02 MED ORDER — AMIODARONE HCL 100 MG PO TABS: 100.0000 mg | ORAL_TABLET | Freq: Every day | ORAL | 0 refills | Status: DC

## 2023-04-02 NOTE — Patient Instructions (Signed)
Medication Changes:  RESTART metoprolol XL 12.5 mg daily at bedtime   DECREASE amiodarone to 100 mg DAILY    *If you need a refill on your cardiac medications before your next appointment, please call your pharmacy*  Lab Work:  Labs done today, your results will be available in MyChart, we will contact you for abnormal readings.   Follow-Up in:   Your physician recommends that you schedule a follow-up appointment in: 3 months with Dr. Gasper Lloyd   Do the following things EVERYDAY: Weigh yourself in the morning before breakfast. Write it down and keep it in a log. Take your medicines as prescribed Eat low salt foods--Limit salt (sodium) to 2000 mg per day.  Stay as active as you can everyday Limit all fluids for the day to less than 2 liters    Need to Contact us:  If you have any questions or concerns before your next appointment please send [place] a message through [place] or call our office at [phone].    TO LEAVE A MESSAGE FOR THE NURSE SELECT OPTION 2, PLEASE LEAVE A MESSAGE INCLUDING: YOUR NAME DATE OF BIRTH CALL BACK NUMBER REASON FOR CALL**this is important as we prioritize the call backs  YOU WILL RECEIVE A CALL BACK THE SAME DAY AS LONG AS YOU CALL BEFORE 4:00 PM   At the [facility], you and your health needs are our priority. As part of our continuing mission to provide you with exceptional heart care, we have created designated Provider Care Teams. These Care Teams include your primary Cardiologist (physician) and Advanced Practice Providers (APPs- Physician Assistants and Nurse Practitioners) who all work together to provide you with the care you need, when you need it.   You may see any of the following providers on your designated Care Team at your next follow up: Dr Arvilla Meres Dr [name] Dr. Marcos Eke, NP Robbie Lis, [place] [facility] [place], [place] Brynda Peon, NP Karle Plumber,  PharmD   Please be sure to bring in all your medications bottles to every appointment.    Thank you for choosing [facility]

## 2023-04-06 DIAGNOSIS — I4821 Permanent atrial fibrillation: Secondary | ICD-10-CM | POA: Diagnosis not present

## 2023-04-06 DIAGNOSIS — I5023 Acute on chronic systolic (congestive) heart failure: Secondary | ICD-10-CM | POA: Diagnosis not present

## 2023-04-06 DIAGNOSIS — I739 Peripheral vascular disease, unspecified: Secondary | ICD-10-CM | POA: Diagnosis not present

## 2023-04-06 DIAGNOSIS — N179 Acute kidney failure, unspecified: Secondary | ICD-10-CM | POA: Diagnosis not present

## 2023-04-06 DIAGNOSIS — I130 Hypertensive heart and chronic kidney disease with heart failure and stage 1 through stage 4 chronic kidney disease, or unspecified chronic kidney disease: Secondary | ICD-10-CM | POA: Diagnosis not present

## 2023-04-06 DIAGNOSIS — J9611 Chronic respiratory failure with hypoxia: Secondary | ICD-10-CM | POA: Diagnosis not present

## 2023-04-06 DIAGNOSIS — J449 Chronic obstructive pulmonary disease, unspecified: Secondary | ICD-10-CM | POA: Diagnosis not present

## 2023-04-06 DIAGNOSIS — I701 Atherosclerosis of renal artery: Secondary | ICD-10-CM | POA: Diagnosis not present

## 2023-04-06 DIAGNOSIS — N1831 Chronic kidney disease, stage 3a: Secondary | ICD-10-CM | POA: Diagnosis not present

## 2023-04-07 ENCOUNTER — Telehealth: Payer: Self-pay | Admitting: Cardiology

## 2023-04-07 DIAGNOSIS — I5022 Chronic systolic (congestive) heart failure: Secondary | ICD-10-CM

## 2023-04-07 NOTE — Telephone Encounter (Signed)
Patient will like an order for a portable oxygen tank such as the [employer]. Pt stated her current oxygen tank is too heavy to carry. Pt stated she did call [employer], was advised her health insurance policy will cover the cost. Will forward to MD and nurse for advise.

## 2023-04-07 NOTE — Telephone Encounter (Signed)
Spoke with pt, she reports she is getting the run around and pulmonary told her to call us. She gets her oxygen from [facility]. Spoke with [employer], will get order signed tomorrow and fax to [phone].

## 2023-04-07 NOTE — Telephone Encounter (Signed)
Spoke to the patient, she is currently on continuous oxygen the current tank is too heavy for her to carry around. While on the phone pt realized she ment to call her pulmonologist.  Advised pt to call the office if she has any additional questions.

## 2023-04-07 NOTE — Telephone Encounter (Signed)
Follow Up:    Patient called back. She said she had called her Pulmonary office and they told her to call her Cardiologist to get the oxygen.

## 2023-04-07 NOTE — Telephone Encounter (Signed)
Patient calling about the oxygen tank she is suppose to received but havent received yet. Please advise

## 2023-04-08 ENCOUNTER — Telehealth: Payer: Self-pay | Admitting: Cardiology

## 2023-04-08 ENCOUNTER — Encounter: Payer: Self-pay | Admitting: *Deleted

## 2023-04-08 DIAGNOSIS — N1832 Chronic kidney disease, stage 3b: Secondary | ICD-10-CM | POA: Diagnosis not present

## 2023-04-08 DIAGNOSIS — F411 Generalized anxiety disorder: Secondary | ICD-10-CM | POA: Diagnosis not present

## 2023-04-08 DIAGNOSIS — F99 Mental disorder, not otherwise specified: Secondary | ICD-10-CM | POA: Diagnosis not present

## 2023-04-08 DIAGNOSIS — J449 Chronic obstructive pulmonary disease, unspecified: Secondary | ICD-10-CM | POA: Diagnosis not present

## 2023-04-08 DIAGNOSIS — I10 Essential (primary) hypertension: Secondary | ICD-10-CM | POA: Diagnosis not present

## 2023-04-08 DIAGNOSIS — I2510 Atherosclerotic heart disease of native coronary artery without angina pectoris: Secondary | ICD-10-CM | POA: Diagnosis not present

## 2023-04-08 DIAGNOSIS — F5105 Insomnia due to other mental disorder: Secondary | ICD-10-CM | POA: Diagnosis not present

## 2023-04-08 DIAGNOSIS — I480 Paroxysmal atrial fibrillation: Secondary | ICD-10-CM | POA: Diagnosis not present

## 2023-04-08 DIAGNOSIS — I5022 Chronic systolic (congestive) heart failure: Secondary | ICD-10-CM | POA: Diagnosis not present

## 2023-04-08 NOTE — Telephone Encounter (Signed)
Spoke with pt, aware I confirmed the order has been received and they are working on it now. Patient is seeing PCP today but will not have oxygen to take with her.

## 2023-04-08 NOTE — Telephone Encounter (Signed)
Spoke with pt, aware order has been faxed to [employer] [facility].

## 2023-04-08 NOTE — Telephone Encounter (Signed)
Called and spoke with patient.  Patient stated she received O2 tanks and home concentrator, but never received her POC. O2 qualifying walk was completed and POC was ordered by Dr. Celine Mans 02/27/23.  Patient stated she has called [employer] several times and has been transferred to several people with no help. Patient stated she called her insurance and was told she was approved for a POC.  Called and spoke with Monica Zamora, [employer] [facility]. Monica Zamora stated patient received O2 including POC 03/23/23. Monica Zamora stated patient signed for delivery stating she received POC.   Called and spoke with patient.  Advised patient what Monica Zamora had said and O2 receipt signed.  Patient stated she signed for what she received.  Patient stated she received a green tank with wheels and green tanks, but no POC.  Patient thought she would receive POC at a later time.  Called and spoke with Monica Zamora, [employer]. Explained situation.  Monica Zamora is going to check delivery.  Monica Zamora stated sometimes drivers have pictures of equipment delivered. Monica Zamora stated she would call back with updated details.

## 2023-04-08 NOTE — Telephone Encounter (Signed)
Spoke to the patient, she called [employer] [facility] and was advised they have not received order for portable oxygen tank. Pt stated she has a appointment today with PCP. Pt stated she will have to go to the appointment without her oxygen tank because " it's too heavy to carry". Advised pt to call the office and discuss if visit can be changed to virtual visit.  Pt stated PCP will like for her to be seen in the office. Will forward to nurse for advise.

## 2023-04-08 NOTE — Telephone Encounter (Signed)
Received call from [facility], [employer].  Melissa stated patient did receive POC at hospital per patient request.  Efraim Kaufmann stated she would reach out to her branch manager to let them know patient does not have POC.  I called and updated patient. I advised patient to check with hospital unit for a possible patient lost and found or ask to speak with a case manager that arranged her O2.  Understanding stated.  Nothing further at this time.

## 2023-04-08 NOTE — Telephone Encounter (Signed)
Pt is calling stating she spoke with [employer] [facility] this morning and they informed her that they hadn't received the fax order for Portable Oxygen. She'd like a callback to see if it can be faxed over again. Please advise

## 2023-04-13 ENCOUNTER — Other Ambulatory Visit (HOSPITAL_COMMUNITY): Payer: Self-pay

## 2023-04-13 DIAGNOSIS — I4821 Permanent atrial fibrillation: Secondary | ICD-10-CM | POA: Diagnosis not present

## 2023-04-13 DIAGNOSIS — I5023 Acute on chronic systolic (congestive) heart failure: Secondary | ICD-10-CM | POA: Diagnosis not present

## 2023-04-13 DIAGNOSIS — I130 Hypertensive heart and chronic kidney disease with heart failure and stage 1 through stage 4 chronic kidney disease, or unspecified chronic kidney disease: Secondary | ICD-10-CM | POA: Diagnosis not present

## 2023-04-13 DIAGNOSIS — J9611 Chronic respiratory failure with hypoxia: Secondary | ICD-10-CM | POA: Diagnosis not present

## 2023-04-13 DIAGNOSIS — I739 Peripheral vascular disease, unspecified: Secondary | ICD-10-CM | POA: Diagnosis not present

## 2023-04-13 DIAGNOSIS — N1831 Chronic kidney disease, stage 3a: Secondary | ICD-10-CM | POA: Diagnosis not present

## 2023-04-13 DIAGNOSIS — N179 Acute kidney failure, unspecified: Secondary | ICD-10-CM | POA: Diagnosis not present

## 2023-04-13 DIAGNOSIS — J449 Chronic obstructive pulmonary disease, unspecified: Secondary | ICD-10-CM | POA: Diagnosis not present

## 2023-04-13 DIAGNOSIS — I701 Atherosclerosis of renal artery: Secondary | ICD-10-CM | POA: Diagnosis not present

## 2023-04-15 NOTE — Progress Notes (Unsigned)
Cardiology Clinic Note   Patient Name: Monica Zamora Date of Encounter: 04/16/2023  Primary Care Provider:  Rebecka Apley, NP Primary Cardiologist:  Olga Millers, MD  Patient Profile    72 year old female patient with history of PAF, hypertension, CAD, history of CABG in [place] 2021, carotid artery disease, HFrEF , chronic hypoxic respiratory failure.  Recent admission from 03/13/2023 to 03/25/2023 in the setting of acute on chronic systolic heart failure, chest pain, acute kidney injury superimposed on CKD, PAD with history of severe RAS, occlusion of the celiac origin and high-grade stenosis of the inferior mesenteric artery region and common iliac artery bilaterally,.  Was seen by Advanced Heart Failure team with Dr. Shirlee Latch.    Underwent both right and left heart cath on 03/17/2023 revealing severe combined CHF with severely elevated right-sided pressures (RAP mean 24 mmHg with largest mild way; RV EDP 57/8-17 mmHg, mean PAP 42).  She was also found to have severe native vessel CAD with 3 of 3 patent grafts.  She was diuresed.  With negative 5442 ms through PICC line.  Echocardiogram revealed further reduction in EF to 35 to 40% with global hypokinesis.  Had DCCV on 03/20/2023 that was successful in converting her from atrial fibs to normal sinus rhythm, she was continued on amiodarone and Eliquis.  Beta-blocker was discontinued to prevent bradycardia she had been on metoprolol.  Past Medical History    Past Medical History:  Diagnosis Date   A-fib [facility])    Carotid artery disease (HCC)    COPD (chronic obstructive pulmonary disease) (HCC)    Coronary artery disease    Former tobacco use    Heart failure with mildly reduced ejection fraction (HFmrEF) (HCC)    Hx of CABG    Hypertension    PUD (peptic ulcer disease)    Past Surgical History:  Procedure Laterality Date   ABDOMINAL HYSTERECTOMY     CARDIOVERSION N/A 03/20/2023   Procedure: CARDIOVERSION;  Surgeon: Laurey Morale, MD;  Location: [facility] INVASIVE CV LAB;  Service: Cardiovascular;  Laterality: N/A;   CHOLECYSTECTOMY     CORONARY ARTERY BYPASS GRAFT     RIGHT/LEFT HEART CATH AND CORONARY/GRAFT ANGIOGRAPHY N/A 03/17/2023   Procedure: RIGHT/LEFT HEART CATH AND CORONARY/GRAFT ANGIOGRAPHY;  Surgeon: Marykay Lex, MD;  Location: [facility] INVASIVE CV LAB;  Service: Cardiovascular;  Laterality: N/A;   ruptured disc     SPINE SURGERY     TEE WITHOUT CARDIOVERSION N/A 03/20/2023   Procedure: TRANSESOPHAGEAL ECHOCARDIOGRAM;  Surgeon: Laurey Morale, MD;  Location: [facility] INVASIVE CV LAB;  Service: Cardiovascular;  Laterality: N/A;    Allergies  No Known Allergies  History of Present Illness    [name] presents today for post hospitalization follow-up after admission for acute on chronic HFrEF, with diastolic heart failure, chest pain, status post cardiac catheterization revealing severe pulmonary hypertension, most recent echo revealing EF of 35 to 40% with global hypokinesis, was excellent diuresis of greater than 5000 mL.  Metoprolol was discontinued in the setting of bradycardia, she remained in atrial fibrillation on admission, but did have cardioversion on 03/20/2023 that was successful in converting her to normal sinus rhythm, sinus bradycardia.  She comes today feeling very well, her son who is with her manages her medications and make sure that she has plenty refills, fills her pill dispenser, provides low-sodium diet and goes grocery shopping for her to make sure that she has low sodium foods available.  She states she is feeling very well,  breathing better, no complaints of lower extremity edema.  She is slowly getting her stamina back over the last month.  Denies any issues with bleeding, dyspnea on exertion, she continues to have some mild fatigue.  Home Medications    Current Outpatient Medications  Medication Sig Dispense Refill   acetaminophen (TYLENOL) 500 MG tablet Take 1,000 mg by mouth 2 (two) times  daily as needed (pain).     albuterol (VENTOLIN HFA) 108 (90 Base) MCG/ACT inhaler Inhale 1 puff into the lungs 2 (two) times daily as needed for wheezing or shortness of breath.     ALPRAZolam (XANAX) 0.5 MG tablet Take 1 tablet (0.5 mg total) by mouth at bedtime as needed for anxiety. 30 tablet 0   amiodarone (PACERONE) 100 MG tablet Take 1 tablet (100 mg total) by mouth daily. 30 tablet 0   apixaban (ELIQUIS) 5 MG TABS tablet Take 1 tablet (5 mg total) by mouth 2 (two) times daily. 60 tablet 3   aspirin 81 MG chewable tablet Chew 81 mg by mouth daily.     metoprolol succinate (TOPROL-XL) 25 MG 24 hr tablet Take 0.5 tablets (12.5 mg total) by mouth at bedtime. Take with or immediately following a meal. 90 tablet 3   sertraline (ZOLOFT) 100 MG tablet Take 100 mg by mouth.     furosemide (LASIX) 20 MG tablet Take 3 tablets (60 mg total) by mouth daily. 270 tablet 2   levalbuterol (XOPENEX) 1.25 MG/0.5ML nebulizer solution Take 1.25 mg by nebulization every 4 (four) hours as needed for wheezing or shortness of breath. (Patient not taking: Reported on 04/16/2023) 1 each 12   pravastatin (PRAVACHOL) 20 MG tablet Take 1 tablet (20 mg total) by mouth daily at 6 PM. 90 tablet 3   spironolactone (ALDACTONE) 25 MG tablet Take 0.5 tablets (12.5 mg total) by mouth daily. 90 tablet 3   No current facility-administered medications for this visit.     Family History    Family History  Problem Relation Age of Onset   Lupus Mother    Lung disease Neg Hx    She indicated that her mother is deceased. She indicated that her father is deceased. She indicated that her daughter is deceased. She indicated that the status of her neg hx is unknown.  Social History    Social History   Socioeconomic History   Marital status: Divorced    Spouse name: Not on file   Number of children: 2   Years of education: Not on file   Highest education level: 11th grade  Occupational History   Occupation: Retired  Tobacco  Use   Smoking status: Former    Current packs/day: 0.00    Average packs/day: 1.5 packs/day for 54.9 years (82.3 ttl pk-yrs)    Types: Cigarettes    Start date: 09/16/1967    Quit date: 08/05/2022    Years since quitting: 0.6   Smokeless tobacco: Never  Vaping Use   Vaping status: Never Used  Substance and Sexual Activity   Alcohol use: Not Currently   Drug use: Never   Sexual activity: Not on file  Other Topics Concern   Not on file  Social History Narrative   Not on file   Social Determinants of Health   Financial Resource Strain: Low Risk  (04/08/2023)   Received from [facility]   Overall Financial Resource Strain (CARDIA)    Difficulty of Paying Living Expenses: Not hard at all  Food Insecurity: No Food Insecurity (04/08/2023)  Received from [facility] Vital Sign    Worried About Running Out of Food in the Last Year: Never true    Ran Out of Food in the Last Year: Never true  Transportation Needs: No Transportation Needs (04/08/2023)   Received from [facility] - Transportation    Lack of Transportation (Medical): No    Lack of Transportation (Non-Medical): No  Physical Activity: Unknown (05/15/2022)   Received from [facility], [facility]   Exercise Vital Sign    Days of Exercise per Week: 0 days    Minutes of Exercise per Session: Not on file  Stress: Stress Concern Present (05/15/2022)   Received from [facility], [facility] - Occupational Stress Questionnaire    Feeling of Stress : Very much  Social Connections: Socially Integrated (05/15/2022)   Received from [facility], [facility]   Social Network    How would you rate your social network (family, work, friends)?: Good participation with social networks  Intimate Partner Violence: Unknown (05/15/2022)   Received from [facility], [facility]   HITS    Over the last 12 months how often did your partner physically hurt you?: 1     Insult or Talk Down To: Not on file    Over the last 12 months how often did your partner threaten you with physical harm?: 1    Over the last 12 months how often did your partner scream or curse at you?: 1     Review of Systems    General:  No chills, fever, night sweats or weight changes.  Cardiovascular:  No chest pain, dyspnea on exertion, edema, orthopnea, palpitations, paroxysmal nocturnal dyspnea. Dermatological: No rash, lesions/masses Respiratory: No cough, dyspnea Urologic: No hematuria, dysuria Abdominal:   No nausea, vomiting, diarrhea, bright red blood per rectum, melena, or hematemesis Neurologic:  No visual changes, wkns, changes in mental status. All other systems reviewed and are otherwise negative except as noted above.  EKG Interpretation Date/Time:  Thursday April 16 2023 15:15:10 EDT Ventricular Rate:  55 PR Interval:  166 QRS Duration:  90 QT Interval:  506 QTC Calculation: 484 R Axis:   63  Text Interpretation: Sinus bradycardia with sinus arrhythmia When compared with ECG of 02-Apr-2023 12:12, No significant change was found Confirmed by Joni Reining [phone]) on 04/16/2023 5:15:14 PM    Physical Exam    VS:  BP 124/84   Pulse 60   Ht 5\' 6"  (1.676 m)   Wt 162 lb 12.8 oz (73.8 kg)   SpO2 94%   BMI 26.28 kg/m  , BMI Body mass index is 26.28 kg/m.     GEN: Well nourished, well developed, in no acute distress. HEENT: normal. Neck: Supple, no JVD, carotid bruits, or masses. Cardiac: IRRR, no murmurs, rubs, or gallops. No clubbing, cyanosis, edema.  Radials/DP/PT 2+ and equal bilaterally.  Respiratory:  Respirations regular and unlabored, clear to auscultation bilaterally. GI: Soft, nontender, nondistended, BS + x 4. MS: no deformity or atrophy. Skin: warm and dry, no rash. Neuro:  Strength and sensation are intact. Psych: Normal affect.  EKG Interpretation Date/Time:  Thursday April 16 2023 15:15:10 EDT Ventricular Rate:  55 PR  Interval:  166 QRS Duration:  90 QT Interval:  506 QTC Calculation: 484 R Axis:   63  Text Interpretation: Sinus bradycardia with sinus arrhythmia When compared with ECG of 02-Apr-2023 12:12, No significant change was found Confirmed by  Joni Reining ([id number]) on 04/16/2023 5:15:14 PM   Lab Results  Component Value Date   WBC 6.4 04/02/2023   HGB 12.5 04/02/2023   HCT 38.6 04/02/2023   MCV 103.8 (H) 04/02/2023   PLT 240 04/02/2023   Lab Results  Component Value Date   CREATININE 1.24 (H) 04/02/2023   BUN 34 (H) 04/02/2023   NA 135 04/02/2023   K 3.7 04/02/2023   CL 99 04/02/2023   CO2 28 04/02/2023   Lab Results  Component Value Date   ALT 37 03/14/2023   AST 41 03/14/2023   ALKPHOS 145 (H) 03/14/2023   BILITOT 0.7 03/14/2023   Lab Results  Component Value Date   CHOL 135 03/14/2023   HDL 36 (L) 03/14/2023   LDLCALC 82 03/14/2023   TRIG 86 03/14/2023   CHOLHDL 3.8 03/14/2023    No results found for: "HGBA1C"   Review of Prior Studies EKG Interpretation Date/Time:  Thursday April 16 2023 15:15:10 EDT Ventricular Rate:  55 PR Interval:  166 QRS Duration:  90 QT Interval:  506 QTC Calculation: 484 R Axis:   63  Text Interpretation: Sinus bradycardia with sinus arrhythmia When compared with ECG of 02-Apr-2023 12:12, No significant change was found Confirmed by Joni Reining [phone]) on 04/16/2023 5:15:14 PM RHC & LHC 03/17/23:  Prox RCA lesion is 100% stenosed.  Dist Cx lesion is 100% stenosed. -, RPDA lesion is 55% stenosed.   Prox LAD lesion is 60% stenosed.  Mid LAD lesion is 70% stenosed.   LIMA graft was visualized by angiography.   SVG-OM3 graft was visualized by angiography and is normal in caliber.  The graft exhibits no disease.   SVG-rPDA  graft was visualized by angiography and is large.   Hemodynamic findings consistent with moderate pulmonary hypertension.   There is no aortic valve stenosis.   Recommend to resume IV Heparin, at currently  prescribed dose and frequency on 03/17/2023.   2 Hr after TR Band off.   Severe combined CHF with severely elevated right-sided pressures: RAP mean 24 mmHg with large mild wave.; RV P-EDP 57/8-17 mmHg. PAP-mean 6/37-42 mmHg; PCWP 35 mmHg with V wave up to 43. Significant respiratory variation. LV P-EDP 118/16-22 mmHg, AO P-MAP 106/73-79 mmHg Ao sat 96, PA sat 54%. CO-CI (Fick) 4.51-2.35; PAPI 0.86 Significant native vessel CAD with 3 of 3 patent grafts: 100% proximal RCA CTO; 100% mLCx; Prox LAD 60% & mLAD ~70% (competetive flow from LIMA-LAD) Patent LIMA-LAD, SVG-PDA, SVG-OM  Echocardiogram 03/18/2023  1. Left ventricular ejection fraction, by estimation, is 35 to 40%. The  left ventricle has moderately decreased function. The left ventricle  demonstrates global hypokinesis. There is mild left ventricular  hypertrophy.   2. Right ventricular systolic function is moderately reduced. The right  ventricular size is normal. There is moderately elevated pulmonary artery  systolic pressure. The estimated right ventricular systolic pressure is  52.2 mmHg.   3. The mitral valve is normal in structure. Mild mitral valve  regurgitation.   4. Tricuspid valve regurgitation is moderate.   5. The aortic valve is tricuspid. Aortic valve regurgitation is not  visualized. Aortic valve sclerosis is present, with no evidence of aortic  valve stenosis.   6. The inferior vena cava is dilated in size with <50% respiratory  variability, suggesting right atrial pressure of 15 mmHg.   Assessment & Plan   1.  Coronary artery disease: Status post three-vessel CABG in 2021, status post cardiac catheterization (both right and left) revealing  303 grafts patent.  Continue secondary management with blood pressure control, statin therapy, low-cholesterol low-sodium diet.  2.  Combined CHF: Recent hospitalization for decompensated CHF and acute hypoxic respiratory failure.  She was diuresed.  EF during hospitalization 35  to 40% with global hypokinesis.  Had right and left heart catheterization with severely elevated right sided pressures.  She continues on low-sodium diet, diuretics, and is maintaining her weight.  She is being followed by advanced heart failure team Dr. Shirlee Latch for pulmonary hypertension as well as CHF.  3.  Paroxysmal atrial fibrillation: She is status post successful DCCV on 6 03/20/2023.  EKG today reveals sinus bradycardia with occasional PACs.  On follow-up with advanced heart failure clinic, Beaulah Dinning nurse practitioner, amiodarone was reduced to 100 mg daily, and she was restarted on metoprolol 12.5 mg at at bedtime. She is feeling well denies any palpitations, racing heart rate.  Will continue current medication regimen.  Continue Eliquis 5 mg twice daily.  4.  PAD: History of severe RAS, occlusion of celiac origin and high-grade stenosis of inferior mesenteric artery and common iliac artery.  Continue statin therapy.  Maintenance of blood pressure.  5.  Hypertension: Blood pressure is well-controlled today.  Continue spironolactone 12.5 mg daily and metoprolol 12.5 mg at at bedtime.       Signed, Bettey Mare. Liborio Nixon, ANP, AACC   04/16/2023 5:15 PM      Office [phone] Fax [phone]  Notice: This dictation was prepared with Dragon dictation along with smaller phrase technology. Any transcriptional errors that result from this process are unintentional and may not be corrected upon review.

## 2023-04-16 ENCOUNTER — Ambulatory Visit: Payer: Medicare HMO | Attending: Adult Health | Admitting: Adult Health

## 2023-04-16 ENCOUNTER — Encounter: Payer: Self-pay | Admitting: Adult Health

## 2023-04-16 VITALS — BP 124/84 | HR 60 | Ht 66.0 in | Wt 162.8 lb

## 2023-04-16 DIAGNOSIS — I2510 Atherosclerotic heart disease of native coronary artery without angina pectoris: Secondary | ICD-10-CM | POA: Diagnosis not present

## 2023-04-16 DIAGNOSIS — I10 Essential (primary) hypertension: Secondary | ICD-10-CM | POA: Diagnosis not present

## 2023-04-16 DIAGNOSIS — I480 Paroxysmal atrial fibrillation: Secondary | ICD-10-CM | POA: Diagnosis not present

## 2023-04-16 DIAGNOSIS — I739 Peripheral vascular disease, unspecified: Secondary | ICD-10-CM | POA: Diagnosis not present

## 2023-04-16 DIAGNOSIS — I5042 Chronic combined systolic (congestive) and diastolic (congestive) heart failure: Secondary | ICD-10-CM

## 2023-04-16 MED ORDER — FUROSEMIDE 20 MG PO TABS: 60.0000 mg | ORAL_TABLET | Freq: Every day | ORAL | 2 refills | Status: DC

## 2023-04-16 MED ORDER — SPIRONOLACTONE 25 MG PO TABS: 12.5000 mg | ORAL_TABLET | Freq: Every day | ORAL | 3 refills | Status: DC

## 2023-04-16 MED ORDER — PRAVASTATIN SODIUM 20 MG PO TABS: 20.0000 mg | ORAL_TABLET | Freq: Every day | ORAL | 3 refills | Status: DC

## 2023-04-16 NOTE — Patient Instructions (Signed)
Medication Instructions:  No changes *If you need a refill on your cardiac medications before your next appointment, please call your pharmacy*   Lab Work: No Labs If you have labs (blood work) drawn today and your tests are completely normal, you will receive your results only by: MyChart Message (if you have MyChart) OR A paper copy in the mail If you have any lab test that is abnormal or we need to change your treatment, we will call you to review the results.   Testing/Procedures: No Testing   Follow-Up: At [facility], you and your health needs are our priority.  As part of our continuing mission to provide you with exceptional heart care, we have created designated Provider Care Teams.  These Care Teams include your primary Cardiologist (physician) and Advanced Practice Providers (APPs -  Physician Assistants and Nurse Practitioners) who all work together to provide you with the care you need, when you need it.  We recommend signing up for the patient portal called "MyChart".  Sign up information is provided on this After Visit Summary.  MyChart is used to connect with patients for Virtual Visits (Telemedicine).  Patients are able to view lab/test results, encounter notes, upcoming appointments, etc.  Non-urgent messages can be sent to your provider as well.   To learn more about what you can do with MyChart, go to [email or web address].    Your next appointment:   Keep Scheduled Appointment  Provider:   Olga Millers, MD

## 2023-04-20 ENCOUNTER — Telehealth: Payer: Self-pay | Admitting: Adult Health

## 2023-04-20 NOTE — Telephone Encounter (Signed)
  The patient would like to follow up with Joni Reining regarding her portable oxygen. She mentioned that she tried to speak with [employer] and was informed that they only have an order for a machine to check her pulse, but not for portable oxygen. She gave [employer]'s phone number  [phone]

## 2023-04-20 NOTE — Telephone Encounter (Signed)
Patient calling for update on portable oxygen. Please advise

## 2023-04-20 NOTE — Telephone Encounter (Signed)
Spoke with patient and I have refaxed order for portable oxygen tank.

## 2023-04-20 NOTE — Telephone Encounter (Signed)
Patient states she still has not received portable oxygen and its been a long time. She stated [employer] [facility] informed her they were short staff.  I tried to call [employer]. Left voicemail to return call.   She wanted to speak to a Production designer, theatre/television/film

## 2023-04-21 DIAGNOSIS — I4821 Permanent atrial fibrillation: Secondary | ICD-10-CM | POA: Diagnosis not present

## 2023-04-21 DIAGNOSIS — I130 Hypertensive heart and chronic kidney disease with heart failure and stage 1 through stage 4 chronic kidney disease, or unspecified chronic kidney disease: Secondary | ICD-10-CM | POA: Diagnosis not present

## 2023-04-21 DIAGNOSIS — I5023 Acute on chronic systolic (congestive) heart failure: Secondary | ICD-10-CM | POA: Diagnosis not present

## 2023-04-21 DIAGNOSIS — N1831 Chronic kidney disease, stage 3a: Secondary | ICD-10-CM | POA: Diagnosis not present

## 2023-04-21 DIAGNOSIS — I739 Peripheral vascular disease, unspecified: Secondary | ICD-10-CM | POA: Diagnosis not present

## 2023-04-21 DIAGNOSIS — J449 Chronic obstructive pulmonary disease, unspecified: Secondary | ICD-10-CM | POA: Diagnosis not present

## 2023-04-21 DIAGNOSIS — N179 Acute kidney failure, unspecified: Secondary | ICD-10-CM | POA: Diagnosis not present

## 2023-04-21 DIAGNOSIS — I701 Atherosclerosis of renal artery: Secondary | ICD-10-CM | POA: Diagnosis not present

## 2023-04-21 DIAGNOSIS — J9611 Chronic respiratory failure with hypoxia: Secondary | ICD-10-CM | POA: Diagnosis not present

## 2023-04-22 ENCOUNTER — Telehealth: Payer: Self-pay | Admitting: Cardiology

## 2023-04-22 NOTE — Telephone Encounter (Signed)
Patient is requesting to speak to Joni Reining directly regarding an oxygen order.

## 2023-04-22 NOTE — Telephone Encounter (Signed)
Please see previous encounter.   Patient is following up requesting to speak directly with Joni Reining regarding portable oxygen tank order.

## 2023-04-23 ENCOUNTER — Telehealth: Payer: Self-pay | Admitting: Internal Medicine

## 2023-04-23 NOTE — Telephone Encounter (Signed)
Addressed pt's concerns in phone note from 04/22/23.

## 2023-04-23 NOTE — Telephone Encounter (Signed)
Pt returning nurse's call. Please advise

## 2023-04-23 NOTE — Telephone Encounter (Signed)
Call to patient and LM to call office

## 2023-04-23 NOTE — Telephone Encounter (Signed)
Patient states she has been trying to get a POC unit. States [employer] does not carry what she needs. She is needing something she can carry around to her doctor appointments, and would like to use a different company for this. She is wanting [employer] POC is possible.

## 2023-04-23 NOTE — Telephone Encounter (Signed)
Spoke with Monica Zamora at [facility] office). A POC eval performed by an [facility] RT is required prior to them providing a smaller portable O2 tank. Monica Zamora states this ticket has now been opened and the Monica Zamora should be contacted to schedule this soon.  Spoke with Monica Zamora to provide update. Monica Zamora reports that she has gotten in contact with another company which will apparently charge her "more than $2000" for an [employer] POC unit. Monica Zamora states she cannot afford this. Advised Monica Zamora to wait for POC eval through [employer] since this is currently pending. Monica Zamora verbalizes agreement with plan and appreciation of call.

## 2023-04-23 NOTE — Telephone Encounter (Signed)
Patient states she has appointment with [facility] August 19

## 2023-04-24 DIAGNOSIS — I130 Hypertensive heart and chronic kidney disease with heart failure and stage 1 through stage 4 chronic kidney disease, or unspecified chronic kidney disease: Secondary | ICD-10-CM | POA: Diagnosis not present

## 2023-04-24 DIAGNOSIS — I739 Peripheral vascular disease, unspecified: Secondary | ICD-10-CM | POA: Diagnosis not present

## 2023-04-24 DIAGNOSIS — I5023 Acute on chronic systolic (congestive) heart failure: Secondary | ICD-10-CM | POA: Diagnosis not present

## 2023-04-24 DIAGNOSIS — J449 Chronic obstructive pulmonary disease, unspecified: Secondary | ICD-10-CM | POA: Diagnosis not present

## 2023-04-24 DIAGNOSIS — I701 Atherosclerosis of renal artery: Secondary | ICD-10-CM | POA: Diagnosis not present

## 2023-04-24 DIAGNOSIS — I4821 Permanent atrial fibrillation: Secondary | ICD-10-CM | POA: Diagnosis not present

## 2023-04-24 DIAGNOSIS — N179 Acute kidney failure, unspecified: Secondary | ICD-10-CM | POA: Diagnosis not present

## 2023-04-24 DIAGNOSIS — N1831 Chronic kidney disease, stage 3a: Secondary | ICD-10-CM | POA: Diagnosis not present

## 2023-04-24 DIAGNOSIS — J9611 Chronic respiratory failure with hypoxia: Secondary | ICD-10-CM | POA: Diagnosis not present

## 2023-04-25 DIAGNOSIS — I130 Hypertensive heart and chronic kidney disease with heart failure and stage 1 through stage 4 chronic kidney disease, or unspecified chronic kidney disease: Secondary | ICD-10-CM | POA: Diagnosis not present

## 2023-04-25 DIAGNOSIS — N179 Acute kidney failure, unspecified: Secondary | ICD-10-CM | POA: Diagnosis not present

## 2023-04-25 DIAGNOSIS — J9611 Chronic respiratory failure with hypoxia: Secondary | ICD-10-CM | POA: Diagnosis not present

## 2023-04-25 DIAGNOSIS — I4821 Permanent atrial fibrillation: Secondary | ICD-10-CM | POA: Diagnosis not present

## 2023-04-25 DIAGNOSIS — I739 Peripheral vascular disease, unspecified: Secondary | ICD-10-CM | POA: Diagnosis not present

## 2023-04-25 DIAGNOSIS — N1831 Chronic kidney disease, stage 3a: Secondary | ICD-10-CM | POA: Diagnosis not present

## 2023-04-25 DIAGNOSIS — I5023 Acute on chronic systolic (congestive) heart failure: Secondary | ICD-10-CM | POA: Diagnosis not present

## 2023-04-25 DIAGNOSIS — J449 Chronic obstructive pulmonary disease, unspecified: Secondary | ICD-10-CM | POA: Diagnosis not present

## 2023-04-25 DIAGNOSIS — I701 Atherosclerosis of renal artery: Secondary | ICD-10-CM | POA: Diagnosis not present

## 2023-04-28 ENCOUNTER — Other Ambulatory Visit (HOSPITAL_BASED_OUTPATIENT_CLINIC_OR_DEPARTMENT_OTHER): Payer: Self-pay | Admitting: Cardiology

## 2023-04-28 DIAGNOSIS — I701 Atherosclerosis of renal artery: Secondary | ICD-10-CM | POA: Diagnosis not present

## 2023-04-28 DIAGNOSIS — N1831 Chronic kidney disease, stage 3a: Secondary | ICD-10-CM | POA: Diagnosis not present

## 2023-04-28 DIAGNOSIS — N179 Acute kidney failure, unspecified: Secondary | ICD-10-CM | POA: Diagnosis not present

## 2023-04-28 DIAGNOSIS — I6523 Occlusion and stenosis of bilateral carotid arteries: Secondary | ICD-10-CM

## 2023-04-28 DIAGNOSIS — I4821 Permanent atrial fibrillation: Secondary | ICD-10-CM | POA: Diagnosis not present

## 2023-04-28 DIAGNOSIS — I5023 Acute on chronic systolic (congestive) heart failure: Secondary | ICD-10-CM | POA: Diagnosis not present

## 2023-04-28 DIAGNOSIS — J9611 Chronic respiratory failure with hypoxia: Secondary | ICD-10-CM | POA: Diagnosis not present

## 2023-04-28 DIAGNOSIS — I130 Hypertensive heart and chronic kidney disease with heart failure and stage 1 through stage 4 chronic kidney disease, or unspecified chronic kidney disease: Secondary | ICD-10-CM | POA: Diagnosis not present

## 2023-04-28 DIAGNOSIS — I739 Peripheral vascular disease, unspecified: Secondary | ICD-10-CM | POA: Diagnosis not present

## 2023-04-28 DIAGNOSIS — J449 Chronic obstructive pulmonary disease, unspecified: Secondary | ICD-10-CM | POA: Diagnosis not present

## 2023-05-04 DIAGNOSIS — N1831 Chronic kidney disease, stage 3a: Secondary | ICD-10-CM | POA: Diagnosis not present

## 2023-05-04 DIAGNOSIS — I4821 Permanent atrial fibrillation: Secondary | ICD-10-CM | POA: Diagnosis not present

## 2023-05-04 DIAGNOSIS — J449 Chronic obstructive pulmonary disease, unspecified: Secondary | ICD-10-CM | POA: Diagnosis not present

## 2023-05-04 DIAGNOSIS — N179 Acute kidney failure, unspecified: Secondary | ICD-10-CM | POA: Diagnosis not present

## 2023-05-04 DIAGNOSIS — I5023 Acute on chronic systolic (congestive) heart failure: Secondary | ICD-10-CM | POA: Diagnosis not present

## 2023-05-04 DIAGNOSIS — J9611 Chronic respiratory failure with hypoxia: Secondary | ICD-10-CM | POA: Diagnosis not present

## 2023-05-04 DIAGNOSIS — I739 Peripheral vascular disease, unspecified: Secondary | ICD-10-CM | POA: Diagnosis not present

## 2023-05-04 DIAGNOSIS — I701 Atherosclerosis of renal artery: Secondary | ICD-10-CM | POA: Diagnosis not present

## 2023-05-04 DIAGNOSIS — I130 Hypertensive heart and chronic kidney disease with heart failure and stage 1 through stage 4 chronic kidney disease, or unspecified chronic kidney disease: Secondary | ICD-10-CM | POA: Diagnosis not present

## 2023-05-07 DIAGNOSIS — I739 Peripheral vascular disease, unspecified: Secondary | ICD-10-CM | POA: Diagnosis not present

## 2023-05-07 DIAGNOSIS — N1831 Chronic kidney disease, stage 3a: Secondary | ICD-10-CM | POA: Diagnosis not present

## 2023-05-07 DIAGNOSIS — J9611 Chronic respiratory failure with hypoxia: Secondary | ICD-10-CM | POA: Diagnosis not present

## 2023-05-07 DIAGNOSIS — I4821 Permanent atrial fibrillation: Secondary | ICD-10-CM | POA: Diagnosis not present

## 2023-05-07 DIAGNOSIS — J449 Chronic obstructive pulmonary disease, unspecified: Secondary | ICD-10-CM | POA: Diagnosis not present

## 2023-05-07 DIAGNOSIS — I130 Hypertensive heart and chronic kidney disease with heart failure and stage 1 through stage 4 chronic kidney disease, or unspecified chronic kidney disease: Secondary | ICD-10-CM | POA: Diagnosis not present

## 2023-05-07 DIAGNOSIS — I701 Atherosclerosis of renal artery: Secondary | ICD-10-CM | POA: Diagnosis not present

## 2023-05-07 DIAGNOSIS — I5023 Acute on chronic systolic (congestive) heart failure: Secondary | ICD-10-CM | POA: Diagnosis not present

## 2023-05-07 DIAGNOSIS — N179 Acute kidney failure, unspecified: Secondary | ICD-10-CM | POA: Diagnosis not present

## 2023-05-11 DIAGNOSIS — I130 Hypertensive heart and chronic kidney disease with heart failure and stage 1 through stage 4 chronic kidney disease, or unspecified chronic kidney disease: Secondary | ICD-10-CM | POA: Diagnosis not present

## 2023-05-11 DIAGNOSIS — J9611 Chronic respiratory failure with hypoxia: Secondary | ICD-10-CM | POA: Diagnosis not present

## 2023-05-11 DIAGNOSIS — J449 Chronic obstructive pulmonary disease, unspecified: Secondary | ICD-10-CM | POA: Diagnosis not present

## 2023-05-11 DIAGNOSIS — I739 Peripheral vascular disease, unspecified: Secondary | ICD-10-CM | POA: Diagnosis not present

## 2023-05-11 DIAGNOSIS — N1831 Chronic kidney disease, stage 3a: Secondary | ICD-10-CM | POA: Diagnosis not present

## 2023-05-11 DIAGNOSIS — N179 Acute kidney failure, unspecified: Secondary | ICD-10-CM | POA: Diagnosis not present

## 2023-05-11 DIAGNOSIS — I5023 Acute on chronic systolic (congestive) heart failure: Secondary | ICD-10-CM | POA: Diagnosis not present

## 2023-05-11 DIAGNOSIS — I701 Atherosclerosis of renal artery: Secondary | ICD-10-CM | POA: Diagnosis not present

## 2023-05-11 DIAGNOSIS — I4821 Permanent atrial fibrillation: Secondary | ICD-10-CM | POA: Diagnosis not present

## 2023-05-13 DIAGNOSIS — J9611 Chronic respiratory failure with hypoxia: Secondary | ICD-10-CM | POA: Diagnosis not present

## 2023-05-13 DIAGNOSIS — N179 Acute kidney failure, unspecified: Secondary | ICD-10-CM | POA: Diagnosis not present

## 2023-05-13 DIAGNOSIS — I130 Hypertensive heart and chronic kidney disease with heart failure and stage 1 through stage 4 chronic kidney disease, or unspecified chronic kidney disease: Secondary | ICD-10-CM | POA: Diagnosis not present

## 2023-05-13 DIAGNOSIS — J449 Chronic obstructive pulmonary disease, unspecified: Secondary | ICD-10-CM | POA: Diagnosis not present

## 2023-05-13 DIAGNOSIS — I5023 Acute on chronic systolic (congestive) heart failure: Secondary | ICD-10-CM | POA: Diagnosis not present

## 2023-05-13 DIAGNOSIS — N1831 Chronic kidney disease, stage 3a: Secondary | ICD-10-CM | POA: Diagnosis not present

## 2023-05-13 DIAGNOSIS — I4821 Permanent atrial fibrillation: Secondary | ICD-10-CM | POA: Diagnosis not present

## 2023-05-13 DIAGNOSIS — I701 Atherosclerosis of renal artery: Secondary | ICD-10-CM | POA: Diagnosis not present

## 2023-05-13 DIAGNOSIS — I739 Peripheral vascular disease, unspecified: Secondary | ICD-10-CM | POA: Diagnosis not present

## 2023-05-15 DIAGNOSIS — G4700 Insomnia, unspecified: Secondary | ICD-10-CM | POA: Diagnosis not present

## 2023-05-15 DIAGNOSIS — F411 Generalized anxiety disorder: Secondary | ICD-10-CM | POA: Diagnosis not present

## 2023-05-20 ENCOUNTER — Ambulatory Visit (INDEPENDENT_AMBULATORY_CARE_PROVIDER_SITE_OTHER): Payer: Medicare HMO | Admitting: Internal Medicine

## 2023-05-20 ENCOUNTER — Encounter: Payer: Self-pay | Admitting: Internal Medicine

## 2023-05-20 ENCOUNTER — Other Ambulatory Visit: Payer: Self-pay | Admitting: Emergency Medicine

## 2023-05-20 VITALS — BP 120/68 | HR 57 | Ht 66.0 in | Wt 165.0 lb

## 2023-05-20 DIAGNOSIS — J9611 Chronic respiratory failure with hypoxia: Secondary | ICD-10-CM

## 2023-05-20 DIAGNOSIS — J439 Emphysema, unspecified: Secondary | ICD-10-CM | POA: Diagnosis not present

## 2023-05-20 DIAGNOSIS — J4489 Other specified chronic obstructive pulmonary disease: Secondary | ICD-10-CM

## 2023-05-20 DIAGNOSIS — Z87891 Personal history of nicotine dependence: Secondary | ICD-10-CM

## 2023-05-20 DIAGNOSIS — Z122 Encounter for screening for malignant neoplasm of respiratory organs: Secondary | ICD-10-CM

## 2023-05-20 LAB — PULMONARY FUNCTION TEST
DL/VA % pred: 89 %
DL/VA: 3.66 ml/min/mmHg/L
DLCO cor % pred: 43 %
DLCO cor: 8.65 ml/min/mmHg
DLCO unc % pred: 41 %
DLCO unc: 8.4 ml/min/mmHg
FEF 25-75 Post: 0.96 L/s
FEF 25-75 Pre: 0.57 L/s
FEF2575-%Change-Post: 68 %
FEF2575-%Pred-Post: 50 %
FEF2575-%Pred-Pre: 29 %
FEV1-%Change-Post: 21 %
FEV1-%Pred-Post: 43 %
FEV1-%Pred-Pre: 36 %
FEV1-Post: 1.02 L
FEV1-Pre: 0.84 L
FEV1FVC-%Change-Post: 3 %
FEV1FVC-%Pred-Pre: 88 %
FEV6-%Change-Post: 16 %
FEV6-%Pred-Post: 50 %
FEV6-%Pred-Pre: 42 %
FEV6-Post: 1.47 L
FEV6-Pre: 1.26 L
FEV6FVC-%Change-Post: 0 %
FEV6FVC-%Pred-Post: 104 %
FEV6FVC-%Pred-Pre: 104 %
FVC-%Change-Post: 16 %
FVC-%Pred-Post: 48 %
FVC-%Pred-Pre: 41 %
FVC-Post: 1.48 L
FVC-Pre: 1.27 L
Post FEV1/FVC ratio: 69 %
Post FEV6/FVC ratio: 100 %
Pre FEV1/FVC ratio: 67 %
Pre FEV6/FVC Ratio: 100 %
RV % pred: 366 %
RV: 8.28 L
TLC % pred: 182 %
TLC: 9.49 L

## 2023-05-20 NOTE — Patient Instructions (Addendum)
It was a pleasure to see you today!  Please schedule follow up scheduled with myself in 3 months.  If my schedule is not open yet, we will contact you with a reminder closer to that time. Please call [phone] if you haven't heard from [place] a month before, and always call us sooner if issues or concerns arise. You can also send [place] a message through MyChart, but but aware that this is not to be used for urgent issues and it may take up to 5-7 days to receive a reply.   Continue the albuterol inhaler as needed  Let me know if you want to try a different maintenance inhaler besides the breztri.   We will follow up about the portable oxygen - I have ordered it.   Please call the lung cancer screening team to schedule your appointment and CT scan.   Giving you your flu shot today. Please get covid and RSV vaccines this fall.

## 2023-05-20 NOTE — Progress Notes (Signed)
Monica Zamora    [id number]    [birth year]  Primary Care Physician:Hemberg, Ruby Cola, NP Date of Appointment: 05/20/2023 Established Patient Visit  Chief complaint:   Chief Complaint  Patient presents with   Follow-up    Pft f/u, wants to qualify for POC      HPI: Monica Zamora is a 72 y.o. woman with past medical history of COPD  Interval Updates: Here for follow up after PFTS.  She stopped the breztri because she said it didn't suit her heart. She wants to just say on albuterol as needed. She is using it 2-3 times/week. She does not want to try any other kind of maintenance inhaler.   She was hospitalized for heart failure exacerbation, atrial fibrillation. Had TEE cardioversion, switched to amiodarone due to bradycardia from BB. She is on eliquis.        I have reviewed the patient's family social and past medical history and updated as appropriate.   Past Medical History:  Diagnosis Date   A-fib [facility])    Carotid artery disease (HCC)    COPD (chronic obstructive pulmonary disease) (HCC)    Coronary artery disease    Former tobacco use    Heart failure with mildly reduced ejection fraction (HFmrEF) (HCC)    Hx of CABG    Hypertension    PUD (peptic ulcer disease)     Past Surgical History:  Procedure Laterality Date   ABDOMINAL HYSTERECTOMY     CARDIOVERSION N/A 03/20/2023   Procedure: CARDIOVERSION;  Surgeon: Laurey Morale, MD;  Location: [facility] INVASIVE CV LAB;  Service: Cardiovascular;  Laterality: N/A;   CHOLECYSTECTOMY     CORONARY ARTERY BYPASS GRAFT     RIGHT/LEFT HEART CATH AND CORONARY/GRAFT ANGIOGRAPHY N/A 03/17/2023   Procedure: RIGHT/LEFT HEART CATH AND CORONARY/GRAFT ANGIOGRAPHY;  Surgeon: Marykay Lex, MD;  Location: [facility] INVASIVE CV LAB;  Service: Cardiovascular;  Laterality: N/A;   ruptured disc     SPINE SURGERY     TEE WITHOUT CARDIOVERSION N/A 03/20/2023   Procedure: TRANSESOPHAGEAL ECHOCARDIOGRAM;  Surgeon: Laurey Morale, MD;  Location: [facility] INVASIVE CV LAB;  Service: Cardiovascular;  Laterality: N/A;    Family History  Problem Relation Age of Onset   Lupus Mother    Lung disease Neg Hx     Social History   Occupational History   Occupation: Retired  Tobacco Use   Smoking status: Former    Current packs/day: 0.00    Average packs/day: 1.5 packs/day for 54.9 years (82.3 ttl pk-yrs)    Types: Cigarettes    Start date: 09/16/1967    Quit date: 08/05/2022    Years since quitting: 0.7   Smokeless tobacco: Never  Vaping Use   Vaping status: Never Used  Substance and Sexual Activity   Alcohol use: Not Currently   Drug use: Never   Sexual activity: Not on file     Physical Exam: Blood pressure 120/68, pulse (!) 57, height 5\' 6"  (1.676 m), weight 165 lb (74.8 kg), SpO2 94%.  Gen:      No acute distress ENT:  no nasal polyps, mucus membranes moist Lungs:    mdiline sternotomy incision well-healed CDI No increased respiratory effort, symmetric chest wall excursion, clear to auscultation bilaterally, no wheezes or crackles CV:         Regular rate and rhythm; no murmurs, rubs, or gallops.  No pedal edema   Data Reviewed: Imaging: I have personally  reviewed the ct angio chest June 2024 - visualized lung windows without any obvious nodules or masses.   PFTs:     Latest Ref Rng & Units 05/20/2023   11:26 AM  PFT Results  FVC-Pre L 1.27  P  FVC-Predicted Pre % 41  P  FVC-Post L 1.48  P  FVC-Predicted Post % 48  P  Pre FEV1/FVC % % 67  P  Post FEV1/FCV % % 69  P  FEV1-Pre L 0.84  P  FEV1-Predicted Pre % 36  P  FEV1-Post L 1.02  P  DLCO uncorrected ml/min/mmHg 8.40  P  DLCO UNC% % 41  P  DLCO corrected ml/min/mmHg 8.65  P  DLCO COR %Predicted % 43  P  DLVA Predicted % 89  P  TLC L 9.49  P  TLC % Predicted % 182  P  RV % Predicted % 366  P    P Preliminary result   I have personally reviewed the patient's PFTs and severe airflow limitation with hyperinflation and air  trapping  Labs: Lab Results  Component Value Date   NA 135 04/02/2023   K 3.7 04/02/2023   CO2 28 04/02/2023   GLUCOSE 133 (H) 04/02/2023   BUN 34 (H) 04/02/2023   CREATININE 1.24 (H) 04/02/2023   CALCIUM 8.9 04/02/2023   EGFR 51 (L) 01/27/2023   GFRNONAA 47 (L) 04/02/2023   Lab Results  Component Value Date   NA 135 04/02/2023   K 3.7 04/02/2023   CL 99 04/02/2023   CO2 28 04/02/2023    Immunization status: Immunization History  Administered Date(s) Administered   Fluad Quad(high Dose 65+) 08/11/2021   PFIZER(Purple Top)SARS-COV-2 Vaccination 12/23/2019, 02/28/2020   PNEUMOCOCCAL CONJUGATE-20 08/11/2021   Pneumococcal Polysaccharide-23 08/11/2021    External Records Personally Reviewed: hospital stay  Assessment:  Severe COPD FEV1 36% of predicted Chronic respiratory failure Need for influenza vaccination Need for lung cancer screening  Plan/Recommendations:  Continue the albuterol inhaler as needed   Let me know if you want to try a different maintenance inhaler besides the breztri.  She is firm she does not want another maintenance inhaler at this time.   We will follow up about the portable oxygen - I have ordered it.   Please call the lung cancer screening team to schedule your appointment and CT scan.   Giving you your flu shot today. Please get covid and RSV vaccines this fall.   Return to Care: Return in about 3 months (around 08/19/2023).   Durel Salts, MD Pulmonary and Critical Care Medicine [facility] Office:[phone]

## 2023-05-20 NOTE — Progress Notes (Signed)
Full PFT performed today. °

## 2023-05-20 NOTE — Patient Instructions (Signed)
Full PFT performed today. °

## 2023-05-21 DIAGNOSIS — I130 Hypertensive heart and chronic kidney disease with heart failure and stage 1 through stage 4 chronic kidney disease, or unspecified chronic kidney disease: Secondary | ICD-10-CM | POA: Diagnosis not present

## 2023-05-21 DIAGNOSIS — J9611 Chronic respiratory failure with hypoxia: Secondary | ICD-10-CM | POA: Diagnosis not present

## 2023-05-21 DIAGNOSIS — J449 Chronic obstructive pulmonary disease, unspecified: Secondary | ICD-10-CM | POA: Diagnosis not present

## 2023-05-21 DIAGNOSIS — N1831 Chronic kidney disease, stage 3a: Secondary | ICD-10-CM | POA: Diagnosis not present

## 2023-05-21 DIAGNOSIS — I701 Atherosclerosis of renal artery: Secondary | ICD-10-CM | POA: Diagnosis not present

## 2023-05-21 DIAGNOSIS — I5023 Acute on chronic systolic (congestive) heart failure: Secondary | ICD-10-CM | POA: Diagnosis not present

## 2023-05-21 DIAGNOSIS — N179 Acute kidney failure, unspecified: Secondary | ICD-10-CM | POA: Diagnosis not present

## 2023-05-21 DIAGNOSIS — I4821 Permanent atrial fibrillation: Secondary | ICD-10-CM | POA: Diagnosis not present

## 2023-05-21 DIAGNOSIS — I739 Peripheral vascular disease, unspecified: Secondary | ICD-10-CM | POA: Diagnosis not present

## 2023-05-22 DIAGNOSIS — J449 Chronic obstructive pulmonary disease, unspecified: Secondary | ICD-10-CM | POA: Diagnosis not present

## 2023-05-22 DIAGNOSIS — I130 Hypertensive heart and chronic kidney disease with heart failure and stage 1 through stage 4 chronic kidney disease, or unspecified chronic kidney disease: Secondary | ICD-10-CM | POA: Diagnosis not present

## 2023-05-22 DIAGNOSIS — I701 Atherosclerosis of renal artery: Secondary | ICD-10-CM | POA: Diagnosis not present

## 2023-05-22 DIAGNOSIS — I5023 Acute on chronic systolic (congestive) heart failure: Secondary | ICD-10-CM | POA: Diagnosis not present

## 2023-05-22 DIAGNOSIS — I739 Peripheral vascular disease, unspecified: Secondary | ICD-10-CM | POA: Diagnosis not present

## 2023-05-22 DIAGNOSIS — I4821 Permanent atrial fibrillation: Secondary | ICD-10-CM | POA: Diagnosis not present

## 2023-05-22 DIAGNOSIS — N179 Acute kidney failure, unspecified: Secondary | ICD-10-CM | POA: Diagnosis not present

## 2023-05-22 DIAGNOSIS — J9611 Chronic respiratory failure with hypoxia: Secondary | ICD-10-CM | POA: Diagnosis not present

## 2023-05-22 DIAGNOSIS — N1831 Chronic kidney disease, stage 3a: Secondary | ICD-10-CM | POA: Diagnosis not present

## 2023-05-25 ENCOUNTER — Encounter: Payer: Self-pay | Admitting: Acute Care

## 2023-06-05 ENCOUNTER — Other Ambulatory Visit (HOSPITAL_COMMUNITY): Payer: Self-pay

## 2023-06-05 DIAGNOSIS — I5022 Chronic systolic (congestive) heart failure: Secondary | ICD-10-CM

## 2023-06-05 MED ORDER — AMIODARONE HCL 100 MG PO TABS
100.0000 mg | ORAL_TABLET | Freq: Every day | ORAL | 11 refills | Status: DC
Start: 2023-06-05 — End: 2023-08-06

## 2023-06-17 ENCOUNTER — Telehealth: Payer: Self-pay | Admitting: Nurse Practitioner

## 2023-06-17 ENCOUNTER — Ambulatory Visit (INDEPENDENT_AMBULATORY_CARE_PROVIDER_SITE_OTHER): Payer: Medicare HMO | Admitting: Nurse Practitioner

## 2023-06-17 DIAGNOSIS — Z87891 Personal history of nicotine dependence: Secondary | ICD-10-CM

## 2023-06-17 NOTE — Progress Notes (Signed)
Virtual Visit via Telephone Note  I connected with Monica Zamora on 06/17/23 at 12:30 PM EDT by telephone and verified that I am speaking with the correct person using two identifiers.  Location:  Remote Patient: Monica Zamora Provider: Posey Boyer    Shared Decision Making Visit Lung Cancer Screening Program [phone])   Eligibility: Age 72 y.o. Pack Years Smoking History Calculation 82.5 (# packs/per year x # years smoked) Recent History of coughing up blood  no Unexplained weight loss? no ( >Than 15 pounds within the last 6 months ) Prior History Lung / other cancer no (Diagnosis within the last 5 years already requiring surveillance chest CT Scans). Smoking Status Former Smoker Former Smokers: Years since quit: 2 years  Quit Date: 2022  Visit Components: Discussion included one or more decision making aids. yes Discussion included risk/benefits of screening. yes Discussion included potential follow up diagnostic testing for abnormal scans. yes Discussion included meaning and risk of over diagnosis. yes Discussion included meaning and risk of False Positives. yes Discussion included meaning of total radiation exposure. yes  Counseling Included: Importance of adherence to annual lung cancer LDCT screening. yes Impact of comorbidities on ability to participate in the program. yes Ability and willingness to under diagnostic treatment. yes  Smoking Cessation Counseling:  Former Smokers:  Discussed the importance of maintaining cigarette abstinence. yes Diagnosis Code: Personal History of Nicotine Dependence. W29.562 Information about tobacco cessation classes and interventions provided to patient. Yes Patient provided with "ticket" for LDCT Scan. N/a Written Order for Lung Cancer Screening with LDCT placed in Epic.  (CT Chest Lung Cancer Screening Low Dose W/O CM) [id number] Z12.2-Screening of respiratory organs Z87.891-Personal history of nicotine  dependence   Posey Boyer, NP

## 2023-06-17 NOTE — Patient Instructions (Signed)

## 2023-06-18 ENCOUNTER — Ambulatory Visit
Admission: RE | Admit: 2023-06-18 | Discharge: 2023-06-18 | Disposition: A | Discharge status: Home / Self Care | Payer: Medicare HMO | Source: Ambulatory Visit | Attending: Acute Care | Admitting: Acute Care

## 2023-06-18 DIAGNOSIS — J439 Emphysema, unspecified: Secondary | ICD-10-CM | POA: Diagnosis not present

## 2023-06-18 DIAGNOSIS — Z122 Encounter for screening for malignant neoplasm of respiratory organs: Secondary | ICD-10-CM

## 2023-06-18 DIAGNOSIS — Z87891 Personal history of nicotine dependence: Secondary | ICD-10-CM | POA: Diagnosis not present

## 2023-06-18 DIAGNOSIS — I700 Atherosclerosis of aorta: Secondary | ICD-10-CM | POA: Diagnosis not present

## 2023-06-18 NOTE — Telephone Encounter (Signed)
Pt was reached and SDMV was completed.

## 2023-06-25 ENCOUNTER — Telehealth: Payer: Self-pay | Admitting: Acute Care

## 2023-06-25 NOTE — Telephone Encounter (Signed)
Returned call to patient requesting results of LDCT.  Advised results are taking about a week to 10 days to receive.  Will notify patient as soon as results are available.  Patient acknowledged understanding

## 2023-07-08 ENCOUNTER — Other Ambulatory Visit: Payer: Self-pay | Admitting: Acute Care

## 2023-07-08 DIAGNOSIS — Z122 Encounter for screening for malignant neoplasm of respiratory organs: Secondary | ICD-10-CM

## 2023-07-08 DIAGNOSIS — Z87891 Personal history of nicotine dependence: Secondary | ICD-10-CM

## 2023-07-10 ENCOUNTER — Telehealth: Payer: Self-pay | Admitting: Acute Care

## 2023-07-10 NOTE — Telephone Encounter (Signed)
Spoke with patient and reviewed results of recent LDCT.  LR1.  No suspicious findings for lung cancer.  Atherosclerosis and emphysema noted.  Plan to repeat LDCT in one year. Patient acknowledged understanding.

## 2023-07-24 NOTE — Progress Notes (Signed)
HPI: FU coronary artery disease, CHF, atrial fibrillation and hypertension.  Patient is status post coronary artery bypass and graft in 2021.  Carotid Dopplers June 2024 showed 60 to 79% right and 40 to 59% left stenosis.  Admitted with CHF July 2024.  Also noted to be in atrial fibrillation.  ABIs July 2024 moderate on the right and left.  Mesenteric Dopplers July 2024 showed 70 to 99% superior mesenteric artery, occluded celiac artery and high-grade stenosis at the origin of the IMA.  Cardiac catheterization July 2024 showed occluded RCA, occluded circumflex, 60 followed by 70% LAD, 55% PDA and patent LIMA to the LAD, saphenous vein graft to the OM 3 and saphenous vein graft to the PDA; pulmonary capillary wedge pressure 35.  Renal Dopplers July 2024 showed 1 to 59% bilateral stenosis.  Transesophageal echocardiogram July 2024 showed ejection fraction 30 to 35%, mild left ventricular enlargement, moderate RV dysfunction, mild right ventricular enlargement, left atrial appendage status post surgical clipping, mild left atrial enlargement, mild right atrial enlargement, moderate to severe tricuspid regurgitation, mild mitral regurgitation.  Patient underwent cardioversion and was also diuresed with improvement.  Placed on amiodarone.  Beta-blocker was discontinued due to bradycardia.  Since last seen patient denies increased dyspnea, chest pain, palpitations, syncope or pedal edema.  No bleeding.  Current Outpatient Medications  Medication Sig Dispense Refill   acetaminophen (TYLENOL) 500 MG tablet Take 1,000 mg by mouth 2 (two) times daily as needed (pain).     albuterol (VENTOLIN HFA) 108 (90 Base) MCG/ACT inhaler Inhale 1 puff into the lungs 2 (two) times daily as needed for wheezing or shortness of breath.     ALPRAZolam (XANAX) 0.5 MG tablet Take 1 tablet (0.5 mg total) by mouth at bedtime as needed for anxiety. 30 tablet 0   amiodarone (PACERONE) 100 MG tablet Take 1 tablet (100 mg total) by  mouth daily. 30 tablet 11   apixaban (ELIQUIS) 5 MG TABS tablet Take 1 tablet (5 mg total) by mouth 2 (two) times daily. 60 tablet 3   aspirin 81 MG chewable tablet Chew 81 mg by mouth daily.     levalbuterol (XOPENEX) 1.25 MG/0.5ML nebulizer solution Take 1.25 mg by nebulization every 4 (four) hours as needed for wheezing or shortness of breath. 1 each 12   sertraline (ZOLOFT) 100 MG tablet Take 100 mg by mouth.     furosemide (LASIX) 20 MG tablet Take 3 tablets (60 mg total) by mouth daily. 270 tablet 2   metoprolol succinate (TOPROL-XL) 25 MG 24 hr tablet Take 0.5 tablets (12.5 mg total) by mouth at bedtime. Take with or immediately following a meal. 90 tablet 3   pravastatin (PRAVACHOL) 20 MG tablet Take 1 tablet (20 mg total) by mouth daily at 6 PM. 90 tablet 3   spironolactone (ALDACTONE) 25 MG tablet Take 0.5 tablets (12.5 mg total) by mouth daily. 90 tablet 3   No current facility-administered medications for this visit.     Past Medical History:  Diagnosis Date   A-fib [facility])    Carotid artery disease (HCC)    COPD (chronic obstructive pulmonary disease) (HCC)    Coronary artery disease    Former tobacco use    Heart failure with mildly reduced ejection fraction (HFmrEF) (HCC)    Hx of CABG    Hypertension    PUD (peptic ulcer disease)     Past Surgical History:  Procedure Laterality Date   ABDOMINAL HYSTERECTOMY     CARDIOVERSION  N/A 03/20/2023   Procedure: CARDIOVERSION;  Surgeon: Laurey Morale, MD;  Location: [facility] INVASIVE CV LAB;  Service: Cardiovascular;  Laterality: N/A;   CHOLECYSTECTOMY     CORONARY ARTERY BYPASS GRAFT     RIGHT/LEFT HEART CATH AND CORONARY/GRAFT ANGIOGRAPHY N/A 03/17/2023   Procedure: RIGHT/LEFT HEART CATH AND CORONARY/GRAFT ANGIOGRAPHY;  Surgeon: Marykay Lex, MD;  Location: [facility] INVASIVE CV LAB;  Service: Cardiovascular;  Laterality: N/A;   ruptured disc     SPINE SURGERY     TEE WITHOUT CARDIOVERSION N/A 03/20/2023   Procedure: TRANSESOPHAGEAL  ECHOCARDIOGRAM;  Surgeon: Laurey Morale, MD;  Location: [facility] INVASIVE CV LAB;  Service: Cardiovascular;  Laterality: N/A;    Social History   Socioeconomic History   Marital status: Divorced    Spouse name: Not on file   Number of children: 2   Years of education: Not on file   Highest education level: 11th grade  Occupational History   Occupation: Retired  Tobacco Use   Smoking status: Former    Current packs/day: 0.00    Average packs/day: 1.5 packs/day for 54.9 years (82.3 ttl pk-yrs)    Types: Cigarettes    Start date: 09/16/1967    Quit date: 08/05/2022    Years since quitting: 1.0   Smokeless tobacco: Never  Vaping Use   Vaping status: Never Used  Substance and Sexual Activity   Alcohol use: Not Currently   Drug use: Never   Sexual activity: Not on file  Other Topics Concern   Not on file  Social History Narrative   Not on file   Social Determinants of Health   Financial Resource Strain: Low Risk  (04/08/2023)   Received from [facility]   Overall Financial Resource Strain (CARDIA)    Difficulty of Paying Living Expenses: Not hard at all  Food Insecurity: No Food Insecurity (04/08/2023)   Received from [facility]   Hunger Vital Sign    Worried About Running Out of Food in the Last Year: Never true    Ran Out of Food in the Last Year: Never true  Transportation Needs: No Transportation Needs (04/08/2023)   Received from [facility] - Transportation    Lack of Transportation (Medical): No    Lack of Transportation (Non-Medical): No  Physical Activity: Unknown (05/15/2022)   [facility] from [facility]   Exercise Vital Sign    Days of Exercise per Week: 0 days    Minutes of Exercise per Session: Not on file  Stress: Stress Concern Present (05/15/2022)   Received from [facility] - Occupational Stress Questionnaire    Feeling of Stress : Very much  Social Connections:  Unknown (05/16/2023)   Received from [facility]   Social Network    Social Network: Not on file  Intimate Partner Violence: Unknown (05/16/2023)   Received from [facility]   HITS    Physically Hurt: Not on file    Insult or Talk Down To: Not on file    Threaten Physical Harm: Not on file    Scream or Curse: Not on file    Family History  Problem Relation Age of Onset   Lupus Mother    Lung disease Neg Hx     ROS: no fevers or chills, productive cough, hemoptysis, dysphasia, odynophagia, melena, hematochezia, dysuria, hematuria, rash, seizure activity, orthopnea, PND, pedal edema, claudication. Remaining systems are negative.  Physical Exam: Well-developed well-nourished in  no acute distress.  Skin is warm and dry.  HEENT is normal.  Neck is supple.  Chest with diminished breath sounds throughout. Cardiovascular exam is regular rate and rhythm.  Abdominal exam nontender or distended. No masses palpated. Extremities show no edema. neuro grossly intact   A/P  1 chronic systolic congestive heart failure-patient appears to be euvolemic.  Will continue present dose of Lasix, spironolactone.  She is not on Farxiga or Jardiance due to recurrent UTIs.  Also on Toprol 12.5 mg daily.  Creatinine has improved.  Will recheck today.  Will begin Entresto 24/26 twice daily.  Check potassium and renal function in 1 week.  Repeat echocardiogram in 6 weeks.  Note patient has a history of unilateral renal artery stenosis on prior CTA but follow-up renal Dopplers July 2024 not suggestive of of significant renal artery stenosis.  Follow renal function closely.  2 paroxysmal atrial fibrillation-patient is status post cardioversion.  She is on amiodarone but also has severe home O2 dependent COPD.  I will therefore discontinue amiodarone and follow-up for recurrent atrial fibrillation.  Continue apixaban.  If she develops recurrent atrial fibrillation Tikosyn may be an option.  She would not be a  candidate for class Ic agent given coronary artery disease.  3 coronary artery disease status post coronary bypass and graft-recent catheterization showed patent grafts.  Continue statin.  4 chronic stage III kidney disease-check potassium and renal function today and in 1 week as outlined above.  5 mesenteric artery stenosis/renal artery stenosis/peripheral vascular disease-continue statin.  6 severe home O2 dependent COPD  7 hypertension-patient's blood pressure is controlled.  Medication adjustment as outlined above.  8 hyperlipidemia-continue statin.  9 carotid artery disease-she will need follow-up carotid Dopplers June 2025.  Olga Millers, MD

## 2023-07-31 ENCOUNTER — Telehealth: Payer: Self-pay | Admitting: Cardiology

## 2023-07-31 NOTE — Telephone Encounter (Signed)
Pharmacy called to make office aware that they've faxed over a request for all active prescriptions to be sent over to them and to the Attention of Julian Hy (pharmacist). They need MD signature and directions since its going to be faxed.

## 2023-07-31 NOTE — Telephone Encounter (Signed)
Patient has an appointment 08/06/23 - MD is not in the office.   Routed to [employer] for follow up on this at time of appointment

## 2023-08-03 ENCOUNTER — Other Ambulatory Visit (HOSPITAL_COMMUNITY): Payer: Self-pay

## 2023-08-06 ENCOUNTER — Ambulatory Visit: Payer: Medicare HMO | Attending: Cardiology | Admitting: Cardiology

## 2023-08-06 ENCOUNTER — Encounter: Payer: Self-pay | Admitting: Cardiology

## 2023-08-06 VITALS — BP 132/68 | HR 56 | Ht 66.0 in | Wt 170.0 lb

## 2023-08-06 DIAGNOSIS — I480 Paroxysmal atrial fibrillation: Secondary | ICD-10-CM | POA: Diagnosis not present

## 2023-08-06 DIAGNOSIS — I10 Essential (primary) hypertension: Secondary | ICD-10-CM

## 2023-08-06 DIAGNOSIS — I2510 Atherosclerotic heart disease of native coronary artery without angina pectoris: Secondary | ICD-10-CM

## 2023-08-06 DIAGNOSIS — I5042 Chronic combined systolic (congestive) and diastolic (congestive) heart failure: Secondary | ICD-10-CM

## 2023-08-06 DIAGNOSIS — N1832 Chronic kidney disease, stage 3b: Secondary | ICD-10-CM | POA: Diagnosis not present

## 2023-08-06 MED ORDER — SACUBITRIL-VALSARTAN 24-26 MG PO TABS
1.0000 | ORAL_TABLET | Freq: Two times a day (BID) | ORAL | 0 refills | Status: DC
Start: 2023-08-06 — End: 2023-11-26

## 2023-08-06 MED ORDER — SACUBITRIL-VALSARTAN 24-26 MG PO TABS
1.0000 | ORAL_TABLET | Freq: Two times a day (BID) | ORAL | 11 refills | Status: DC
Start: 2023-08-06 — End: 2023-08-06

## 2023-08-06 MED ORDER — DAPAGLIFLOZIN PROPANEDIOL 10 MG PO TABS: 10.0000 mg | ORAL_TABLET | Freq: Every day | ORAL | 11 refills | Status: DC

## 2023-08-06 NOTE — Addendum Note (Signed)
Addended by: Freddi Starr on: 08/06/2023 04:21 PM   Modules accepted: Orders

## 2023-08-06 NOTE — Patient Instructions (Addendum)
Medication Instructions:   STOP AMIODARONE  START ENTRESTO 24/26 MG ONE TABLET TWICE DAILY  *If you need a refill on your cardiac medications before your next appointment, please call your pharmacy*   Lab Work:  Your physician recommends that you return for lab work TODAY  Your physician recommends that you return for lab work in: ONE WEEK-DO NOT NEED TO FAST  If you have labs (blood work) drawn today and your tests are completely normal, you will receive your results only by: MyChart Message (if you have MyChart) OR A paper copy in the mail If you have any lab test that is abnormal or we need to change your treatment, we will call you to review the results.   Testing/Procedures:  Your physician has requested that you have an echocardiogram. Echocardiography is a painless test that uses sound waves to create images of your heart. It provides your doctor with information about the size and shape of your heart and how well your heart's chambers and valves are working. This procedure takes approximately one hour. There are no restrictions for this procedure. Please do NOT wear cologne, perfume, aftershave, or lotions (deodorant is allowed). Please arrive 15 minutes prior to your appointment time.  Please note: We ask at that you not bring children with you during ultrasound (echo/ vascular) testing. Due to room size and safety concerns, children are not allowed in the ultrasound rooms during exams. Our front office staff cannot provide observation of children in our lobby area while testing is being conducted. An adult accompanying a patient to their appointment will only be allowed in the ultrasound room at the discretion of the ultrasound technician under special circumstances. We apologize for any inconvenience. [address]   Follow-Up: At [facility], you and your health needs are our priority.  As part of our continuing mission to provide you with exceptional  heart care, we have created designated Provider Care Teams.  These Care Teams include your primary Cardiologist (physician) and Advanced Practice Providers (APPs -  Physician Assistants and Nurse Practitioners) who all work together to provide you with the care you need, when you need it.  We recommend signing up for the patient portal called "MyChart".  Sign up information is provided on this After Visit Summary.  MyChart is used to connect with patients for Virtual Visits (Telemedicine).  Patients are able to view lab/test results, encounter notes, upcoming appointments, etc.  Non-urgent messages can be sent to your provider as well.   To learn more about what you can do with MyChart, go to [email or web address].    Your next appointment:   6 month(s)  Provider:   Olga Millers, MD

## 2023-08-07 NOTE — Telephone Encounter (Signed)
Spoke with pt, she is changing to [facility]. Left message for [facility] to re-fax the paperwork.

## 2023-08-10 ENCOUNTER — Telehealth: Payer: Self-pay | Admitting: Cardiology

## 2023-08-10 NOTE — Telephone Encounter (Signed)
Pt states that she would like to speak with nurse about [facility] . Please advise

## 2023-08-10 NOTE — Telephone Encounter (Signed)
*  STAT* If patient is at the pharmacy, call can be transferred to refill team.   1. Which medications need to be refilled? (please list name of each medication and dose if known) apixaban (ELIQUIS) 5 MG TABS tablet  2. Which pharmacy/location (including street and city if local pharmacy) is medication to be sent to? [facility] - [place], [place] - [address]  3. Do they need a 30 day or 90 day supply?   90 day supply  Patient states she ran out of medication yesterday.

## 2023-08-10 NOTE — Telephone Encounter (Signed)
Called and spoke to patient. She report she has already spoken to someone. No further assistance needed at this time.

## 2023-08-11 ENCOUNTER — Other Ambulatory Visit: Payer: Self-pay

## 2023-08-11 MED ORDER — APIXABAN 5 MG PO TABS: 5.0000 mg | ORAL_TABLET | Freq: Two times a day (BID) | ORAL | 3 refills | Status: DC

## 2023-08-11 NOTE — Telephone Encounter (Signed)
Prescription refill request for Eliquis received. Indication:AFIB Last office visit:11/24 Scr:1.24  7/24 Age: 72 Weight:77.1  kg  Prescription refilled

## 2023-08-12 ENCOUNTER — Other Ambulatory Visit: Payer: Self-pay

## 2023-08-12 ENCOUNTER — Other Ambulatory Visit (HOSPITAL_COMMUNITY): Payer: Self-pay

## 2023-08-12 DIAGNOSIS — I5042 Chronic combined systolic (congestive) and diastolic (congestive) heart failure: Secondary | ICD-10-CM

## 2023-08-12 DIAGNOSIS — I480 Paroxysmal atrial fibrillation: Secondary | ICD-10-CM

## 2023-08-12 MED ORDER — APIXABAN 5 MG PO TABS: 5.0000 mg | ORAL_TABLET | Freq: Two times a day (BID) | ORAL | 1 refills | Status: DC

## 2023-08-12 MED ORDER — METOPROLOL SUCCINATE ER 25 MG PO TB24
12.5000 mg | ORAL_TABLET | Freq: Every day | ORAL | 3 refills | Status: DC
Start: 2023-08-12 — End: 2024-02-25

## 2023-08-12 NOTE — Telephone Encounter (Signed)
Prescription refill request for Eliquis received. Indication: Afib  Last office visit: 08/06/23 ([place])  Scr: 1.24 (04/02/23) Age: 72 Weight: 77.1kg  Appropriate dose. Refill sent.

## 2023-08-19 ENCOUNTER — Ambulatory Visit: Payer: Medicare HMO | Admitting: Internal Medicine

## 2023-09-14 ENCOUNTER — Ambulatory Visit (HOSPITAL_COMMUNITY): Payer: Medicare HMO | Attending: Cardiology

## 2023-09-14 DIAGNOSIS — I5042 Chronic combined systolic (congestive) and diastolic (congestive) heart failure: Secondary | ICD-10-CM | POA: Diagnosis not present

## 2023-09-14 DIAGNOSIS — I428 Other cardiomyopathies: Secondary | ICD-10-CM

## 2023-09-14 LAB — ECHOCARDIOGRAM COMPLETE: S' Lateral: 3.48 cm

## 2023-09-21 ENCOUNTER — Encounter: Payer: Self-pay | Admitting: *Deleted

## 2023-11-19 ENCOUNTER — Telehealth: Payer: Self-pay | Admitting: Adult Health

## 2023-11-19 NOTE — Telephone Encounter (Signed)
 Pt c/o medication issue:  1. Name of Medication:   furosemide (LASIX) 20 MG tablet (Expired)    2. How are you currently taking this medication (dosage and times per day)? Pt is not taking it  3. Are you having a reaction (difficulty breathing--STAT)? No   4. What is your medication issue? Pt states PCP did labs and told her to stop taking it. Pt wants to makes sure this is ok. Please Advise.

## 2023-11-24 ENCOUNTER — Telehealth: Payer: Self-pay | Admitting: Cardiology

## 2023-11-24 NOTE — Telephone Encounter (Signed)
 Left message for pt to call back

## 2023-11-24 NOTE — Telephone Encounter (Signed)
 Pt c/o medication issue:  1. Name of Medication: furosemide (LASIX) 20 MG tablet (Expired)   2. How are you currently taking this medication (dosage and times per day)? Stopped taking  3. Are you having a reaction (difficulty breathing--STAT)? No   4. What is your medication issue? Pcp office took her off  furosemide (LASIX) 20 MG tablet (Expired) and added prednisone. [Trequesting cb to discuss

## 2023-11-25 NOTE — Telephone Encounter (Signed)
 Patient identification verified by 2 forms. Marilynn Rail, RN    Called and spoke to patient  Patient states:   -at last visit with NP Hemberg (PCP) Furosemide was discontinue   -she is taking spironolactone and Marcelline Deist   -wants Dr. Jens Som to be aware of Rx changes   -would like to know if she should continue prednisone  Informed patient:   -follow up with PCP office regarding prednisone use   -message sent to Dr. Jens Som regarding Furosemide and Sherryll Burger  Patient verbalized understanding, no questions at this time  2/21 PCP OV note:

## 2023-11-26 NOTE — Telephone Encounter (Signed)
Spoke with pt, Aware of dr crenshaw's recommendations.  Follow up scheduled  

## 2023-11-30 ENCOUNTER — Encounter: Payer: Self-pay | Admitting: Physician Assistant

## 2023-11-30 ENCOUNTER — Ambulatory Visit: Attending: Physician Assistant | Admitting: Physician Assistant

## 2023-11-30 VITALS — BP 136/66 | HR 64 | Ht 66.0 in | Wt 186.6 lb

## 2023-11-30 DIAGNOSIS — I25810 Atherosclerosis of coronary artery bypass graft(s) without angina pectoris: Secondary | ICD-10-CM

## 2023-11-30 DIAGNOSIS — E875 Hyperkalemia: Secondary | ICD-10-CM

## 2023-11-30 DIAGNOSIS — I480 Paroxysmal atrial fibrillation: Secondary | ICD-10-CM | POA: Diagnosis not present

## 2023-11-30 DIAGNOSIS — E785 Hyperlipidemia, unspecified: Secondary | ICD-10-CM

## 2023-11-30 DIAGNOSIS — I10 Essential (primary) hypertension: Secondary | ICD-10-CM | POA: Diagnosis not present

## 2023-11-30 DIAGNOSIS — I5042 Chronic combined systolic (congestive) and diastolic (congestive) heart failure: Secondary | ICD-10-CM | POA: Diagnosis not present

## 2023-11-30 DIAGNOSIS — M549 Dorsalgia, unspecified: Secondary | ICD-10-CM

## 2023-11-30 DIAGNOSIS — E876 Hypokalemia: Secondary | ICD-10-CM

## 2023-11-30 MED ORDER — PRAVASTATIN SODIUM 20 MG PO TABS: 20.0000 mg | ORAL_TABLET | Freq: Every day | ORAL | 3 refills | Status: DC

## 2023-11-30 MED ORDER — FUROSEMIDE 40 MG PO TABS: 40.0000 mg | ORAL_TABLET | ORAL | 3 refills | Status: DC | PRN

## 2023-11-30 NOTE — Progress Notes (Unsigned)
 Cardiology Office Note:  .   Date:  12/01/2023  ID:  Jacklynn Bue, DOB November 13, 1950, MRN [id number] PCP: Rebecka Apley, NP  [facility] Providers Cardiologist:  Olga Millers, MD     History of Present Illness: Marland Kitchen   Monica Zamora is a 73 y.o. female with past medical history of CAD s/p CABG 2021, carotid artery disease, CHF, atrial fibrillation and hypertension.  Carotid Doppler in June 2024 showed 60 to 79% right ICA disease, 40 to 59% left ICA disease.  He was admitted in July 2024 with CHF exacerbation, he was also noted to be in atrial fibrillation at the time.  ABI in July 2024 showed a moderate right and left disease.  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  Cardiac catheterization in July 2024 showed occluded RCA, occluded left circumflex, 60 followed by 70% LAD lesion, 55% PDA lesion and a patent LIMA-LAD, patent SVG to OM 3 and patent SVG to PDA.  Renal Doppler showed 1 to 59% bilateral renal artery stenosis.  Transesophageal echocardiogram in July 2024 showed EF 30 to 35%, mild LVH, moderate RV dysfunction, mild RV enlargement, left atrial appendage s/p surgical clipping, mild right atrial enlargement, mild right atrial enlargement, moderate to severe TR, mild MR.  Patient underwent cardioversion and was diuresed with improvement.  She was discharged on amiodarone. Patient was last seen by Dr. Jens Som in November 2024.  Since she has severe home O2 dependent COPD, therefore the decision was made to discontinue amiodarone.  She was continued on Eliquis.  It was felt that if she develop any recurrent atrial fibrillation in the future, Tikosyn may be a good option.  As for heart failure, she appears to be euvolemic on the last visit, she is not on Farxiga or Jardiance due to recurrent UTI.  6 weeks repeat echocardiogram was recommended.  Echocardiogram obtained on 09/14/2023 showed  EF 50 to 55%, no regional wall motion abnormality, low normal RV systolic function, no significant valve issue.  During the recent follow-up with PCP, Lasix was discontinued and she was left on spironolactone and [place].  She was not taking the Lasix or Entresto.  On review of her blood work, her potassium was 6.3, therefore she was instructed to discontinue spironolactone as well.  She presents today for follow-up and medication titration.   Patient presents today for follow-up.  In the recent month, she was taken off of Entresto, spironolactone and Lasix.  Her creatinine peaked at 2.2 in February and has since came down to 1.7.  She does not appear to be volume overloaded on physical exam.  I will change Lasix to 40 mg daily as needed for leg swelling.  For some reason she is also off of pravastatin 20 mg as well, she denies any recent myalgia, I asked her to restart the pravastatin.  She was recently seen by her PCP for acute torticollis and left-sided sciatica and was treated with prednisone.  She continued to have low back pain, I will obtain urinalysis to rule out possible urinary tract infection.  I will also obtain a basic metabolic panel to reassess the potassium level.  Hopefully once her potassium come down, we can rechallenge her with low-dose Entresto again given her history of cardiomyopathy.  I will keep her off of spironolactone and amiodarone.  I plan to see the patient back in 4 weeks for reassessment, at that  time I will consider restart Entresto at the lowest dose if her potassium is normal.  ROS:   She denies chest pain, palpitations, dyspnea, pnd, orthopnea, n, v, dizziness, syncope, edema, weight gain, or early satiety. All other systems reviewed and are otherwise negative except as noted above.    Studies Reviewed: Marland Kitchen   EKG Interpretation Date/Time:  Monday November 30 2023 15:51:08 EDT Ventricular Rate:  64 PR Interval:  142 QRS Duration:  82 QT Interval:  388 QTC  Calculation: 400 R Axis:   63  Text Interpretation: Normal sinus rhythm  Poor R wave progression in the anterior leads Confirmed by Azalee Course [phone]) on 11/30/2023 3:53:14 PM    Cardiac Studies & Procedures   ______________________________________________________________________________________________ CARDIAC CATHETERIZATION  CARDIAC CATHETERIZATION 03/17/2023  Narrative   Prox RCA lesion is 100% stenosed.  Dist Cx lesion is 100% stenosed. -, RPDA lesion is 55% stenosed.   Prox LAD lesion is 60% stenosed.  Mid LAD lesion is 70% stenosed.   LIMA graft was visualized by angiography.   SVG-OM3 graft was visualized by angiography and is normal in caliber.  The graft exhibits no disease.   SVG-rPDA  graft was visualized by angiography and is large.  The graft exhibits no disease.   Hemodynamic findings consistent with moderate pulmonary hypertension with Severe RV Failure.   There is no aortic valve stenosis.  POST-CATH FINDINGS Severe combined CHF with severely elevated right-sided pressures: RAP mean 24 mmHg with large mild wave.; RV P-EDP 57/8-17 mmHg. PAP-mean 6/37-42 mmHg; PCWP 35 mmHg with V wave up to 43. Significant respiratory variation. LV P-EDP 118/16-22 mmHg, AO P-MAP 106/73-79 mmHg Ao sat 96, PA sat 54%. CO-CI (Fick) 4.51-2.35; PAPI 0.86 Significant native vessel CAD with 3 of 3 patent grafts: 100% proximal RCA CTO; 100% mLCx; Prox LAD 60% & mLAD ~70% (competetive flow from LIMA-LAD) Patent LIMA-LAD, SVG-PDA, SVG-OM 3   RECOMMENDATIONS: Return patient to nursing unit for ongoing care with IV diuresis => Heart Failure Service Consulted based on Severe RV Failure She was given 80 mg IV Lasix in the Cath Lab With the potential for having other invasive procedures including PICC line, I have written to restart IV heparin 2 hours after sheath removal.  Would convert back to DOAC once invasive procedures are completed.  Of note, she was in A-fib RVR with rates in the 100 120 bpm  range.   Bryan Lemma, MD  Findings Coronary Findings Diagnostic  Dominance: Right  Left Main Vessel was injected. Vessel is normal in caliber.  Left Anterior Descending Vessel is normal in caliber. Vessel is angiographically normal. Prox LAD lesion is 60% stenosed. Mid LAD lesion is 70% stenosed.  First Diagonal Branch Vessel is small in size.  First Septal Branch Vessel is small in size.  Second Diagonal Branch Vessel is small in size.  Second Septal Branch Vessel is small in size.  Left Circumflex Vessel is normal in caliber. Dist Cx lesion is 100% stenosed.  First Obtuse Marginal Branch Vessel is small in size.  Second Obtuse Marginal Branch Vessel is small in size.  Third Obtuse Marginal Branch Vessel is moderate in size.  Right Coronary Artery Vessel was injected. Vessel is moderate in size. Prox RCA lesion is 100% stenosed.  Acute Marginal Branch Vessel is small in size.  Right Ventricular Branch Vessel is small in size.  Right Posterior Descending Artery RPDA lesion is 55% stenosed.  LIMA LIMA Graft To Dist LAD LIMA graft was visualized by angiography.  -dLAD  Saphenous Graft To 3rd Mrg SVG graft was visualized by angiography and is normal in caliber.  The graft exhibits no disease. OM3  Saphenous Graft To RPDA SVG graft was visualized by angiography and is large.  The graft exhibits no disease. SVG-PDA  Intervention  No interventions have been documented.     ECHOCARDIOGRAM  ECHOCARDIOGRAM COMPLETE 09/14/2023  Narrative ECHOCARDIOGRAM REPORT    Patient Name:   KEELIE ZEMANEK Date of Exam: 09/14/2023 Medical Rec #:  [id number]           Height:       66.0 in Accession #:    [id number]          Weight:       170.0 lb Date of Birth:  [birth year]           BSA:          1.866 m Patient Age:    72 years            BP:           132/68 mmHg Patient Gender: F                   HR:           64 bpm. Exam Location:  [address]  Procedure: 2D Echo, 3D Echo, Cardiac Doppler, Color Doppler and Strain Analysis  Indications:    Cardiomyopathy-Unspecified I42.9  History:        Patient has prior history of Echocardiogram examinations, most recent 03/20/2023. CHF, CAD, Prior CABG, COPD, Arrythmias:Atrial Fibrillation; Risk Factors:Former Smoker and Hypertension.  Sonographer:    Eulah Pont RDCS Referring Phys: [id number] BRIAN S CRENSHAW   Sonographer Comments: Global longitudinal strain was attempted. IMPRESSIONS   1. Left ventricular ejection fraction, by estimation, is 50 to 55%. The left ventricle has low normal function. The left ventricle has no regional wall motion abnormalities. Left ventricular diastolic parameters are indeterminate. GLS -12.5%. 2. Right ventricular systolic function is low normal. The right ventricular size is normal. There is normal pulmonary artery systolic pressure. 3. The mitral valve is normal in structure. No evidence of mitral valve regurgitation. No evidence of mitral stenosis. 4. The aortic valve is normal in structure. Aortic valve regurgitation is not visualized. No aortic stenosis is present. 5. The inferior vena cava is normal in size with greater than 50% respiratory variability, suggesting right atrial pressure of 3 mmHg.  FINDINGS Left Ventricle: Left ventricular ejection fraction, by estimation, is 50 to 55%. The left ventricle has low normal function. The left ventricle has no regional wall motion abnormalities. The left ventricular internal cavity size was normal in size. There is no left ventricular hypertrophy. Left ventricular diastolic parameters are indeterminate.  Right Ventricle: The right ventricular size is normal. No increase in right ventricular wall thickness. Right ventricular systolic function is low normal. There is normal pulmonary artery systolic pressure. The tricuspid regurgitant velocity is 2.13 m/s, and with an assumed right atrial pressure of 3  mmHg, the estimated right ventricular systolic pressure is 21.1 mmHg.  Left Atrium: Left atrial size was normal in size.  Right Atrium: Right atrial size was normal in size.  Pericardium: There is no evidence of pericardial effusion.  Mitral Valve: The mitral valve is normal in structure. No evidence of mitral valve regurgitation. No evidence of mitral valve stenosis.  Tricuspid Valve: The tricuspid valve is normal in structure. Tricuspid valve regurgitation is trivial. No evidence of tricuspid stenosis.  Aortic  Valve: The aortic valve is normal in structure. Aortic valve regurgitation is not visualized. No aortic stenosis is present.  Pulmonic Valve: The pulmonic valve was normal in structure. Pulmonic valve regurgitation is trivial. No evidence of pulmonic stenosis.  Aorta: The aortic root is normal in size and structure.  Venous: The inferior vena cava is normal in size with greater than 50% respiratory variability, suggesting right atrial pressure of 3 mmHg.  IAS/Shunts: No atrial level shunt detected by color flow Doppler.   LEFT VENTRICLE PLAX 2D LVIDd:         4.88 cm   Diastology LVIDs:         3.48 cm   LV e' medial:    6.73 cm/s LV PW:         1.02 cm   LV E/e' medial:  9.0 LV IVS:        1.12 cm   LV e' lateral:   10.70 cm/s LVOT diam:     1.90 cm   LV E/e' lateral: 5.6 LV SV:         43 LV SV Index:   23 LVOT Area:     2.84 cm  3D Volume EF: 3D EF:        48 % LV EDV:       111 ml LV ESV:       58 ml LV SV:        53 ml  RIGHT VENTRICLE RV S prime:     9.55 cm/s TAPSE (M-mode): 1.1 cm  LEFT ATRIUM             Index        RIGHT ATRIUM           Index LA diam:        3.70 cm 1.98 cm/m   RA Area:     12.40 cm LA Vol (A2C):   35.3 ml 18.91 ml/m  RA Volume:   26.00 ml  13.93 ml/m LA Vol (A4C):   31.4 ml 16.82 ml/m LA Biplane Vol: 34.4 ml 18.43 ml/m AORTIC VALVE LVOT Vmax:   72.20 cm/s LVOT Vmean:  46.000 cm/s LVOT VTI:    0.153 m  AORTA Ao Root  diam: 3.00 cm Ao Asc diam:  3.60 cm  MV E velocity: 60.40 cm/s  TRICUSPID VALVE MV A velocity: 57.80 cm/s  TR Peak grad:   18.1 mmHg MV E/A ratio:  1.04        TR Vmax:        213.00 cm/s  SHUNTS Systemic VTI:  0.15 m Systemic Diam: 1.90 cm  Aditya Sabharwal Electronically signed by Dorthula Nettles Signature Date/Time: 09/14/2023/5:13:56 PM    Final   TEE  ECHO TEE 03/20/2023  Narrative TRANSESOPHOGEAL ECHO REPORT    Patient Name:   SUMMER MCCOLGAN Date of Exam: 03/20/2023 Medical Rec #:  [id number]           Height:       66.0 in Accession #:    [id number]          Weight:       165.3 lb Date of Birth:  Oct 29, 1950           BSA:          1.844 m Patient Age:    72 years            BP:           108/70 mmHg Patient Gender: F  HR:           69 bpm. Exam Location:  Inpatient  Procedure: Transesophageal Echo, Color Doppler and Cardiac Doppler  Indications:     I48.91* Unspecified atrial fibrillation  History:         Patient has prior history of Echocardiogram examinations, most recent 03/18/2023. CAD, Prior CABG, COPD, Arrythmias:Atrial Fibrillation; Risk Factors:Hypertension and Dyslipidemia.  Sonographer:     Irving Burton Senior RDCS Referring Phys:  [phone] Benay Spice [facility] Diagnosing Phys: Wilfred Lacy  PROCEDURE: After discussion of the risks and benefits of a TEE, an informed consent was obtained from the patient. The transesophogeal probe was passed without difficulty through the esophogus of the patient. Sedation performed by different physician. The patient was monitored while under deep sedation. Anesthestetic sedation was provided intravenously by Anesthesiology: 108mg  of Propofol, 80mg  of Lidocaine. The patient developed no complications during the procedure. A successful direct current cardioversion was performed at 200 joules with 1 attempt.  IMPRESSIONS   1. Left ventricular ejection fraction, by estimation, is 30 to 35%. The left  ventricle has moderately decreased function. The left ventricle demonstrates global hypokinesis. The left ventricular internal cavity size was mildly dilated. 2. Right ventricular systolic function is moderately reduced. The right ventricular size is mildly enlarged. There is mildly elevated pulmonary artery systolic pressure. The estimated right ventricular systolic pressure is 42.0 mmHg. 3. No LA thrombus. No LA appendage was present (s/p surgical clipping). Left atrial size was mildly dilated. 4. No PFO or ASD by color doppler. 5. There was lipomatous atria septal hypertrophy. 6. Right atrial size was mildly dilated. 7. The tricuspid valve is abnormal. Tricuspid valve regurgitation is moderate to severe. 8. The mitral valve is normal in structure. Mild mitral valve regurgitation. No evidence of mitral stenosis. 9. The aortic valve is tricuspid. Aortic valve regurgitation is not visualized. No aortic stenosis is present. 10. The inferior vena cava is dilated in size with <50% respiratory variability, suggesting right atrial pressure of 15 mmHg. 11. Normal caliber thoracic aorta with grade 3 plaque in the descending thoracic aorta.  FINDINGS Left Ventricle: Left ventricular ejection fraction, by estimation, is 30 to 35%. The left ventricle has moderately decreased function. The left ventricle demonstrates global hypokinesis. The left ventricular internal cavity size was mildly dilated. There is no left ventricular hypertrophy.  Right Ventricle: The right ventricular size is mildly enlarged. No increase in right ventricular wall thickness. Right ventricular systolic function is moderately reduced. There is mildly elevated pulmonary artery systolic pressure. The tricuspid regurgitant velocity is 2.60 m/s, and with an assumed right atrial pressure of 15 mmHg, the estimated right ventricular systolic pressure is 42.0 mmHg.  Left Atrium: No LA thrombus. No LA appendage was present (s/p surgical  clipping). Left atrial size was mildly dilated. No left atrial/left atrial appendage thrombus was detected.  Right Atrium: Right atrial size was mildly dilated.  Pericardium: There is no evidence of pericardial effusion.  Mitral Valve: The mitral valve is normal in structure. Mild mitral valve regurgitation. No evidence of mitral valve stenosis.  Tricuspid Valve: The tricuspid valve is abnormal. Tricuspid valve regurgitation is moderate to severe.  Aortic Valve: The aortic valve is tricuspid. Aortic valve regurgitation is not visualized. No aortic stenosis is present.  Pulmonic Valve: The pulmonic valve was normal in structure. Pulmonic valve regurgitation is not visualized.  Aorta: The aortic root is normal in size and structure.  Venous: The inferior vena cava is dilated in size with less than 50% respiratory  variability, suggesting right atrial pressure of 15 mmHg.  IAS/Shunts: No PFO or ASD by color doppler.  Additional Comments: Spectral Doppler performed.  TRICUSPID VALVE TR Peak grad:   27.0 mmHg TR Vmax:        260.00 cm/s  Dalton McleanMD Electronically signed by Wilfred Lacy Signature Date/Time: 03/20/2023/6:24:37 PM    Final (Updated)        ______________________________________________________________________________________________      Risk Assessment/Calculations:    CHA2DS2-VASc Score = 5   This indicates a 7.2% annual risk of stroke. The patient's score is based upon: CHF History: 1 HTN History: 1 Diabetes History: 0 Stroke History: 0 Vascular Disease History: 1 Age Score: 1 Gender Score: 1            Physical Exam:   VS:  BP 136/66 (BP Location: Left Arm, Patient Position: Sitting, Cuff Size: Normal)   Pulse 64   Ht 5\' 6"  (1.676 m)   Wt 186 lb 9.6 oz (84.6 kg)   SpO2 97%   BMI 30.12 kg/m    Wt Readings from Last 3 Encounters:  11/30/23 186 lb 9.6 oz (84.6 kg)  08/06/23 170 lb (77.1 kg)  05/20/23 165 lb (74.8 kg)    GEN: Well  nourished, well developed in no acute distress NECK: No JVD; No carotid bruits CARDIAC: RRR, no murmurs, rubs, gallops RESPIRATORY:  Clear to auscultation without rales, wheezing or rhonchi  ABDOMEN: Soft, non-tender, non-distended EXTREMITIES:  No edema; No deformity   ASSESSMENT AND PLAN: .    CAD s/p CABG: Denies any chest pain. On ASA. Resume pravastatin.   Chronic systolic and diastolic CHF Ejection fraction improved to low normal range. Entresto and spironolactone discontinued due to hyperkalemia and renal concerns. Lasix discontinued but available for edema. - Change Lasix to 40 mg as needed for peripheral edema. - Consider rechallenging with low dose Entresto once potassium levels stabilize. - Do not restart spironolactone due to hyperkalemia risk.  Atrial Fibrillation Currently in normal sinus rhythm. Amiodarone discontinued due to history of COPD requiring PRN O2 at home. Continues on Eliquis. - Continue Eliquis for anticoagulation.  Hypertension: blood pressure mildly elevated after discontinuation of spironolactone and entresto, plan to rechallenge with entresto if K improve.  Dyslipidemia Pravastatin discontinued without reason. Coronary artery disease present, statin needed. - Restart pravastatin 20 mg daily.  Hyperkalemia Potassium elevated to 6.3, risk for cardiac arrhythmias. Likely due to furosemide discontinuation. Dietary potassium intake discussed. - Order BMET - Advise to avoid high-potassium foods such as spinach and broccoli. - Monitor potassium levels closely.  Back Pain New back pain, possible sciatica or renal issue. Left lower back pain with percussion - Order urinalysis to rule out urinary tract infection. - Order basic metabolic panel to assess kidney function.        Dispo: follow up in 4 weeks  Signed, Azalee Course, [place]

## 2023-11-30 NOTE — Patient Instructions (Addendum)
 Medication Instructions:  RESTART PRAVASTATIN 20 MG DAILY  START LASIX 40 MG AS NEEDED FOR LEG SWELLING  *If you need a refill on your cardiac medications before your next appointment, please call your pharmacy*   Lab Work: BMP AND URINALYSIS TODAY If you have labs (blood work) drawn today and your tests are completely normal, you will receive your results only by: MyChart Message (if you have MyChart) OR A paper copy in the mail If you have any lab test that is abnormal or we need to change your treatment, we will call you to review the results.   Testing/Procedures: NO TESTING   Follow-Up: At [facility], you and your health needs are our priority.  As part of our continuing mission to provide you with exceptional heart care, we have created designated Provider Care Teams.  These Care Teams include your primary Cardiologist (physician) and Advanced Practice Providers (APPs -  Physician Assistants and Nurse Practitioners) who all work together to provide you with the care you need, when you need it.  Your next appointment:   4 week(s)  Provider:   Azalee Course, PA    Other Instructions

## 2023-12-01 LAB — BASIC METABOLIC PANEL
BUN/Creatinine Ratio: 20 (ref 12–28)
BUN: 37 mg/dL — ABNORMAL HIGH (ref 8–27)
CO2: 19 mmol/L — ABNORMAL LOW (ref 20–29)
Calcium: 8.6 mg/dL — ABNORMAL LOW (ref 8.7–10.3)
Chloride: 105 mmol/L (ref 96–106)
Creatinine, Ser: 1.85 mg/dL — ABNORMAL HIGH (ref 0.57–1.00)
Glucose: 70 mg/dL (ref 70–99)
Potassium: 4.9 mmol/L (ref 3.5–5.2)
Sodium: 139 mmol/L (ref 134–144)
eGFR: 29 mL/min/{1.73_m2} — ABNORMAL LOW (ref >59)

## 2023-12-02 ENCOUNTER — Other Ambulatory Visit: Payer: Self-pay

## 2023-12-02 DIAGNOSIS — E875 Hyperkalemia: Secondary | ICD-10-CM

## 2023-12-02 DIAGNOSIS — M549 Dorsalgia, unspecified: Secondary | ICD-10-CM

## 2023-12-03 ENCOUNTER — Other Ambulatory Visit: Payer: Self-pay | Admitting: Physician Assistant

## 2023-12-03 ENCOUNTER — Telehealth: Payer: Self-pay | Admitting: Cardiology

## 2023-12-03 LAB — URINALYSIS, ROUTINE W REFLEX MICROSCOPIC
Bilirubin, UA: NEGATIVE
Glucose, UA: NEGATIVE
Ketones, UA: NEGATIVE
Nitrite, UA: POSITIVE — AB
Protein,UA: NEGATIVE
RBC, UA: NEGATIVE
Specific Gravity, UA: 1.015 (ref 1.005–1.030)
Urobilinogen, Ur: 0.2 mg/dL (ref 0.2–1.0)
pH, UA: 5.5 (ref 5.0–7.5)

## 2023-12-03 LAB — MICROSCOPIC EXAMINATION
Casts: NONE SEEN /LPF
RBC, Urine: NONE SEEN /HPF (ref 0–2)

## 2023-12-03 MED ORDER — SULFAMETHOXAZOLE-TRIMETHOPRIM 800-160 MG PO TABS: 1.0000 | ORAL_TABLET | Freq: Two times a day (BID) | ORAL | 0 refills | Status: DC

## 2023-12-03 NOTE — Telephone Encounter (Signed)
 Noted  Appreciate below  No further nursing outreach needed at this time

## 2023-12-03 NOTE — Telephone Encounter (Signed)
 Patient identification verified by 2 forms. Marilynn Rail, RN    Called and spoke to patient  Patient states:   -Saw PA [place] on 3/17 of kidney pain   -continues to have pain in right kidney   -pain is constantly there   -it is greater than 10/10 pain  -kidney pain has worsened since Monday   -started lasix 40mg  on Monday, initially helped with pain   -she is not have swelling   -she is drinking lots of fluid to stay hydrated   -she is on 2L nasal canula  Patient denies:   -fever/chills   -painful/difficulty urinating  Informed patient message sent to PA [facility] and Dr. Jens Som for input  Patient verbalized understanding, no questions at this time

## 2023-12-03 NOTE — Progress Notes (Signed)
 Patient has urinary tract infection, I have sent a Rx of bactrim to her pharmacy after discussing with our clinical pharmacist.

## 2023-12-03 NOTE — Telephone Encounter (Signed)
 I called and spoke with the patient. Her UA is consistent with a urinary tract infection. BMET also shows renal function worsened slightly. She has been instructed to change lasix to PRN. I am reaching out to our clinical pharmacist to discussion antibiotics option for her UTI. She is aware that if her back pain does not improve with a course of abx, she will need to go to ED.

## 2023-12-03 NOTE — Telephone Encounter (Signed)
 Hi. I think Dr. Jens Som and I responded right after one another. Before he made the comment, I already discussed with Laural Golden our clinical pharmacist and sent a Rx of Bactrim to her pharmacy. Dr. Jens Som is ok with Bactrim as well

## 2023-12-03 NOTE — Progress Notes (Signed)
 Spoke with the patient, Cr is elevated, UA shows a UTI. Will discuss with our clinical pharmacist and prescribe abx. I will take care of the abx rx. Need repeat BMET in 1 week, Destany, please arrange

## 2023-12-03 NOTE — Telephone Encounter (Signed)
 Patient states she is still in pain, calling to see if the dr can prescribe her something else.

## 2023-12-07 ENCOUNTER — Other Ambulatory Visit: Payer: Self-pay

## 2023-12-07 DIAGNOSIS — Z79899 Other long term (current) drug therapy: Secondary | ICD-10-CM

## 2023-12-29 ENCOUNTER — Ambulatory Visit: Admitting: Physician Assistant

## 2024-01-06 ENCOUNTER — Emergency Department (HOSPITAL_COMMUNITY): Admission: EM | Admit: 2024-01-06 | Discharge: 2024-01-07 | Discharge status: Against Medical Advice

## 2024-01-06 ENCOUNTER — Emergency Department (HOSPITAL_COMMUNITY)

## 2024-01-06 ENCOUNTER — Other Ambulatory Visit: Payer: Self-pay

## 2024-01-06 ENCOUNTER — Ambulatory Visit (HOSPITAL_COMMUNITY): Admission: EM | Admit: 2024-01-06 | Discharge: 2024-01-06 | Disposition: A | Discharge status: Home / Self Care

## 2024-01-06 ENCOUNTER — Encounter (HOSPITAL_COMMUNITY): Payer: Self-pay

## 2024-01-06 ENCOUNTER — Encounter (HOSPITAL_COMMUNITY): Payer: Self-pay | Admitting: Emergency Medicine

## 2024-01-06 DIAGNOSIS — Z5321 Procedure and treatment not carried out due to patient leaving prior to being seen by health care provider: Secondary | ICD-10-CM | POA: Insufficient documentation

## 2024-01-06 DIAGNOSIS — M7989 Other specified soft tissue disorders: Secondary | ICD-10-CM | POA: Diagnosis not present

## 2024-01-06 DIAGNOSIS — R296 Repeated falls: Secondary | ICD-10-CM

## 2024-01-06 DIAGNOSIS — R531 Weakness: Secondary | ICD-10-CM | POA: Insufficient documentation

## 2024-01-06 DIAGNOSIS — M25562 Pain in left knee: Secondary | ICD-10-CM | POA: Insufficient documentation

## 2024-01-06 DIAGNOSIS — W01198A Fall on same level from slipping, tripping and stumbling with subsequent striking against other object, initial encounter: Secondary | ICD-10-CM | POA: Insufficient documentation

## 2024-01-06 LAB — COMPREHENSIVE METABOLIC PANEL WITH GFR
ALT: 12 U/L (ref 0–44)
AST: 17 U/L (ref 15–41)
Albumin: 3.5 g/dL (ref 3.5–5.0)
Alkaline Phosphatase: 92 U/L (ref 38–126)
Anion gap: 12 (ref 5–15)
BUN: 40 mg/dL — ABNORMAL HIGH (ref 8–23)
CO2: 22 mmol/L (ref 22–32)
Calcium: 9.1 mg/dL (ref 8.9–10.3)
Chloride: 103 mmol/L (ref 98–111)
Creatinine, Ser: 2.31 mg/dL — ABNORMAL HIGH (ref 0.44–1.00)
GFR, Estimated: 22 mL/min — ABNORMAL LOW (ref >60)
Glucose, Bld: 102 mg/dL — ABNORMAL HIGH (ref 70–99)
Potassium: 4.3 mmol/L (ref 3.5–5.1)
Sodium: 137 mmol/L (ref 135–145)
Total Bilirubin: 1.1 mg/dL (ref 0.0–1.2)
Total Protein: 7.1 g/dL (ref 6.5–8.1)

## 2024-01-06 LAB — CBC
HCT: 35.1 % — ABNORMAL LOW (ref 36.0–46.0)
Hemoglobin: 11.3 g/dL — ABNORMAL LOW (ref 12.0–15.0)
MCH: 35.6 pg — ABNORMAL HIGH (ref 26.0–34.0)
MCHC: 32.2 g/dL (ref 30.0–36.0)
MCV: 110.7 fL — ABNORMAL HIGH (ref 80.0–100.0)
Platelets: 205 10*3/uL (ref 150–400)
RBC: 3.17 MIL/uL — ABNORMAL LOW (ref 3.87–5.11)
RDW: 14.6 % (ref 11.5–15.5)
WBC: 5.6 10*3/uL (ref 4.0–10.5)
nRBC: 0 % (ref 0.0–0.2)

## 2024-01-06 LAB — URINALYSIS, W/ REFLEX TO CULTURE (INFECTION SUSPECTED)
Bilirubin Urine: NEGATIVE
Glucose, UA: NEGATIVE mg/dL
Hgb urine dipstick: NEGATIVE
Ketones, ur: NEGATIVE mg/dL
Leukocytes,Ua: NEGATIVE
Nitrite: NEGATIVE
Protein, ur: NEGATIVE mg/dL
Specific Gravity, Urine: 1.012 (ref 1.005–1.030)
pH: 5 (ref 5.0–8.0)

## 2024-01-06 NOTE — ED Triage Notes (Signed)
 Pt here for left knee pain that treated last week. Pt had falls before knee started to hurt.  Left knee swelling.

## 2024-01-06 NOTE — ED Notes (Signed)
 Patient is being discharged from the Urgent Care and sent to the Emergency Department via POV . Per Jerri Morale, NP, patient is in need of higher level of care due to limited resources; fall, has hit head and on blood thinner. Patient is aware and verbalizes understanding of plan of care.  Vitals:   01/06/24 1615  BP: (!) 93/58  Pulse: 76  Resp: 20  Temp: 97.9 F (36.6 C)  SpO2: 94%

## 2024-01-06 NOTE — ED Provider Triage Note (Addendum)
 Emergency Medicine Provider Triage Evaluation Note  Monica Zamora , a 73 y.o. female  was evaluated in triage.  Pt complains of weakness, multiple falls. Patients's son mentions that she has become progressively weaker since Saturday, has fallen 3-4x since then including last night when she fell, striking her head against the footboard. She c/o L knee pain/swelling. Denies chest pain, worsening cough, abdominal pain, nausea, vomiting, dysuria, fevers. Patient on 2L [place] at home as needed, h/o COPD. On Eliquis .  Review of Systems  Positive: As above Negative: As above   Physical Exam  Ht 5\' 6"  (1.676 m)   Wt 82.1 kg   BMI 29.21 kg/m  Gen:   Awake, no distress   Resp:  Normal effort  MSK:   Moves extremities without difficulty. L knee swollen, 2+ popliteal/DP pulses. In-tact passive flexion/extension L knee.  Other:    Medical Decision Making  Medically screening exam initiated at 5:43 PM.  Appropriate orders placed.  Monica Zamora was informed that the remainder of the evaluation will be completed by another provider, this initial triage assessment does not replace that evaluation, and the importance of remaining in the ED until their evaluation is complete.     Kendrick Pax, PA-C 01/06/24 1747    Kendrick Pax, PA-C 01/06/24 1755

## 2024-01-06 NOTE — ED Provider Notes (Signed)
 [facility]    CSN: [id number] Arrival date & time: 01/06/24  1505      History   Chief Complaint Chief Complaint  Patient presents with   Fall    HPI Monica Zamora is a 73 y.o. female.   73 year old female with past medical history of A-fib, CAD, COPD, hypertension, CABG. she presents today for generalized weakness, multiple falls since Saturday.  She is hypotensive today.  Reports that Saturday she rolled off the couch and fell hitting her side.  Per son the next 2 falls she ended up hitting her head.  She is on Eliquis .  Normally she is amatory and able to do things for herself until the last week.  She was recently started back on all of her cardiac meds after being taken off due to increased kidney function.  She also was started on Flexeril recently for back pain.  She has also had decline in cognition over the past couple of days.  No complaints of chest pain, shortness of breath, headache, blurry vision.  She does have left knee pain and swelling.   Fall    Past Medical History:  Diagnosis Date   A-fib [facility])    Carotid artery disease (HCC)    COPD (chronic obstructive pulmonary disease) (HCC)    Coronary artery disease    Former tobacco use    Heart failure with mildly reduced ejection fraction (HFmrEF) (HCC)    Hx of CABG    Hypertension    PUD (peptic ulcer disease)     Patient Active Problem List   Diagnosis Date Noted   Acute on chronic systolic CHF (congestive heart failure) (HCC) 03/25/2023   PVD (peripheral vascular disease) (HCC) 03/25/2023   Coronary artery disease 03/25/2023   Acute kidney injury superimposed on chronic kidney disease (HCC) 03/25/2023   Depression 03/25/2023   Acute on chronic combined systolic and diastolic CHF (congestive heart failure) (HCC) 03/13/2023   COPD (chronic obstructive pulmonary disease) (HCC) 08/09/2021   Paroxysmal atrial fibrillation (HCC) 08/08/2021   COPD with acute exacerbation (HCC) 08/08/2021    Hypokalemia 08/08/2021   CAD (coronary artery disease) 08/08/2021   Essential hypertension 08/08/2021   Hyperlipidemia 08/08/2021   Elevated troponin 08/08/2021   Acute respiratory failure with hypoxemia (HCC) 08/08/2021   Thrombocytopenia (HCC) 08/08/2021    Past Surgical History:  Procedure Laterality Date   ABDOMINAL HYSTERECTOMY     CARDIOVERSION N/A 03/20/2023   Procedure: CARDIOVERSION;  Surgeon: Darlis Eisenmenger, MD;  Location: [facility];  Service: Cardiovascular;  Laterality: N/A;   CHOLECYSTECTOMY     CORONARY ARTERY BYPASS GRAFT     RIGHT/LEFT HEART CATH AND CORONARY/GRAFT ANGIOGRAPHY N/A 03/17/2023   Procedure: RIGHT/LEFT HEART CATH AND CORONARY/GRAFT ANGIOGRAPHY;  Surgeon: Arleen Lacer, MD;  Location: [facility] INVAS[facility] CV LAB;  Service: Cardiovascular;  Laterality: N/A;   ruptured disc     SPINE SURGERY     TEE WITHOUT CARDIOVERSION N/A 03/20/2023   Procedure: TRANSESOPHAGEAL ECHOCARDIOGRAM;  Surgeon: Darlis Eisenmenger, MD;  Location: [facility] INVASIVE CV LAB;  Service: Cardiovascular;  Laterality: N/A;    OB History   No obstetric history on file.      Home Medications    Prior to Admission medications   Medication Sig Start Date End Date Taking? Authorizing Provider  cyclobenzaprine (FLEXERIL) 10 MG tablet Take 10 mg by mouth. 12/31/23  Yes [provider]  spironolactone  (ALDACTONE ) 25 MG tablet Take 12.5 mg by mouth daily. 12/28/23  Yes [provider]  acetaminophen  (TYLENOL ) 500 MG tablet Take 1,000 mg by mouth 2 (two) times daily as needed (pain).    [provider]  albuterol  (VENTOLIN  HFA) 108 (90 Base) MCG/ACT inhaler Inhale 1 puff into the lungs 2 (two) times daily as needed for wheezing or shortness of breath. 07/10/21   [provider]  ALPRAZolam  (XANAX ) 0.5 MG tablet Take 1 tablet (0.5 mg total) by mouth at bedtime as needed for anxiety. Patient not taking: Reported on 11/30/2023 07/29/21   Abraham Abo, MD  apixaban   (ELIQUIS ) 5 MG TABS tablet Take 1 tablet (5 mg total) by mouth 2 (two) times daily. 08/12/23   Lenise Quince, MD  aspirin  81 MG chewable tablet Chew 81 mg by mouth daily.    [provider]  diazepam (VALIUM) 5 MG tablet Take 5 mg by mouth as needed. 11/06/23   [provider]  FARXIGA  10 MG TABS tablet Take 10 mg by mouth daily. 08/06/23   [provider]  furosemide  (LASIX ) 40 MG tablet Take 1 tablet (40 mg total) by mouth as needed for edema. 11/30/23 02/28/24  Meng, Hao, PA  levalbuterol  (XOPENEX ) 1.25 MG/0.5ML nebulizer solution Take 1.25 mg by nebulization every 4 (four) hours as needed for wheezing or shortness of breath. 02/27/23   Desai, Nikita S, MD  metoprolol  succinate (TOPROL -XL) 25 MG 24 hr tablet Take 0.5 tablets (12.5 mg total) by mouth at bedtime. Take with or immediately following a meal. 08/12/23   Sheryl Donna, NP  pravastatin  (PRAVACHOL ) 20 MG tablet Take 1 tablet (20 mg total) by mouth daily at 6 PM. 11/30/23 12/30/23  Meng, Hao, PA  sertraline  (ZOLOFT ) 100 MG tablet Take 100 mg by mouth. 08/28/22   [provider]  sulfamethoxazole -trimethoprim  (BACTRIM  DS) 800-160 MG tablet Take 1 tablet by mouth 2 (two) times daily. 12/03/23   Ervin Heath, PA    Family History Family History  Problem Relation Age of Onset   Lupus Mother    Lung disease Neg Hx     Social History Social History   Tobacco Use   Smoking status: Former    Current packs/day: 0.00    Average packs/day: 1.5 packs/day for 54.9 years (82.3 ttl pk-yrs)    Types: Cigarettes    Start date: 09/16/1967    Quit date: 08/05/2022    Years since quitting: 1.4   Smokeless tobacco: Never  Vaping Use   Vaping status: Never Used  Substance Use Topics   Alcohol use: Not Currently   Drug use: Never     Allergies   Patient has no known allergies.   Review of Systems Review of Systems See HPI  Physical Exam Triage Vital Signs ED Triage Vitals  Encounter Vitals Group     BP  01/06/24 1615 (!) 93/58     Systolic BP Percentile --      Diastolic BP Percentile --      Pulse Rate 01/06/24 1615 76     Resp 01/06/24 1615 20     Temp 01/06/24 1615 97.9 F (36.6 C)     Temp Source 01/06/24 1615 Oral     SpO2 01/06/24 1615 94 %     Weight --      Height --      Head Circumference --      Peak Flow --      Pain Score 01/06/24 1611 10     Pain Loc --      Pain  Education --      Exclude from [employer] Chart --    No data found.  Updated Vital Signs BP (!) 93/58 (BP Location: Left Arm)   Pulse 76   Temp 97.9 F (36.6 C) (Oral)   Resp 20   SpO2 94%   Visual Acuity Right Eye Distance:   Left Eye Distance:   Bilateral Distance:    Right Eye Near:   Left Eye Near:    Bilateral Near:     Physical Exam Vitals and nursing note reviewed.  Constitutional:      General: She is not in acute distress.    Appearance: She is ill-appearing. She is not toxic-appearing.  Eyes:     Comments: Pale conjunctiva  Musculoskeletal:        General: Swelling and tenderness present.     Comments: Generalized swelling and tenderness to left knee.   Skin:    Coloration: Skin is pale.  Neurological:     Mental Status: She is alert.      UC Treatments / Results  Labs (all labs ordered are listed, but only abnormal results are displayed) Labs Reviewed - No data to display  EKG   Radiology No results found.  Procedures Procedures (including critical care time)  Medications Ordered in UC Medications - No data to display  Initial Impression / Assessment and Plan / UC Course  I have reviewed the triage vital signs and the nursing notes.  Pertinent labs & imaging results that were available during my care of the patient were reviewed by me and considered in my medical decision making (see chart for details).     Weakness, frequent falls, left knee pain-patient is a 73 year old female with significant cardiac history that presents today with weakness and frequent  falls. She has hit her head twice in the past 5 days due to falling. She is very weak and borderline hypotensive today.  Pressures typically run in the 150s systolic She is very pale today and seems a little cognitively declined. Based on all of her symptoms and situation recommend go to the ER for further evaluation to include imaging and blood work Her son is with her and he is taking her to the ER Final Clinical Impressions(s) / UC Diagnoses   Final diagnoses:  Weakness  Falls frequently  Acute pain of left knee     Discharge Instructions      Please go to the ER for further evaluation     ED Prescriptions   None    PDMP not reviewed this encounter.   Landa Pine, FNP 01/06/24 1655

## 2024-01-06 NOTE — ED Notes (Signed)
 Took a muscle relaxer this morning and since has had advil

## 2024-01-06 NOTE — ED Notes (Signed)
 Unable to urinate

## 2024-01-06 NOTE — ED Triage Notes (Signed)
 Patient fell yesterday and then 2 days prior to that.   Patient is weak and having difficulty moving.  Left knee is swollen and painful and pelvic pain.  Knee is the most painful area.    Provider changed patient's medicine and placed her  on cyclobenzaprine.  This medicine was for chronic back pain.  Recently treated for  UTI

## 2024-01-06 NOTE — Discharge Instructions (Signed)
 Please go to the ER for further evaluation

## 2024-01-06 NOTE — ED Notes (Signed)
 Pt along with family leaving the ER stating that they will follow up with PCP

## 2024-01-15 ENCOUNTER — Ambulatory Visit: Attending: Physician Assistant | Admitting: Physician Assistant

## 2024-01-15 ENCOUNTER — Encounter: Payer: Self-pay | Admitting: Physician Assistant

## 2024-01-15 VITALS — BP 112/66 | HR 89 | Ht 66.0 in | Wt 181.0 lb

## 2024-01-15 DIAGNOSIS — I480 Paroxysmal atrial fibrillation: Secondary | ICD-10-CM | POA: Diagnosis not present

## 2024-01-15 DIAGNOSIS — I10 Essential (primary) hypertension: Secondary | ICD-10-CM

## 2024-01-15 DIAGNOSIS — N179 Acute kidney failure, unspecified: Secondary | ICD-10-CM | POA: Diagnosis not present

## 2024-01-15 DIAGNOSIS — I25810 Atherosclerosis of coronary artery bypass graft(s) without angina pectoris: Secondary | ICD-10-CM | POA: Diagnosis not present

## 2024-01-15 DIAGNOSIS — R531 Weakness: Secondary | ICD-10-CM | POA: Diagnosis not present

## 2024-01-15 NOTE — Progress Notes (Unsigned)
 Cardiology Office Note:  .   Date:  01/15/2024  ID:  Tivis Forster, DOB Jan 12, 1951, MRN [id number] PCP: Fonda Hymen, NP  [facility] Providers Cardiologist:  Alexandria Angel, MD { Click to update primary MD,subspecialty MD or APP then REFRESH:1}   History of Present Illness: Aaron Aas   AALIANA ASENCIO is a 73 y.o. female with past medical history of CAD s/p CABG 2021, carotid artery disease, CHF, atrial fibrillation and hypertension.  Carotid Doppler in June 2024 showed 60 to 79% right ICA disease, 40 to 59% left ICA disease.  She was admitted in July 2024 with CHF exacerbation, he was also noted to be in atrial fibrillation at the time.  ABI in July 2024 showed a moderate right and left disease.  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  Cardiac catheterization in July 2024 showed occluded RCA, occluded left circumflex, 60% followed by 70% LAD lesion, 55% PDA lesion and patent LIMA-LAD, patent SVG-OM 3 and patent SVG-PDA.  Renal Doppler showed 1 to 59% bilateral renal artery stenosis.  Transesophageal echocardiogram in July 2024 showed EF 30 to 35%, mild LVH, moderate RV dysfunction, mild RV enlargement, left atrial appendage s/p surgical clipping, mild right atrial enlargement, moderate to severe TR, mild MR.  Patient underwent cardioversion and was diuresed with improvement.  She was discharged on amiodarone . Patient was last seen by Dr. Audery Blazing in November 2024.  Since she has severe home O2 dependent COPD, therefore the decision was made to discontinue amiodarone .  She was continued on Eliquis .  It was felt that if she develop any recurrent atrial fibrillation in the future, Tikosyn may be a good option.  As for heart failure, she appears to be euvolemic on the last visit, she is not on Farxiga  or Jardiance due to recurrent UTI.  6 weeks repeat echocardiogram was recommended.  Echocardiogram  obtained on 09/14/2023 showed EF 50 to 55%, no regional wall motion abnormality, low normal RV systolic function, no significant valve issue.    When she followed up with PCP earlier this year, she was normally on Lasix  and the Entresto .  Blood work showed potassium of 6.3, she was instructed to discontinue spironolactone  as well.  I saw the patient on 11/30/2023 at which time she was already off of all 3 medication including Entresto  spironolactone  and Lasix .  Creatinine peaked at 2.2 in February and now has came down to 1.7.  She did not appear to be significantly volume overloaded.  I changed Lasix  to as needed.  I asked her to resume pravastatin .  I was hoping once her potassium came down, we can rechallenge her with low-dose of Entresto .  Blood work a month ago showed elevated creatinine and a UA showed a urinary tract infection.  I prescribed her antibiotic.  She presented to the urgent care on 01/06/2024 with weakness and multiple falls.  She was hypotensive the day.  Repeat blood work  showed worsening renal function with creatinine up to 2.31.  She was instructed to go to the emergency room however left AMA.  CT of the head showed no acute abnormality.  CT of the cervical spine showed no acute fracture, degenerative changes in the neck.  Chest x-ray normal.  Knee x-ray showed a small left knee effusion.  Urinalysis showed rare bacteria negative nitrite.  Patient presents today for follow-up.  Family says she has been getting weaker recently.  Her UA from a week ago is much improved when compared to the previous urinalysis from a month ago.  For some reason, she is still on spironolactone  which we will discontinue.  I will get a limited echocardiogram to make sure her EF is still normal.  We will obtain a basic metabolic panel today to reassess her renal function.  She is scheduled to see nephrology service in the next month.  I plan to reassess patient in 4 to 6 weeks.  Otherwise, she appears to be  euvolemic, therefore I recommend to continue with as needed dose of diuretic.  ROS: ***  Studies Reviewed: .        *** Risk Assessment/Calculations:   {Does this patient have ATRIAL FIBRILLATION?:[phone]}         Physical Exam:   VS:  BP 112/66   Pulse 89   Ht 5\' 6"  (1.676 m)   Wt 181 lb (82.1 kg)   SpO2 98%   BMI 29.21 kg/m    Wt Readings from Last 3 Encounters:  01/15/24 181 lb (82.1 kg)  01/06/24 181 lb (82.1 kg)  11/30/23 186 lb 9.6 oz (84.6 kg)    GEN: Well nourished, well developed in no acute distress NECK: No JVD; No carotid bruits CARDIAC: ***RRR, no murmurs, rubs, gallops RESPIRATORY:  Clear to auscultation without rales, wheezing or rhonchi  ABDOMEN: Soft, non-tender, non-distended EXTREMITIES:  No edema; No deformity   ASSESSMENT AND PLAN: .   ***    {Are you ordering a CV Procedure (e.g. stress test, cath, DCCV, TEE, etc)?   Press F2        :[id number]}  Dispo: ***  Signed, Ervin Heath, PA

## 2024-01-15 NOTE — Patient Instructions (Signed)
 Medication Instructions:  DISCONTINUED SPIRONOLACTONE   *If you need a refill on your cardiac medications before your next appointment, please call your pharmacy*  Lab Work: BMP TODAY If you have labs (blood work) drawn today and your tests are completely normal, you will receive your results only by: MyChart Message (if you have MyChart) OR A paper copy in the mail If you have any lab test that is abnormal or we need to change your treatment, we will call you to review the results.  Testing/Procedures:[address] Your physician has requested that you have an echocardiogram. Echocardiography is a painless test that uses sound waves to create images of your heart. It provides your doctor with information about the size and shape of your heart and how well your heart's chambers and valves are working. This procedure takes approximately one hour. There are no restrictions for this procedure. Please do NOT wear cologne, perfume, aftershave, or lotions (deodorant is allowed). Please arrive 15 minutes prior to your appointment time.  Please note: We ask at that you not bring children with you during ultrasound (echo/ vascular) testing. Due to room size and safety concerns, children are not allowed in the ultrasound rooms during exams. Our front office staff cannot provide observation of children in our lobby area while testing is being conducted. An adult accompanying a patient to their appointment will only be allowed in the ultrasound room at the discretion of the ultrasound technician under special circumstances. We apologize for any inconvenience.   Follow-Up: At [facility], you and your health needs are our priority.  As part of our continuing mission to provide you with exceptional heart care, our providers are all part of one team.  This team includes your primary Cardiologist (physician) and Advanced Practice Providers or APPs (Physician Assistants and Nurse Practitioners) who all  work together to provide you with the care you need, when you need it.  Your next appointment:   4-6 week(s)  Provider:   Alexandria Angel, MD or Ervin Heath, [place]

## 2024-01-16 ENCOUNTER — Emergency Department (HOSPITAL_COMMUNITY)
Admission: EM | Admit: 2024-01-16 | Discharge: 2024-01-16 | Disposition: A | Discharge status: Home / Self Care | Attending: Emergency Medicine | Admitting: Emergency Medicine

## 2024-01-16 ENCOUNTER — Other Ambulatory Visit: Payer: Self-pay

## 2024-01-16 ENCOUNTER — Emergency Department (HOSPITAL_COMMUNITY)

## 2024-01-16 DIAGNOSIS — W1841XA Slipping, tripping and stumbling without falling due to stepping on object, initial encounter: Secondary | ICD-10-CM | POA: Insufficient documentation

## 2024-01-16 DIAGNOSIS — Z7982 Long term (current) use of aspirin: Secondary | ICD-10-CM | POA: Insufficient documentation

## 2024-01-16 DIAGNOSIS — S8002XA Contusion of left knee, initial encounter: Secondary | ICD-10-CM | POA: Diagnosis not present

## 2024-01-16 DIAGNOSIS — S80912A Unspecified superficial injury of left knee, initial encounter: Secondary | ICD-10-CM | POA: Diagnosis present

## 2024-01-16 DIAGNOSIS — R519 Headache, unspecified: Secondary | ICD-10-CM | POA: Diagnosis not present

## 2024-01-16 DIAGNOSIS — Z7901 Long term (current) use of anticoagulants: Secondary | ICD-10-CM | POA: Insufficient documentation

## 2024-01-16 LAB — BASIC METABOLIC PANEL WITH GFR
BUN/Creatinine Ratio: 16 (ref 12–28)
BUN: 21 mg/dL (ref 8–27)
CO2: 18 mmol/L — ABNORMAL LOW (ref 20–29)
Calcium: 9.5 mg/dL (ref 8.7–10.3)
Chloride: 102 mmol/L (ref 96–106)
Creatinine, Ser: 1.31 mg/dL — ABNORMAL HIGH (ref 0.57–1.00)
Glucose: 88 mg/dL (ref 70–99)
Potassium: 5 mmol/L (ref 3.5–5.2)
Sodium: 138 mmol/L (ref 134–144)
eGFR: 43 mL/min/{1.73_m2} — ABNORMAL LOW (ref >59)

## 2024-01-16 MED ORDER — ACETAMINOPHEN 325 MG PO TABS
650.0000 mg | ORAL_TABLET | Freq: Once | ORAL | Status: AC
  Administered 2024-01-16: 650 mg via ORAL
  Filled 2024-01-16: qty 2

## 2024-01-16 NOTE — Discharge Instructions (Signed)
 Tylenol  as needed for pain.  Schedule to see your Physician for recheck.  Return if any problems.

## 2024-01-16 NOTE — ED Notes (Signed)
 Assisted patient to the restroom with 1x assist.

## 2024-01-16 NOTE — ED Provider Notes (Signed)
 [place] Provider Note   CSN: [id number] Arrival date & time: 01/16/24  7829     History {Add pertinent medical, surgical, social history, OB history to HPI:1} Chief Complaint  Patient presents with   Fall   Knee Pain    Monica Zamora is a 73 y.o. female.  Patient reports that she tripped over a water bottle in her floor.  Patient states that her kitten had been playing with the water bottle and she did not know it was on the floor.  Patient reports she fell hitting her left knee.  Patient did not think that she hit her head.  She does have blood on the left side of her mouth.  Patient is on Eliquis .  Patient states that she did not lose consciousness.  Patient states she was unable to get up after the fall and called EMS.  EMS reports patient struck her left knee.  Patient states that the fall was from tripping she did not lose consciousness.  The history is provided by the patient. No language interpreter was used.  Fall This is a new problem.  Knee Pain      Home Medications Prior to Admission medications   Medication Sig Start Date End Date Taking? Authorizing Provider  acetaminophen  (TYLENOL ) 500 MG tablet Take 1,000 mg by mouth 2 (two) times daily as needed (pain).    [provider]  albuterol  (VENTOLIN  HFA) 108 (90 Base) MCG/ACT inhaler Inhale 1 puff into the lungs 2 (two) times daily as needed for wheezing or shortness of breath. 07/10/21   [provider]  ALPRAZolam  (XANAX ) 0.5 MG tablet Take 1 tablet (0.5 mg total) by mouth at bedtime as needed for anxiety. 07/29/21   Abraham Abo, MD  apixaban  (ELIQUIS ) 5 MG TABS tablet Take 1 tablet (5 mg total) by mouth 2 (two) times daily. 08/12/23   Lenise Quince, MD  aspirin  81 MG chewable tablet Chew 81 mg by mouth daily.    [provider]  cyclobenzaprine (FLEXERIL) 10 MG tablet Take 10 mg by mouth. 12/31/23   [provider]   diazepam (VALIUM) 5 MG tablet Take 5 mg by mouth as needed. 11/06/23   [provider]  FARXIGA  10 MG TABS tablet Take 10 mg by mouth daily. 08/06/23   [provider]  furosemide  (LASIX ) 40 MG tablet Take 1 tablet (40 mg total) by mouth as needed for edema. 11/30/23 02/28/24  Meng, Hao, PA  levalbuterol  (XOPENEX ) 1.25 MG/0.5ML nebulizer solution Take 1.25 mg by nebulization every 4 (four) hours as needed for wheezing or shortness of breath. 02/27/23   Aleck Hurdle, MD  metoprolol  succinate (TOPROL -XL) 25 MG 24 hr tablet Take 0.5 tablets (12.5 mg total) by mouth at bedtime. Take with or immediately following a meal. 08/12/23   Sheryl Donna, NP  pravastatin  (PRAVACHOL ) 20 MG tablet Take 1 tablet (20 mg total) by mouth daily at 6 PM. 11/30/23 01/15/24  Meng, Hao, PA  sertraline  (ZOLOFT ) 100 MG tablet Take 100 mg by mouth. 08/28/22   [provider]      Allergies    Patient has no known allergies.    Review of Systems   Review of Systems  Physical Exam Updated Vital Signs BP (!) 144/68 (BP Location: Right Arm)   Pulse 84   Temp 98.4 F (36.9 C) (Oral)   Resp 16   SpO2 100%  Physical Exam  ED Results / Procedures /  Treatments   Labs (all labs ordered are listed, but only abnormal results are displayed) Labs Reviewed - No data to display  EKG None  Radiology CT Head Wo Contrast Result Date: 01/16/2024 CLINICAL DATA:  73 year old female status post fall, tripped over CT. Blunt trauma and pain. EXAM: CT HEAD WITHOUT CONTRAST TECHNIQUE: Contiguous axial images were obtained from the base of the skull through the vertex without intravenous contrast. RADIATION DOSE REDUCTION: This exam was performed according to the departmental dose-optimization program which includes automated exposure control, adjustment of the mA and/or kV according to patient size and/or use of iterative reconstruction technique. COMPARISON:  Head CT 01/06/2024. FINDINGS: Brain: Cerebral volume  is stable and within normal limits for age. No midline shift, ventriculomegaly, mass effect, evidence of mass lesion, intracranial hemorrhage or evidence of cortically based acute infarction. Gray-white matter differentiation is within normal limits throughout the brain. *CRASH* that gray-white differentiation stable and within normal limits for age. Vascular: No suspicious intracranial vascular hyperdensity. Calcified atherosclerosis at the skull base. Skull: Stable. No acute osseous abnormality identified. Carious dentition. Osteopenia. Sinuses/Orbits: Stable paranasal sinuses, well aerated except for the posterior right ethmoid and sphenoid. Tympanic cavities and mastoids remain well aerated. Other: No acute orbit or scalp soft tissue injury identified. IMPRESSION: Stable and negative for age noncontrast CT appearance of the brain. No acute traumatic injury identified. Electronically Signed   By: Marlise Simpers M.D.   On: 01/16/2024 09:48   DG Knee Complete 4 Views Left Result Date: 01/16/2024 CLINICAL DATA:  Fall, knee pain EXAM: LEFT KNEE - COMPLETE 4+ VIEW COMPARISON:  01/06/2024 FINDINGS: Normal alignment without acute osseous finding or fracture. Small effusion noted on the cross-table lateral view. Preserved joint spaces. No significant arthropathy by plain radiography. Diffuse extremity subcutaneous edema noted. Peripheral atherosclerosis present. IMPRESSION: 1. Small left knee effusion. 2. No acute osseous finding by plain radiography. Electronically Signed   By: Melven Stable.  Shick M.D.   On: 01/16/2024 09:33    Procedures Procedures  {Document cardiac monitor, telemetry assessment procedure when appropriate:1}  Medications Ordered in ED Medications - No data to display  ED Course/ Medical Decision Making/ A&P   {   Click here for ABCD2, HEART and other calculatorsREFRESH Note before signing :1}                              Medical Decision Making Amount and/or Complexity of Data Reviewed Radiology:  ordered.   ***  {Document critical care time when appropriate:1} {Document review of labs and clinical decision tools ie heart score, Chads2Vasc2 etc:1}  {Document your independent review of radiology images, and any outside records:1} {Document your discussion with family members, caretakers, and with consultants:1} {Document social determinants of health affecting pt's care:1} {Document your decision making why or why not admission, treatments were needed:1} Final Clinical Impression(s) / ED Diagnoses Final diagnoses:  None    Rx / DC Orders ED Discharge Orders     None

## 2024-01-16 NOTE — ED Triage Notes (Signed)
 Patient here via EMS for mechanical fall this morning. She tripped over a water bottle and her cat. Swelling and pain to L knee. Chronic L knee pain but worse after fall. Did not hit head or pass out. Able to bear weight. No OTC meds PTA. Recently has had more frequent falls.

## 2024-01-16 NOTE — ED Provider Triage Note (Signed)
 Emergency Medicine Provider Triage Evaluation Note  KATELEEN WILDS , a 73 y.o. female  was evaluated in triage.  Pt complains of falling  Pt has a swollen left knee.  Pt denies hitting her head.  Pt has blood running from side of her mouth.   Review of Systems  Positive: Knee pain left  Negative: loc  Physical Exam  BP (!) 144/68 (BP Location: Right Arm)   Pulse 84   Temp 98.4 F (36.9 C) (Oral)   Resp 16   SpO2 100%  Gen:   Awake, no distress   Resp:  Normal effort  MSK:   Swollen left knee  pain with movement Other:  Dried blood left face, dried blood in mouth   Medical Decision Making  Medically screening exam initiated at 8:44 AM.  Appropriate orders placed.  KAMBREY BERGKAMP was informed that the remainder of the evaluation will be completed by another provider, this initial triage assessment does not replace that evaluation, and the importance of remaining in the ED until their evaluation is complete.     Sandi Crosby, PA-C 01/16/24 [phone]

## 2024-01-17 NOTE — Addendum Note (Signed)
 Addended byHeddie Lively, Minor Iden on: 01/17/2024 07:49 PM   Modules accepted: Level of Service

## 2024-01-20 ENCOUNTER — Telehealth (HOSPITAL_COMMUNITY): Payer: Self-pay | Admitting: Cardiology

## 2024-01-20 ENCOUNTER — Other Ambulatory Visit (HOSPITAL_COMMUNITY): Payer: Self-pay

## 2024-01-20 NOTE — Telephone Encounter (Signed)
 Front office left patient a voice message to call front office back to schedule a follow up visit for Dr. Bruce Caper. Maribeth Shivers office staff received message from [place], [place] that this patient is due for a f/u visit in the [facility].

## 2024-01-26 ENCOUNTER — Other Ambulatory Visit: Payer: Self-pay | Admitting: *Deleted

## 2024-01-26 DIAGNOSIS — I6523 Occlusion and stenosis of bilateral carotid arteries: Secondary | ICD-10-CM

## 2024-02-04 ENCOUNTER — Ambulatory Visit (HOSPITAL_COMMUNITY)
Admission: RE | Admit: 2024-02-04 | Discharge: 2024-02-04 | Disposition: A | Discharge status: Home / Self Care | Source: Ambulatory Visit | Attending: Adult Health | Admitting: Adult Health

## 2024-02-04 VITALS — BP 120/70 | HR 77 | Wt 174.6 lb

## 2024-02-04 DIAGNOSIS — Z87891 Personal history of nicotine dependence: Secondary | ICD-10-CM | POA: Insufficient documentation

## 2024-02-04 DIAGNOSIS — I2510 Atherosclerotic heart disease of native coronary artery without angina pectoris: Secondary | ICD-10-CM | POA: Diagnosis not present

## 2024-02-04 DIAGNOSIS — Z9981 Dependence on supplemental oxygen: Secondary | ICD-10-CM | POA: Diagnosis not present

## 2024-02-04 DIAGNOSIS — I701 Atherosclerosis of renal artery: Secondary | ICD-10-CM | POA: Diagnosis not present

## 2024-02-04 DIAGNOSIS — J9611 Chronic respiratory failure with hypoxia: Secondary | ICD-10-CM | POA: Insufficient documentation

## 2024-02-04 DIAGNOSIS — I480 Paroxysmal atrial fibrillation: Secondary | ICD-10-CM | POA: Diagnosis not present

## 2024-02-04 DIAGNOSIS — N1831 Chronic kidney disease, stage 3a: Secondary | ICD-10-CM | POA: Insufficient documentation

## 2024-02-04 DIAGNOSIS — Z951 Presence of aortocoronary bypass graft: Secondary | ICD-10-CM | POA: Diagnosis not present

## 2024-02-04 DIAGNOSIS — I5022 Chronic systolic (congestive) heart failure: Secondary | ICD-10-CM | POA: Insufficient documentation

## 2024-02-04 DIAGNOSIS — N390 Urinary tract infection, site not specified: Secondary | ICD-10-CM | POA: Diagnosis present

## 2024-02-04 DIAGNOSIS — Z7901 Long term (current) use of anticoagulants: Secondary | ICD-10-CM | POA: Diagnosis not present

## 2024-02-04 DIAGNOSIS — I130 Hypertensive heart and chronic kidney disease with heart failure and stage 1 through stage 4 chronic kidney disease, or unspecified chronic kidney disease: Secondary | ICD-10-CM | POA: Insufficient documentation

## 2024-02-04 DIAGNOSIS — K551 Chronic vascular disorders of intestine: Secondary | ICD-10-CM | POA: Diagnosis not present

## 2024-02-04 NOTE — Patient Instructions (Signed)
 Great to see you today  Follow-Up in: as needed   At the [facility], you and your health needs are our priority. We have a designated team specialized in the treatment of Heart Failure. This Care Team includes your primary Heart Failure Specialized Cardiologist (physician), Advanced Practice Providers (APPs- Physician Assistants and Nurse Practitioners), and Pharmacist who all work together to provide you with the care you need, when you need it.   You may see any of the following providers on your designated Care Team at your next follow up:  Dr. Jules Oar Dr. Peder Bourdon Dr. Alwin Baars Dr. Judyth Nunnery Nieves Bars, NP Ruddy Corral, [place] [facility], [place] Dennise Fitz, NP [place] Lee, NP Luster Salters, PharmD   Please be sure to bring in all your medications bottles to every appointment.   Need to Contact Us :  If you have any questions or concerns before your next appointment please send us  a message through [place] or call our office at [phone].    TO LEAVE A MESSAGE FOR THE NURSE SELECT OPTION 2, PLEASE LEAVE A MESSAGE INCLUDING: YOUR NAME DATE OF BIRTH CALL BACK NUMBER REASON FOR CALL**this is important as we prioritize the call backs  YOU WILL RECEIVE A CALL BACK THE SAME DAY AS LONG AS YOU CALL BEFORE 4:00 PM

## 2024-02-04 NOTE — Progress Notes (Signed)
 HF MD: DR Bruce Caper  Cardiology: Dr Leandra Pro.  Chief Complaint: Heart Failure  HPI: Monica Zamora is a 73 y.o. female  paroxysmal atrial fibrillation, hypertension, chronic hypoxemic respiratory failure on 2 L nasal cannula, CAD status post CABG in 2021, CAD, heart failure with reduced ejection fraction (EF 40%), CKD IIIa, hx of tobacco use.   Admitted on 03/13/2023 with substernal chest pain with radiation to the epigastrium.  In the ER patient noted to be hypotensive with systolic blood pressure in the 70s and bradycardic to the 30s.  She was given glucagon  to reverse home beta-blocker dose.  Workup at that time was remarkable for BNP greater than 1600, chest x-ray with pulmonary edema. CT scan on admission also notable for mesenteric & celiac artery stenosis. Today she underwent LHC/RHC; HF consulted due to severely elevated filling pressures.    Cardiac history dates back to 2021 when she underwent CABG at [facility] in [place] ; also noticed to have paroxysmal atrial fibrillation on Eliquis  and Multaq  since that time.  Echocardiogram in 2022 with a EF of 50 to 55%.  Repeat echocardiogram in June 2024 with decline in EF to 40 to 45% and severely elevated PASP of 77.   December 2024 EF improved to 50-55% and RV low normal.   Today she returns for HF follow up with her caregiver. Complaining of fatigue from UTI. Recently completed 10 days of antibiotics. Denies SOB/PND/Orthopnea.  Having some lower ankle edema. Uses a walker in the house. Appetite ok. No fever or chills.  Taking all medications. Lives with a family friend.   ROS: All systems negative except as listed in HPI, PMH and Problem List.  SH:  Social History   Socioeconomic History   Marital status: Divorced    Spouse name: Not on file   Number of children: 2   Years of education: Not on file   Highest education level: 11th grade  Occupational History   Occupation: Retired  Tobacco Use   Smoking status:  Former    Current packs/day: 0.00    Average packs/day: 1.5 packs/day for 54.9 years (82.3 ttl pk-yrs)    Types: Cigarettes    Start date: 09/16/1967    Quit date: 08/05/2022    Years since quitting: 1.5   Smokeless tobacco: Never  Vaping Use   Vaping status: Never Used  Substance and Sexual Activity   Alcohol use: Not Currently   Drug use: Never   Sexual activity: Not on file  Other Topics Concern   Not on file  Social History Narrative   Not on file   Social Drivers of Health   Financial Resource Strain: Low Risk  (12/10/2023)   [facility]   Overall Financial Resource Strain (CARDIA)    Difficulty of Paying Living Expenses: Not hard at all  Food Insecurity: No Food Insecurity (12/10/2023)   [facility]   Hunger Vital Sign    Worried About Running Out of Food in the Last Year: Never true    Ran Out of Food in the Last Year: Never true  Transportation Needs: No Transportation Needs (12/10/2023)   Received from [facility] - Transportation    Lack of Transportation (Medical): No    Lack of Transportation (Non-Medical): No  Physical Activity: Unknown (12/10/2023)   Received from [facility]   Exercise Vital Sign    Days of Exercise per Week: 0 days    Minutes of Exercise per Session: Not  on file  Stress: No Stress Concern Present (12/10/2023)   Received from [facility] - Occupational Stress Questionnaire    Feeling of Stress : Only a little  Recent Concern: Stress - Stress Concern Present (11/06/2023)   Received from [facility] of Occupational Health - Occupational Stress Questionnaire    Feeling of Stress : Very much  Social Connections: Socially Integrated (12/10/2023)   Received from [facility]   Social Network    How would you rate your social network (family, work, friends)?: Good participation with social networks  Intimate Partner Violence: Not At Risk  (12/10/2023)   Received from [facility]   HITS    Over the last 12 months how often did your partner physically hurt you?: Never    Over the last 12 months how often did your partner insult you or talk down to you?: Never    Over the last 12 months how often did your partner threaten you with physical harm?: Never    Over the last 12 months how often did your partner scream or curse at you?: Never    FH:  Family History  Problem Relation Age of Onset   Lupus Mother    Lung disease Neg Hx     Past Medical History:  Diagnosis Date   A-fib (HCC)    Carotid artery disease (HCC)    COPD (chronic obstructive pulmonary disease) (HCC)    Coronary artery disease    Former tobacco use    Heart failure with mildly reduced ejection fraction (HFmrEF) (HCC)    Hx of CABG    Hypertension    PUD (peptic ulcer disease)     Current Outpatient Medications  Medication Sig Dispense Refill   acetaminophen  (TYLENOL ) 500 MG tablet Take 1,000 mg by mouth 2 (two) times daily as needed (pain).     albuterol  (VENTOLIN  HFA) 108 (90 Base) MCG/ACT inhaler Inhale 1 puff into the lungs 2 (two) times daily as needed for wheezing or shortness of breath.     apixaban  (ELIQUIS ) 5 MG TABS tablet Take 1 tablet (5 mg total) by mouth 2 (two) times daily. 180 tablet 1   aspirin  81 MG chewable tablet Chew 81 mg by mouth daily.     cyclobenzaprine (FLEXERIL) 10 MG tablet Take 10 mg by mouth. As needed     diazepam (VALIUM) 5 MG tablet Take 5 mg by mouth as needed.     levalbuterol  (XOPENEX ) 1.25 MG/0.5ML nebulizer solution Take 1.25 mg by nebulization every 4 (four) hours as needed for wheezing or shortness of breath. 1 each 12   metoprolol  succinate (TOPROL -XL) 25 MG 24 hr tablet Take 0.5 tablets (12.5 mg total) by mouth at bedtime. Take with or immediately following a meal. 45 tablet 3   pravastatin  (PRAVACHOL ) 20 MG tablet Take 1 tablet (20 mg total) by mouth daily at 6 PM. 90 tablet 3   sacubitril -valsartan   (ENTRESTO ) 24-26 MG Take 1 tablet by mouth 2 (two) times daily.     No current facility-administered medications for this encounter.    Vitals:   02/04/24 1406  BP: 120/70  Pulse: 77  SpO2: 96%  Weight: 79.2 kg (174 lb 9.6 oz)    PHYSICAL EXAM: General:   No resp difficulty Neck: supple. no JVD.  Cor: PMI nondisplaced. Regular rate & rhythm. No rubs, gallops or murmurs. Lungs: clear Abdomen: soft, nontender, nondistended.  Extremities: no cyanosis, clubbing, rash, R  and LLE ankle edema. edema Neuro: alert & oriented x3  ASSESSMENT & PLAN: 1. Chronic systolic CHF:  Echo 7/24 EF 35-40%, moderately decreased RV systolic function.  RHC 7/24: biventricular elevated filling pressures, low PAPi but preserved CI.  Pulmonary venous hypertension.  Suspect ischemic cardiomyopathy + component of cor pulmonale from COPD.  TEE 7/5 with EF 30-35%, moderate RV dysfunction, mod-severe TR, IVC dilated.   Echo 2024 EF 50-55% RV normal.  - NYHA II. Appears euvolemic.    -Continue Toprol  12.5 mg daily at bedtime.  - Continue entresto  24-26 mg twice a day.  - Hold off on farxiga  with recent UTI 2. CKD stage 3: Baseline 1.2-1.6 I reviewed BMET from 01/15/24, stable.  3. Atrial fibrillation: History of PAF.   - Regular on exam.  -Off amio. Continue Toprol  12.5 mg at bedtime.   - Continue Eliquis  5 mg twice a day.  - May be a candidate for Tikosyn in future to maintain NSR.  4. CAD s/p CABG: Cath 7/2 with patent grafts.   -No chest pain.  - Continue statin.  5. Renal artery stenosis: By CTA.  Renal artery dopplers suggested 1-59% bilateral ICA stenosis.   6. Mesenteric artery stenosis: No intestinal angina reported.   Instructed to contact PCP for UTI symptoms.   Follow up as needed. Echo now 50-55%. She is established with Dr Audery Blazing.   Domonique Brouillard NP-C  3:36 PM

## 2024-02-16 ENCOUNTER — Other Ambulatory Visit: Payer: Self-pay | Admitting: Nephrology

## 2024-02-16 DIAGNOSIS — N1832 Chronic kidney disease, stage 3b: Secondary | ICD-10-CM

## 2024-02-17 ENCOUNTER — Ambulatory Visit: Payer: Self-pay | Admitting: Cardiology

## 2024-02-17 ENCOUNTER — Ambulatory Visit (HOSPITAL_COMMUNITY)
Admission: RE | Admit: 2024-02-17 | Discharge: 2024-02-17 | Disposition: A | Discharge status: Home / Self Care | Source: Ambulatory Visit | Attending: Cardiology | Admitting: Cardiology

## 2024-02-17 ENCOUNTER — Ambulatory Visit (HOSPITAL_COMMUNITY)
Admission: RE | Admit: 2024-02-17 | Discharge: 2024-02-17 | Discharge status: Home / Self Care | Source: Ambulatory Visit | Attending: Cardiology | Admitting: Cardiology

## 2024-02-17 DIAGNOSIS — I4891 Unspecified atrial fibrillation: Secondary | ICD-10-CM | POA: Diagnosis not present

## 2024-02-17 DIAGNOSIS — I10 Essential (primary) hypertension: Secondary | ICD-10-CM

## 2024-02-17 DIAGNOSIS — I6523 Occlusion and stenosis of bilateral carotid arteries: Secondary | ICD-10-CM

## 2024-02-17 DIAGNOSIS — I2510 Atherosclerotic heart disease of native coronary artery without angina pectoris: Secondary | ICD-10-CM | POA: Insufficient documentation

## 2024-02-17 DIAGNOSIS — R531 Weakness: Secondary | ICD-10-CM | POA: Diagnosis not present

## 2024-02-17 DIAGNOSIS — Z951 Presence of aortocoronary bypass graft: Secondary | ICD-10-CM | POA: Diagnosis not present

## 2024-02-18 LAB — ECHOCARDIOGRAM LIMITED
Area-P 1/2: 4.83 cm2
MV M vel: 4.67 m/s
MV Peak grad: 87.2 mmHg
Radius: 0.4 cm
S' Lateral: 3.1 cm

## 2024-02-22 ENCOUNTER — Ambulatory Visit
Admission: RE | Admit: 2024-02-22 | Discharge: 2024-02-22 | Disposition: A | Discharge status: Home / Self Care | Source: Ambulatory Visit | Attending: Nephrology | Admitting: Nephrology

## 2024-02-22 ENCOUNTER — Other Ambulatory Visit

## 2024-02-22 DIAGNOSIS — N1832 Chronic kidney disease, stage 3b: Secondary | ICD-10-CM

## 2024-02-23 ENCOUNTER — Ambulatory Visit: Payer: Self-pay | Admitting: Physician Assistant

## 2024-02-24 ENCOUNTER — Other Ambulatory Visit: Payer: Self-pay | Admitting: *Deleted

## 2024-02-24 DIAGNOSIS — I6523 Occlusion and stenosis of bilateral carotid arteries: Secondary | ICD-10-CM

## 2024-02-25 ENCOUNTER — Ambulatory Visit: Attending: Physician Assistant | Admitting: Physician Assistant

## 2024-02-25 ENCOUNTER — Encounter: Payer: Self-pay | Admitting: Physician Assistant

## 2024-02-25 VITALS — BP 128/74 | HR 84 | Ht 63.0 in | Wt 178.8 lb

## 2024-02-25 DIAGNOSIS — I25810 Atherosclerosis of coronary artery bypass graft(s) without angina pectoris: Secondary | ICD-10-CM | POA: Diagnosis not present

## 2024-02-25 DIAGNOSIS — I5042 Chronic combined systolic (congestive) and diastolic (congestive) heart failure: Secondary | ICD-10-CM | POA: Diagnosis not present

## 2024-02-25 DIAGNOSIS — I10 Essential (primary) hypertension: Secondary | ICD-10-CM

## 2024-02-25 DIAGNOSIS — I480 Paroxysmal atrial fibrillation: Secondary | ICD-10-CM

## 2024-02-25 MED ORDER — FUROSEMIDE 40 MG PO TABS: 40.0000 mg | ORAL_TABLET | Freq: Every day | ORAL | Status: DC | PRN

## 2024-02-25 MED ORDER — POTASSIUM CHLORIDE CRYS ER 20 MEQ PO TBCR
20.0000 meq | EXTENDED_RELEASE_TABLET | ORAL | 1 refills | Status: DC | PRN
Start: 2024-02-25 — End: 2024-08-05

## 2024-02-25 MED ORDER — METOPROLOL SUCCINATE ER 25 MG PO TB24: 25.0000 mg | ORAL_TABLET | Freq: Every day | ORAL | 3 refills | Status: DC

## 2024-02-25 NOTE — Patient Instructions (Addendum)
 Medication Instructions:  Restart lasix  40mg  daily for 3 days, then change to as needed.   Also, take the potassium 20meq on the days you have to take the Lasix .  *If you need a refill on your cardiac medications before your next appointment, please call your pharmacy*   Lab Work: Return in one week for................. BMET. YOU DO NOT NEED TO BE FASTING FOR THIS TEST.  If you have labs (blood work) drawn today and your tests are completely normal, you will receive your results only by: MyChart Message (if you have MyChart) OR A paper copy in the mail If you have any lab test that is abnormal or we need to change your treatment, we will call you to review the results.   Testing/Procedures: No procedures were ordered during today's visit.    Follow-Up: At [facility], you and your health needs are our priority.  As part of our continuing mission to provide you with exceptional heart care, we have created designated Provider Care Teams.  These Care Teams include your primary Cardiologist (physician) and Advanced Practice Providers (APPs -  Physician Assistants and Nurse Practitioners) who all work together to provide you with the care you need, when you need it.  We recommend signing up for the patient portal called MyChart.  Sign up information is provided on this After Visit Summary.  MyChart is used to connect with patients for Virtual Visits (Telemedicine).  Patients are able to view lab/test results, encounter notes, upcoming appointments, etc.  Non-urgent messages can be sent to your provider as well.   To learn more about what you can do with MyChart, go to [email or web address].    Your next appointment:   2 month(s)  Provider:   Ferrell Hu  Then, Alexandria Angel, MD will plan to see you again in 5 month(s).    Other Instructions Thank you for choosing [facility]!

## 2024-02-25 NOTE — Progress Notes (Signed)
 Cardiology Office Note   Date:  02/27/2024  ID:  Monica Zamora, DOB 13-Mar-1951, MRN [id number] PCP: Fonda Hymen, NP  [facility] Providers Cardiologist:  Alexandria Angel, MD     History of Present Illness Monica Zamora is a 73 y.o. female with past medical history of CAD s/p CABG 2021, carotid artery disease, CHF, atrial fibrillation and hypertension.  Carotid Doppler in June 2024 showed 60 to 79% right ICA disease, 40 to 59% left ICA disease.  She was admitted in July 2024 with CHF exacerbation, she was also noted to be in atrial fibrillation at the time.  ABI in July 2024 showed a moderate right and left disease.  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  Cardiac catheterization in July 2024 showed occluded RCA, occluded left circumflex, 60% followed by 70% LAD lesion, 55% PDA lesion and patent LIMA-LAD, patent SVG-OM 3 and patent SVG-PDA.  Renal Doppler showed 1 to 59% bilateral renal artery stenosis.  Transesophageal echocardiogram in July 2024 showed EF 30 to 35%, mild LVH, moderate RV dysfunction, mild RV enlargement, left atrial appendage s/p surgical clipping, mild right atrial enlargement, moderate to severe TR, mild MR.  Patient underwent cardioversion and was diuresed with improvement.  She was discharged on amiodarone . Patient was last seen by Dr. Audery Blazing in November 2024.  Since she has severe home O2 dependent COPD, therefore the decision was made to discontinue amiodarone .  She was continued on Eliquis .  It was felt that if she develop any recurrent atrial fibrillation in the future, Tikosyn may be a good option.  She is not on Farxiga  or Jardiance due to recurrent UTI. Echocardiogram obtained on 09/14/2023 showed EF 50 to 55%, no regional wall motion abnormality, low normal RV systolic function, no significant valve issue.    Blood work earlier this year showed  potassium of 6.3, her spironolactone  was discontinued.  I saw the patient on 11/30/2023 at which time she was already off of all 3 medication including Entresto , spironolactone  and the Lasix .  Creatinine peaked at 2.2 in February and came down after that.  I changed her Lasix  to as needed.  I resumed her pravastatin .  I treated her with a course of antibiotic due to UTI.  She presented to urgent care in April 2025 with weakness and multiple falls and it was hypotensive that day.  Blood work showed worsening renal function the creatinine up to 2.3.  She was instructed to go to the emergency room however left AMA.  CT of the head showed no acute finding.  CT of the cervical spine showed no acute fracture, degenerative changes of the neck.  Chest x-ray was normal.  Knee x-ray showed a small left knee effusion.  Urinalysis showed rare bacteria but negative nitrite.  I saw the patient in early May 2025 at which time family was worried patient has been getting weaker.  We found out she has resumed spironolactone  which we discontinued during that visit.  Repeat blood work obtained on 01/15/2024 showed her creatinine significantly improved to 1.31.  Limited echocardiogram obtained on 02/17/2024 showed EF 50 to 55%, low normal LV function, no regional wall motion abnormality, trivial MR.  She was recently seen by heart failure service on 02/04/2024 at which time she has just finished another course of antibiotic for UTI.  Recent carotid Doppler obtained on 02/17/2024 showed right ICA 60 to 79% stenosis,  left ICA 40 to 59% stenosis.  52-month repeat study was recommended.  Patient presents today for follow-up.  She is recovering well from the recent UTI.  She is volume overloaded on exam and has 2+ pitting edema in the left lower extremity and 1+ pitting edema in the right lower extremity.  She has been compliant with Eliquis .  Suspicion for lower extremity DVT is very low.  I will restart Lasix  at 40 mg daily for 3 days before  changing it to as needed thereafter.  I will also prescribe potassium 20 mEq as needed on the days she take Lasix .  She will return in 1 week for a basic metabolic panel to make sure her renal function is stable.  I will see the patient back in 2 months and she can follow-up with Dr. Audery Blazing in the 77-month.  ROS:   Patient complains of worsening lower extremity edema.  She also has dyspnea.  She denies any chest pain.  Studies Reviewed      Cardiac Studies & Procedures   ______________________________________________________________________________________________ CARDIAC CATHETERIZATION  CARDIAC CATHETERIZATION 03/17/2023  Conclusion   Prox RCA lesion is 100% stenosed.  Dist Cx lesion is 100% stenosed. -, RPDA lesion is 55% stenosed.   Prox LAD lesion is 60% stenosed.  Mid LAD lesion is 70% stenosed.   LIMA graft was visualized by angiography.   SVG-OM3 graft was visualized by angiography and is normal in caliber.  The graft exhibits no disease.   SVG-rPDA  graft was visualized by angiography and is large.  The graft exhibits no disease.   Hemodynamic findings consistent with moderate pulmonary hypertension with Severe RV Failure.   There is no aortic valve stenosis.  POST-CATH FINDINGS Severe combined CHF with severely elevated right-sided pressures: RAP mean 24 mmHg with large mild wave.; RV P-EDP 57/8-17 mmHg. PAP-mean 6/37-42 mmHg; PCWP 35 mmHg with V wave up to 43. Significant respiratory variation. LV P-EDP 118/16-22 mmHg, AO P-MAP 106/73-79 mmHg Ao sat 96, PA sat 54%. CO-CI (Fick) 4.51-2.35; PAPI 0.86 Significant native vessel CAD with 3 of 3 patent grafts: 100% proximal RCA CTO; 100% mLCx; Prox LAD 60% & mLAD ~70% (competetive flow from LIMA-LAD) Patent LIMA-LAD, SVG-PDA, SVG-OM 3   RECOMMENDATIONS: Return patient to nursing unit for ongoing care with IV diuresis => Heart Failure Service Consulted based on Severe RV Failure She was given 80 mg IV Lasix  in the Cath Lab With  the potential for having other invasive procedures including PICC line, I have written to restart IV heparin  2 hours after sheath removal.  Would convert back to DOAC once invasive procedures are completed.  Of note, she was in A-fib RVR with rates in the 100 120 bpm range.   Randene Bustard, MD  Findings Coronary Findings Diagnostic  Dominance: Right  Left Main Vessel was injected. Vessel is normal in caliber.  Left Anterior Descending Vessel is normal in caliber. Vessel is angiographically normal. Prox LAD lesion is 60% stenosed. Mid LAD lesion is 70% stenosed.  First Diagonal Branch Vessel is small in size.  First Septal Branch Vessel is small in size.  Second Diagonal Branch Vessel is small in size.  Second Septal Branch Vessel is small in size.  Left Circumflex Vessel is normal in caliber. Dist Cx lesion is 100% stenosed.  First Obtuse Marginal Branch Vessel is small in size.  Second Obtuse Marginal Branch Vessel is small in size.  Third Obtuse Marginal Branch Vessel is moderate in size.  Right Coronary Artery Vessel  was injected. Vessel is moderate in size. Prox RCA lesion is 100% stenosed.  Acute Marginal Branch Vessel is small in size.  Right Ventricular Branch Vessel is small in size.  Right Posterior Descending Artery RPDA lesion is 55% stenosed.  LIMA LIMA Graft To Dist LAD LIMA graft was visualized by angiography.  -dLAD  Saphenous Graft To 3rd Mrg SVG graft was visualized by angiography and is normal in caliber.  The graft exhibits no disease. OM3  Saphenous Graft To RPDA SVG graft was visualized by angiography and is large.  The graft exhibits no disease. SVG-PDA  Intervention  No interventions have been documented.     ECHOCARDIOGRAM  ECHOCARDIOGRAM LIMITED 02/17/2024  Narrative ECHOCARDIOGRAM LIMITED REPORT    Patient Name:   Monica Zamora Date of Exam: 02/17/2024 Medical Rec #:  [id number]           Height:       66.0  in Accession #:    [id number]          Weight:       174.6 lb Date of Birth:  05/17/1951           BSA:          1.888 m Patient Age:    73 years            BP:           162/65 mmHg Patient Gender: F                   HR:           77 bpm. Exam Location:  [facility]  Procedure: Limited Echo, Limited Color Doppler, Cardiac Doppler and 3D Echo (Both Spectral and Color Flow Doppler were utilized during procedure).  Indications:    R53.1 Weakness  History:        Patient has prior history of Echocardiogram examinations, most recent 09/14/2023. CAD, Prior CABG, Carotid Disease, Arrythmias:Atrial Fibrillation; Risk Factors:Hypertension.  Sonographer:    Juventino Oppenheim RCS Referring Phys: [phone] Amarrion Pastorino  IMPRESSIONS   1. Left ventricular ejection fraction, by estimation, is 50 to 55%. Left ventricular ejection fraction by 3D volume is 52 %. The left ventricle has low normal function. The left ventricle has no regional wall motion abnormalities. Left ventricular diastolic parameters were normal. 2. Right ventricular systolic function is normal. The right ventricular size is normal. There is normal pulmonary artery systolic pressure. 3. The mitral valve is normal in structure. Trivial mitral valve regurgitation. No evidence of mitral stenosis. 4. The aortic valve is tricuspid. Aortic valve regurgitation is not visualized. No aortic stenosis is present. 5. The inferior vena cava is normal in size with greater than 50% respiratory variability, suggesting right atrial pressure of 3 mmHg.  FINDINGS Left Ventricle: Left ventricular ejection fraction, by estimation, is 50 to 55%. Left ventricular ejection fraction by 3D volume is 52 %. The left ventricle has low normal function. The left ventricle has no regional wall motion abnormalities. The left ventricular internal cavity size was normal in size. There is no left ventricular hypertrophy. Left ventricular diastolic parameters were normal.  Normal left ventricular filling pressure.  Right Ventricle: The right ventricular size is normal. No increase in right ventricular wall thickness. Right ventricular systolic function is normal. There is normal pulmonary artery systolic pressure. The tricuspid regurgitant velocity is 2.36 m/s, and with an assumed right atrial pressure of 3 mmHg, the estimated right ventricular systolic pressure is 25.3 mmHg.  Left Atrium: Left atrial size was normal in size.  Right Atrium: Right atrial size was normal in size.  Pericardium: There is no evidence of pericardial effusion.  Mitral Valve: The mitral valve is normal in structure. Trivial mitral valve regurgitation. No evidence of mitral valve stenosis.  Tricuspid Valve: The tricuspid valve is normal in structure. Tricuspid valve regurgitation is trivial. No evidence of tricuspid stenosis.  Aortic Valve: The aortic valve is tricuspid. Aortic valve regurgitation is not visualized. No aortic stenosis is present.  Pulmonic Valve: The pulmonic valve was normal in structure. Pulmonic valve regurgitation is trivial. No evidence of pulmonic stenosis.  Aorta: The aortic root is normal in size and structure.  Venous: The inferior vena cava is normal in size with greater than 50% respiratory variability, suggesting right atrial pressure of 3 mmHg.  IAS/Shunts: No atrial level shunt detected by color flow Doppler.  LEFT VENTRICLE PLAX 2D LVIDd:         4.60 cm         Diastology LVIDs:         3.10 cm         LV e' medial:    11.00 cm/s LV PW:         1.00 cm         LV E/e' medial:  9.0 LV IVS:        0.90 cm         LV e' lateral:   11.60 cm/s LVOT diam:     2.00 cm         LV E/e' lateral: 8.5 LV SV:         65 LV SV Index:   34 LVOT Area:     3.14 cm        3D Volume EF LV 3D EF:    Left ventricul ar ejection fraction by 3D volume is 52 %.  3D Volume EF: 3D EF:        52 % LV EDV:       80 ml LV ESV:       39 ml LV SV:        42  ml  RIGHT VENTRICLE RVSP:           25.3 mmHg  LEFT ATRIUM         Index       RIGHT ATRIUM LA diam:    3.90 cm 2.07 cm/m  RA Pressure: 3.00 mmHg AORTIC VALVE LVOT Vmax:   94.20 cm/s LVOT Vmean:  62.100 cm/s LVOT VTI:    0.207 m  AORTA Ao Root diam: 3.20 cm Ao Asc diam:  3.30 cm  MITRAL VALVE                  TRICUSPID VALVE MV Area (PHT):                TR Peak grad:   22.3 mmHg MV Decel Time:                TR Vmax:        236.00 cm/s MR Peak grad:    87.2 mmHg    Estimated RAP:  3.00 mmHg MR Mean grad:    71.0 mmHg    RVSP:           25.3 mmHg MR Vmax:         467.00 cm/s MR Vmean:        404.0 cm/s   SHUNTS MR  PISA:         1.01 cm     Systemic VTI:  0.21 m MR PISA Eff ROA: 7 mm        Systemic Diam: 2.00 cm MR PISA Radius:  0.40 cm MV E velocity: 98.50 cm/s MV A velocity: 70.70 cm/s MV E/A ratio:  1.39  Maudine Sos MD Electronically signed by Maudine Sos MD Signature Date/Time: 02/18/2024/12:09:12 PM    Final   TEE  ECHO TEE 03/20/2023  Narrative TRANSESOPHOGEAL ECHO REPORT    Patient Name:   Monica Zamora Date of Exam: 03/20/2023 Medical Rec #:  [id number]           Height:       66.0 in Accession #:    [id number]          Weight:       165.3 lb Date of Birth:  [birth year]           BSA:          1.844 m Patient Age:    72 years            BP:           108/70 mmHg Patient Gender: F                   HR:           69 bpm. Exam Location:  Inpatient  Procedure: Transesophageal Echo, Color Doppler and Cardiac Doppler  Indications:     I48.91* Unspecified atrial fibrillation  History:         Patient has prior history of Echocardiogram examinations, most recent 03/18/2023. CAD, Prior CABG, COPD, Arrythmias:Atrial Fibrillation; Risk Factors:Hypertension and Dyslipidemia.  Sonographer:     Sherline Distel Senior RDCS Referring Phys:  [id number] Bernetta Brilliant [facility] Diagnosing Phys: Archer Bear  PROCEDURE: After discussion of the risks and benefits of a  TEE, an informed consent was obtained from the patient. The transesophogeal probe was passed without difficulty through the esophogus of the patient. Sedation performed by different physician. The patient was monitored while under deep sedation. Anesthestetic sedation was provided intravenously by Anesthesiology: 108mg  of Propofol , 80mg  of Lidocaine . The patient developed no complications during the procedure. A successful direct current cardioversion was performed at 200 joules with 1 attempt.  IMPRESSIONS   1. Left ventricular ejection fraction, by estimation, is 30 to 35%. The left ventricle has moderately decreased function. The left ventricle demonstrates global hypokinesis. The left ventricular internal cavity size was mildly dilated. 2. Right ventricular systolic function is moderately reduced. The right ventricular size is mildly enlarged. There is mildly elevated pulmonary artery systolic pressure. The estimated right ventricular systolic pressure is 42.0 mmHg. 3. No LA thrombus. No LA appendage was present (s/p surgical clipping). Left atrial size was mildly dilated. 4. No PFO or ASD by color doppler. 5. There was lipomatous atria septal hypertrophy. 6. Right atrial size was mildly dilated. 7. The tricuspid valve is abnormal. Tricuspid valve regurgitation is moderate to severe. 8. The mitral valve is normal in structure. Mild mitral valve regurgitation. No evidence of mitral stenosis. 9. The aortic valve is tricuspid. Aortic valve regurgitation is not visualized. No aortic stenosis is present. 10. The inferior vena cava is dilated in size with <50% respiratory variability, suggesting right atrial pressure of 15 mmHg. 11. Normal caliber thoracic aorta with grade 3 plaque in the descending thoracic aorta.  FINDINGS Left Ventricle: Left ventricular ejection fraction, by estimation, is 30 to 35%.  The left ventricle has moderately decreased function. The left ventricle demonstrates global  hypokinesis. The left ventricular internal cavity size was mildly dilated. There is no left ventricular hypertrophy.  Right Ventricle: The right ventricular size is mildly enlarged. No increase in right ventricular wall thickness. Right ventricular systolic function is moderately reduced. There is mildly elevated pulmonary artery systolic pressure. The tricuspid regurgitant velocity is 2.60 m/s, and with an assumed right atrial pressure of 15 mmHg, the estimated right ventricular systolic pressure is 42.0 mmHg.  Left Atrium: No LA thrombus. No LA appendage was present (s/p surgical clipping). Left atrial size was mildly dilated. No left atrial/left atrial appendage thrombus was detected.  Right Atrium: Right atrial size was mildly dilated.  Pericardium: There is no evidence of pericardial effusion.  Mitral Valve: The mitral valve is normal in structure. Mild mitral valve regurgitation. No evidence of mitral valve stenosis.  Tricuspid Valve: The tricuspid valve is abnormal. Tricuspid valve regurgitation is moderate to severe.  Aortic Valve: The aortic valve is tricuspid. Aortic valve regurgitation is not visualized. No aortic stenosis is present.  Pulmonic Valve: The pulmonic valve was normal in structure. Pulmonic valve regurgitation is not visualized.  Aorta: The aortic root is normal in size and structure.  Venous: The inferior vena cava is dilated in size with less than 50% respiratory variability, suggesting right atrial pressure of 15 mmHg.  IAS/Shunts: No PFO or ASD by color doppler.  Additional Comments: Spectral Doppler performed.  TRICUSPID VALVE TR Peak grad:   27.0 mmHg TR Vmax:        260.00 cm/s  Dalton McleanMD Electronically signed by Archer Bear Signature Date/Time: 03/20/2023/6:24:37 PM    Final (Updated)        ______________________________________________________________________________________________      Risk  Assessment/Calculations  CHA2DS2-VASc Score = 5   This indicates a 7.2% annual risk of stroke. The patient's score is based upon: CHF History: 1 HTN History: 1 Diabetes History: 0 Stroke History: 0 Vascular Disease History: 1 Age Score: 1 Gender Score: 1           Physical Exam VS:  BP 128/74   Pulse 84   Ht 5' 3 (1.6 m)   Wt 178 lb 12.8 oz (81.1 kg)   SpO2 91%   BMI 31.67 kg/m    Wt Readings from Last 3 Encounters:  02/25/24 178 lb 12.8 oz (81.1 kg)  02/04/24 174 lb 9.6 oz (79.2 kg)  01/15/24 181 lb (82.1 kg)    GEN: Well nourished, well developed in no acute distress NECK: No JVD; No carotid bruits CARDIAC: RRR, no murmurs, rubs, gallops RESPIRATORY:  Clear to auscultation without rales, wheezing or rhonchi  ABDOMEN: Soft, non-tender, non-distended EXTREMITIES:  No edema; No deformity   ASSESSMENT AND PLAN  Chronic systolic and diastolic heart failure: Ejection fraction was 35 to 40% in July 2024.  EF improved on repeat echocardiogram.  Most recent echocardiogram obtained on 02/17/2024 showed EF 50 to 55%.  She has not taken her Lasix  recently.  She is clearly volume overloaded.  I will restart her Lasix  at 40 mg daily for 3 days before changing it to as needed thereafter.  I also prescribed as needed potassium to be taken on the same day she take her Lasix .  Repeat basic metabolic panel in 1 week.  CAD s/p CABG 2021: Denies any recent chest pain.   Paroxysmal atrial fibrillation: On Eliquis .  Hypertension: Blood pressure stable.        Dispo: Follow-up  in 2 months with me and follow-up in 5 months with Dr. Audery Blazing.  Signed, Ervin Heath, PA

## 2024-03-03 ENCOUNTER — Telehealth: Payer: Self-pay | Admitting: Physician Assistant

## 2024-03-03 NOTE — Telephone Encounter (Signed)
 Spoke with Pt. Pt states her foot is the same just a little less swollen and would like to know if she can take additional Lasix  daily. Told her I would route to provider for recommendations. Pt stated understanding

## 2024-03-03 NOTE — Telephone Encounter (Signed)
 If she is now back on PRN dosing of lasix  and has not been taking lasix  for the past few days, let's do a scheduled 20 mg ( or 1/2 tablet of the 40mg  ) lasix  every day with optional 20mg  additional dose as needed. Repeat BMET in 1 week to make sure her kidney function is tolerating the dose adjustment. She need to take 1/2 tablet of Potassium daily as well.

## 2024-03-03 NOTE — Telephone Encounter (Signed)
 During the last visit, she was instructed to take lasix  at 40mg  daily for 3 days, then as needed after that. Is she back to PRN at this point?

## 2024-03-03 NOTE — Telephone Encounter (Signed)
 Pt c/o swelling/edema: STAT if pt has developed SOB within 24 hours  If swelling, where is the swelling located? Left foot, pt states she was seen recently by [facility] and was told to take fluid pill but pt is still having swelling with only slight improvement  How much weight have you gained and in what time span? unsure  Have you gained 2 pounds in a day or 5 pounds in a week? unsure  Do you have a log of your daily weights (if so, list)? No  Are you currently taking a fluid pill? yes  Are you currently SOB? No  Have you traveled recently in a car or plane for an extended period of time? No

## 2024-03-04 NOTE — Telephone Encounter (Signed)
 Called and spoke w patient.  She has continued to take lasix  40 mg daily and 20 meq potassium chloride  daily.  This is because the swelling has not gone away.  The swelling is not worse, but the same/present.  It is better in the morning at first waking up but not for long after waking.  This is left foot/ankle swelling and at times a little above ankle as well.    Elevates while sitting.    Asked her to come in on Monday for blood work bmet.  She will have to work on getting a ride.  Will call if she is unable.  There is already a current BMET order.

## 2024-03-08 LAB — BASIC METABOLIC PANEL WITH GFR
BUN/Creatinine Ratio: 23 (ref 12–28)
BUN: 34 mg/dL — ABNORMAL HIGH (ref 8–27)
CO2: 23 mmol/L (ref 20–29)
Calcium: 9.3 mg/dL (ref 8.7–10.3)
Chloride: 103 mmol/L (ref 96–106)
Creatinine, Ser: 1.48 mg/dL — ABNORMAL HIGH (ref 0.57–1.00)
Glucose: 98 mg/dL (ref 70–99)
Potassium: 5.3 mmol/L — ABNORMAL HIGH (ref 3.5–5.2)
Sodium: 142 mmol/L (ref 134–144)
eGFR: 37 mL/min/{1.73_m2} — ABNORMAL LOW (ref >59)

## 2024-03-10 ENCOUNTER — Ambulatory Visit: Payer: Self-pay | Admitting: Physician Assistant

## 2024-03-11 ENCOUNTER — Other Ambulatory Visit: Payer: Self-pay

## 2024-03-11 DIAGNOSIS — I2510 Atherosclerotic heart disease of native coronary artery without angina pectoris: Secondary | ICD-10-CM

## 2024-04-26 ENCOUNTER — Encounter: Payer: Self-pay | Admitting: Physician Assistant

## 2024-04-26 ENCOUNTER — Ambulatory Visit: Attending: Cardiology | Admitting: Physician Assistant

## 2024-04-26 VITALS — BP 130/68 | HR 83 | Ht 63.0 in | Wt 169.0 lb

## 2024-04-26 DIAGNOSIS — E785 Hyperlipidemia, unspecified: Secondary | ICD-10-CM

## 2024-04-26 DIAGNOSIS — R600 Localized edema: Secondary | ICD-10-CM | POA: Diagnosis not present

## 2024-04-26 DIAGNOSIS — I480 Paroxysmal atrial fibrillation: Secondary | ICD-10-CM

## 2024-04-26 DIAGNOSIS — I10 Essential (primary) hypertension: Secondary | ICD-10-CM

## 2024-04-26 DIAGNOSIS — I2510 Atherosclerotic heart disease of native coronary artery without angina pectoris: Secondary | ICD-10-CM

## 2024-04-26 DIAGNOSIS — I25810 Atherosclerosis of coronary artery bypass graft(s) without angina pectoris: Secondary | ICD-10-CM | POA: Diagnosis not present

## 2024-04-26 NOTE — Patient Instructions (Signed)
 Medication Instructions:  NO CHANGES  Lab Work: BMET TO BE DONE TODAY.   Testing/Procedures: NONE  Follow-Up: At [facility], you and your health needs are our priority.  As part of our continuing mission to provide you with exceptional heart care, our providers are all part of one team.  This team includes your primary Cardiologist (physician) and Advanced Practice Providers or APPs (Physician Assistants and Nurse Practitioners) who all work together to provide you with the care you need, when you need it.  Your next appointment:   4-6 MONTHS   Provider:   Redell Shallow, MD    We recommend signing up for the patient portal called MyChart.  Sign up information is provided on this After Visit Summary.  MyChart is used to connect with patients for Virtual Visits (Telemedicine).  Patients are able to view lab/test results, encounter notes, upcoming appointments, etc.  Non-urgent messages can be sent to your provider as well.   To learn more about what you can do with MyChart, go to [email or web address].

## 2024-04-26 NOTE — Progress Notes (Signed)
 Cardiology Office Note   Date:  04/28/2024  ID:  Monica Zamora, DOB 1950/12/31, MRN [id number] PCP: [name], NP  [facility] Providers Cardiologist:  Redell Shallow, MD     History of Present Illness Monica Zamora is a 73 y.o. female with past medical history of CAD s/p CABG 2021, carotid artery disease, CHF, atrial fibrillation and hypertension.  Carotid Doppler in June 2024 showed 60 to 79% right ICA disease, 40 to 59% left ICA disease.  She was admitted in July 2024 with CHF exacerbation, she was also noted to be in atrial fibrillation at the time.  ABI in July 2024 showed moderate right and left disease.  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  Cardiac catheterization in July 2024 showed occluded RCA, occluded left circumflex, 60% followed by 70% LAD lesion, 55% PDA lesion and patent LIMA-LAD, patent SVG-OM 3 and patent SVG-PDA.  Renal Doppler showed 1 to 59% bilateral renal artery stenosis.  Transesophageal echocardiogram in July 2024 showed EF 30 to 35%, mild LVH, moderate RV dysfunction, mild RV enlargement, left atrial appendage s/p surgical clipping, mild right atrial enlargement, moderate to severe TR, mild MR.  Patient underwent cardioversion and was diuresed with improvement.  She was discharged on amiodarone . Patient was last seen by Dr. Shallow in November 2024.  Since she has severe home O2 dependent COPD, therefore the decision was made to discontinue amiodarone .  She was continued on Eliquis .  It was felt that if she develop any recurrent atrial fibrillation in the future, Tikosyn may be a good option.  She is not on Farxiga  or Jardiance due to recurrent UTI. Echocardiogram obtained on 09/14/2023 showed EF 50 to 55%, no regional wall motion abnormality, low normal RV systolic function, no significant valve issue.    Blood work earlier this year showed potassium  of 6.3, her spironolactone  was discontinued.  I saw the patient on 11/30/2023 at which time she was already off of all 3 medication including Entresto , spironolactone  and Lasix .  Creatinine peaked at 2.2 in February and came down after that.  I changed her Lasix  to as needed.  I resumed her pravastatin .  I treated her with a course of antibiotic due to UTI.  She presented to urgent care in April 2025 with weakness and multiple falls and it was hypotensive that day.  Blood work showed worsening renal function the creatinine up to 2.3.  She was instructed to go to the emergency room however left AMA.  CT of the head showed no acute finding.  CT of the cervical spine showed no acute fracture, degenerative changes of the neck.  Chest x-ray was normal.  Knee x-ray showed a small left knee effusion.  Urinalysis showed rare bacteria but negative nitrite.   I saw the patient in early May 2025 at which time family was worried patient has been getting weaker.  We found out she has resumed spironolactone  which we discontinued during that visit.  Repeat blood work obtained on 01/15/2024 showed her creatinine significantly improved to 1.31.  Limited echocardiogram obtained on 02/17/2024 showed EF 50 to 55%, low normal LV function, no regional wall motion abnormality, trivial MR.  She was recently seen by heart failure service on 02/04/2024 at which time she has just finished another course of antibiotic for UTI.  Recent carotid Doppler obtained on 02/17/2024 showed right ICA 60 to 79% stenosis, left  ICA 40 to 59% stenosis.  55-month repeat study was recommended.  Past patient was last seen in the office in June 2025 at which time she appears to be volume overloaded with 2+ pitting edema in the left lower extremity and 1+ pitting edema in the right lower extremity.  I restarted Lasix  40 mg daily for 3 days before changing to as needed after.  I also added potassium supplement as well.  Repeat basic metabolic panel a month ago showed  creatinine of 1.48.  Patient presents today for follow-up.  She denies any chest discomfort, she gets fatigued very easily however I think she is very deconditioned.  Her weight today is 169 pounds, down from the previous 178 pounds.  According to the son, she was given daily dosing of Lasix  for 1 week before transition to Lasix  every third day.  She appears to be euvolemic on exam.  I will repeat a basic metabolic panel. She can follow-up with Dr. Pietro in 4 to 6 months.   ROS:   She denies chest pain, palpitations, dyspnea, pnd, orthopnea, n, v, dizziness, syncope, edema, weight gain, or early satiety. All other systems reviewed and are otherwise negative except as noted above.    Studies Reviewed      Cardiac Studies & Procedures   ______________________________________________________________________________________________ CARDIAC CATHETERIZATION  CARDIAC CATHETERIZATION 03/17/2023  Conclusion   Prox RCA lesion is 100% stenosed.  Dist Cx lesion is 100% stenosed. -, RPDA lesion is 55% stenosed.   Prox LAD lesion is 60% stenosed.  Mid LAD lesion is 70% stenosed.   LIMA graft was visualized by angiography.   SVG-OM3 graft was visualized by angiography and is normal in caliber.  The graft exhibits no disease.   SVG-rPDA  graft was visualized by angiography and is large.  The graft exhibits no disease.   Hemodynamic findings consistent with moderate pulmonary hypertension with Severe RV Failure.   There is no aortic valve stenosis.  POST-CATH FINDINGS Severe combined CHF with severely elevated right-sided pressures: RAP mean 24 mmHg with large mild wave.; RV P-EDP 57/8-17 mmHg. PAP-mean 6/37-42 mmHg; PCWP 35 mmHg with V wave up to 43. Significant respiratory variation. LV P-EDP 118/16-22 mmHg, AO P-MAP 106/73-79 mmHg Ao sat 96, PA sat 54%. CO-CI (Fick) 4.51-2.35; PAPI 0.86 Significant native vessel CAD with 3 of 3 patent grafts: 100% proximal RCA CTO; 100% mLCx; Prox LAD 60% & mLAD  ~70% (competetive flow from LIMA-LAD) Patent LIMA-LAD, SVG-PDA, SVG-OM 3   RECOMMENDATIONS: Return patient to nursing unit for ongoing care with IV diuresis => Heart Failure Service Consulted based on Severe RV Failure She was given 80 mg IV Lasix  in the Cath Lab With the potential for having other invasive procedures including PICC line, I have written to restart IV heparin  2 hours after sheath removal.  Would convert back to DOAC once invasive procedures are completed.  Of note, she was in A-fib RVR with rates in the 100 120 bpm range.   Alm Clay, MD  Findings Coronary Findings Diagnostic  Dominance: Right  Left Main Vessel was injected. Vessel is normal in caliber.  Left Anterior Descending Vessel is normal in caliber. Vessel is angiographically normal. Prox LAD lesion is 60% stenosed. Mid LAD lesion is 70% stenosed.  First Diagonal Branch Vessel is small in size.  First Septal Branch Vessel is small in size.  Second Diagonal Branch Vessel is small in size.  Second Septal Branch Vessel is small in size.  Left Circumflex Vessel is normal  in caliber. Dist Cx lesion is 100% stenosed.  First Obtuse Marginal Branch Vessel is small in size.  Second Obtuse Marginal Branch Vessel is small in size.  Third Obtuse Marginal Branch Vessel is moderate in size.  Right Coronary Artery Vessel was injected. Vessel is moderate in size. Prox RCA lesion is 100% stenosed.  Acute Marginal Branch Vessel is small in size.  Right Ventricular Branch Vessel is small in size.  Right Posterior Descending Artery RPDA lesion is 55% stenosed.  LIMA LIMA Graft To Dist LAD LIMA graft was visualized by angiography.  -dLAD  Saphenous Graft To 3rd Mrg SVG graft was visualized by angiography and is normal in caliber.  The graft exhibits no disease. OM3  Saphenous Graft To RPDA SVG graft was visualized by angiography and is large.  The graft exhibits no disease.  SVG-PDA  Intervention  No interventions have been documented.     ECHOCARDIOGRAM  ECHOCARDIOGRAM LIMITED 02/17/2024  Narrative ECHOCARDIOGRAM LIMITED REPORT    Patient Name:   Monica Zamora Date of Exam: 02/17/2024 Medical Rec #:  [id number]           Height:       66.0 in Accession #:    [id number]          Weight:       174.6 lb Date of Birth:  09/14/1951           BSA:          1.888 m Patient Age:    73 years            BP:           162/65 mmHg Patient Gender: F                   HR:           77 bpm. Exam Location:  [facility]  Procedure: Limited Echo, Limited Color Doppler, Cardiac Doppler and 3D Echo (Both Spectral and Color Flow Doppler were utilized during procedure).  Indications:    R53.1 Weakness  History:        Patient has prior history of Echocardiogram examinations, most recent 09/14/2023. CAD, Prior CABG, Carotid Disease, Arrythmias:Atrial Fibrillation; Risk Factors:Hypertension.  Sonographer:    Waldo Guadalajara RCS Referring Phys: [phone] Shamiah Kahler  IMPRESSIONS   1. Left ventricular ejection fraction, by estimation, is 50 to 55%. Left ventricular ejection fraction by 3D volume is 52 %. The left ventricle has low normal function. The left ventricle has no regional wall motion abnormalities. Left ventricular diastolic parameters were normal. 2. Right ventricular systolic function is normal. The right ventricular size is normal. There is normal pulmonary artery systolic pressure. 3. The mitral valve is normal in structure. Trivial mitral valve regurgitation. No evidence of mitral stenosis. 4. The aortic valve is tricuspid. Aortic valve regurgitation is not visualized. No aortic stenosis is present. 5. The inferior vena cava is normal in size with greater than 50% respiratory variability, suggesting right atrial pressure of 3 mmHg.  FINDINGS Left Ventricle: Left ventricular ejection fraction, by estimation, is 50 to 55%. Left ventricular ejection  fraction by 3D volume is 52 %. The left ventricle has low normal function. The left ventricle has no regional wall motion abnormalities. The left ventricular internal cavity size was normal in size. There is no left ventricular hypertrophy. Left ventricular diastolic parameters were normal. Normal left ventricular filling pressure.  Right Ventricle: The right ventricular size is normal. No increase in right  ventricular wall thickness. Right ventricular systolic function is normal. There is normal pulmonary artery systolic pressure. The tricuspid regurgitant velocity is 2.36 m/s, and with an assumed right atrial pressure of 3 mmHg, the estimated right ventricular systolic pressure is 25.3 mmHg.  Left Atrium: Left atrial size was normal in size.  Right Atrium: Right atrial size was normal in size.  Pericardium: There is no evidence of pericardial effusion.  Mitral Valve: The mitral valve is normal in structure. Trivial mitral valve regurgitation. No evidence of mitral valve stenosis.  Tricuspid Valve: The tricuspid valve is normal in structure. Tricuspid valve regurgitation is trivial. No evidence of tricuspid stenosis.  Aortic Valve: The aortic valve is tricuspid. Aortic valve regurgitation is not visualized. No aortic stenosis is present.  Pulmonic Valve: The pulmonic valve was normal in structure. Pulmonic valve regurgitation is trivial. No evidence of pulmonic stenosis.  Aorta: The aortic root is normal in size and structure.  Venous: The inferior vena cava is normal in size with greater than 50% respiratory variability, suggesting right atrial pressure of 3 mmHg.  IAS/Shunts: No atrial level shunt detected by color flow Doppler.  LEFT VENTRICLE PLAX 2D LVIDd:         4.60 cm         Diastology LVIDs:         3.10 cm         LV e' medial:    11.00 cm/s LV PW:         1.00 cm         LV E/e' medial:  9.0 LV IVS:        0.90 cm         LV e' lateral:   11.60 cm/s LVOT diam:     2.00  cm         LV E/e' lateral: 8.5 LV SV:         65 LV SV Index:   34 LVOT Area:     3.14 cm        3D Volume EF LV 3D EF:    Left ventricul ar ejection fraction by 3D volume is 52 %.  3D Volume EF: 3D EF:        52 % LV EDV:       80 ml LV ESV:       39 ml LV SV:        42 ml  RIGHT VENTRICLE RVSP:           25.3 mmHg  LEFT ATRIUM         Index       RIGHT ATRIUM LA diam:    3.90 cm 2.07 cm/m  RA Pressure: 3.00 mmHg AORTIC VALVE LVOT Vmax:   94.20 cm/s LVOT Vmean:  62.100 cm/s LVOT VTI:    0.207 m  AORTA Ao Root diam: 3.20 cm Ao Asc diam:  3.30 cm  MITRAL VALVE                  TRICUSPID VALVE MV Area (PHT):                TR Peak grad:   22.3 mmHg MV Decel Time:                TR Vmax:        236.00 cm/s MR Peak grad:    87.2 mmHg    Estimated RAP:  3.00 mmHg MR Mean grad:    71.0 mmHg  RVSP:           25.3 mmHg MR Vmax:         467.00 cm/s MR Vmean:        404.0 cm/s   SHUNTS MR PISA:         1.01 cm     Systemic VTI:  0.21 m MR PISA Eff ROA: 7 mm        Systemic Diam: 2.00 cm MR PISA Radius:  0.40 cm MV E velocity: 98.50 cm/s MV A velocity: 70.70 cm/s MV E/A ratio:  1.39  Annabella Scarce MD Electronically signed by Annabella Scarce MD Signature Date/Time: 02/18/2024/12:09:12 PM    Final   TEE  ECHO TEE 03/20/2023  Narrative TRANSESOPHOGEAL ECHO REPORT    Patient Name:   Monica Zamora Date of Exam: 03/20/2023 Medical Rec #:  [id number]           Height:       66.0 in Accession #:    [id number]          Weight:       165.3 lb Date of Birth:  Mar 19, 1951           BSA:          1.844 m Patient Age:    72 years            BP:           108/70 mmHg Patient Gender: F                   HR:           69 bpm. Exam Location:  Inpatient  Procedure: Transesophageal Echo, Color Doppler and Cardiac Doppler  Indications:     I48.91* Unspecified atrial fibrillation  History:         Patient has prior history of Echocardiogram examinations,  most recent 03/18/2023. CAD, Prior CABG, COPD, Arrythmias:Atrial Fibrillation; Risk Factors:Hypertension and Dyslipidemia.  Sonographer:     Damien Senior RDCS Referring Phys:  [phone] MANUELITA GARRE [facility] Diagnosing Phys: Ezra Kanner  PROCEDURE: After discussion of the risks and benefits of a TEE, an informed consent was obtained from the patient. The transesophogeal probe was passed without difficulty through the esophogus of the patient. Sedation performed by different physician. The patient was monitored while under deep sedation. Anesthestetic sedation was provided intravenously by Anesthesiology: 108mg  of Propofol , 80mg  of Lidocaine . The patient developed no complications during the procedure. A successful direct current cardioversion was performed at 200 joules with 1 attempt.  IMPRESSIONS   1. Left ventricular ejection fraction, by estimation, is 30 to 35%. The left ventricle has moderately decreased function. The left ventricle demonstrates global hypokinesis. The left ventricular internal cavity size was mildly dilated. 2. Right ventricular systolic function is moderately reduced. The right ventricular size is mildly enlarged. There is mildly elevated pulmonary artery systolic pressure. The estimated right ventricular systolic pressure is 42.0 mmHg. 3. No LA thrombus. No LA appendage was present (s/p surgical clipping). Left atrial size was mildly dilated. 4. No PFO or ASD by color doppler. 5. There was lipomatous atria septal hypertrophy. 6. Right atrial size was mildly dilated. 7. The tricuspid valve is abnormal. Tricuspid valve regurgitation is moderate to severe. 8. The mitral valve is normal in structure. Mild mitral valve regurgitation. No evidence of mitral stenosis. 9. The aortic valve is tricuspid. Aortic valve regurgitation is not visualized. No aortic stenosis is present. 10. The inferior vena cava is dilated in  size with <50% respiratory variability, suggesting right  atrial pressure of 15 mmHg. 11. Normal caliber thoracic aorta with grade 3 plaque in the descending thoracic aorta.  FINDINGS Left Ventricle: Left ventricular ejection fraction, by estimation, is 30 to 35%. The left ventricle has moderately decreased function. The left ventricle demonstrates global hypokinesis. The left ventricular internal cavity size was mildly dilated. There is no left ventricular hypertrophy.  Right Ventricle: The right ventricular size is mildly enlarged. No increase in right ventricular wall thickness. Right ventricular systolic function is moderately reduced. There is mildly elevated pulmonary artery systolic pressure. The tricuspid regurgitant velocity is 2.60 m/s, and with an assumed right atrial pressure of 15 mmHg, the estimated right ventricular systolic pressure is 42.0 mmHg.  Left Atrium: No LA thrombus. No LA appendage was present (s/p surgical clipping). Left atrial size was mildly dilated. No left atrial/left atrial appendage thrombus was detected.  Right Atrium: Right atrial size was mildly dilated.  Pericardium: There is no evidence of pericardial effusion.  Mitral Valve: The mitral valve is normal in structure. Mild mitral valve regurgitation. No evidence of mitral valve stenosis.  Tricuspid Valve: The tricuspid valve is abnormal. Tricuspid valve regurgitation is moderate to severe.  Aortic Valve: The aortic valve is tricuspid. Aortic valve regurgitation is not visualized. No aortic stenosis is present.  Pulmonic Valve: The pulmonic valve was normal in structure. Pulmonic valve regurgitation is not visualized.  Aorta: The aortic root is normal in size and structure.  Venous: The inferior vena cava is dilated in size with less than 50% respiratory variability, suggesting right atrial pressure of 15 mmHg.  IAS/Shunts: No PFO or ASD by color doppler.  Additional Comments: Spectral Doppler performed.  TRICUSPID VALVE TR Peak grad:   27.0 mmHg TR  Vmax:        260.00 cm/s  Dalton McleanMD Electronically signed by Ezra Kanner Signature Date/Time: 03/20/2023/6:24:37 PM    Final (Updated)        ______________________________________________________________________________________________      Risk Assessment/Calculations  CHA2DS2-VASc Score = 5   This indicates a 7.2% annual risk of stroke. The patient's score is based upon: CHF History: 1 HTN History: 1 Diabetes History: 0 Stroke History: 0 Vascular Disease History: 1 Age Score: 1 Gender Score: 1           Physical Exam VS:  BP 130/68   Pulse 83   Ht 5' 3 (1.6 m)   Wt 169 lb (76.7 kg)   SpO2 95%   BMI 29.94 kg/m        Wt Readings from Last 3 Encounters:  04/26/24 169 lb (76.7 kg)  02/25/24 178 lb 12.8 oz (81.1 kg)  02/04/24 174 lb 9.6 oz (79.2 kg)    GEN: Well nourished, well developed in no acute distress NECK: No JVD; No carotid bruits CARDIAC: RRR, no murmurs, rubs, gallops RESPIRATORY:  Clear to auscultation without rales, wheezing or rhonchi  ABDOMEN: Soft, non-tender, non-distended EXTREMITIES:  No edema; No deformity   ASSESSMENT AND PLAN  Leg edema: Patient appears to be euvolemic, lower extremity edema has completely resolved.  Patient's son has been giving her Lasix  once every 3 days.  Obtain basic metabolic panel  CAD s/p CABG: Denies any chest pain  PAF: Eliquis  and metoprolol  succinate  Hypertension: Blood pressure stable  Hyperlipidemia: on Pravastatin        Dispo: Follow-up with Dr. Pietro in 4 to 6 months.  Signed, Scot Ford, PA

## 2024-04-27 ENCOUNTER — Ambulatory Visit: Payer: Self-pay | Admitting: Physician Assistant

## 2024-04-27 LAB — BASIC METABOLIC PANEL WITH GFR
BUN/Creatinine Ratio: 19 (ref 12–28)
BUN: 33 mg/dL — ABNORMAL HIGH (ref 8–27)
CO2: 17 mmol/L — ABNORMAL LOW (ref 20–29)
Calcium: 9.4 mg/dL (ref 8.7–10.3)
Chloride: 106 mmol/L (ref 96–106)
Creatinine, Ser: 1.78 mg/dL — ABNORMAL HIGH (ref 0.57–1.00)
Glucose: 92 mg/dL (ref 70–99)
Potassium: 5.1 mmol/L (ref 3.5–5.2)
Sodium: 141 mmol/L (ref 134–144)
eGFR: 30 mL/min/1.73 — ABNORMAL LOW (ref >59)

## 2024-07-01 ENCOUNTER — Other Ambulatory Visit: Payer: Self-pay | Admitting: Acute Care

## 2024-07-01 DIAGNOSIS — Z87891 Personal history of nicotine dependence: Secondary | ICD-10-CM

## 2024-07-01 DIAGNOSIS — Z122 Encounter for screening for malignant neoplasm of respiratory organs: Secondary | ICD-10-CM

## 2024-07-27 ENCOUNTER — Emergency Department (HOSPITAL_COMMUNITY)

## 2024-07-27 ENCOUNTER — Inpatient Hospital Stay (HOSPITAL_COMMUNITY)
Admission: EM | Admit: 2024-07-27 | Discharge: 2024-08-02 | DRG: 309 | Disposition: A | Discharge status: Home Health | Attending: Infectious Diseases | Admitting: Infectious Diseases

## 2024-07-27 ENCOUNTER — Encounter (HOSPITAL_COMMUNITY): Payer: Self-pay

## 2024-07-27 ENCOUNTER — Other Ambulatory Visit: Payer: Self-pay

## 2024-07-27 DIAGNOSIS — E871 Hypo-osmolality and hyponatremia: Secondary | ICD-10-CM | POA: Diagnosis present

## 2024-07-27 DIAGNOSIS — Z7901 Long term (current) use of anticoagulants: Secondary | ICD-10-CM | POA: Diagnosis not present

## 2024-07-27 DIAGNOSIS — I130 Hypertensive heart and chronic kidney disease with heart failure and stage 1 through stage 4 chronic kidney disease, or unspecified chronic kidney disease: Secondary | ICD-10-CM | POA: Diagnosis present

## 2024-07-27 DIAGNOSIS — D539 Nutritional anemia, unspecified: Secondary | ICD-10-CM | POA: Diagnosis present

## 2024-07-27 DIAGNOSIS — I5020 Unspecified systolic (congestive) heart failure: Secondary | ICD-10-CM | POA: Diagnosis present

## 2024-07-27 DIAGNOSIS — Z7982 Long term (current) use of aspirin: Secondary | ICD-10-CM

## 2024-07-27 DIAGNOSIS — Z7983 Long term (current) use of bisphosphonates: Secondary | ICD-10-CM

## 2024-07-27 DIAGNOSIS — K921 Melena: Secondary | ICD-10-CM

## 2024-07-27 DIAGNOSIS — J9610 Chronic respiratory failure, unspecified whether with hypoxia or hypercapnia: Secondary | ICD-10-CM | POA: Insufficient documentation

## 2024-07-27 DIAGNOSIS — D631 Anemia in chronic kidney disease: Secondary | ICD-10-CM | POA: Diagnosis present

## 2024-07-27 DIAGNOSIS — D7589 Other specified diseases of blood and blood-forming organs: Secondary | ICD-10-CM | POA: Diagnosis present

## 2024-07-27 DIAGNOSIS — Z9049 Acquired absence of other specified parts of digestive tract: Secondary | ICD-10-CM

## 2024-07-27 DIAGNOSIS — Z23 Encounter for immunization: Secondary | ICD-10-CM

## 2024-07-27 DIAGNOSIS — Z90710 Acquired absence of both cervix and uterus: Secondary | ICD-10-CM

## 2024-07-27 DIAGNOSIS — E875 Hyperkalemia: Secondary | ICD-10-CM | POA: Diagnosis present

## 2024-07-27 DIAGNOSIS — D124 Benign neoplasm of descending colon: Secondary | ICD-10-CM | POA: Diagnosis present

## 2024-07-27 DIAGNOSIS — I4891 Unspecified atrial fibrillation: Secondary | ICD-10-CM

## 2024-07-27 DIAGNOSIS — D649 Anemia, unspecified: Secondary | ICD-10-CM | POA: Insufficient documentation

## 2024-07-27 DIAGNOSIS — I371 Nonrheumatic pulmonary valve insufficiency: Secondary | ICD-10-CM | POA: Diagnosis present

## 2024-07-27 DIAGNOSIS — Z87891 Personal history of nicotine dependence: Secondary | ICD-10-CM

## 2024-07-27 DIAGNOSIS — D122 Benign neoplasm of ascending colon: Secondary | ICD-10-CM | POA: Diagnosis present

## 2024-07-27 DIAGNOSIS — N1831 Chronic kidney disease, stage 3a: Secondary | ICD-10-CM | POA: Diagnosis present

## 2024-07-27 DIAGNOSIS — K922 Gastrointestinal hemorrhage, unspecified: Secondary | ICD-10-CM

## 2024-07-27 DIAGNOSIS — Z9981 Dependence on supplemental oxygen: Secondary | ICD-10-CM

## 2024-07-27 DIAGNOSIS — J449 Chronic obstructive pulmonary disease, unspecified: Secondary | ICD-10-CM | POA: Diagnosis present

## 2024-07-27 DIAGNOSIS — F1090 Alcohol use, unspecified, uncomplicated: Secondary | ICD-10-CM | POA: Diagnosis present

## 2024-07-27 DIAGNOSIS — I4819 Other persistent atrial fibrillation: Principal | ICD-10-CM | POA: Diagnosis present

## 2024-07-27 DIAGNOSIS — D62 Acute posthemorrhagic anemia: Secondary | ICD-10-CM | POA: Diagnosis present

## 2024-07-27 DIAGNOSIS — I3481 Nonrheumatic mitral (valve) annulus calcification: Secondary | ICD-10-CM | POA: Diagnosis present

## 2024-07-27 DIAGNOSIS — K2940 Chronic atrophic gastritis without bleeding: Secondary | ICD-10-CM | POA: Diagnosis present

## 2024-07-27 DIAGNOSIS — J441 Chronic obstructive pulmonary disease with (acute) exacerbation: Secondary | ICD-10-CM | POA: Diagnosis not present

## 2024-07-27 DIAGNOSIS — I959 Hypotension, unspecified: Secondary | ICD-10-CM | POA: Diagnosis not present

## 2024-07-27 DIAGNOSIS — Z79899 Other long term (current) drug therapy: Secondary | ICD-10-CM

## 2024-07-27 DIAGNOSIS — K5733 Diverticulitis of large intestine without perforation or abscess with bleeding: Secondary | ICD-10-CM | POA: Diagnosis present

## 2024-07-27 DIAGNOSIS — E785 Hyperlipidemia, unspecified: Secondary | ICD-10-CM | POA: Diagnosis present

## 2024-07-27 DIAGNOSIS — Z555 Less than a high school diploma: Secondary | ICD-10-CM

## 2024-07-27 DIAGNOSIS — Z87440 Personal history of urinary (tract) infections: Secondary | ICD-10-CM

## 2024-07-27 DIAGNOSIS — Z8711 Personal history of peptic ulcer disease: Secondary | ICD-10-CM

## 2024-07-27 DIAGNOSIS — J9611 Chronic respiratory failure with hypoxia: Secondary | ICD-10-CM | POA: Diagnosis present

## 2024-07-27 DIAGNOSIS — R079 Chest pain, unspecified: Secondary | ICD-10-CM | POA: Insufficient documentation

## 2024-07-27 DIAGNOSIS — I2781 Cor pulmonale (chronic): Secondary | ICD-10-CM | POA: Diagnosis present

## 2024-07-27 DIAGNOSIS — I5042 Chronic combined systolic (congestive) and diastolic (congestive) heart failure: Secondary | ICD-10-CM | POA: Diagnosis present

## 2024-07-27 DIAGNOSIS — Z951 Presence of aortocoronary bypass graft: Secondary | ICD-10-CM

## 2024-07-27 DIAGNOSIS — I2510 Atherosclerotic heart disease of native coronary artery without angina pectoris: Secondary | ICD-10-CM | POA: Diagnosis present

## 2024-07-27 DIAGNOSIS — I255 Ischemic cardiomyopathy: Secondary | ICD-10-CM | POA: Diagnosis present

## 2024-07-27 DIAGNOSIS — K633 Ulcer of intestine: Secondary | ICD-10-CM | POA: Diagnosis present

## 2024-07-27 LAB — I-STAT CHEM 8, ED
BUN: 35 mg/dL — ABNORMAL HIGH (ref 8–23)
Calcium, Ion: 1.09 mmol/L — ABNORMAL LOW (ref 1.15–1.40)
Chloride: 110 mmol/L (ref 98–111)
Creatinine, Ser: 1.3 mg/dL — ABNORMAL HIGH (ref 0.44–1.00)
Glucose, Bld: 74 mg/dL (ref 70–99)
HCT: 32 % — ABNORMAL LOW (ref 36.0–46.0)
Hemoglobin: 10.9 g/dL — ABNORMAL LOW (ref 12.0–15.0)
Potassium: 4.9 mmol/L (ref 3.5–5.1)
Sodium: 136 mmol/L (ref 135–145)
TCO2: 19 mmol/L — ABNORMAL LOW (ref 22–32)

## 2024-07-27 LAB — CBC WITH DIFFERENTIAL/PLATELET
Abs Immature Granulocytes: 0.03 K/uL (ref 0.00–0.07)
Basophils Absolute: 0.1 K/uL (ref 0.0–0.1)
Basophils Relative: 1 %
Eosinophils Absolute: 0.4 K/uL (ref 0.0–0.5)
Eosinophils Relative: 7 %
HCT: 31.6 % — ABNORMAL LOW (ref 36.0–46.0)
Hemoglobin: 9.9 g/dL — ABNORMAL LOW (ref 12.0–15.0)
Immature Granulocytes: 1 %
Lymphocytes Relative: 21 %
Lymphs Abs: 1.3 K/uL (ref 0.7–4.0)
MCH: 36.4 pg — ABNORMAL HIGH (ref 26.0–34.0)
MCHC: 31.3 g/dL (ref 30.0–36.0)
MCV: 116.2 fL — ABNORMAL HIGH (ref 80.0–100.0)
Monocytes Absolute: 0.4 K/uL (ref 0.1–1.0)
Monocytes Relative: 7 %
Neutro Abs: 3.9 K/uL (ref 1.7–7.7)
Neutrophils Relative %: 63 %
Platelets: 325 K/uL (ref 150–400)
RBC: 2.72 MIL/uL — ABNORMAL LOW (ref 3.87–5.11)
RDW: 15 % (ref 11.5–15.5)
Smear Review: NORMAL
WBC: 6.1 K/uL (ref 4.0–10.5)
nRBC: 0 % (ref 0.0–0.2)

## 2024-07-27 LAB — BASIC METABOLIC PANEL WITH GFR
Anion gap: 16 — ABNORMAL HIGH (ref 5–15)
Anion gap: 9 (ref 5–15)
BUN: 28 mg/dL — ABNORMAL HIGH (ref 8–23)
BUN: 20 mg/dL (ref 8–23)
CO2: 14 mmol/L — ABNORMAL LOW (ref 22–32)
CO2: 20 mmol/L — ABNORMAL LOW (ref 22–32)
Calcium: 8.5 mg/dL — ABNORMAL LOW (ref 8.9–10.3)
Calcium: 8.3 mg/dL — ABNORMAL LOW (ref 8.9–10.3)
Chloride: 106 mmol/L (ref 98–111)
Chloride: 104 mmol/L (ref 98–111)
Creatinine, Ser: 1.17 mg/dL — ABNORMAL HIGH (ref 0.44–1.00)
Creatinine, Ser: 1.07 mg/dL — ABNORMAL HIGH (ref 0.44–1.00)
GFR, Estimated: 50 mL/min — ABNORMAL LOW (ref >60)
GFR, Estimated: 55 mL/min — ABNORMAL LOW (ref >60)
Glucose, Bld: 74 mg/dL (ref 70–99)
Glucose, Bld: 122 mg/dL — ABNORMAL HIGH (ref 70–99)
Potassium: 5.2 mmol/L — ABNORMAL HIGH (ref 3.5–5.1)
Potassium: 4.3 mmol/L (ref 3.5–5.1)
Sodium: 136 mmol/L (ref 135–145)
Sodium: 133 mmol/L — ABNORMAL LOW (ref 135–145)

## 2024-07-27 LAB — HEPATIC FUNCTION PANEL
ALT: 10 U/L (ref 0–44)
AST: 22 U/L (ref 15–41)
Albumin: 3.2 g/dL — ABNORMAL LOW (ref 3.5–5.0)
Alkaline Phosphatase: 123 U/L (ref 38–126)
Bilirubin, Direct: 0.3 mg/dL — ABNORMAL HIGH (ref 0.0–0.2)
Indirect Bilirubin: 0.6 mg/dL (ref 0.3–0.9)
Total Bilirubin: 0.9 mg/dL (ref 0.0–1.2)
Total Protein: 6.5 g/dL (ref 6.5–8.1)

## 2024-07-27 LAB — ECHOCARDIOGRAM COMPLETE
Height: 63 in
MV M vel: 4.59 m/s
MV Peak grad: 84.3 mmHg
MV VTI: 1.59 cm2
S' Lateral: 3.8 cm
Weight: 2704 [oz_av]

## 2024-07-27 LAB — IRON AND TIBC
Iron: 81 ug/dL (ref 28–170)
Saturation Ratios: 31 % (ref 10.4–31.8)
TIBC: 258 ug/dL (ref 250–450)
UIBC: 177 ug/dL

## 2024-07-27 LAB — FOLATE: Folate: 14.1 ng/mL (ref >5.9)

## 2024-07-27 LAB — TSH: TSH: 2.524 u[IU]/mL (ref 0.350–4.500)

## 2024-07-27 LAB — TROPONIN I (HIGH SENSITIVITY)
Troponin I (High Sensitivity): 12 ng/L (ref <18)
Troponin I (High Sensitivity): 10 ng/L (ref <18)

## 2024-07-27 LAB — POC OCCULT BLOOD, ED: Fecal Occult Bld: POSITIVE — AB

## 2024-07-27 LAB — MAGNESIUM: Magnesium: 2 mg/dL (ref 1.7–2.4)

## 2024-07-27 LAB — BRAIN NATRIURETIC PEPTIDE: B Natriuretic Peptide: 1340.4 pg/mL — ABNORMAL HIGH (ref 0.0–100.0)

## 2024-07-27 LAB — FERRITIN: Ferritin: 100 ng/mL (ref 11–307)

## 2024-07-27 LAB — VITAMIN B12: Vitamin B-12: 360 pg/mL (ref 180–914)

## 2024-07-27 MED ORDER — AMIODARONE HCL IN DEXTROSE 360-4.14 MG/200ML-% IV SOLN: 30.0000 mg/h | INTRAVENOUS | Status: DC

## 2024-07-27 MED ORDER — LACTATED RINGERS IV BOLUS: 500.0000 mL | Freq: Once | INTRAVENOUS | Status: DC

## 2024-07-27 MED ORDER — NITROGLYCERIN 2 % TD OINT
0.5000 [in_us] | TOPICAL_OINTMENT | Freq: Once | TRANSDERMAL | Status: AC
  Administered 2024-07-27: 0.5 [in_us] via TOPICAL
  Filled 2024-07-27: qty 1

## 2024-07-27 MED ORDER — ASPIRIN 81 MG PO CHEW
324.0000 mg | CHEWABLE_TABLET | Freq: Once | ORAL | Status: AC
  Administered 2024-07-27: 324 mg via ORAL
  Filled 2024-07-27: qty 4

## 2024-07-27 MED ORDER — AMIODARONE LOAD VIA INFUSION
150.0000 mg | Freq: Once | INTRAVENOUS | Status: AC
  Administered 2024-07-27: 150 mg via INTRAVENOUS
  Filled 2024-07-27: qty 83.34

## 2024-07-27 MED ORDER — DILTIAZEM HCL-DEXTROSE 125-5 MG/125ML-% IV SOLN (PREMIX)
5.0000 mg/h | INTRAVENOUS | Status: DC
  Administered 2024-07-27 (×2): 5 mg/h via INTRAVENOUS
  Filled 2024-07-27: qty 125

## 2024-07-27 MED ORDER — DILTIAZEM HCL-DEXTROSE 125-5 MG/125ML-% IV SOLN (PREMIX)
5.0000 mg/h | INTRAVENOUS | Status: DC
  Administered 2024-07-27: 15 mg/h via INTRAVENOUS

## 2024-07-27 MED ORDER — MELATONIN 3 MG PO TABS
3.0000 mg | ORAL_TABLET | Freq: Every day | ORAL | Status: DC
  Administered 2024-07-27 – 2024-08-01 (×6): 3 mg via ORAL
  Filled 2024-07-27 (×6): qty 1

## 2024-07-27 MED ORDER — IPRATROPIUM BROMIDE 0.02 % IN SOLN: 0.5000 mg | RESPIRATORY_TRACT | Status: DC | PRN

## 2024-07-27 MED ORDER — SACUBITRIL-VALSARTAN 24-26 MG PO TABS
1.0000 | ORAL_TABLET | Freq: Two times a day (BID) | ORAL | Status: DC
  Administered 2024-07-27 – 2024-07-28 (×2): 1 via ORAL
  Filled 2024-07-27 (×2): qty 1

## 2024-07-27 MED ORDER — SODIUM ZIRCONIUM CYCLOSILICATE 10 G PO PACK
10.0000 g | PACK | Freq: Once | ORAL | Status: AC
  Administered 2024-07-27: 10 g via ORAL
  Filled 2024-07-27: qty 1

## 2024-07-27 MED ORDER — ACETAMINOPHEN 500 MG PO TABS
1000.0000 mg | ORAL_TABLET | Freq: Two times a day (BID) | ORAL | Status: DC | PRN
  Administered 2024-07-27 – 2024-08-01 (×5): 1000 mg via ORAL
  Filled 2024-07-27 (×5): qty 2

## 2024-07-27 MED ORDER — LIDOCAINE 5 % EX PTCH
1.0000 | MEDICATED_PATCH | Freq: Every day | CUTANEOUS | Status: DC | PRN
  Filled 2024-07-27: qty 1

## 2024-07-27 MED ORDER — PRAVASTATIN SODIUM 10 MG PO TABS
20.0000 mg | ORAL_TABLET | Freq: Every day | ORAL | Status: DC
  Administered 2024-07-27 – 2024-08-01 (×6): 20 mg via ORAL
  Filled 2024-07-27 (×6): qty 2

## 2024-07-27 MED ORDER — AMIODARONE HCL IN DEXTROSE 360-4.14 MG/200ML-% IV SOLN
60.0000 mg/h | INTRAVENOUS | Status: DC
  Administered 2024-07-27 (×2): 60 mg/h via INTRAVENOUS
  Filled 2024-07-27: qty 200

## 2024-07-27 MED ORDER — AMIODARONE HCL IN DEXTROSE 360-4.14 MG/200ML-% IV SOLN
30.0000 mg/h | INTRAVENOUS | Status: DC
  Administered 2024-07-27 – 2024-08-01 (×10): 30 mg/h via INTRAVENOUS
  Filled 2024-07-27 (×10): qty 200

## 2024-07-27 MED ORDER — AMIODARONE LOAD VIA INFUSION
150.0000 mg | Freq: Once | INTRAVENOUS | Status: DC
  Filled 2024-07-27: qty 83.34

## 2024-07-27 MED ORDER — INFLUENZA VAC SPLIT HIGH-DOSE 0.5 ML IM SUSY
0.5000 mL | PREFILLED_SYRINGE | INTRAMUSCULAR | Status: DC
  Filled 2024-07-27: qty 0.5

## 2024-07-27 MED ORDER — DILTIAZEM LOAD VIA INFUSION
10.0000 mg | Freq: Once | INTRAVENOUS | Status: AC
  Administered 2024-07-27: 10 mg via INTRAVENOUS
  Filled 2024-07-27: qty 10

## 2024-07-27 MED ORDER — PERFLUTREN LIPID MICROSPHERE
1.0000 mL | INTRAVENOUS | Status: AC | PRN
Start: 2024-07-27 — End: 2024-07-27
  Administered 2024-07-27: 3 mL via INTRAVENOUS

## 2024-07-27 MED ORDER — SODIUM CHLORIDE 0.9 % IV BOLUS
500.0000 mL | Freq: Once | INTRAVENOUS | Status: AC
  Administered 2024-07-27: 500 mL via INTRAVENOUS

## 2024-07-27 MED ORDER — FENTANYL CITRATE (PF) 50 MCG/ML IJ SOSY
50.0000 ug | PREFILLED_SYRINGE | Freq: Once | INTRAMUSCULAR | Status: AC
  Administered 2024-07-27: 50 ug via INTRAVENOUS
  Filled 2024-07-27: qty 1

## 2024-07-27 MED ORDER — AMIODARONE HCL IN DEXTROSE 360-4.14 MG/200ML-% IV SOLN: 60.0000 mg/h | INTRAVENOUS | Status: DC

## 2024-07-27 MED ORDER — ALBUTEROL SULFATE HFA 108 (90 BASE) MCG/ACT IN AERS: 1.0000 | INHALATION_SPRAY | Freq: Two times a day (BID) | RESPIRATORY_TRACT | Status: DC | PRN

## 2024-07-27 MED ORDER — BUDESONIDE 0.25 MG/2ML IN SUSP
0.2500 mg | Freq: Two times a day (BID) | RESPIRATORY_TRACT | Status: DC
  Administered 2024-07-28 – 2024-08-01 (×8): 0.25 mg via RESPIRATORY_TRACT
  Filled 2024-07-27 (×9): qty 2

## 2024-07-27 MED ORDER — PANTOPRAZOLE SODIUM 40 MG IV SOLR
40.0000 mg | Freq: Two times a day (BID) | INTRAVENOUS | Status: DC
  Administered 2024-07-27 – 2024-07-29 (×5): 40 mg via INTRAVENOUS
  Filled 2024-07-27 (×5): qty 10

## 2024-07-27 NOTE — ED Triage Notes (Signed)
 Pt BIB [facility] from home. R arm pain 3 weeks ago, 2 weeks ago noticed SHOB with exertion. Hx a fib RVR and cardioversion. Pt endorses heart racing. Symptoms present for about 2 weeks, worsening today. 2 L [place] baseline, on 4 L with EMS. No dizziness. Takes metoprolol  and blood thinner. Lasix  dose decreased in the past two weeks.   Rate 102-184, BP 144/86 manual, 97% 2 L   20g L wrist

## 2024-07-27 NOTE — ED Notes (Addendum)
 Cardizem ordered continued until cardiology able to come to bedside.  DO Kandis armin [facility] aware.

## 2024-07-27 NOTE — H&P (Signed)
 Date: 07/27/2024               Patient Name:  Monica Zamora MRN: [id number]  DOB: 06-28-1951 Age / Sex: 73 y.o., female   PCP: [name], NP         Medical Service: Internal Medicine Teaching Service         Attending Physician: Dr. Eben Reyes BROCKS, MD      First Contact: Intern on Call: [phone]       Second Contact: Resident on Call:[phone]                    SUBJECTIVE   Chief Complaint: Shortness of breath  History of Present Illness: Monica Zamora is a 73 year old female with a past medical history of Atrial fibrillation, HFimpEF (50-55% from 30-35% in July 2024), s/p CABG (2021), HTN, COPD (2L baseline), PUD, and alcohol use who presented to the ED for shortness of breath. Pt has had worsening shortness of breath over the last several weeks, and has been requiring oxygen at rest for about a month. She had a similar episode last year in July when she was found to be in A.Fib w/ RVR and required cardioversion. Per her close friend, she has a long history of drinking hard alcohol and has been drinking regularly recently. Has a 50+ pack year tobacco history. To help her shortness of breath, she uses her O2 at 2L at home and rests. Activity makes it worse. Also reports that walking around makes her lightheaded. Her valium prescription was discontinued about a month ago as well.  In regards to her HFrecEF, her last echocardiogram in June 2025 the LVEF was 50-55%. In July 2024, patient underwent a TEE with cardioversion due to atrial fibrillation, at that time her LVEF was 30-35%. She was also deemed not a LONG term amiodarone  candidate due to severe COPD on home oxygen.   Review of Systems: A complete ROS was negative except as per HPI.   ED Course:  In the ED, she was found to be in A.Fib with RVR and also had chest pain. Troponins and EKG were negative for infarction. Pt reports taking her Eliquis  this morning, and that she has recently had black tarry  stool. Her Hgb has also recently dropped from 10.9 to 9.9 over the last day.  For her A.Fib, she was given Diltiazem and NS. For her chest pain she was given fentanyl and nitroglycerin. She was also given Protonix.  Denies cough, fevers, chills, diarrhea, new orthopnea   Past Medical History: HFrecEF LVEF 50-55% July 2025. Cardioversion in June 2024. CABG 2021 PAF COPD, chronic hypoxic respiratory failure with 2L [place] HTN PUD  Meds:  Current Meds  Medication Sig   acetaminophen  (TYLENOL ) 500 MG tablet Take 1,000 mg by mouth 2 (two) times daily as needed (pain).   albuterol  (VENTOLIN  HFA) 108 (90 Base) MCG/ACT inhaler Inhale 1 puff into the lungs 2 (two) times daily as needed for wheezing or shortness of breath.   apixaban  (ELIQUIS ) 5 MG TABS tablet Take 1 tablet (5 mg total) by mouth 2 (two) times daily.   cyclobenzaprine (FLEXERIL) 10 MG tablet Take 10 mg by mouth at bedtime.   diazepam (VALIUM) 5 MG tablet Take 5 mg by mouth 2 (two) times daily as needed for anxiety.   furosemide  (LASIX ) 20 MG tablet Take 20 mg by mouth See admin instructions. Take 20mg  (1 tablet) by mouth every 5 days.   lidocaine  (  LIDODERM ) 5 % Place 1 patch onto the skin daily as needed (Pain).   metoprolol  succinate (TOPROL -XL) 25 MG 24 hr tablet Take 1 tablet (25 mg total) by mouth at bedtime. Take with or immediately following a meal.   potassium chloride  SA (KLOR-CON  M) 20 MEQ tablet Take 1 tablet (20 mEq total) by mouth as needed. Only take potassium on the day you take lasix  (Patient taking differently: Take 20 mEq by mouth See admin instructions. Take 20mEq (1 tablet) by mouth every 5 days.)   pravastatin  (PRAVACHOL ) 20 MG tablet Take 1 tablet (20 mg total) by mouth daily at 6 PM.   sacubitril -valsartan  (ENTRESTO ) 24-26 MG Take 1 tablet by mouth 2 (two) times daily.   Reports taking only the following medications: Metoprolol  25mg  at bedtime Lasix  20mg  once every 5 days (and Kcl 20mg  on those days) Eliquis  5mg   BID, last dose admission morning. Entresto  24-26mg  BID Pravastatin  20mg  at bedtime  Allergies: Allergies as of 07/27/2024   (No Known Allergies)    Past Surgical History:  Procedure Laterality Date   ABDOMINAL HYSTERECTOMY     CARDIOVERSION N/A 03/20/2023   Procedure: CARDIOVERSION;  Surgeon: Rolan Ezra RAMAN, MD;  Location: [facility] INVASIVE CV LAB;  Service: Cardiovascular;  Laterality: N/A;   CHOLECYSTECTOMY     CORONARY ARTERY BYPASS GRAFT     RIGHT/LEFT HEART CATH AND CORONARY/GRAFT ANGIOGRAPHY N/A 03/17/2023   Procedure: RIGHT/LEFT HEART CATH AND CORONARY/GRAFT ANGIOGRAPHY;  Surgeon: Anner Alm ORN, MD;  Location: [facility] INVASIVE CV LAB;  Service: Cardiovascular;  Laterality: N/A;   ruptured disc     SPINE SURGERY     TEE WITHOUT CARDIOVERSION N/A 03/20/2023   Procedure: TRANSESOPHAGEAL ECHOCARDIOGRAM;  Surgeon: Rolan Ezra RAMAN, MD;  Location: [facility] INVASIVE CV LAB;  Service: Cardiovascular;  Laterality: N/A;   Social:  Lives With: Her friend and friend's boyfriend. Support: Friend  Level of Function:Rolling walker, dependent with iADLs ERE:Yzfazmh, Comer GAILS, NP  Substances: Tobacco, 1-1.5ppd for ~45 years. History of heavy alcohol use, guarded. No etoh or drugs   OBJECTIVE:   Physical Exam: Blood pressure (!) 143/110, pulse (!) 137, temperature 97.8 F (36.6 C), temperature source Oral, resp. rate 19, height 5' 3 (1.6 m), weight 76.7 kg, SpO2 100%.   Physical Exam Vitals and nursing note reviewed. Exam conducted with a chaperone present.  Constitutional:      General: She is not in acute distress. Cardiovascular:     Rate and Rhythm: Normal rate. Rhythm irregular.     Heart sounds: No murmur heard. Pulmonary:     Effort: Pulmonary effort is normal.     Breath sounds: Rales present.     Comments: On 2L [place] Chest:     Chest wall: Tenderness (anterior chest wall tender to palpation) present.  Abdominal:     Palpations: Abdomen is soft.     Tenderness: There is no  abdominal tenderness.  Musculoskeletal:     Right lower leg: No edema.     Left lower leg: No edema.  Skin:    General: Skin is warm and dry.  Neurological:     Mental Status: She is alert.    Labs: CBC    Component Value Date/Time   WBC 6.1 07/27/2024 0930   RBC 2.72 (L) 07/27/2024 0930   HGB 10.9 (L) 07/27/2024 1235   HCT 32.0 (L) 07/27/2024 1235   PLT 325 07/27/2024 0930   MCV 116.2 (H) 07/27/2024 0930   MCH 36.4 (H) 07/27/2024 0930   MCHC  31.3 07/27/2024 0930   RDW 15.0 07/27/2024 0930   LYMPHSABS 1.3 07/27/2024 0930   MONOABS 0.4 07/27/2024 0930   EOSABS 0.4 07/27/2024 0930   BASOSABS 0.1 07/27/2024 0930    CMP     Component Value Date/Time   NA 136 07/27/2024 1235   NA 141 04/26/2024 1606   K 4.9 07/27/2024 1235   CL 110 07/27/2024 1235   CO2 14 (L) 07/27/2024 0930   GLUCOSE 74 07/27/2024 1235   BUN 35 (H) 07/27/2024 1235   BUN 33 (H) 04/26/2024 1606   CREATININE 1.30 (H) 07/27/2024 1235   CALCIUM  8.5 (L) 07/27/2024 0930   PROT 6.5 07/27/2024 0930   ALBUMIN 3.2 (L) 07/27/2024 0930   AST 22 07/27/2024 0930   ALT 10 07/27/2024 0930   ALKPHOS 123 07/27/2024 0930   BILITOT 0.9 07/27/2024 0930   GFRNONAA 50 (L) 07/27/2024 0930   Imaging: DG Chest 1 View Result Date: 07/27/2024 EXAM: 1 VIEW(S) XRAY OF THE CHEST 07/27/2024 09:40:51 AM COMPARISON: 01/06/2024 CLINICAL HISTORY: sob FINDINGS: LUNGS AND PLEURA: Minimal bibasilar subsegmental atelectasis or scarring is noted. No focal pulmonary opacity. No pulmonary edema. No pleural effusion. No pneumothorax. HEART AND MEDIASTINUM: Stable cardiomediastinal silhouette. Sternotomy wires are again noted. BONES AND SOFT TISSUES: Sternotomy wires are again noted. No acute osseous abnormality. IMPRESSION: 1. Minimal bibasilar subsegmental atelectasis or scarring. Electronically signed by: Lynwood Seip MD 07/27/2024 09:56 AM EST RP Workstation: [id number]      EKG: personally reviewed my interpretation is a.fib. Prior EKG  a.fib.  ASSESSMENT & PLAN:   Assessment & Plan by Problem: Principal Problem:   Atrial fibrillation with RVR (HCC) Active Problems:   COPD (chronic obstructive pulmonary disease) (HCC)   CKD stage 3a, GFR 45-59 ml/min (HCC)   Hyperkalemia   Melena   Anemia   Alcohol use   Chest pain   Chronic respiratory failure (HCC)   Anticoagulated   Monica Zamora is a 73 y.o. person living with a history of 73 year old female with a past medical history of Atrial fibrillation, HFimpEF (50-55% from 30-35% in July 2024), s/p CABG (2021), HTN, COPD (2L baseline), PUD, and alcohol use who presented to the ED for shortness of breath and admitted for Atrial Fibrilliation with RVR on hospital day 0  # Atrial Fibrillation with RVR #HFrEF LVEF 20-25%  # Chest Pain # CAD s/p CABG x3 2021 And patient presented to the emergency department with A-fib RVR and was started on a Cardizem drip for rate control.  On evaluation of patient's A-fib with RVR, echocardiogram was obtained which revealed a LVEF of 20-25% which is reduced from a LVEF of 50 to 55% in June 2025.  I suspect that patient's A-fib with RVR is due to her suspected GI bleed with evidence of melena and drop in hemoglobin.  And I suspect that her recent decrease in LVEF is due to uncontrolled A-fib RVR over this past month.  Patient is euvolemic on exam and has warm exam extremities, low concern for cardiogenic shock at this time. Plan: - Cardiology consulted, appreciate recs  - Stopped diltiazem  - Starting IV Amiodarone  bolus and drip, ok for short-term use  - Coordinating with GI  - Home ASA 81mg  and Pravastatin  20mg  daily - Hold eliquis  in setting of GI bleed - Holding Spiro with Hyperkalemia  - Hold Metoprolol   -Holding Lasix , patient is euvolemic at this time   # Melena # PUD # Anemia Pt had a history of recent  black tarry stools and recent Hgb drop from 11.3-->9.9. Bedside FOBT was positive. Suspect this is due to her Eliquis ,  NSAIDs and ASA use as well as her history of drinking hard liquor regularly.  - GI consulted, appreciate recs  - EGD tomorrow for concern of upper GI bleed if stable from a cardiac standpoint, may pursue colonoscopy later. - Protonix 40mg  IV Q12H - Holding Eliquis   # COPD # Chronic Respiratory Failure Pt has a history of COPD, is on chronic O2 at home, but not taking any inhalers. No concern for acute exacerbation at this time.  - Pulmicort nebulizers 0.25mg  BID - Ipratropium 0.5mg  nebulizer Q4H PRN  # CKD Stage 3 #Hyperkalemia Hx of CKD - Monitor Creatinine, caution with nephrotoxic medications -Hold spironolactone  -s/p lokelma  #Alcohol Use -CIWA without ativan   Diet: CLD now, NPO after midnight VTE: SCDs Code: Full  Prior to Admission Living Arrangement: Home, living with 2 other adults Anticipated Discharge Location: TBD Barriers to Discharge: Clinical stability  Dispo: Admit patient to Inpatient with expected length of stay greater than 2 midnights.  Signed:  CHARM Penne Mori; DO - PGY1 Internal Medicine Residency Program 07/27/2024, 5:23 PM   Please contact the on call pager at [phone]

## 2024-07-27 NOTE — Plan of Care (Signed)

## 2024-07-27 NOTE — ED Notes (Signed)
 CCMD called by this RN

## 2024-07-27 NOTE — Consult Note (Signed)
 Consultation Note   Referring Provider:  Teaching Service PCP: Baird Comer GAILS, NP Primary Gastroenterologist:    [facility]     Reason for Consultation:  Dark stool, FOBT+, anemia DOA: 07/27/2024         Hospital Day: 1   ASSESSMENT    73 year old female with cardiac disease / known Afib with RVR ( on Eliquis )  admitted with chest pain in Afib with RVR.  Heart rate improving with diltiazem.Last dose  of Elquis was this am  Melena -  one episode of melena 2 days ago in setting of NSAID and Eliquis .  Rule out  erosive disease, PUD, bleeding from AVMs, neoplasm.   Macrocytic anemia, chronic.  Macrocytosis possibly due in part to EtOH.  Baseline hemoglobin 11.3.  Her hemoglobin is stable at 10.9.  Wa  CAD status post CABG in 2021  Chronic systolic heart failure with recovered EF Echo pending. Cardiology is evaluating her  COPD on home O2 Followed by [facility]  CKD stage IIIa  See PMH for any additional medical history  / medical problems   PLAN:   -Awaiting iron studies, B12 and folate -Monitor H/H -BID Pantoprazole -Awaiting CMET to see what liver chemistries look like in setting of Etoh -Clear liquids, n.p.o. after midnight --Though presentation seems most consistent with upper GI bleed, patient has never had a colonoscopy.  Blood in stool and anemia does warrant a colonoscopy but she is hesitant at this point .  She is agreeable to start with an EGD .  Will plan for EGD tomorrow (if stable from cardiac standpoint) .The risks and benefits of EGD with possible biopsies were discussed with the patient who agrees to proceed.   HPI   Patient presented to the ED today with 2 weeks worth of shortness of breath, she has a history of atrial fibrillation with RVR and cardio conversion, she takes Eliquis .,  She also has COPD and is on 2 L of O2 at home. Heart rate by EMS noted to be as high as 180. In the ED patient  described having had a black bowel movement a couple days ago  Patient has chronic anemia, baseline hemoglobin seems to be in the 9-10 range.  She is significantly macrocytic.  Presenting hemoglobin today 9.9, up to 10.9 we will recheck.  MCV 116.  Stool is Hemoccult positive.  She has not had a bowel movement since the black one to 2 days ago.  She has not had any abdominal pain.  No nausea, vomiting or other GI symptoms.  Years ago she used to have a lot of GERD symptoms but none in years so is not on any acid reducers.  Patient takes a daily baby aspirin  at home about 3 times a week.  Family in the room discloses that she also takes Excedrin for migraines.  Patient does endorse taking Excedrin about 3 times a week.  She has never had a colonoscopy and not really interested in having one.  No family history colon cancer . No weight loss at home, she endorses weight gain.   Notable labs /imaging Hemoglobin 10.9, MCV 116, BUN 35, creatinine 1.3, ionized calcium  low 1.09, BNP elevated at 1340, high-sensitivity troponin normal.  FOBT positive, '  Chest x-ray  -minimal bibasilar atelectasis or scarring  Echo results are pending   Pertinent GI Studies    none  Recent Labs    07/27/24 0930 07/27/24 1235  WBC 6.1  --   HGB 9.9* 10.9*  HCT 31.6* 32.0*  MCV 116.2*  --   PLT 325  --    Recent Labs    07/27/24 0930 07/27/24 1235  NA 136 136  K 5.2* 4.9  CL 106 110  CO2 14*  --   GLUCOSE 74 74  BUN 28* 35*  CREATININE 1.17* 1.30*  CALCIUM  8.5*  --      ECHOCARDIOGRAM COMPLETE    ECHOCARDIOGRAM REPORT       Patient Name:   [name] Date of Exam: 07/27/2024 Medical Rec #:  [id number]           Height:       63.0 in Accession #:    [id number]          Weight:       169.0 lb Date of Birth:  1950-11-12           BSA:          1.800 m Patient Age:    73 years            BP:           100/63 mmHg Patient Gender: F                   HR:           120 bpm. Exam Location:   Inpatient  Procedure: 2D Echo, Cardiac Doppler, Color Doppler and Intracardiac            Opacification Agent (Both Spectral and Color Flow Doppler were            utilized during procedure).  Indications:    A-fib   History:        Patient has prior history of Echocardiogram examinations, most                 recent 02/17/2024. CAD, Prior CABG, COPD, Arrythmias:Atrial                 Fibrillation, Signs/Symptoms:Chest Pain; Risk Factors:Former                 Smoker and Hypertension.   Sonographer:    Juliene Rucks Referring Phys: [id number] MEGAN L KAMMERER  IMPRESSIONS   1. Left ventricular ejection fraction, by estimation, is 20 to 25%. The left ventricle has severely decreased function. The left ventricle demonstrates global hypokinesis. Left ventricular diastolic function could not be evaluated. Elevated left atrial  pressure.  2. Right ventricular systolic function is severely reduced. The right ventricular size is normal. Tricuspid regurgitation signal is inadequate for assessing PA pressure.  3. The mitral valve is degenerative. No evidence of mitral valve regurgitation. No evidence of mitral stenosis.  4. The aortic valve is normal in structure. Aortic valve regurgitation is not visualized. No aortic stenosis is present.  Comparison(s): Changes from prior study are noted. LV function has worsened.  FINDINGS  Left Ventricle: Left ventricular ejection fraction, by estimation, is 20 to 25%. The left ventricle has severely decreased function. The left ventricle demonstrates global hypokinesis. Definity contrast agent was given IV to delineate the left  ventricular endocardial borders. The left ventricular internal cavity size was normal in size. There is no left ventricular hypertrophy. Left ventricular diastolic function  could not be evaluated due to atrial fibrillation. Left ventricular diastolic  function could not be evaluated. Elevated left atrial pressure.  Right Ventricle: The  right ventricular size is normal. No increase in right ventricular wall thickness. Right ventricular systolic function is severely reduced. Tricuspid regurgitation signal is inadequate for assessing PA pressure.  Left Atrium: Left atrial size was normal in size.  Right Atrium: Right atrial size was normal in size.  Pericardium: There is no evidence of pericardial effusion.  Mitral Valve: The mitral valve is degenerative in appearance. Mild mitral annular calcification. No evidence of mitral valve regurgitation. No evidence of mitral valve stenosis. MV peak gradient, 7.3 mmHg. The mean mitral valve gradient is 3.0 mmHg.  Tricuspid Valve: The tricuspid valve is normal in structure. Tricuspid valve regurgitation is mild . No evidence of tricuspid stenosis.  Aortic Valve: The aortic valve is normal in structure. Aortic valve regurgitation is not visualized. No aortic stenosis is present.  Pulmonic Valve: The pulmonic valve was normal in structure. Pulmonic valve regurgitation is trivial. No evidence of pulmonic stenosis.  Aorta: The aortic root is normal in size and structure.  Venous: The inferior vena cava was not well visualized.  IAS/Shunts: No atrial level shunt detected by color flow Doppler.    LEFT VENTRICLE PLAX 2D LVIDd:         5.00 cm   Diastology LVIDs:         3.80 cm   LV e' medial:  5.82 cm/s LV PW:         1.25 cm   LV e' lateral: 7.70 cm/s LV IVS:        1.10 cm LVOT diam:     2.00 cm LV SV:         44 LV SV Index:   25 LVOT Area:     3.14 cm    LEFT ATRIUM           Index        RIGHT ATRIUM           Index LA diam:      3.80 cm 2.11 cm/m   RA Area:     18.70 cm LA Vol (A4C): 43.6 ml 24.22 ml/m  RA Volume:   53.80 ml  29.89 ml/m  AORTIC VALVE LVOT Vmax:   80.80 cm/s LVOT Vmean:  50.200 cm/s LVOT VTI:    0.141 m   AORTA Ao Root diam: 3.10 cm Ao Asc diam:  3.50 cm  MITRAL VALVE              TRICUSPID VALVE MV Area VTI:  1.59 cm    TR Peak grad:    21.9 mmHg MV Peak grad: 7.3 mmHg    TR Vmax:        234.00 cm/s MV Mean grad: 3.0 mmHg MV Vmax:      1.35 m/s    SHUNTS MV Vmean:     70.4 cm/s   Systemic VTI:  0.14 m MR Peak grad: 84.3 mmHg   Systemic Diam: 2.00 cm MR Vmax:      459.00 cm/s  Morene Brownie Electronically signed by Morene Brownie Signature Date/Time: 07/27/2024/4:22:50 PM      Final   DG Chest 1 View EXAM: 1 VIEW(S) XRAY OF THE CHEST 07/27/2024 09:40:51 AM  COMPARISON: 01/06/2024  CLINICAL HISTORY: sob  FINDINGS:  LUNGS AND PLEURA: Minimal bibasilar subsegmental atelectasis or scarring is noted. No focal pulmonary opacity. No pulmonary edema. No pleural effusion.  No pneumothorax.  HEART AND MEDIASTINUM: Stable cardiomediastinal silhouette. Sternotomy wires are again noted.  BONES AND SOFT TISSUES: Sternotomy wires are again noted. No acute osseous abnormality.  IMPRESSION: 1. Minimal bibasilar subsegmental atelectasis or scarring.  Electronically signed by: Lynwood Seip MD 07/27/2024 09:56 AM EST RP Workstation: [id number]     Past Medical History:  Diagnosis Date   A-fib (HCC)    Carotid artery disease    COPD (chronic obstructive pulmonary disease) (HCC)    Coronary artery disease    Former tobacco use    Heart failure with mildly reduced ejection fraction (HFmrEF) (HCC)    Hx of CABG    Hypertension    PUD (peptic ulcer disease)     Past Surgical History:  Procedure Laterality Date   ABDOMINAL HYSTERECTOMY     CARDIOVERSION N/A 03/20/2023   Procedure: CARDIOVERSION;  Surgeon: Rolan Ezra RAMAN, MD;  Location: [facility] INVASIVE CV LAB;  Service: Cardiovascular;  Laterality: N/A;   CHOLECYSTECTOMY     CORONARY ARTERY BYPASS GRAFT     RIGHT/LEFT HEART CATH AND CORONARY/GRAFT ANGIOGRAPHY N/A 03/17/2023   Procedure: RIGHT/LEFT HEART CATH AND CORONARY/GRAFT ANGIOGRAPHY;  Surgeon: Anner Alm ORN, MD;  Location: [facility] INVASIVE CV LAB;  Service: Cardiovascular;  Laterality: N/A;   ruptured disc      SPINE SURGERY     TEE WITHOUT CARDIOVERSION N/A 03/20/2023   Procedure: TRANSESOPHAGEAL ECHOCARDIOGRAM;  Surgeon: Rolan Ezra RAMAN, MD;  Location: [facility] INVASIVE CV LAB;  Service: Cardiovascular;  Laterality: N/A;    Family History  Problem Relation Age of Onset   Lupus Mother    Lung disease Neg Hx     Prior to Admission medications   Medication Sig Start Date End Date Taking? Authorizing Provider  acetaminophen  (TYLENOL ) 500 MG tablet Take 1,000 mg by mouth 2 (two) times daily as needed (pain).   Yes [provider]  albuterol  (VENTOLIN  HFA) 108 (90 Base) MCG/ACT inhaler Inhale 1 puff into the lungs 2 (two) times daily as needed for wheezing or shortness of breath. 07/10/21  Yes [provider]  apixaban  (ELIQUIS ) 5 MG TABS tablet Take 1 tablet (5 mg total) by mouth 2 (two) times daily. 08/12/23  Yes Pietro Redell RAMAN, MD  cyclobenzaprine (FLEXERIL) 10 MG tablet Take 10 mg by mouth at bedtime. 12/31/23  Yes [provider]  diazepam (VALIUM) 5 MG tablet Take 5 mg by mouth 2 (two) times daily as needed for anxiety. 11/06/23  Yes [provider]  furosemide  (LASIX ) 20 MG tablet Take 20 mg by mouth See admin instructions. Take 20mg  (1 tablet) by mouth every 5 days. 03/11/24  Yes [provider]  lidocaine  (LIDODERM ) 5 % Place 1 patch onto the skin daily as needed (Pain). 07/08/24  Yes [provider]  metoprolol  succinate (TOPROL -XL) 25 MG 24 hr tablet Take 1 tablet (25 mg total) by mouth at bedtime. Take with or immediately following a meal. 02/25/24  Yes Meng, Hao, PA  potassium chloride  SA (KLOR-CON  M) 20 MEQ tablet Take 1 tablet (20 mEq total) by mouth as needed. Only take potassium on the day you take lasix  Patient taking differently: Take 20 mEq by mouth See admin instructions. Take 20mEq (1 tablet) by mouth every 5 days. 02/25/24  Yes Meng, Hao, PA  pravastatin  (PRAVACHOL ) 20 MG tablet Take 1 tablet (20 mg total) by mouth daily at 6 PM.  11/30/23 02/03/25 Yes Meng, Hao, PA  sacubitril -valsartan  (ENTRESTO ) 24-26 MG Take 1 tablet by mouth 2 (two) times  daily.   Yes [provider]  alendronate (FOSAMAX) 70 MG tablet Take 70 mg by mouth once a week. Patient not taking: Reported on 07/27/2024 07/06/24   [provider]  OXYGEN Inhale into the lungs as needed.    [provider]    Current Facility-Administered Medications  Medication Dose Route Frequency Provider Last Rate Last Admin   acetaminophen  (TYLENOL ) tablet 1,000 mg  1,000 mg Oral BID PRN Kandis, Emily, DO       budesonide (PULMICORT) nebulizer solution 0.25 mg  0.25 mg Nebulization BID Bender, Emily, DO       diltiazem (CARDIZEM) 125 mg in dextrose  5% 125 mL (1 mg/mL) infusion  5-15 mg/hr Intravenous Continuous Kandis Perkins, DO 15 mL/hr at 07/27/24 1437 15 mg/hr at 07/27/24 1437   ipratropium (ATROVENT) nebulizer solution 0.5 mg  0.5 mg Nebulization Q4H PRN Kandis Perkins, DO       lidocaine  (LIDODERM ) 5 % 1 patch  1 patch Transdermal Daily PRN Kandis Perkins, DO       pantoprazole (PROTONIX) injection 40 mg  40 mg Intravenous Q12H Bender, Perkins, DO   40 mg at 07/27/24 1449   perflutren lipid microspheres (DEFINITY) IV suspension  1-10 mL Intravenous PRN Gennaro Bouchard L, DO   3 mL at 07/27/24 1525   pravastatin  (PRAVACHOL ) tablet 20 mg  20 mg Oral q1800 Kandis Perkins, DO       sacubitril -valsartan  (ENTRESTO ) 24-26 mg per tablet  1 tablet Oral BID Kandis Perkins, DO       Current Outpatient Medications  Medication Sig Dispense Refill   acetaminophen  (TYLENOL ) 500 MG tablet Take 1,000 mg by mouth 2 (two) times daily as needed (pain).     albuterol  (VENTOLIN  HFA) 108 (90 Base) MCG/ACT inhaler Inhale 1 puff into the lungs 2 (two) times daily as needed for wheezing or shortness of breath.     apixaban  (ELIQUIS ) 5 MG TABS tablet Take 1 tablet (5 mg total) by mouth 2 (two) times daily. 180 tablet 1   cyclobenzaprine (FLEXERIL) 10 MG tablet Take 10 mg  by mouth at bedtime.     diazepam (VALIUM) 5 MG tablet Take 5 mg by mouth 2 (two) times daily as needed for anxiety.     furosemide  (LASIX ) 20 MG tablet Take 20 mg by mouth See admin instructions. Take 20mg  (1 tablet) by mouth every 5 days.     lidocaine  (LIDODERM ) 5 % Place 1 patch onto the skin daily as needed (Pain).     metoprolol  succinate (TOPROL -XL) 25 MG 24 hr tablet Take 1 tablet (25 mg total) by mouth at bedtime. Take with or immediately following a meal. 90 tablet 3   potassium chloride  SA (KLOR-CON  M) 20 MEQ tablet Take 1 tablet (20 mEq total) by mouth as needed. Only take potassium on the day you take lasix  (Patient taking differently: Take 20 mEq by mouth See admin instructions. Take 20mEq (1 tablet) by mouth every 5 days.) 30 tablet 1   pravastatin  (PRAVACHOL ) 20 MG tablet Take 1 tablet (20 mg total) by mouth daily at 6 PM. 90 tablet 3   sacubitril -valsartan  (ENTRESTO ) 24-26 MG Take 1 tablet by mouth 2 (two) times daily.     alendronate (FOSAMAX) 70 MG tablet Take 70 mg by mouth once a week. (Patient not taking: Reported on 07/27/2024)     OXYGEN Inhale into the lungs as needed.      Allergies as of 07/27/2024   (No Known Allergies)    Social  History   Socioeconomic History   Marital status: Divorced    Spouse name: Not on file   Number of children: 2   Years of education: Not on file   Highest education level: 11th grade  Occupational History   Occupation: Retired  Tobacco Use   Smoking status: Former    Current packs/day: 0.00    Average packs/day: 1.5 packs/day for 54.9 years (82.3 ttl pk-yrs)    Types: Cigarettes    Start date: 09/16/1967    Quit date: 08/05/2022    Years since quitting: 1.9   Smokeless tobacco: Never  Vaping Use   Vaping status: Never Used  Substance and Sexual Activity   Alcohol use: Not Currently   Drug use: Never   Sexual activity: Not on file  Other Topics Concern   Not on file  Social History Narrative   Not on file   Social  Drivers of Health   Financial Resource Strain: Low Risk  (12/10/2023)   Received from [facility]   Overall Financial Resource Strain (CARDIA)    Difficulty of Paying Living Expenses: Not hard at all  Food Insecurity: No Food Insecurity (12/10/2023)   Received from [facility]   Hunger Vital Sign    Within the past 12 months, you worried that your food would run out before you got the money to buy more.: Never true    Within the past 12 months, the food you bought just didn't last and you didn't have money to get more.: Never true  Transportation Needs: No Transportation Needs (12/10/2023)   Received from [facility] - Transportation    Lack of Transportation (Medical): No    Lack of Transportation (Non-Medical): No  Physical Activity: Unknown (12/10/2023)   Received from [facility]   Exercise Vital Sign    On average, how many days per week do you engage in moderate to strenuous exercise (like a brisk walk)?: 0 days    Minutes of Exercise per Session: Not on file  Stress: No Stress Concern Present (12/10/2023)   Received from [facility] - Occupational Stress Questionnaire    Feeling of Stress : Only a little  Recent Concern: Stress - Stress Concern Present (11/06/2023)   Received from [facility] - Occupational Stress Questionnaire    Feeling of Stress : Very much  Social Connections: Socially Integrated (12/10/2023)   Received from [facility]   Social Network    How would you rate your social network (family, work, friends)?: Good participation with social networks  Intimate Partner Violence: Not At Risk (12/10/2023)   Received from [facility]   HITS    Over the last 12 months how often did your partner physically hurt you?: Never    Over the last 12 months how often did your partner insult you or talk down to you?: Never    Over the last 12 months how often did your  partner threaten you with physical harm?: Never    Over the last 12 months how often did your partner scream or curse at you?: Never     Code Status   Code Status: Full Code  Review of Systems: All systems reviewed and negative except where noted in HPI.  Physical Exam: Vital signs in last 24 hours: Temp:  [97.6 F (36.4 C)-97.8 F (36.6 C)] 97.8 F (36.6 C) (11/12 1333) Pulse Rate:  [55-160] 67 (11/12  1605) Resp:  [16-31] 24 (11/12 1605) BP: (101-150)/(64-110) 101/70 (11/12 1600) SpO2:  [94 %-100 %] 99 % (11/12 1605) Weight:  [76.7 kg] 76.7 kg (11/12 0933)    General:  Pleasant female in NAD Psych:  Cooperative. Normal mood and affect Eyes: Pupils equal Ears:  Normal auditory acuity Nose: No deformity, discharge or lesions Neck:  Supple, no masses felt Lungs:  Clear to auscultation anteriorly Heart:   Regular rate just for diagnostic Abdomen:  Soft, nondistended, nontender, active bowel sounds, no masses felt Rectal :  Deferred Msk: Symmetrical without gross deformities.  Neurologic:  Alert, oriented, grossly normal neurologically Extremities : Trace bilateral pedal edema  Skin:  Intact without significant lesions.    Intake/Output from previous day: No intake/output data recorded. Intake/Output this shift:  No intake/output data recorded.   Vina Dasen, NP-C   07/27/2024, 4:24 PM

## 2024-07-27 NOTE — Consult Note (Addendum)
 Cardiology Consultation  Patient ID: Monica Zamora MRN: [id number]; DOB: 19-Jul-1951  Admit date: 07/27/2024 Date of Consult: 07/27/2024  PCP:  [name], NP   [facility] Providers Cardiologist:  Redell Shallow, MD     Patient Profile: Monica Zamora is a 73 y.o. female with a hx of CAD s/p CABG 2021, carotid artery disease, chronic HFrEF with recovered EF, paroxysmal atrial fibrillation s/p LAA clipping, renal artery stenosis, mesenteric artery stenosis, CKD 3a, severe COPD on home oxygen and hypertension who is being seen 07/27/2024 for the evaluation of A-Fib RVR at the request of Dr. Eben.  History of Present Illness: Monica Zamora has past medical history as stated above. She presented to [facility] ED on 07/27/2024 for tachycardia. She has a past medical history of atrial fibrillation and reports that she has been compliant with her metoprolol  and Eliquis . It was reported that highest HR was 180s.  Relevant workup since in the ED: EKG showed A-Fib RVR with HR 145, labs showed creatinine 1.17 ? 1.30, troponin negative x 2, BNP was 1,340, hemoglobin 9.9 (baseline 11-12), positive fecal occult. CXR showed minimal bibasilar subsegmental atelectasis or scarring.   She is a patient of Dr. Shallow and was last seen by Scot Ford, PA-C 04/2024. She has a past medical history of A-Fib and has undergone multiple cardioversions in the past. She was discharged on amiodarone . Patient was last seen by Dr. Shallow in November 2024. Since she has severe home O2 dependent COPD, therefore the decision was made to discontinue amiodarone . She was continued on Eliquis . It was felt that if she develop any recurrent atrial fibrillation in the future, Tikosyn may be a good option.   In regards to her heart failure. Echocardiogram from 02/2024 showed LVEF 50-55%, normal RV function, trivial MR, normal IVC. Blood work earlier this year showed potassium of 6.3, her spironolactone   was discontinued. At some point her Lasix  was changed to PRN, her son was giving it to her once every 3 days as of August 2025.   When she was noted to be euvolemic during a visit he weighed 169 lb.   Home medications include: Eliquis  5 mg BID, ASA 81 mg daily, PO Lasix  40 mg PRN (she has been taking 20 mg every 5 days), Toprol  25 mg daily, pravastatin  20 mg daily, Entresto  24-26 mg BID. She is not on Farxiga  or Jardiance due to recurrent UTI. Spironolactone  was stopped as noted above.   After speaking with the patient and her family present in the room, they state that they were originally heading to the urgent care due to right shoulder pain. Then the patient noted that she was   Past Medical History:  Diagnosis Date   A-fib [facility])    Carotid artery disease    COPD (chronic obstructive pulmonary disease) (HCC)    Coronary artery disease    Former tobacco use    Heart failure with mildly reduced ejection fraction (HFmrEF) (HCC)    Hx of CABG    Hypertension    PUD (peptic ulcer disease)    Past Surgical History:  Procedure Laterality Date   ABDOMINAL HYSTERECTOMY     CARDIOVERSION N/A 03/20/2023   Procedure: CARDIOVERSION;  Surgeon: Rolan Ezra RAMAN, MD;  Location: [facility] INVASIVE CV LAB;  Service: Cardiovascular;  Laterality: N/A;   CHOLECYSTECTOMY     CORONARY ARTERY BYPASS GRAFT     RIGHT/LEFT HEART CATH AND CORONARY/GRAFT ANGIOGRAPHY N/A 03/17/2023   Procedure: RIGHT/LEFT HEART CATH AND  CORONARY/GRAFT ANGIOGRAPHY;  Surgeon: Anner Alm ORN, MD;  Location: [facility] INVASIVE CV LAB;  Service: Cardiovascular;  Laterality: N/A;   ruptured disc     SPINE SURGERY     TEE WITHOUT CARDIOVERSION N/A 03/20/2023   Procedure: TRANSESOPHAGEAL ECHOCARDIOGRAM;  Surgeon: Rolan Ezra RAMAN, MD;  Location: [facility] INVASIVE CV LAB;  Service: Cardiovascular;  Laterality: N/A;    Home Medications:  Prior to Admission medications   Medication Sig Start Date End Date Taking? Authorizing Provider  acetaminophen   (TYLENOL ) 500 MG tablet Take 1,000 mg by mouth 2 (two) times daily as needed (pain).   Yes [provider]  albuterol  (VENTOLIN  HFA) 108 (90 Base) MCG/ACT inhaler Inhale 1 puff into the lungs 2 (two) times daily as needed for wheezing or shortness of breath. 07/10/21  Yes [provider]  apixaban  (ELIQUIS ) 5 MG TABS tablet Take 1 tablet (5 mg total) by mouth 2 (two) times daily. 08/12/23  Yes Pietro Redell RAMAN, MD  cyclobenzaprine (FLEXERIL) 10 MG tablet Take 10 mg by mouth at bedtime. 12/31/23  Yes [provider]  diazepam (VALIUM) 5 MG tablet Take 5 mg by mouth 2 (two) times daily as needed for anxiety. 11/06/23  Yes [provider]  furosemide  (LASIX ) 20 MG tablet Take 20 mg by mouth See admin instructions. Take 20mg  (1 tablet) by mouth every 5 days. 03/11/24  Yes [provider]  lidocaine  (LIDODERM ) 5 % Place 1 patch onto the skin daily as needed (Pain). 07/08/24  Yes [provider]  metoprolol  succinate (TOPROL -XL) 25 MG 24 hr tablet Take 1 tablet (25 mg total) by mouth at bedtime. Take with or immediately following a meal. 02/25/24  Yes Meng, Hao, PA  potassium chloride  SA (KLOR-CON  M) 20 MEQ tablet Take 1 tablet (20 mEq total) by mouth as needed. Only take potassium on the day you take lasix  Patient taking differently: Take 20 mEq by mouth See admin instructions. Take 20mEq (1 tablet) by mouth every 5 days. 02/25/24  Yes Meng, Hao, PA  pravastatin  (PRAVACHOL ) 20 MG tablet Take 1 tablet (20 mg total) by mouth daily at 6 PM. 11/30/23 02/03/25 Yes Meng, Hao, PA  sacubitril -valsartan  (ENTRESTO ) 24-26 MG Take 1 tablet by mouth 2 (two) times daily.   Yes [provider]  alendronate (FOSAMAX) 70 MG tablet Take 70 mg by mouth once a week. Patient not taking: Reported on 07/27/2024 07/06/24   [provider]  OXYGEN Inhale into the lungs as needed.    [provider]   Scheduled Meds:  amiodarone   150 mg Intravenous Once    budesonide (PULMICORT) nebulizer solution  0.25 mg Nebulization BID   pantoprazole (PROTONIX) IV  40 mg Intravenous Q12H   pravastatin   20 mg Oral q1800   sacubitril -valsartan   1 tablet Oral BID   Continuous Infusions:  amiodarone      Followed by   amiodarone      PRN Meds: acetaminophen , ipratropium, lidocaine , perflutren lipid microspheres (DEFINITY) IV suspension  Allergies:   No Known Allergies  Social History:   Social History   Socioeconomic History   Marital status: Divorced    Spouse name: Not on file   Number of children: 2   Years of education: Not on file   Highest education level: 11th grade  Occupational History   Occupation: Retired  Tobacco Use   Smoking status: Former    Current packs/day: 0.00    Average packs/day: 1.5 packs/day for 54.9 years (82.3 ttl pk-yrs)    Types:  Cigarettes    Start date: 09/16/1967    Quit date: 08/05/2022    Years since quitting: 1.9   Smokeless tobacco: Never  Vaping Use   Vaping status: Never Used  Substance and Sexual Activity   Alcohol use: Not Currently   Drug use: Never   Sexual activity: Not on file  Other Topics Concern   Not on file  Social History Narrative   Not on file   Social Drivers of Health   Financial Resource Strain: Low Risk  (12/10/2023)   Received from [facility]   Overall Financial Resource Strain (CARDIA)    Difficulty of Paying Living Expenses: Not hard at all  Food Insecurity: No Food Insecurity (12/10/2023)   Received from [facility]   Hunger Vital Sign    Within the past 12 months, you worried that your food would run out before you got the money to buy more.: Never true    Within the past 12 months, the food you bought just didn't last and you didn't have money to get more.: Never true  Transportation Needs: No Transportation Needs (12/10/2023)   Received from [facility] - Transportation    Lack of Transportation (Medical): No    Lack of Transportation (Non-Medical): No   Physical Activity: Unknown (12/10/2023)   Received from [facility]   Exercise Vital Sign    On average, how many days per week do you engage in moderate to strenuous exercise (like a brisk walk)?: 0 days    Minutes of Exercise per Session: Not on file  Stress: No Stress Concern Present (12/10/2023)   Received from [facility] - Occupational Stress Questionnaire    Feeling of Stress : Only a little  Recent Concern: Stress - Stress Concern Present (11/06/2023)   Received from [facility] - Occupational Stress Questionnaire    Feeling of Stress : Very much  Social Connections: Socially Integrated (12/10/2023)   Received from [facility]   Social Network    How would you rate your social network (family, work, friends)?: Good participation with social networks  Intimate Partner Violence: Not At Risk (12/10/2023)   Received from [facility]   HITS    Over the last 12 months how often did your partner physically hurt you?: Never    Over the last 12 months how often did your partner insult you or talk down to you?: Never    Over the last 12 months how often did your partner threaten you with physical harm?: Never    Over the last 12 months how often did your partner scream or curse at you?: Never    Family History:   Family History  Problem Relation Age of Onset   Lupus Mother    Lung disease Neg Hx      ROS:  Please see the history of present illness.  All other ROS reviewed and negative.     Physical Exam/Data: Vitals:   07/27/24 1333 07/27/24 1555 07/27/24 1600 07/27/24 1605  BP:   101/70   Pulse:  (!) 55 (!) 59 67  Resp:  (!) 23 (!) 25 (!) 24  Temp: 97.8 F (36.6 C)     TempSrc: Oral     SpO2:  98% 98% 99%  Weight:      Height:       No intake or output data in the 24 hours ending 07/27/24 1658  07/27/2024    9:33 AM 04/26/2024    3:02 PM 02/25/2024    3:16 PM  Last 3  Weights  Weight (lbs) 169 lb 169 lb 178 lb 12.8 oz  Weight (kg) 76.658 kg 76.658 kg 81.103 kg     Body mass index is 29.94 kg/m.   General:  in no acute distress, on chronic oxygen HEENT: normal Neck: no JVD Vascular: No carotid bruits; Distal pulses 2+ bilaterally Cardiac:  normal S1, S2; iRRR Lungs:  decreased air movement   Abd: soft, nontender, no hepatomegaly  Ext: no edema Musculoskeletal:  No deformities, BUE and BLE strength normal and equal Skin: warm and dry  Neuro:   no focal abnormalities noted Psych:  Normal affect   EKG:  The EKG was personally reviewed and demonstrates:  A-Fib with RVR  Telemetry:  Telemetry was personally reviewed and demonstrates:  rate controlled A-Fib with HR 80-90s  Relevant CV Studies:  Echocardiogram, 07/27/2024 Ordered, pending results   Echocardiogram, 02/17/2024 Left ventricular ejection fraction, by estimation, is 50 to 55% . Left ventricular ejection fraction by 3D volume is 52 % . The left ventricle has low normal function. The left ventricle has no regional wall motion abnormalities. Left ventricular diastolic parameters were normal.  Right ventricular systolic function is normal. The right ventricular size is normal. There is normal pulmonary artery systolic pressure.  The mitral valve is normal in structure. Trivial mitral valve regurgitation. No evidence of mitral stenosis.  The aortic valve is tricuspid. Aortic valve regurgitation is not visualized. No aortic stenosis is present.  The inferior vena cava is normal in size with greater than 50% respiratory variability, suggesting right atrial pressure of 3 mmHg.  R/LHC, 03/17/2023   Prox RCA lesion is 100% stenosed.  Dist Cx lesion is 100% stenosed. -, RPDA lesion is 55% stenosed.   Prox LAD lesion is 60% stenosed.  Mid LAD lesion is 70% stenosed.   LIMA graft was visualized by angiography.   SVG-OM3 graft was visualized by angiography and is normal in caliber.  The graft exhibits no  disease.   SVG-rPDA  graft was visualized by angiography and is large.  The graft exhibits no disease.   Hemodynamic findings consistent with moderate pulmonary hypertension with Severe RV Failure.   There is no aortic valve stenosis.   POST-CATH FINDINGS Severe combined CHF with severely elevated right-sided pressures: RAP mean 24 mmHg with large mild wave.; RV P-EDP 57/8-17 mmHg. PAP-mean 6/37-42 mmHg; PCWP 35 mmHg with V wave up to 43. Significant respiratory variation. LV P-EDP 118/16-22 mmHg, AO P-MAP 106/73-79 mmHg Ao sat 96, PA sat 54%. CO-CI (Fick) 4.51-2.35; PAPI 0.86 Significant native vessel CAD with 3 of 3 patent grafts: 100% proximal RCA CTO; 100% mLCx; Prox LAD 60% & mLAD ~70% (competetive flow from LIMA-LAD) Patent LIMA-LAD, SVG-PDA, SVG-OM 3    RECOMMENDATIONS: Return patient to nursing unit for ongoing care with IV diuresis => Heart Failure Service Consulted based on Severe RV Failure She was given 80 mg IV Lasix  in the Cath Lab With the potential for having other invasive procedures including PICC line, I have written to restart IV heparin  2 hours after sheath removal.  Would convert back to DOAC once invasive procedures are completed.  Laboratory Data: High Sensitivity Troponin:   Recent Labs  Lab 07/27/24 0930 07/27/24 1241  TROPONINIHS 12 10     Chemistry Recent Labs  Lab 07/27/24 0930 07/27/24 1235  NA 136 136  K 5.2* 4.9  CL 106  110  CO2 14*  --   GLUCOSE 74 74  BUN 28* 35*  CREATININE 1.17* 1.30*  CALCIUM  8.5*  --   MG 2.0  --   GFRNONAA 50*  --   ANIONGAP 16*  --     No results for input(s): PROT, ALBUMIN, AST, ALT, ALKPHOS, BILITOT in the last 168 hours. Lipids No results for input(s): CHOL, TRIG, HDL, LABVLDL, LDLCALC, CHOLHDL in the last 168 hours.  Hematology Recent Labs  Lab 07/27/24 0930 07/27/24 1235  WBC 6.1  --   RBC 2.72*  --   HGB 9.9* 10.9*  HCT 31.6* 32.0*  MCV 116.2*  --   MCH 36.4*  --   MCHC 31.3   --   RDW 15.0  --   PLT 325  --    Thyroid  No results for input(s): TSH, FREET4 in the last 168 hours.  BNP Recent Labs  Lab 07/27/24 0930  BNP 1,340.4*    DDimer No results for input(s): DDIMER in the last 168 hours.  Radiology/Studies:  ECHOCARDIOGRAM COMPLETE Result Date: 07/27/2024    ECHOCARDIOGRAM REPORT   Patient Name:   Monica Zamora Date of Exam: 07/27/2024 Medical Rec #:  [id number]           Height:       63.0 in Accession #:    [id number]          Weight:       169.0 lb Date of Birth:  10-31-1950           BSA:          1.800 m Patient Age:    73 years            BP:           100/63 mmHg Patient Gender: F                   HR:           120 bpm. Exam Location:  Inpatient Procedure: 2D Echo, Cardiac Doppler, Color Doppler and Intracardiac            Opacification Agent (Both Spectral and Color Flow Doppler were            utilized during procedure). Indications:    A-fib  History:        Patient has prior history of Echocardiogram examinations, most                 recent 02/17/2024. CAD, Prior CABG, COPD, Arrythmias:Atrial                 Fibrillation, Signs/Symptoms:Chest Pain; Risk Factors:Former                 Smoker and Hypertension.  Sonographer:    Juliene Rucks Referring Phys: [id number] MEGAN L KAMMERER IMPRESSIONS  1. Left ventricular ejection fraction, by estimation, is 20 to 25%. The left ventricle has severely decreased function. The left ventricle demonstrates global hypokinesis. Left ventricular diastolic function could not be evaluated. Elevated left atrial pressure.  2. Right ventricular systolic function is severely reduced. The right ventricular size is normal. Tricuspid regurgitation signal is inadequate for assessing PA pressure.  3. The mitral valve is degenerative. No evidence of mitral valve regurgitation. No evidence of mitral stenosis.  4. The aortic valve is normal in structure. Aortic valve regurgitation is not visualized. No aortic stenosis is present.  Comparison(s): Changes from prior study are noted. LV function has worsened.  FINDINGS  Left Ventricle: Left ventricular ejection fraction, by estimation, is 20 to 25%. The left ventricle has severely decreased function. The left ventricle demonstrates global hypokinesis. Definity contrast agent was given IV to delineate the left ventricular endocardial borders. The left ventricular internal cavity size was normal in size. There is no left ventricular hypertrophy. Left ventricular diastolic function could not be evaluated due to atrial fibrillation. Left ventricular diastolic function could not be evaluated. Elevated left atrial pressure. Right Ventricle: The right ventricular size is normal. No increase in right ventricular wall thickness. Right ventricular systolic function is severely reduced. Tricuspid regurgitation signal is inadequate for assessing PA pressure. Left Atrium: Left atrial size was normal in size. Right Atrium: Right atrial size was normal in size. Pericardium: There is no evidence of pericardial effusion. Mitral Valve: The mitral valve is degenerative in appearance. Mild mitral annular calcification. No evidence of mitral valve regurgitation. No evidence of mitral valve stenosis. MV peak gradient, 7.3 mmHg. The mean mitral valve gradient is 3.0 mmHg. Tricuspid Valve: The tricuspid valve is normal in structure. Tricuspid valve regurgitation is mild . No evidence of tricuspid stenosis. Aortic Valve: The aortic valve is normal in structure. Aortic valve regurgitation is not visualized. No aortic stenosis is present. Pulmonic Valve: The pulmonic valve was normal in structure. Pulmonic valve regurgitation is trivial. No evidence of pulmonic stenosis. Aorta: The aortic root is normal in size and structure. Venous: The inferior vena cava was not well visualized. IAS/Shunts: No atrial level shunt detected by color flow Doppler.  LEFT VENTRICLE PLAX 2D LVIDd:         5.00 cm   Diastology LVIDs:          3.80 cm   LV e' medial:  5.82 cm/s LV PW:         1.25 cm   LV e' lateral: 7.70 cm/s LV IVS:        1.10 cm LVOT diam:     2.00 cm LV SV:         44 LV SV Index:   25 LVOT Area:     3.14 cm  LEFT ATRIUM           Index        RIGHT ATRIUM           Index LA diam:      3.80 cm 2.11 cm/m   RA Area:     18.70 cm LA Vol (A4C): 43.6 ml 24.22 ml/m  RA Volume:   53.80 ml  29.89 ml/m  AORTIC VALVE LVOT Vmax:   80.80 cm/s LVOT Vmean:  50.200 cm/s LVOT VTI:    0.141 m  AORTA Ao Root diam: 3.10 cm Ao Asc diam:  3.50 cm MITRAL VALVE              TRICUSPID VALVE MV Area VTI:  1.59 cm    TR Peak grad:   21.9 mmHg MV Peak grad: 7.3 mmHg    TR Vmax:        234.00 cm/s MV Mean grad: 3.0 mmHg MV Vmax:      1.35 m/s    SHUNTS MV Vmean:     70.4 cm/s   Systemic VTI:  0.14 m MR Peak grad: 84.3 mmHg   Systemic Diam: 2.00 cm MR Vmax:      459.00 cm/s Monica Zamora Electronically signed by Monica Zamora Signature Date/Time: 07/27/2024/4:22:50 PM    Final    DG Chest 1 View Result Date:  07/27/2024 EXAM: 1 VIEW(S) XRAY OF THE CHEST 07/27/2024 09:40:51 AM COMPARISON: 01/06/2024 CLINICAL HISTORY: sob FINDINGS: LUNGS AND PLEURA: Minimal bibasilar subsegmental atelectasis or scarring is noted. No focal pulmonary opacity. No pulmonary edema. No pleural effusion. No pneumothorax. HEART AND MEDIASTINUM: Stable cardiomediastinal silhouette. Sternotomy wires are again noted. BONES AND SOFT TISSUES: Sternotomy wires are again noted. No acute osseous abnormality. IMPRESSION: 1. Minimal bibasilar subsegmental atelectasis or scarring. Electronically signed by: Lynwood Seip MD 07/27/2024 09:56 AM EST RP Workstation: [id number]   Assessment and Plan:  Paroxysmal A-Fib with RVR Known prior history of A-Fib  Presented with rates as high as 180s Currently in A-Fib with HR 80-90s Home meds: Eliquis  5 mg BID, Toprol  25 mg daily  Started on IV diltiazem in the ER  Not good long-term amiodarone  candidate due to severe COPD Patient reports  she may have been in A-Fib with RVR for ~2 weeks Suspect GI is going to hold Eliquis  prior to undergoing GI workup for bleed EP may need to see post-GI workup if she remains in A-Fib to determine if she if Tikosyn candidate  Discontinued IV diltiazem given reduced EF Starting IV amiodarone  bolus and drip, okay for use short-term  Will re-evaluate and make further recommendations in AM/following GI workup   Chronic systolic CHF  Echo 7/24 EF 35-40%, moderately decreased RV systolic function Echo 02/2024: LVEF 50-55%, normal RV function  RHC 7/24: biventricular elevated filling pressures, low PAPi but preserved CI Pulmonary venous hypertension Suspect ischemic cardiomyopathy + component of cor pulmonale from COPD BNP 1,340 Home meds: PO Lasix  40 mg PRN (has been taking 20 mg every 5 days), Toprol  25 mg daily, Entresto  24-26 mg BID Spironolactone  stopped in past due to hyperkalemia SGLT2i deferred due to frequent UTIs Echo this admission shows LVEF 20-25%  Patient reports she may have been in A-Fib with RVR for ~2 weeks Patient does not appear volume overloaded on exam  Continue home Entresto  24-26 mg BID  Defer any diuresis for now as patient appears euvolemic   CAD s/p CABG x 3 in 2021  LIMA-LAD, SVG-OM3, SVG-PDA Home meds: ASA 81 mg daily, pravstatin 20 mg daily R/LHC 03/2023: patent grafts  No recent complain of exertional chest pain  Reports some stable shortness of breath   Per primary Anemia Positive FOBT CKD 3a Severe COPD Electrolyte disturbances   Risk Assessment/Risk Scores:      New [place]  Heart Association (NYHA) Functional Class NYHA Class I  CHA2DS2-VASc Score = 5   This indicates a 7.2% annual risk of stroke. The patient's score is based upon: CHF History: 1 HTN History: 1 Diabetes History: 0 Stroke History: 0 Vascular Disease History: 1 Age Score: 1 Gender Score: 1     For questions or updates, please contact [facility] Please consult  [email or web address] for contact info under   Signed, Waddell DELENA Donath, PA-C  07/27/2024 4:58 PM  [name] was seen by me today along with Waddell Donath, PA-C. I have personally performed an evaluation on this patient.  My findings are as follows:  73 y.o. female with history of COPD, CAD s/p CABG, carotid artery disease, HFrEF with recovered LVEF on prior echo, CKD stage IIIa, HTN and atrial fib admitted with 2 weeks of weakness and found to be in atrial fib with RVR and more anemia. Elevation of BNP but no LE edema or change in baseline dyspnea.  She was started on IV Cardizem in the ED and initially did not tolerate due  to hypotension but then it was restarted this afternoon and she has tolerated with HR 100-110.  Long history of atrial fib and not felt to be a candidate for long term amiodarone  therapy due to her lung disease.  Echo today with severe LV and RV dysfunction. No valve disease  Data: EKG(s) and pertinent labs, studies, etc were personally reviewed and interpreted by me:  I have personally reviewed the EKG: Atrial fib rate 145 bpm I have personally reviewed the telemetry: atrial fib rate 100-110 bpm I have personally reviewed the lab data Otherwise, I agree with data as outlined by the advanced practice provider.  Exam performed by me:  Gen: NAD Neck: No JVD Cardiac: Irreg irreg without a murmur Lungs: clear bilaterally but poor air movement Extremities: no LE edema  My Assessment and Plan:  Atrial fib with RVR: She is known to have atrial fib for years and has most recently maintained sinus rhythm on Toprol . She is on Eliquis  at home. She has had multiple cardioversions in the past and has not been felt to be a good candidate for long term amiodarone  therapy due to her severe lung disease.  Rate is controlled on Cardizem drip but given new finding of LV dysfunction tonight, will stop the Cardizem and start an IV amiodarone  drip for short term rate control.  Eliquis  being held tonight in setting of anemia and possible GI bleeding.  Will need to consider Tikosyn  Cardiomyopathy: Likely secondary to recurrent atrial fib, potentially persistent over the past several weeks.     Signed,  Lonni Cash, MD  07/27/2024 5:01 PM

## 2024-07-27 NOTE — ED Provider Notes (Signed)
 [place] EMERGENCY DEPARTMENT AT [facility] Provider Note   CSN: [id number] Arrival date & time: 07/27/24  9075     Patient presents with: Tachycardia   [name] is a 73 y.o. female.   73 year old female presents for evaluation of A-fib and shortness of breath.  States the A-fib has been going on for last couple weeks.  She states she has been compliant with her metoprolol  and her Eliquis .  She states this morning she was having some chest pain radiating to her right shoulder as well as shortness of breath and lightheadedness.  Heart rate by EMS noted to be at its highest 180.  Patient denies any other symptoms or concerns at this time.        Prior to Admission medications   Medication Sig Start Date End Date Taking? Authorizing Provider  acetaminophen  (TYLENOL ) 500 MG tablet Take 1,000 mg by mouth 2 (two) times daily as needed (pain).   Yes [provider]  albuterol  (VENTOLIN  HFA) 108 (90 Base) MCG/ACT inhaler Inhale 1 puff into the lungs 2 (two) times daily as needed for wheezing or shortness of breath. 07/10/21  Yes [provider]  apixaban  (ELIQUIS ) 5 MG TABS tablet Take 1 tablet (5 mg total) by mouth 2 (two) times daily. 08/12/23  Yes Pietro Redell RAMAN, MD  cyclobenzaprine (FLEXERIL) 10 MG tablet Take 10 mg by mouth at bedtime. 12/31/23  Yes [provider]  diazepam (VALIUM) 5 MG tablet Take 5 mg by mouth 2 (two) times daily as needed for anxiety. 11/06/23  Yes [provider]  furosemide  (LASIX ) 20 MG tablet Take 20 mg by mouth See admin instructions. Take 20mg  (1 tablet) by mouth every 5 days. 03/11/24  Yes [provider]  lidocaine  (LIDODERM ) 5 % Place 1 patch onto the skin daily as needed (Pain). 07/08/24  Yes [provider]  metoprolol  succinate (TOPROL -XL) 25 MG 24 hr tablet Take 1 tablet (25 mg total) by mouth at bedtime. Take with or immediately following a meal. 02/25/24  Yes Meng, Hao, PA   potassium chloride  SA (KLOR-CON  M) 20 MEQ tablet Take 1 tablet (20 mEq total) by mouth as needed. Only take potassium on the day you take lasix  Patient taking differently: Take 20 mEq by mouth See admin instructions. Take 20mEq (1 tablet) by mouth every 5 days. 02/25/24  Yes Meng, Hao, PA  pravastatin  (PRAVACHOL ) 20 MG tablet Take 1 tablet (20 mg total) by mouth daily at 6 PM. 11/30/23 02/03/25 Yes Meng, Hao, PA  sacubitril -valsartan  (ENTRESTO ) 24-26 MG Take 1 tablet by mouth 2 (two) times daily.   Yes [provider]  alendronate (FOSAMAX) 70 MG tablet Take 70 mg by mouth once a week. Patient not taking: Reported on 07/27/2024 07/06/24   [provider]  OXYGEN Inhale into the lungs as needed.    [provider]    Allergies: Patient has no known allergies.    Review of Systems  Constitutional:  Negative for chills and fever.  HENT:  Negative for ear pain and sore throat.   Eyes:  Negative for pain and visual disturbance.  Respiratory:  Positive for shortness of breath. Negative for cough.   Cardiovascular:  Positive for chest pain and palpitations.  Gastrointestinal:  Negative for abdominal pain and vomiting.  Genitourinary:  Negative for dysuria and hematuria.  Musculoskeletal:  Negative for arthralgias and back pain.  Skin:  Negative for color change and rash.  Neurological:  Negative for seizures and  syncope.  All other systems reviewed and are negative.   Updated Vital Signs BP (!) 133/90   Pulse (!) 132   Temp 97.8 F (36.6 C) (Oral)   Resp (!) 26   Ht 5' 3 (1.6 m)   Wt 76.7 kg   SpO2 98%   BMI 29.94 kg/m   Physical Exam Vitals and nursing note reviewed.  Constitutional:      General: She is not in acute distress.    Appearance: Normal appearance. She is well-developed. She is not ill-appearing.  HENT:     Head: Normocephalic and atraumatic.  Eyes:     Conjunctiva/sclera: Conjunctivae normal.  Cardiovascular:     Rate and Rhythm:  Tachycardia present. Rhythm irregular.     Heart sounds: No murmur heard. Pulmonary:     Effort: Pulmonary effort is normal. No respiratory distress.     Breath sounds: Normal breath sounds.  Abdominal:     Palpations: Abdomen is soft.     Tenderness: There is no abdominal tenderness.  Musculoskeletal:        General: No swelling.     Cervical back: Neck supple.  Skin:    General: Skin is warm and dry.     Capillary Refill: Capillary refill takes less than 2 seconds.  Neurological:     Mental Status: She is alert.  Psychiatric:        Mood and Affect: Mood normal.     (all labs ordered are listed, but only abnormal results are displayed) Labs Reviewed  BASIC METABOLIC PANEL WITH GFR - Abnormal; Notable for the following components:      Result Value   Potassium 5.2 (*)    CO2 14 (*)    BUN 28 (*)    Creatinine, Ser 1.17 (*)    Calcium  8.5 (*)    GFR, Estimated 50 (*)    Anion gap 16 (*)    All other components within normal limits  CBC WITH DIFFERENTIAL/PLATELET - Abnormal; Notable for the following components:   RBC 2.72 (*)    Hemoglobin 9.9 (*)    HCT 31.6 (*)    MCV 116.2 (*)    MCH 36.4 (*)    All other components within normal limits  BRAIN NATRIURETIC PEPTIDE - Abnormal; Notable for the following components:   B Natriuretic Peptide 1,340.4 (*)    All other components within normal limits  I-STAT CHEM 8, ED - Abnormal; Notable for the following components:   BUN 35 (*)    Creatinine, Ser 1.30 (*)    Calcium , Ion 1.09 (*)    TCO2 19 (*)    Hemoglobin 10.9 (*)    HCT 32.0 (*)    All other components within normal limits  POC OCCULT BLOOD, ED - Abnormal; Notable for the following components:   Fecal Occult Bld POSITIVE (*)    All other components within normal limits  MAGNESIUM   HEPATIC FUNCTION PANEL  IRON AND TIBC  TSH  FERRITIN  BASIC METABOLIC PANEL WITH GFR  VITAMIN B12  FOLATE  TROPONIN I (HIGH SENSITIVITY)  TROPONIN I (HIGH SENSITIVITY)     EKG: EKG Interpretation Date/Time:  Wednesday July 27 2024 09:33:20 EST Ventricular Rate:  145 PR Interval:    QRS Duration:  82 QT Interval:  295 QTC Calculation: 459 R Axis:   64  Text Interpretation: Atrial fibrillation with rapid ventricular response Low voltage, extremity and precordial leads Nonspecific T abnormalities, lateral leads Compared with prior EKG from 01/06/2024 Confirmed by  Gennaro Bouchard ([id number]) on 07/27/2024 9:39:34 AM  Radiology: DG Chest 1 View Result Date: 07/27/2024 EXAM: 1 VIEW(S) XRAY OF THE CHEST 07/27/2024 09:40:51 AM COMPARISON: 01/06/2024 CLINICAL HISTORY: sob FINDINGS: LUNGS AND PLEURA: Minimal bibasilar subsegmental atelectasis or scarring is noted. No focal pulmonary opacity. No pulmonary edema. No pleural effusion. No pneumothorax. HEART AND MEDIASTINUM: Stable cardiomediastinal silhouette. Sternotomy wires are again noted. BONES AND SOFT TISSUES: Sternotomy wires are again noted. No acute osseous abnormality. IMPRESSION: 1. Minimal bibasilar subsegmental atelectasis or scarring. Electronically signed by: Lynwood Seip MD 07/27/2024 09:56 AM EST RP Workstation: [id number]     .Critical Care  Performed by: Gennaro Bouchard CROME, DO Authorized by: Gennaro Bouchard CROME, DO   Critical care provider statement:    Critical care time (minutes):  32   Critical care time was exclusive of:  Separately billable procedures and treating other patients and teaching time   Critical care was necessary to treat or prevent imminent or life-threatening deterioration of the following conditions:  Cardiac failure and circulatory failure   Critical care was time spent personally by me on the following activities:  Development of treatment plan with patient or surrogate, discussions with consultants, evaluation of patient's response to treatment, examination of patient, interpretation of cardiac output measurements, ordering and performing treatments and interventions, ordering  and review of laboratory studies, ordering and review of radiographic studies, pulse oximetry, re-evaluation of patient's condition and review of old charts   Care discussed with: admitting provider      Medications Ordered in the ED  diltiazem (CARDIZEM) 1 mg/mL load via infusion 10 mg (10 mg Intravenous Bolus from Bag 07/27/24 0951)    And  diltiazem (CARDIZEM) 125 mg in dextrose  5% 125 mL (1 mg/mL) infusion (15 mg/hr Intravenous New Bag/Given 07/27/24 1437)  pantoprazole (PROTONIX) injection 40 mg (40 mg Intravenous Given 07/27/24 1449)  acetaminophen  (TYLENOL ) tablet 1,000 mg (has no administration in time range)  lidocaine  (LIDODERM ) 5 % 1 patch (has no administration in time range)  pravastatin  (PRAVACHOL ) tablet 20 mg (has no administration in time range)  sacubitril -valsartan  (ENTRESTO ) 24-26 mg per tablet (has no administration in time range)  sodium zirconium cyclosilicate (LOKELMA) packet 10 g (has no administration in time range)  ipratropium (ATROVENT) nebulizer solution 0.5 mg (has no administration in time range)  sodium chloride  0.9 % bolus 500 mL (0 mLs Intravenous Stopped 07/27/24 1056)  aspirin  chewable tablet 324 mg (324 mg Oral Given 07/27/24 1248)  nitroGLYCERIN (NITROGLYN) 2 % ointment 0.5 inch (0.5 inches Topical Given 07/27/24 1250)  fentaNYL (SUBLIMAZE) injection 50 mcg (50 mcg Intravenous Given 07/27/24 1249)                                    Medical Decision Making Cardiac monitor interpretation: A-fib with RVR, no ectopy   Patient for A-fib with RVR.  Initially heart rate in the 140s to 180s but improved with 1 dose of Cardizem.  She was still having chest pain.  Blood pressure did increase and heart rate went back up to the 140s to 150s.  She was started on a Cardizem drip and given nitro fentanyl and aspirin .  Troponin and lab workup was negative.  Patient is feeling much improved after pain medication.  Discussed patient's case with resident for Dr.  Eben, hospitalist, patient will be admitted for further workup and management.  Patient and family bedside agreeable to plan.  Results were  discussed with them.  Problems Addressed: Atrial fibrillation with rapid ventricular response (HCC): acute illness or injury that poses a threat to life or bodily functions Chest pain, unspecified type: undiagnosed new problem with uncertain prognosis  Amount and/or Complexity of Data Reviewed External Data Reviewed: notes.    Details: Outpatient records reviewed patient recently seen in the outpatient office for hip pain Labs: ordered. Decision-making details documented in ED Course.    Details: Ordered and reviewed by me and fairly unremarkable Radiology: ordered and independent interpretation performed. Decision-making details documented in ED Course.    Details: Ordered and inter by me independently radiology Chest X-ray: Shows no acute abnormality ECG/medicine tests: ordered and independent interpretation performed. Decision-making details documented in ED Course.    Details: Ordered and interpreted by me in absence of cardiology and shows A-fib with RVR, no STEMI or significant change when compared with prior Discussion of management or test interpretation with external provider(s): Dr. Eben - hospitalist - I spoke with resident from his service regarding the patient's case and patient will be admitted for further workup and management.  Risk OTC drugs. Prescription drug management. Drug therapy requiring intensive monitoring for toxicity. Decision regarding hospitalization. Risk Details: CRITICAL CARE Performed by: Duwaine LITTIE Fusi   Total critical care time: 32 minutes  Critical care time was exclusive of separately billable procedures and treating other patients.  Critical care was necessary to treat or prevent imminent or life-threatening deterioration.  Critical care was time spent personally by me on the following activities:  development of treatment plan with patient and/or surrogate as well as nursing, discussions with consultants, evaluation of patient's response to treatment, examination of patient, obtaining history from patient or surrogate, ordering and performing treatments and interventions, ordering and review of laboratory studies, ordering and review of radiographic studies, pulse oximetry and re-evaluation of patient's condition.     Final diagnoses:  Atrial fibrillation with rapid ventricular response (HCC)  Chest pain, unspecified type    ED Discharge Orders     None          Fusi Duwaine LITTIE, DO 07/27/24 1533

## 2024-07-27 NOTE — ED Notes (Signed)
 Pt heart rate decreased to 90s following Cardizem bolus and patient began to c/o heaviness in chest. Loreda, MD aware and verbally ordered to stop Cardizem drip. BP 117/64.

## 2024-07-27 NOTE — Plan of Care (Signed)
  Problem: Education: Goal: Knowledge of General Education information will improve Description: Including pain rating scale, medication(s)/side effects and non-pharmacologic comfort measures 07/27/2024 1904 by Linton Charleen PARAS, RN Outcome: Progressing 07/27/2024 1904 by Linton Charleen PARAS, RN Outcome: Progressing   Problem: Clinical Measurements: Goal: Diagnostic test results will improve Outcome: Progressing Goal: Respiratory complications will improve 07/27/2024 1904 by Linton Charleen PARAS, RN Outcome: Progressing 07/27/2024 1904 by Linton Charleen PARAS, RN Outcome: Progressing   Problem: Activity: Goal: Risk for activity intolerance will decrease 07/27/2024 1904 by Linton Charleen PARAS, RN Outcome: Progressing 07/27/2024 1904 by Linton Charleen PARAS, RN Outcome: Progressing

## 2024-07-27 NOTE — Plan of Care (Signed)
  Problem: Education: Goal: Knowledge of General Education information will improve Description: Including pain rating scale, medication(s)/side effects and non-pharmacologic comfort measures 07/27/2024 1910 by Linton Charleen PARAS, RN Outcome: Progressing 07/27/2024 1904 by Linton Charleen PARAS, RN Outcome: Progressing 07/27/2024 1904 by Linton Charleen PARAS, RN Outcome: Progressing   Problem: Clinical Measurements: Goal: Diagnostic test results will improve Outcome: Progressing Goal: Respiratory complications will improve 07/27/2024 1904 by Linton Charleen PARAS, RN Outcome: Progressing 07/27/2024 1904 by Linton Charleen PARAS, RN Outcome: Progressing   Problem: Activity: Goal: Risk for activity intolerance will decrease 07/27/2024 1904 by Linton Charleen PARAS, RN Outcome: Progressing 07/27/2024 1904 by Linton Charleen PARAS, RN Outcome: Progressing   Problem: Skin Integrity: Goal: Risk for impaired skin integrity will decrease Outcome: Progressing

## 2024-07-28 DIAGNOSIS — Z23 Encounter for immunization: Secondary | ICD-10-CM | POA: Diagnosis not present

## 2024-07-28 DIAGNOSIS — D539 Nutritional anemia, unspecified: Secondary | ICD-10-CM | POA: Diagnosis present

## 2024-07-28 DIAGNOSIS — I2510 Atherosclerotic heart disease of native coronary artery without angina pectoris: Secondary | ICD-10-CM | POA: Diagnosis present

## 2024-07-28 DIAGNOSIS — D649 Anemia, unspecified: Secondary | ICD-10-CM | POA: Diagnosis not present

## 2024-07-28 DIAGNOSIS — K2940 Chronic atrophic gastritis without bleeding: Secondary | ICD-10-CM | POA: Diagnosis present

## 2024-07-28 DIAGNOSIS — Z7982 Long term (current) use of aspirin: Secondary | ICD-10-CM | POA: Diagnosis not present

## 2024-07-28 DIAGNOSIS — I110 Hypertensive heart disease with heart failure: Secondary | ICD-10-CM | POA: Diagnosis not present

## 2024-07-28 DIAGNOSIS — K922 Gastrointestinal hemorrhage, unspecified: Secondary | ICD-10-CM | POA: Diagnosis not present

## 2024-07-28 DIAGNOSIS — D62 Acute posthemorrhagic anemia: Secondary | ICD-10-CM | POA: Diagnosis present

## 2024-07-28 DIAGNOSIS — K921 Melena: Secondary | ICD-10-CM | POA: Diagnosis not present

## 2024-07-28 DIAGNOSIS — I130 Hypertensive heart and chronic kidney disease with heart failure and stage 1 through stage 4 chronic kidney disease, or unspecified chronic kidney disease: Secondary | ICD-10-CM | POA: Diagnosis present

## 2024-07-28 DIAGNOSIS — J449 Chronic obstructive pulmonary disease, unspecified: Secondary | ICD-10-CM | POA: Diagnosis present

## 2024-07-28 DIAGNOSIS — E875 Hyperkalemia: Secondary | ICD-10-CM | POA: Diagnosis present

## 2024-07-28 DIAGNOSIS — Z860100 Personal history of colon polyps, unspecified: Secondary | ICD-10-CM | POA: Diagnosis not present

## 2024-07-28 DIAGNOSIS — K2950 Unspecified chronic gastritis without bleeding: Secondary | ICD-10-CM | POA: Diagnosis not present

## 2024-07-28 DIAGNOSIS — N1831 Chronic kidney disease, stage 3a: Secondary | ICD-10-CM | POA: Diagnosis present

## 2024-07-28 DIAGNOSIS — I3481 Nonrheumatic mitral (valve) annulus calcification: Secondary | ICD-10-CM | POA: Diagnosis present

## 2024-07-28 DIAGNOSIS — I959 Hypotension, unspecified: Secondary | ICD-10-CM | POA: Diagnosis not present

## 2024-07-28 DIAGNOSIS — F1090 Alcohol use, unspecified, uncomplicated: Secondary | ICD-10-CM | POA: Diagnosis present

## 2024-07-28 DIAGNOSIS — N183 Chronic kidney disease, stage 3 unspecified: Secondary | ICD-10-CM | POA: Diagnosis not present

## 2024-07-28 DIAGNOSIS — I2781 Cor pulmonale (chronic): Secondary | ICD-10-CM | POA: Diagnosis present

## 2024-07-28 DIAGNOSIS — Z951 Presence of aortocoronary bypass graft: Secondary | ICD-10-CM | POA: Diagnosis not present

## 2024-07-28 DIAGNOSIS — K633 Ulcer of intestine: Secondary | ICD-10-CM | POA: Diagnosis present

## 2024-07-28 DIAGNOSIS — E785 Hyperlipidemia, unspecified: Secondary | ICD-10-CM | POA: Diagnosis present

## 2024-07-28 DIAGNOSIS — I4891 Unspecified atrial fibrillation: Secondary | ICD-10-CM | POA: Diagnosis present

## 2024-07-28 DIAGNOSIS — I371 Nonrheumatic pulmonary valve insufficiency: Secondary | ICD-10-CM | POA: Diagnosis present

## 2024-07-28 DIAGNOSIS — D7589 Other specified diseases of blood and blood-forming organs: Secondary | ICD-10-CM | POA: Diagnosis present

## 2024-07-28 DIAGNOSIS — K279 Peptic ulcer, site unspecified, unspecified as acute or chronic, without hemorrhage or perforation: Secondary | ICD-10-CM | POA: Diagnosis not present

## 2024-07-28 DIAGNOSIS — I5042 Chronic combined systolic (congestive) and diastolic (congestive) heart failure: Secondary | ICD-10-CM | POA: Diagnosis present

## 2024-07-28 DIAGNOSIS — Z7901 Long term (current) use of anticoagulants: Secondary | ICD-10-CM | POA: Diagnosis not present

## 2024-07-28 DIAGNOSIS — Z9981 Dependence on supplemental oxygen: Secondary | ICD-10-CM | POA: Diagnosis not present

## 2024-07-28 DIAGNOSIS — I5043 Acute on chronic combined systolic (congestive) and diastolic (congestive) heart failure: Secondary | ICD-10-CM | POA: Diagnosis not present

## 2024-07-28 DIAGNOSIS — K5792 Diverticulitis of intestine, part unspecified, without perforation or abscess without bleeding: Secondary | ICD-10-CM | POA: Diagnosis not present

## 2024-07-28 DIAGNOSIS — I4819 Other persistent atrial fibrillation: Secondary | ICD-10-CM | POA: Diagnosis present

## 2024-07-28 DIAGNOSIS — K3189 Other diseases of stomach and duodenum: Secondary | ICD-10-CM | POA: Diagnosis not present

## 2024-07-28 DIAGNOSIS — D631 Anemia in chronic kidney disease: Secondary | ICD-10-CM | POA: Diagnosis present

## 2024-07-28 DIAGNOSIS — R93421 Abnormal radiologic findings on diagnostic imaging of right kidney: Secondary | ICD-10-CM | POA: Diagnosis not present

## 2024-07-28 DIAGNOSIS — J9611 Chronic respiratory failure with hypoxia: Secondary | ICD-10-CM | POA: Diagnosis present

## 2024-07-28 DIAGNOSIS — I5023 Acute on chronic systolic (congestive) heart failure: Secondary | ICD-10-CM | POA: Diagnosis not present

## 2024-07-28 DIAGNOSIS — E871 Hypo-osmolality and hyponatremia: Secondary | ICD-10-CM | POA: Diagnosis present

## 2024-07-28 DIAGNOSIS — K2289 Other specified disease of esophagus: Secondary | ICD-10-CM | POA: Diagnosis not present

## 2024-07-28 DIAGNOSIS — Z87891 Personal history of nicotine dependence: Secondary | ICD-10-CM | POA: Diagnosis not present

## 2024-07-28 DIAGNOSIS — I5020 Unspecified systolic (congestive) heart failure: Secondary | ICD-10-CM | POA: Diagnosis not present

## 2024-07-28 DIAGNOSIS — R933 Abnormal findings on diagnostic imaging of other parts of digestive tract: Secondary | ICD-10-CM | POA: Diagnosis not present

## 2024-07-28 DIAGNOSIS — D124 Benign neoplasm of descending colon: Secondary | ICD-10-CM | POA: Diagnosis present

## 2024-07-28 LAB — COMPREHENSIVE METABOLIC PANEL WITH GFR
ALT: 12 U/L (ref 0–44)
AST: 15 U/L (ref 15–41)
Albumin: 2.8 g/dL — ABNORMAL LOW (ref 3.5–5.0)
Alkaline Phosphatase: 111 U/L (ref 38–126)
Anion gap: 9 (ref 5–15)
BUN: 22 mg/dL (ref 8–23)
CO2: 20 mmol/L — ABNORMAL LOW (ref 22–32)
Calcium: 8.1 mg/dL — ABNORMAL LOW (ref 8.9–10.3)
Chloride: 103 mmol/L (ref 98–111)
Creatinine, Ser: 1.21 mg/dL — ABNORMAL HIGH (ref 0.44–1.00)
GFR, Estimated: 48 mL/min — ABNORMAL LOW (ref >60)
Glucose, Bld: 110 mg/dL — ABNORMAL HIGH (ref 70–99)
Potassium: 4.6 mmol/L (ref 3.5–5.1)
Sodium: 132 mmol/L — ABNORMAL LOW (ref 135–145)
Total Bilirubin: 0.7 mg/dL (ref 0.0–1.2)
Total Protein: 6.1 g/dL — ABNORMAL LOW (ref 6.5–8.1)

## 2024-07-28 LAB — CBC
HCT: 28.8 % — ABNORMAL LOW (ref 36.0–46.0)
Hemoglobin: 9.1 g/dL — ABNORMAL LOW (ref 12.0–15.0)
MCH: 35.7 pg — ABNORMAL HIGH (ref 26.0–34.0)
MCHC: 31.6 g/dL (ref 30.0–36.0)
MCV: 112.9 fL — ABNORMAL HIGH (ref 80.0–100.0)
Platelets: 277 K/uL (ref 150–400)
RBC: 2.55 MIL/uL — ABNORMAL LOW (ref 3.87–5.11)
RDW: 14.6 % (ref 11.5–15.5)
WBC: 6 K/uL (ref 4.0–10.5)
nRBC: 0 % (ref 0.0–0.2)

## 2024-07-28 LAB — MAGNESIUM: Magnesium: 1.9 mg/dL (ref 1.7–2.4)

## 2024-07-28 MED ORDER — METOPROLOL TARTRATE 12.5 MG HALF TABLET
12.5000 mg | ORAL_TABLET | Freq: Four times a day (QID) | ORAL | Status: DC
  Administered 2024-07-28 – 2024-08-02 (×15): 12.5 mg via ORAL
  Filled 2024-07-28 (×18): qty 1

## 2024-07-28 MED ORDER — METOPROLOL TARTRATE 12.5 MG HALF TABLET: 12.5000 mg | ORAL_TABLET | Freq: Four times a day (QID) | ORAL | Status: DC

## 2024-07-28 MED ORDER — METOPROLOL TARTRATE 12.5 MG HALF TABLET
12.5000 mg | ORAL_TABLET | Freq: Four times a day (QID) | ORAL | Status: DC
  Administered 2024-07-28: 12.5 mg via ORAL
  Filled 2024-07-28: qty 1

## 2024-07-28 NOTE — Care Management Obs Status (Signed)
 MEDICARE OBSERVATION STATUS NOTIFICATION   Patient Details  Name: ELIANYS CONRY MRN: [id number] Date of Birth: [birth year]   Medicare Observation Status Notification Given:       [name] 07/28/2024, 8:45 AM

## 2024-07-28 NOTE — Progress Notes (Signed)
 HD#0 SUBJECTIVE:  Patient Summary: Monica Zamora is a 73 year old female with a past medical history of Atrial fibrillation, HFimpEF (50-55% from 30-35% in July 2024), s/p CABG (2021), HTN, COPD (2L baseline), PUD, and alcohol use who presented to the ED for shortness of breath.   Interim History:  11/13 There were no acute events overnight. Vital signs had slightly lower tempertaures (97.3-97.6) and intermittent tachycardia to 130s. Hgb dropped ot 9.1 from 10.9. GI saw pt this morning and recommended monitoring HR throughout the day before deciding on whether or not to do the EGD. Could do EGD this evening or push it back to tomorrow. Cardiology to see this morning.  On interview pt reports that her tachycardia improved when her O2 was turned up to 3L, and that she was able to pee on her own after her bladder scan. She spoke with GI this morning and understands the plan that we will monitor her HR and coordinate with GI and Cardiology to determine timing for EGD and EP workup. She also informed us  that she is prescribed a PRN albuterol  inhaler for home use, but she never uses it. She is not on any other inhaler/nebulizer medications at home.    OBJECTIVE:  Vital Signs: Vitals:   07/27/24 2020 07/27/24 2316 07/28/24 0310 07/28/24 0317  BP: 119/77 135/74 106/75   Pulse: 96 77 (!) 118   Resp: 19 (!) 24 (!) 24   Temp: (!) 97.3 F (36.3 C) 97.6 F (36.4 C) (!) 97.5 F (36.4 C)   TempSrc: Oral Oral Oral   SpO2: 93% 92% 96%   Weight:    78 kg  Height:       Supplemental O2: Nasal Cannula 2L (on 2L [place] at home) SpO2: 96 % O2 Flow Rate (L/min): 2 L/min  Filed Weights   07/27/24 0933 07/28/24 0317  Weight: 76.7 kg 78 kg     Intake/Output Summary (Last 24 hours) at 07/28/2024 0711 Last data filed at 07/28/2024 0415 Gross per 24 hour  Intake 318.84 ml  Output 878 ml  Net -559.16 ml   Net IO Since Admission: -559.16 mL [07/28/24 0711]  Physical Exam: Physical Exam Vitals  and nursing note reviewed. Exam conducted with a chaperone present.  Constitutional:      General: She is not in acute distress.    Appearance: She is not toxic-appearing.  Cardiovascular:     Rate and Rhythm: Tachycardia present.     Heart sounds: Normal heart sounds.  Pulmonary:     Effort: Pulmonary effort is normal. No respiratory distress.     Breath sounds: Normal breath sounds.     Comments: 2L [place] Musculoskeletal:     Right lower leg: No edema.     Left lower leg: No edema.  Skin:    General: Skin is warm and dry.  Neurological:     Mental Status: She is alert.     Patient Lines/Drains/Airways Status     Active Line/Drains/Airways     Name Placement date Placement time Site Days   Peripheral IV 07/27/24 20 G Left;Posterior Hand 07/27/24  0906  Hand  1            Pertinent labs and imaging:      Latest Ref Rng & Units 07/28/2024    3:04 AM 07/27/2024   12:35 PM 07/27/2024    9:30 AM  CBC  WBC 4.0 - 10.5 K/uL 6.0   6.1   Hemoglobin 12.0 - 15.0  g/dL 9.1  89.0  9.9   Hematocrit 36.0 - 46.0 % 28.8  32.0  31.6   Platelets 150 - 400 K/uL 277   325        Latest Ref Rng & Units 07/28/2024    3:04 AM 07/27/2024    9:33 PM 07/27/2024   12:35 PM  CMP  Glucose 70 - 99 mg/dL 889  877  74   BUN 8 - 23 mg/dL 22  20  35   Creatinine 0.44 - 1.00 mg/dL 8.78  8.92  8.69   Sodium 135 - 145 mmol/L 132  133  136   Potassium 3.5 - 5.1 mmol/L 4.6  4.3  4.9   Chloride 98 - 111 mmol/L 103  104  110   CO2 22 - 32 mmol/L 20  20    Calcium  8.9 - 10.3 mg/dL 8.1  8.3    Total Protein 6.5 - 8.1 g/dL 6.1     Total Bilirubin 0.0 - 1.2 mg/dL 0.7     Alkaline Phos 38 - 126 U/L 111     AST 15 - 41 U/L 15     ALT 0 - 44 U/L 12       ECHOCARDIOGRAM COMPLETE Result Date: 07/27/2024    ECHOCARDIOGRAM REPORT   Patient Name:   Monica Zamora Date of Exam: 07/27/2024 Medical Rec #:  [id number]           Height:       63.0 in Accession #:    [id number]          Weight:        169.0 lb Date of Birth:  1950/12/01           BSA:          1.800 m Patient Age:    73 years            BP:           100/63 mmHg Patient Gender: F                   HR:           120 bpm. Exam Location:  Inpatient Procedure: 2D Echo, Cardiac Doppler, Color Doppler and Intracardiac            Opacification Agent (Both Spectral and Color Flow Doppler were            utilized during procedure). Indications:    A-fib  History:        Patient has prior history of Echocardiogram examinations, most                 recent 02/17/2024. CAD, Prior CABG, COPD, Arrythmias:Atrial                 Fibrillation, Signs/Symptoms:Chest Pain; Risk Factors:Former                 Smoker and Hypertension.  Sonographer:    Juliene Rucks Referring Phys: [id number] MEGAN L KAMMERER IMPRESSIONS  1. Left ventricular ejection fraction, by estimation, is 20 to 25%. The left ventricle has severely decreased function. The left ventricle demonstrates global hypokinesis. Left ventricular diastolic function could not be evaluated. Elevated left atrial pressure.  2. Right ventricular systolic function is severely reduced. The right ventricular size is normal. Tricuspid regurgitation signal is inadequate for assessing PA pressure.  3. The mitral valve is degenerative. No evidence of mitral valve regurgitation. No evidence of mitral stenosis.  4. The aortic valve is normal in structure. Aortic valve regurgitation is not visualized. No aortic stenosis is present. Comparison(s): Changes from prior study are noted. LV function has worsened. FINDINGS  Left Ventricle: Left ventricular ejection fraction, by estimation, is 20 to 25%. The left ventricle has severely decreased function. The left ventricle demonstrates global hypokinesis. Definity contrast agent was given IV to delineate the left ventricular endocardial borders. The left ventricular internal cavity size was normal in size. There is no left ventricular hypertrophy. Left ventricular diastolic function  could not be evaluated due to atrial fibrillation. Left ventricular diastolic function could not be evaluated. Elevated left atrial pressure. Right Ventricle: The right ventricular size is normal. No increase in right ventricular wall thickness. Right ventricular systolic function is severely reduced. Tricuspid regurgitation signal is inadequate for assessing PA pressure. Left Atrium: Left atrial size was normal in size. Right Atrium: Right atrial size was normal in size. Pericardium: There is no evidence of pericardial effusion. Mitral Valve: The mitral valve is degenerative in appearance. Mild mitral annular calcification. No evidence of mitral valve regurgitation. No evidence of mitral valve stenosis. MV peak gradient, 7.3 mmHg. The mean mitral valve gradient is 3.0 mmHg. Tricuspid Valve: The tricuspid valve is normal in structure. Tricuspid valve regurgitation is mild . No evidence of tricuspid stenosis. Aortic Valve: The aortic valve is normal in structure. Aortic valve regurgitation is not visualized. No aortic stenosis is present. Pulmonic Valve: The pulmonic valve was normal in structure. Pulmonic valve regurgitation is trivial. No evidence of pulmonic stenosis. Aorta: The aortic root is normal in size and structure. Venous: The inferior vena cava was not well visualized. IAS/Shunts: No atrial level shunt detected by color flow Doppler.  LEFT VENTRICLE PLAX 2D LVIDd:         5.00 cm   Diastology LVIDs:         3.80 cm   LV e' medial:  5.82 cm/s LV PW:         1.25 cm   LV e' lateral: 7.70 cm/s LV IVS:        1.10 cm LVOT diam:     2.00 cm LV SV:         44 LV SV Index:   25 LVOT Area:     3.14 cm  LEFT ATRIUM           Index        RIGHT ATRIUM           Index LA diam:      3.80 cm 2.11 cm/m   RA Area:     18.70 cm LA Vol (A4C): 43.6 ml 24.22 ml/m  RA Volume:   53.80 ml  29.89 ml/m  AORTIC VALVE LVOT Vmax:   80.80 cm/s LVOT Vmean:  50.200 cm/s LVOT VTI:    0.141 m  AORTA Ao Root diam: 3.10 cm Ao Asc  diam:  3.50 cm MITRAL VALVE              TRICUSPID VALVE MV Area VTI:  1.59 cm    TR Peak grad:   21.9 mmHg MV Peak grad: 7.3 mmHg    TR Vmax:        234.00 cm/s MV Mean grad: 3.0 mmHg MV Vmax:      1.35 m/s    SHUNTS MV Vmean:     70.4 cm/s   Systemic VTI:  0.14 m MR Peak grad: 84.3 mmHg   Systemic Diam: 2.00 cm MR Vmax:  459.00 cm/s Morene Brownie Electronically signed by Morene Brownie Signature Date/Time: 07/27/2024/4:22:50 PM    Final    DG Chest 1 View Result Date: 07/27/2024 EXAM: 1 VIEW(S) XRAY OF THE CHEST 07/27/2024 09:40:51 AM COMPARISON: 01/06/2024 CLINICAL HISTORY: sob FINDINGS: LUNGS AND PLEURA: Minimal bibasilar subsegmental atelectasis or scarring is noted. No focal pulmonary opacity. No pulmonary edema. No pleural effusion. No pneumothorax. HEART AND MEDIASTINUM: Stable cardiomediastinal silhouette. Sternotomy wires are again noted. BONES AND SOFT TISSUES: Sternotomy wires are again noted. No acute osseous abnormality. IMPRESSION: 1. Minimal bibasilar subsegmental atelectasis or scarring. Electronically signed by: Lynwood Seip MD 07/27/2024 09:56 AM EST RP Workstation: [id number]    ASSESSMENT/PLAN:  Assessment: Principal Problem:   Atrial fibrillation with RVR (HCC) Active Problems:   COPD (chronic obstructive pulmonary disease) (HCC)   CKD stage 3a, GFR 45-59 ml/min (HCC)   HFrEF (heart failure with reduced ejection fraction) (HCC)   Hyperkalemia   Melena   Anemia   Alcohol use   Chest pain   Chronic respiratory failure (HCC)   Anticoagulated   Plan: # Atrial Fibrillation with RVR #HFrEF LVEF 20-25%  # Chest Pain # CAD s/p CABG x3 2021 Patient presented to the emergency department with A-fib RVR and was started on a Cardizem drip for rate control.  On evaluation of patient's A-fib with RVR, echocardiogram was obtained which revealed a LVEF of 20-25% which is reduced from a LVEF of 50 to 55% in June 2025.  I suspect that patient's A-fib with RVR is due to her  suspected GI bleed with evidence of melena and drop in hemoglobin.  And I suspect that her recent decrease in LVEF is due to uncontrolled A-fib RVR over this past month.  Patient is euvolemic on exam and has warm exam extremities, low concern for cardiogenic shock at this time. Plan: - Cardiology consulted, appreciate recs             - Stopped diltiazem             - Starting IV Amiodarone  bolus and drip, ok for short-term use             - Coordinating with GI             - Home ASA 81mg  and Pravastatin  20mg  daily - Hold eliquis  in setting of GI bleed - Holding Spiro with Hyperkalemia  - Hold Metoprolol   - Holding Lasix , patient is euvolemic at this time    # Melena # PUD # Anemia Pt had a history of recent black tarry stools and recent Hgb drop from 11.3-->9.9. Bedside FOBT was positive. Suspect this is due to her Eliquis , NSAIDs and ASA use as well as her history of drinking hard liquor regularly.  - GI consulted, appreciate recs            - EGD delayed from this morning for concern of tachycardia, postponing to either this evening or maybe tomorrow. May pursue colonoscopy later. - Protonix 40mg  IV Q12H - Holding Eliquis    # COPD # Chronic Respiratory Failure Pt has a history of COPD, is on chronic O2 at home, but not taking any inhalers. No concern for acute exacerbation at this time.  - Pulmicort nebulizers 0.25mg  BID - Ipratropium 0.5mg  nebulizer Q4H PRN   # CKD Stage 3 #Hyperkalemia Hx of CKD - Monitor Creatinine, caution with nephrotoxic medications - Hold spironolactone  - s/p lokelma   #Alcohol Use -CIWA without ativan    Diet: CLD now, NPO after  midnight VTE: SCDs Code: Full  Disposition planning: DISPO: Unclear discharge date. Pending clinical improvement.  Signature:  Penne Lera [facility]  7:11 AM, 07/28/2024  On Call pager [phone]

## 2024-07-28 NOTE — Progress Notes (Addendum)
 Progress Note  Patient Name: Monica Zamora Date of Encounter: 07/28/2024 [place] Zamora Cardiologist: Monica Shallow, MD   Interval Summary    Her main symptom coming into this admission was right neck/shoulder pain.  Did report worsening DOE over the last 2 weeks that improved with rest. Did get lightheaded during these episodes though did not fall/pass out.  Denied noticing palpitations prior to admission.  Did have an instance of chest heaviness in the ED improved by nitroglycerin.  Had an episode last night of palpitations, SOB, and chest heaviness all improved with increased in oxygen on 3L. Last took eliquis  11/12 am.   Vital Signs Vitals:   07/27/24 2316 07/28/24 0310 07/28/24 0317 07/28/24 0740  BP: 135/74 106/75  126/78  Pulse: 77 (!) 118  60  Resp: (!) 24 (!) 24  18  Temp: 97.6 F (36.4 C) (!) 97.5 F (36.4 C)  (!) 97.4 F (36.3 C)  TempSrc: Oral Oral  Oral  SpO2: 92% 96%  100%  Weight:   78 kg   Height:        Intake/Output Summary (Last 24 hours) at 07/28/2024 0921 Last data filed at 07/28/2024 0415 Gross per 24 hour  Intake 318.84 ml  Output 878 ml  Net -559.16 ml      07/28/2024    3:17 AM 07/27/2024    9:33 AM 04/26/2024    3:02 PM  Last 3 Weights  Weight (lbs) 171 lb 15.3 oz 169 lb 169 lb  Weight (kg) 78 kg 76.658 kg 76.658 kg      Telemetry/ECG  AF RVR 120s - Personally Reviewed  Physical Exam  GEN: No acute distress.   Neck: No JVD Cardiac: irregularly irregular, no murmurs, rubs, or gallops.  Respiratory: Diminished bilaterally GI: Soft, mild tenderness to RUQ and hypogastric area, non-distended  MS: No edema  Assessment & Plan  Monica Zamora is a 73 y.o. female with a hx of CAD s/p CABG 2021, carotid artery disease, chronic HFrEF with recovered EF, paroxysmal atrial fibrillation s/p LAA clipping, renal artery stenosis, mesenteric artery stenosis, CKD 3a, severe COPD on home oxygen and hypertension who presented to  the ED on 11/12 for progressive shortness of breath on exertion, palpitations, and right arm pain. On arrival patient noted to be in AF RVR HR 145. K 5.2. BNP 1340. Troponin negative. CXR without evidence of volume overload. He was started on IV dilt, IV fluids and given ASA load, nitroglycerin paste, and lokelma. Cardiology consulted.   AF RVR Given concern for possible GI bleed, anticoagulation on hold. She has been reccommended for EGD for further evaluation, though EGD on hold until HR is better controlled.  Chad Vasc score 5 Currently in AF RVR   Patient was placed on IV amiodarone  this admission, not a good long term candidate will pursue for short term 2/2 lung disease. On chart review previously spoke about Tikosyn initiation if recurrent AF. Unfortunately unsure of the chronicity of this AF episode as patient has been unaware of palpitations and anticoagulation is on hold. For now will pursue rate control. BP has been low-normal. Most recent 126/78. Will pause entresto  for now to allow for more BP room.   Continue IV amiodarone  Start metoprolol  tartrate 12.5 mg q6h with hold parameters, will up-titrate as allowed and consolidate at discharge.    Previously HFimEF [02/2024 50-55%] now systolic HF with RV dysfunction [previous RV dysfxn in 2024] Etiology suspected 2/2  ischemic cardiomyopathy + component of cor pulmonale  from COPD  Spironolactone  stopped in past due to hyperkalemia SGLT2i deferred due to frequent UTI Dry weight 169lb On exam appears, euvolemic   Suspect current EF reduction is tachy-mediated.   Hold entresto  24-26 mg BID for now to allow for BP room to achieve HR reduction.  Patient confirmed taking metoprolol  into this admission, will restart as above.  Defer MRA and SGLT2i as above.   CAD  s/p CABG x3 in 2021 [LIMA-LAD, SVG-OM3, SVG-PDA] [facility] 03/2023: patent grafts Currently chest pain free, did describe two episodes of chest heaviness in the context of AF RVR, no  CP prior to admission.   ASA 81 mg on hold 2/2 possible GI bleed, though given patient is to be on chronic anticoagulation as well unsure if she should also be on antiplatelet therapy going forward.   Hyperlipidemia  Lipid panel ordered for am.  Lipoprotein (a) in [id number] Continue pravastatin  20 mg   Per primary Anemia Positive FOBT CKD 3a Severe COPD Electrolyte disturbances   For questions or updates, please contact Monica Zamora Please consult [email or web address] for contact info under       Signed, Monica LOISE Salen, PA-C   I have personally evaluated and examined the patient. The history, physical exam, and medical decision making documented below were performed independently and substantively by me. I have reviewed all relevant data, formulated the assessment and plan, and assumed responsibility for the management of this patient. My documentation reflects the substantive portion of the split/shared visit, in accordance with CMS and CPT guidelines. I have updated the NPP documentation above as appropriate; reviewed at length with Monica Zamora prior to the above note and plan was completed.  I have personally performed the substantive portion of the medical decision making, including interpretation of diagnostic data, formulation of the management plan, and assessment of risks. In summation:  Monica Zamora is a 73 year old with coronary artery disease and severe COPD who presents with tachycardia and shortness of breath. She is a patient of Doctor Monica Zamora.  She was seen in the emergency room due to a positive fecal occult test, tachycardia, and shortness of breath; yesterday by Dr. Verlin. Her medical history includes coronary artery disease, status post bypass grafting, prior carotid artery disease, heart failure with recovered ejection fraction, paroxysmal atrial fibrillation status post an incomplete left atrial appendage ligation, renal artery stenosis, mesenteric artery  ischemia, and severe COPD.  She is normally on home oxygen due to severe COPD and is dependent on it. She was being considered for possible Tikosyn load as an outpatient.  She was put on IV amiodarone  due to difficulty with rate control, and her other goal-directed medical therapy was held to allow for more blood pressure room.  She is not on an SGLT2 inhibitor due to frequent UTIs and is not on digoxin or spironolactone  due to hyperkalemia on admission.  At evaluation today no CP, SOB, palpitations.  Exam notable for  GENERAL: No acute distress. NECK: No jugular venous distention. CHEST: Diminished breath sounds bilaterally. Sternotomy scar present. CARDIOVASCULAR: Irregular rhythm with tachycardia and with systolic murmur. ABDOMEN: No upper quadrant tenderness. EXTREMITIES: No edema.  Personally reviewed data and interpretation:   Fecal occult blood: Positive (07/27/2024)  RADIOLOGY CT aortic protocol: Coronary artery calcifications; left atrial appendage ligation with residual stump >50mm (2024)  In assessment and plan:   Paroxysmal atrial fibrillation with rapid ventricular response and incomplete left atrial appendage ligation Paroxysmal atrial fibrillation with rapid ventricular response, initially with rates up  to 120 bpm, later improving to 108 bpm. Incomplete left atrial appendage ligation with a residual stump greater than 5 mm. New systolic dysfunction likely related to rapid ventricular response, expected to improve with rhythm control or heart rate improvement. Not on digoxin due to hyperkalemia. - Continue IV amiodarone  - Started metoprolol  tartrate 12.5 mg Q6H and will titrate as needed - Will consolidate metoprolol  at discharge  Heart failure with recovered ejection fraction and now, with RVR, new systolic dysfunction - Continue IV amiodarone  - Started metoprolol  tartrate 12.5 mg Q6H and will titrate as needed - ARNI stopped but hope to restart at DC - cannot  tolerate SGLT2i and MRA  Coronary artery disease status post coronary artery bypass grafting Coronary artery disease with coronary artery calcifications noted on imaging. Status post coronary artery bypass grafting. - she notes that she was taking no AC and only ASA; if cleared by GI recommend DOAC monotherapy   Severe chronic obstructive pulmonary disease, oxygen dependent Severe COPD with oxygen dependency. Diminished breath sounds bilaterally. No acute distress or shortness of breath on examination.  Hyperkalemia Noted on admission, precluding the use of digoxin and spironolactone . - Avoid digoxin and spironolactone   Stanly Leavens, MD FASE [facility] Cardiologist [facility]  [address] [place], [address] [place], [place] [address] [phone]  12:25 PM

## 2024-07-28 NOTE — TOC CM/SW Note (Signed)
 Transition of Care [facility]) - Inpatient Brief Assessment   Patient Details  Name: Monica Zamora MRN: [id number] Date of Birth: 04-16-1951  [facility]) CM/SW Contact:    Luise JAYSON Pan, LCSWA Phone Number: 07/28/2024, 1:30 PM   Clinical Narrative: Per  chart review, patient admitted from home (lives with friends). Patient has PCP and insurance on file. Patient provided the following DME during previous admissions: 3n1 bedside commode ([employer]), nebulizer, and portable O2 tank. Patient set up with [facility] w/ [facility] under previous admission.   Transition of Care Asessment: Insurance and Status: Insurance coverage has been reviewed Patient has primary care physician: Yes Home environment has been reviewed: from home Prior level of function:: indpendent Prior/Current Home Services: No current home services Social Drivers of Health Review: SDOH reviewed no interventions necessary Readmission risk has been reviewed: Yes Transition of care needs: no transition of care needs at this time

## 2024-07-28 NOTE — Progress Notes (Signed)
 Progress Note   Subjective  Patient denies any bleeding overnight.  Nursing denies any bleeding symptoms overnight.  She has had slight uptrend in her oxygen demand, heart rate was better controlled overnight but now that she is waking up has risen into 120s.  Blood pressure stable.   Objective   Vital signs in last 24 hours: Temp:  [97.3 F (36.3 C)-97.8 F (36.6 C)] 97.5 F (36.4 C) (11/13 0310) Pulse Rate:  [55-160] 118 (11/13 0310) Resp:  [16-31] 24 (11/13 0310) BP: (101-150)/(64-110) 106/75 (11/13 0310) SpO2:  [92 %-100 %] 96 % (11/13 0310) Weight:  [76.7 kg-78 kg] 78 kg (11/13 0317) Last BM Date : 07/27/24 General:    white female in NAD Neurologic:  Alert and oriented,  grossly normal neurologically. Psych:  Cooperative. Normal mood and affect.  Intake/Output from previous day: 11/12 0701 - 11/13 0700 In: 318.8 [I.V.:318.8] Out: 878 [Urine:878] Intake/Output this shift: No intake/output data recorded.  Lab Results: Recent Labs    07/27/24 0930 07/27/24 1235 07/28/24 0304  WBC 6.1  --  6.0  HGB 9.9* 10.9* 9.1*  HCT 31.6* 32.0* 28.8*  PLT 325  --  277   BMET Recent Labs    07/27/24 0930 07/27/24 1235 07/27/24 2133 07/28/24 0304  NA 136 136 133* 132*  K 5.2* 4.9 4.3 4.6  CL 106 110 104 103  CO2 14*  --  20* 20*  GLUCOSE 74 74 122* 110*  BUN 28* 35* 20 22  CREATININE 1.17* 1.30* 1.07* 1.21*  CALCIUM  8.5*  --  8.3* 8.1*   LFT Recent Labs    07/27/24 0930 07/28/24 0304  PROT 6.5 6.1*  ALBUMIN 3.2* 2.8*  AST 22 15  ALT 10 12  ALKPHOS 123 111  BILITOT 0.9 0.7  BILIDIR 0.3*  --   IBILI 0.6  --    PT/INR No results for input(s): LABPROT, INR in the last 72 hours.  Studies/Results: ECHOCARDIOGRAM COMPLETE Result Date: 07/27/2024    ECHOCARDIOGRAM REPORT   Patient Name:   ELANIA CROWL Date of Exam: 07/27/2024 Medical Rec #:  [id number]           Height:       63.0 in Accession #:    [id number]          Weight:       169.0 lb  Date of Birth:  23-Aug-1951           BSA:          1.800 m Patient Age:    73 years            BP:           100/63 mmHg Patient Gender: F                   HR:           120 bpm. Exam Location:  Inpatient Procedure: 2D Echo, Cardiac Doppler, Color Doppler and Intracardiac            Opacification Agent (Both Spectral and Color Flow Doppler were            utilized during procedure). Indications:    A-fib  History:        Patient has prior history of Echocardiogram examinations, most                 recent 02/17/2024. CAD, Prior CABG, COPD, Arrythmias:Atrial  Fibrillation, Signs/Symptoms:Chest Pain; Risk Factors:Former                 Smoker and Hypertension.  Sonographer:    Juliene Rucks Referring Phys: [id number] MEGAN L KAMMERER IMPRESSIONS  1. Left ventricular ejection fraction, by estimation, is 20 to 25%. The left ventricle has severely decreased function. The left ventricle demonstrates global hypokinesis. Left ventricular diastolic function could not be evaluated. Elevated left atrial pressure.  2. Right ventricular systolic function is severely reduced. The right ventricular size is normal. Tricuspid regurgitation signal is inadequate for assessing PA pressure.  3. The mitral valve is degenerative. No evidence of mitral valve regurgitation. No evidence of mitral stenosis.  4. The aortic valve is normal in structure. Aortic valve regurgitation is not visualized. No aortic stenosis is present. Comparison(s): Changes from prior study are noted. LV function has worsened. FINDINGS  Left Ventricle: Left ventricular ejection fraction, by estimation, is 20 to 25%. The left ventricle has severely decreased function. The left ventricle demonstrates global hypokinesis. Definity contrast agent was given IV to delineate the left ventricular endocardial borders. The left ventricular internal cavity size was normal in size. There is no left ventricular hypertrophy. Left ventricular diastolic function could not  be evaluated due to atrial fibrillation. Left ventricular diastolic function could not be evaluated. Elevated left atrial pressure. Right Ventricle: The right ventricular size is normal. No increase in right ventricular wall thickness. Right ventricular systolic function is severely reduced. Tricuspid regurgitation signal is inadequate for assessing PA pressure. Left Atrium: Left atrial size was normal in size. Right Atrium: Right atrial size was normal in size. Pericardium: There is no evidence of pericardial effusion. Mitral Valve: The mitral valve is degenerative in appearance. Mild mitral annular calcification. No evidence of mitral valve regurgitation. No evidence of mitral valve stenosis. MV peak gradient, 7.3 mmHg. The mean mitral valve gradient is 3.0 mmHg. Tricuspid Valve: The tricuspid valve is normal in structure. Tricuspid valve regurgitation is mild . No evidence of tricuspid stenosis. Aortic Valve: The aortic valve is normal in structure. Aortic valve regurgitation is not visualized. No aortic stenosis is present. Pulmonic Valve: The pulmonic valve was normal in structure. Pulmonic valve regurgitation is trivial. No evidence of pulmonic stenosis. Aorta: The aortic root is normal in size and structure. Venous: The inferior vena cava was not well visualized. IAS/Shunts: No atrial level shunt detected by color flow Doppler.  LEFT VENTRICLE PLAX 2D LVIDd:         5.00 cm   Diastology LVIDs:         3.80 cm   LV e' medial:  5.82 cm/s LV PW:         1.25 cm   LV e' lateral: 7.70 cm/s LV IVS:        1.10 cm LVOT diam:     2.00 cm LV SV:         44 LV SV Index:   25 LVOT Area:     3.14 cm  LEFT ATRIUM           Index        RIGHT ATRIUM           Index LA diam:      3.80 cm 2.11 cm/m   RA Area:     18.70 cm LA Vol (A4C): 43.6 ml 24.22 ml/m  RA Volume:   53.80 ml  29.89 ml/m  AORTIC VALVE LVOT Vmax:   80.80 cm/s LVOT Vmean:  50.200 cm/s LVOT  VTI:    0.141 m  AORTA Ao Root diam: 3.10 cm Ao Asc diam:  3.50  cm MITRAL VALVE              TRICUSPID VALVE MV Area VTI:  1.59 cm    TR Peak grad:   21.9 mmHg MV Peak grad: 7.3 mmHg    TR Vmax:        234.00 cm/s MV Mean grad: 3.0 mmHg MV Vmax:      1.35 m/s    SHUNTS MV Vmean:     70.4 cm/s   Systemic VTI:  0.14 m MR Peak grad: 84.3 mmHg   Systemic Diam: 2.00 cm MR Vmax:      459.00 cm/s Morene Brownie Electronically signed by Morene Brownie Signature Date/Time: 07/27/2024/4:22:50 PM    Final    DG Chest 1 View Result Date: 07/27/2024 EXAM: 1 VIEW(S) XRAY OF THE CHEST 07/27/2024 09:40:51 AM COMPARISON: 01/06/2024 CLINICAL HISTORY: sob FINDINGS: LUNGS AND PLEURA: Minimal bibasilar subsegmental atelectasis or scarring is noted. No focal pulmonary opacity. No pulmonary edema. No pleural effusion. No pneumothorax. HEART AND MEDIASTINUM: Stable cardiomediastinal silhouette. Sternotomy wires are again noted. BONES AND SOFT TISSUES: Sternotomy wires are again noted. No acute osseous abnormality. IMPRESSION: 1. Minimal bibasilar subsegmental atelectasis or scarring. Electronically signed by: Lynwood Seip MD 07/27/2024 09:56 AM EST RP Workstation: [id number]       Assessment / Plan:    73 year old female here with the following:  History of dark stools concerning for melena Anemia Anticoagulated / NSAID use A-fib with RVR COPD on supplemental oxygen  See intake note for full history.  In the ED yesterday her heart rates appeared somewhat better however low 100s overnight and now into the 120s consistently this morning on rounds.  Blood pressure stable.  She has not had any overt bleeding since she has been here.  Hemoglobin has drifted.  BUN is coming down which is reassuring.  She is on Eliquis , last taken about 24 hours ago, had been taking NSAIDs and aspirin  on top of this without any PPI prophylaxis.  PUD is high on the differential.  We have given her empiric IV Protonix.  EGD is recommended to clear her upper tract when she is stable from it from cardio  pulmonary perspective.  Right now, her heart rates remain rather high for elective anesthesia/EGD, will see how she does this morning and if HR can become better controlled we could potentially do her case later this morning or this afternoon.  If she needs more time for heart rate control today, we can perform her procedure tomorrow.  If it looks like her case will not happen today would start her back on clear liquids and make n.p.o. after midnight.  We previously had discussed colonoscopy in the ER yesterday.  She has never had a colonoscopy and states she never wants one.  If the EGD reveals clear pathology for her anemia she would not wish to pursue a colonoscopy.  If the EGD shows no source for her symptoms then she would consider it while inpatient.  PLAN: - EGD when stable from it - HRs remain too elevated this AM, will see how she does this AM and if improved consider EGD later today, or if she needs more time can do it tomorrow - NPO for now while we await her course this AM, if case does not happen today would give clear liquids and then NPO after MN again - continue IV protonix BID -  daily Hgb / BUN, monitor for overt bleeding - holding Eliquis  for now - colonoscopy recommended as above, she declines for now.  Will follow, call with questions.  Marcey Naval, MD [facility]

## 2024-07-28 NOTE — Progress Notes (Signed)
   Heart Failure Stewardship Pharmacist Progress Note   PCP: Baird Comer GAILS, NP PCP-Cardiologist: Redell Shallow, MD    HPI:  73 yo F with PMH of afib, CHF, CAD s/p CABG, HTN, COPD, PUD, and alcohol and tobacco use.   Presented to the ED on 11/12 with R arm pain, chest pain, shortness of breath, palpitations, and lightheadedness. Found to be in afib RVR and was given diltiazem. K 5.2 - received lokelma. ECHO on 11/12 with LVEF 20-25%, global hypokinesis, RV severely reduced. Last ECHO in June with EF 50-55% but was 30-35% in July 2024 secondary to afib. Underwent cardioversion at that time.   Also noted to have recent dark and tarry stools and drop in Hgb. GI has been consulted. Will need EGD when stable form a cardiac standpoint. She declines colonoscopy.  She is not on Farxiga  or Jardiance due to recurrent UTI. She is not on spironolactone  due to hyperkalemia. She has been taking lasix  every 3-5 days at home.  Today she says her breathing has improved. Currently on 2L O2 (2L O2 at home). Denies chest pain. Feels like she is still somewhat symptomatic on exertion. No LE edema.   Current HF Medications: Beta Blocker: metoprolol  tartrate 12.5 mg q6h  Prior to admission HF Medications: Diuretic: furosemide  20 mg every 3-5 days ACE/ARB/ARNI: Entresto  24/26 mg BID  Pertinent Lab Values: Serum creatinine 1.21, BUN 22, Potassium 4.6, Sodium 132, BNP 1340.4, Magnesium  1.9   Vital Signs: Weight: 171 lbs (admission weight: 170 lbs) Blood pressure: 90-120/80s  Heart rate: 100-120s  I/O: net -0.6L yesterday; net -0.6L since admission  Medication Assistance / Insurance Benefits Check: Does the patient have prescription insurance?  Yes Type of insurance plan: UHC Medicare + Medicaid  Outpatient Pharmacy:  Prior to admission outpatient pharmacy: [facility] - gets meds delivered Is the patient willing to use [facility] TOC pharmacy at discharge? Yes Is the patient willing to transition  their outpatient pharmacy to utilize a [facility] outpatient pharmacy?   No    Assessment: 1. Chronic systolic CHF (LVEF 20-25%). NYHA class I symptoms. - Does not appear volume overloaded on exam - Agree with starting metoprolol  12.5 mg q6h - Holding Entresto  to allow for more BP room to achieve HR reduction. May need to restart pending improvement in HR. - No spironolactone  with history of hyperkalemia - No SGLT2i with history of recurrent UTIs    Plan: 1) Medication changes recommended at this time: - Agree with adding BB  2) Patient assistance: - None pending  3)  Education  - Initial education completed - Full education to be completed prior to discharge  Duwaine Plant, PharmD, BCPS Heart Failure Engineer, Building Services Phone [phone]

## 2024-07-29 ENCOUNTER — Telehealth (HOSPITAL_COMMUNITY): Payer: Self-pay | Admitting: Pharmacy Technician

## 2024-07-29 ENCOUNTER — Inpatient Hospital Stay (HOSPITAL_COMMUNITY): Admitting: Certified Registered"

## 2024-07-29 ENCOUNTER — Other Ambulatory Visit (HOSPITAL_COMMUNITY): Payer: Self-pay

## 2024-07-29 ENCOUNTER — Encounter (HOSPITAL_COMMUNITY)
Admission: EM | Disposition: A | Discharge status: Home Health | Payer: Self-pay | Source: Home / Self Care | Attending: Infectious Diseases

## 2024-07-29 ENCOUNTER — Encounter (HOSPITAL_COMMUNITY): Payer: Self-pay | Admitting: Infectious Diseases

## 2024-07-29 ENCOUNTER — Inpatient Hospital Stay (HOSPITAL_COMMUNITY)

## 2024-07-29 DIAGNOSIS — Z9981 Dependence on supplemental oxygen: Secondary | ICD-10-CM

## 2024-07-29 DIAGNOSIS — N1831 Chronic kidney disease, stage 3a: Secondary | ICD-10-CM

## 2024-07-29 DIAGNOSIS — K2289 Other specified disease of esophagus: Secondary | ICD-10-CM

## 2024-07-29 DIAGNOSIS — Z951 Presence of aortocoronary bypass graft: Secondary | ICD-10-CM

## 2024-07-29 DIAGNOSIS — K2940 Chronic atrophic gastritis without bleeding: Secondary | ICD-10-CM

## 2024-07-29 DIAGNOSIS — I4891 Unspecified atrial fibrillation: Secondary | ICD-10-CM | POA: Diagnosis not present

## 2024-07-29 DIAGNOSIS — K279 Peptic ulcer, site unspecified, unspecified as acute or chronic, without hemorrhage or perforation: Secondary | ICD-10-CM

## 2024-07-29 DIAGNOSIS — I5020 Unspecified systolic (congestive) heart failure: Secondary | ICD-10-CM | POA: Diagnosis not present

## 2024-07-29 DIAGNOSIS — I130 Hypertensive heart and chronic kidney disease with heart failure and stage 1 through stage 4 chronic kidney disease, or unspecified chronic kidney disease: Secondary | ICD-10-CM

## 2024-07-29 DIAGNOSIS — D649 Anemia, unspecified: Secondary | ICD-10-CM | POA: Diagnosis not present

## 2024-07-29 DIAGNOSIS — Z7982 Long term (current) use of aspirin: Secondary | ICD-10-CM

## 2024-07-29 DIAGNOSIS — I2510 Atherosclerotic heart disease of native coronary artery without angina pectoris: Secondary | ICD-10-CM

## 2024-07-29 DIAGNOSIS — K921 Melena: Secondary | ICD-10-CM | POA: Diagnosis not present

## 2024-07-29 DIAGNOSIS — N183 Chronic kidney disease, stage 3 unspecified: Secondary | ICD-10-CM

## 2024-07-29 DIAGNOSIS — J449 Chronic obstructive pulmonary disease, unspecified: Secondary | ICD-10-CM

## 2024-07-29 DIAGNOSIS — K2950 Unspecified chronic gastritis without bleeding: Secondary | ICD-10-CM

## 2024-07-29 DIAGNOSIS — I5023 Acute on chronic systolic (congestive) heart failure: Secondary | ICD-10-CM

## 2024-07-29 DIAGNOSIS — K3189 Other diseases of stomach and duodenum: Secondary | ICD-10-CM

## 2024-07-29 DIAGNOSIS — Z7901 Long term (current) use of anticoagulants: Secondary | ICD-10-CM | POA: Diagnosis not present

## 2024-07-29 DIAGNOSIS — F1090 Alcohol use, unspecified, uncomplicated: Secondary | ICD-10-CM

## 2024-07-29 DIAGNOSIS — Z87891 Personal history of nicotine dependence: Secondary | ICD-10-CM

## 2024-07-29 HISTORY — PX: ESOPHAGOGASTRODUODENOSCOPY: SHX5428

## 2024-07-29 LAB — LIPID PANEL
Cholesterol: 98 mg/dL (ref 0–200)
HDL: 42 mg/dL (ref >40)
LDL Cholesterol: 40 mg/dL (ref 0–99)
Total CHOL/HDL Ratio: 2.3 ratio
Triglycerides: 82 mg/dL (ref <150)
VLDL: 16 mg/dL (ref 0–40)

## 2024-07-29 LAB — COMPREHENSIVE METABOLIC PANEL WITH GFR
ALT: 10 U/L (ref 0–44)
AST: 15 U/L (ref 15–41)
Albumin: 2.4 g/dL — ABNORMAL LOW (ref 3.5–5.0)
Alkaline Phosphatase: 109 U/L (ref 38–126)
Anion gap: 8 (ref 5–15)
BUN: 17 mg/dL (ref 8–23)
CO2: 21 mmol/L — ABNORMAL LOW (ref 22–32)
Calcium: 8.2 mg/dL — ABNORMAL LOW (ref 8.9–10.3)
Chloride: 105 mmol/L (ref 98–111)
Creatinine, Ser: 1.13 mg/dL — ABNORMAL HIGH (ref 0.44–1.00)
GFR, Estimated: 52 mL/min — ABNORMAL LOW (ref >60)
Glucose, Bld: 106 mg/dL — ABNORMAL HIGH (ref 70–99)
Potassium: 4.4 mmol/L (ref 3.5–5.1)
Sodium: 134 mmol/L — ABNORMAL LOW (ref 135–145)
Total Bilirubin: 0.8 mg/dL (ref 0.0–1.2)
Total Protein: 5.8 g/dL — ABNORMAL LOW (ref 6.5–8.1)

## 2024-07-29 LAB — CBC
HCT: 27.8 % — ABNORMAL LOW (ref 36.0–46.0)
Hemoglobin: 8.9 g/dL — ABNORMAL LOW (ref 12.0–15.0)
MCH: 35.9 pg — ABNORMAL HIGH (ref 26.0–34.0)
MCHC: 32 g/dL (ref 30.0–36.0)
MCV: 112.1 fL — ABNORMAL HIGH (ref 80.0–100.0)
Platelets: 269 K/uL (ref 150–400)
RBC: 2.48 MIL/uL — ABNORMAL LOW (ref 3.87–5.11)
RDW: 14.9 % (ref 11.5–15.5)
WBC: 4.7 K/uL (ref 4.0–10.5)
nRBC: 0 % (ref 0.0–0.2)

## 2024-07-29 LAB — MAGNESIUM: Magnesium: 1.9 mg/dL (ref 1.7–2.4)

## 2024-07-29 SURGERY — EGD (ESOPHAGOGASTRODUODENOSCOPY)
Anesthesia: Monitor Anesthesia Care

## 2024-07-29 MED ORDER — FUROSEMIDE 10 MG/ML IJ SOLN
20.0000 mg | Freq: Once | INTRAMUSCULAR | Status: DC
  Filled 2024-07-29: qty 2

## 2024-07-29 MED ORDER — PANTOPRAZOLE SODIUM 40 MG PO TBEC
40.0000 mg | DELAYED_RELEASE_TABLET | Freq: Every day | ORAL | Status: DC
  Administered 2024-07-30 – 2024-08-02 (×4): 40 mg via ORAL
  Filled 2024-07-29 (×4): qty 1

## 2024-07-29 MED ORDER — PHENYLEPHRINE HCL-NACL 20-0.9 MG/250ML-% IV SOLN
INTRAVENOUS | Status: DC | PRN
  Administered 2024-07-29: 50 ug/min via INTRAVENOUS

## 2024-07-29 MED ORDER — LIDOCAINE 2% (20 MG/ML) 5 ML SYRINGE
INTRAMUSCULAR | Status: DC | PRN
  Administered 2024-07-29: 60 mg via INTRAVENOUS

## 2024-07-29 MED ORDER — PHENYLEPHRINE HCL (PRESSORS) 10 MG/ML IV SOLN
INTRAVENOUS | Status: DC | PRN
  Administered 2024-07-29: 120 ug via INTRAVENOUS

## 2024-07-29 MED ORDER — PROPOFOL 500 MG/50ML IV EMUL
INTRAVENOUS | Status: DC | PRN
  Administered 2024-07-29: 85 ug/kg/min via INTRAVENOUS

## 2024-07-29 MED ORDER — POLYETHYLENE GLYCOL 3350 17 G PO PACK: 17.0000 g | PACK | Freq: Every day | ORAL | Status: DC | PRN

## 2024-07-29 MED ORDER — SENNOSIDES-DOCUSATE SODIUM 8.6-50 MG PO TABS
1.0000 | ORAL_TABLET | Freq: Every day | ORAL | Status: DC
  Administered 2024-07-29 – 2024-08-02 (×3): 1 via ORAL
  Filled 2024-07-29 (×5): qty 1

## 2024-07-29 MED ORDER — SODIUM CHLORIDE 0.9 % IV SOLN: INTRAVENOUS | Status: DC

## 2024-07-29 MED ORDER — PANTOPRAZOLE SODIUM 40 MG PO TBEC: 40.0000 mg | DELAYED_RELEASE_TABLET | Freq: Every day | ORAL | Status: DC

## 2024-07-29 MED ORDER — IOHEXOL 350 MG/ML SOLN
75.0000 mL | Freq: Once | INTRAVENOUS | Status: AC | PRN
  Administered 2024-07-29: 75 mL via INTRAVENOUS

## 2024-07-29 NOTE — Transfer of Care (Signed)
 Immediate Anesthesia Transfer of Care Note  Patient: Monica Zamora  Procedure(s) Performed: EGD (ESOPHAGOGASTRODUODENOSCOPY)  Patient Location: PACU  Anesthesia Type:MAC  Level of Consciousness: awake and drowsy  Airway & Oxygen Therapy: Patient Spontanous Breathing and Patient connected to face mask oxygen  Post-op Assessment: Report given to RN and Post -op Vital signs reviewed and stable  Post vital signs: Reviewed and stable  Last Vitals:  Vitals Value Taken Time  BP 81/54 07/29/24 12:42  Temp 36.2 C 07/29/24 12:40  Pulse 92 07/29/24 12:44  Resp 23 07/29/24 12:44  SpO2 100 % 07/29/24 12:44  Vitals shown include unfiled device data.  Last Pain:  Vitals:   07/29/24 1240  TempSrc: Temporal  PainSc: Asleep         Complications: No notable events documented.

## 2024-07-29 NOTE — Progress Notes (Signed)
 Heart Failure Nurse Navigator Progress Note  PCP: Baird Comer GAILS, NP PCP-Cardiologist: Pietro Admission Diagnosis: Atrial fibrillation, chest pain Admitted from: Home via [facility]  Presentation:   Monica Zamora presented with chest pain radiating to her right arm x 3 weeks, shortness of breath with exertion. Reports feelings of her heart racing, for about 2 weeks. Wears 2 L oxygen at baseline. Her lasix  was decreased in the past 2 weeks. BP 144/86, HR 102-184. BNP 1,340, Fecal Occult Blood positive. EKG with Atrial fibrillation with RVR. Cardizem drip started in ED.   CXR showed minimal bibasilar subsegmental atelectasis or scarring. GI consulted, for recent dark and tarry stools and drop in Hgb ,will need EGD when cleared from Cardiology. She has declined a Colonoscopy.   Patient was educated on the sign and symptoms of heart failure, daily weights, when to call his doctor or go to the ED. Diet/ fluid restrictions, taking all medications as prescribed and attending all medical appointments. Patient verbalized her understanding of all education. A HF TOC appointment was scheduled for 08/05/2024 @ 2 pm.   ECHO/ LVEF: 20-25%  Clinical Course:  Past Medical History:  Diagnosis Date   A-fib (HCC)    Carotid artery disease    COPD (chronic obstructive pulmonary disease) (HCC)    Coronary artery disease    Former tobacco use    Heart failure with mildly reduced ejection fraction (HFmrEF) (HCC)    Hx of CABG    Hypertension    PUD (peptic ulcer disease)      Social History   Socioeconomic History   Marital status: Divorced    Spouse name: Not on file   Number of children: 2   Years of education: Not on file   Highest education level: 11th grade  Occupational History   Occupation: Retired  Tobacco Use   Smoking status: Former    Current packs/day: 0.00    Average packs/day: 1.5 packs/day for 54.9 years (82.3 ttl pk-yrs)    Types: Cigarettes    Start date: 09/16/1967     Quit date: 08/05/2022    Years since quitting: 1.9   Smokeless tobacco: Never  Vaping Use   Vaping status: Never Used  Substance and Sexual Activity   Alcohol use: Not Currently   Drug use: Never   Sexual activity: Not on file  Other Topics Concern   Not on file  Social History Narrative   Not on file   Social Drivers of Health   Financial Resource Strain: Low Risk  (12/10/2023)   Received from [facility]   Overall Financial Resource Strain (CARDIA)    Difficulty of Paying Living Expenses: Not hard at all  Food Insecurity: No Food Insecurity (07/27/2024)   Hunger Vital Sign    Worried About Running Out of Food in the Last Year: Never true    Ran Out of Food in the Last Year: Never true  Transportation Needs: No Transportation Needs (07/27/2024)   PRAPARE - Administrator, Civil Service (Medical): No    Lack of Transportation (Non-Medical): No  Physical Activity: Unknown (12/10/2023)   Received from [facility]   Exercise Vital Sign    On average, how many days per week do you engage in moderate to strenuous exercise (like a brisk walk)?: 0 days    Minutes of Exercise per Session: Not on file  Stress: No Stress Concern Present (12/10/2023)   Received from [facility]ational [facility] - Occupational  Stress Questionnaire    Feeling of Stress : Only a little  Recent Concern: Stress - Stress Concern Present (11/06/2023)   Received from [facility] of Occupational Health - Occupational Stress Questionnaire    Feeling of Stress : Very much  Social Connections: Unknown (07/27/2024)   Social Connection and Isolation Panel    Frequency of Communication with Friends and Family: Three times a week    Frequency of Social Gatherings with Friends and Family: Three times a week    Attends Religious Services: More than 4 times per year    Active Member of Clubs or Organizations: No    Attends Banker Meetings:  Never    Marital Status: Patient declined   Education Assessment and Provision:  Detailed education and instructions provided on heart failure disease management including the following:  Signs and symptoms of Heart Failure When to call the physician Importance of daily weights Low sodium diet Fluid restriction Medication management Anticipated future follow-up appointments  Patient education given on each of the above topics.  Patient acknowledges understanding via teach back method and acceptance of all instructions.  Education Materials:  Living Better With Heart Failure Booklet, HF zone tool, & Daily Weight Tracker Tool.  Patient has scale at home: yes Patient has pill box at home: Yes    High Risk Criteria for Readmission and/or Poor Patient Outcomes: Heart failure hospital admissions (last 6 months): 0  No Show rate: 5 % Difficult social situation: No, lives her friend in a house.  Demonstrates medication adherence: Yes Primary Language: English Literacy level: Reading, writing, and comprehension,.  Barriers of Care:   Diet/ fluid restrictions Daily weights  Considerations/Referrals:   Referral made to Heart Failure Pharmacist Stewardship: yes Referral made to Heart Failure CSW/NCM TOC: NA Referral made to Heart & Vascular TOC clinic: Yes, 08/05/2024 @ 2 pm.   Items for Follow-up on DC/TOC: Continued HF education Diet/ fluid restrictions/ daily weights.    Stephane Haddock, BSN, Scientist, Clinical (histocompatibility And Immunogenetics) Only

## 2024-07-29 NOTE — Op Note (Signed)
 [facility] Patient Name: Monica Zamora Procedure Date : 07/29/2024 MRN: [id number] Attending MD: Elspeth SQUIBB. Leigh , MD, [id number] Date of Birth: 1950-10-09 CSN: [id number] Age: 73 Admit Type: Inpatient Procedure:                Upper GI endoscopy Indications:              Dark stools concerning for melena, anemia. On                            Eliquis , also taking aspirin  and NSAIDs at home. No                            prior EGD or colonoscopy. EGD to initially                            evaluate, rule out PUD. Providers:                Elspeth SQUIBB. Leigh, MD, Darleene Bare, RN,                            Haskel Chris, Technician Referring MD:              Medicines:                Monitored Anesthesia Care Complications:            No immediate complications. Estimated blood loss:                            Minimal. Estimated Blood Loss:     Estimated blood loss was minimal. Procedure:                Pre-Anesthesia Assessment:                           - Prior to the procedure, a History and Physical                            was performed, and patient medications and                            allergies were reviewed. The patient's tolerance of                            previous anesthesia was also reviewed. The risks                            and benefits of the procedure and the sedation                            options and risks were discussed with the patient.                            All questions were answered, and informed consent  was obtained. Prior Anticoagulants: The patient has                            taken Eliquis  (apixaban ), last dose was 2 days                            prior to procedure. ASA Grade Assessment: III - A                            patient with severe systemic disease. After                            reviewing the risks and benefits, the patient was                            deemed in  satisfactory condition to undergo the                            procedure.                           After obtaining informed consent, the endoscope was                            passed under direct vision. Throughout the                            procedure, the patient's blood pressure, pulse, and                            oxygen saturations were monitored continuously. The                            [id number]-[id number] ([id number]) Olympus endoscope was introduced                            through the mouth, and advanced to the third part                            of duodenum. The upper GI endoscopy was                            accomplished without difficulty. The patient                            tolerated the procedure well. Scope In: Scope Out: Findings:      Esophagogastric landmarks were identified: the Z-line was found at 42       cm, the gastroesophageal junction was found at 42 cm and the upper       extent of the gastric folds was found at 42 cm from the incisors.      There was a benign appearing small vascular bleb of the proximal       esophagus. The exam of the esophagus was otherwise normal.  Diffuse atrophic mucosa was found in the entire examined stomach.      Patchy mildly erythematous mucosa was found in the entire examined       stomach. Biopsies were taken with a cold forceps for Helicobacter pylori       testing.      The exam of the stomach was otherwise normal. No ulcers or cause for       bleeding noted.      The examined duodenum was normal. Impression:               - Esophagogastric landmarks identified.                           - Benign proximal esophageal vascular bleb.                           - Normal esophagus otherwise.                           - Gastric mucosal atrophy / with mild erythema.                            Biopsies taken to rule out H Pylori.                           - Normal examined duodenum.                           No cause for  bleeding symptoms / anemia on EGD.                            Colonoscopy would be next step however patient has                            made it very clear she does not want colonoscopy. I                            will discuss this further with the patient and her                            family. If she is not willing to do colonoscopy,                            would do CT abdomen / pelvis with contrast to                            assess for overt colonic malignancy as she has                            never had prior colonoscopy. Recommendation:           - Return patient to hospital ward for ongoing care.                           - Clear liquid diet for now - advance  diet pending                            decision whether or not she will be willing to                            pursue colonoscopy or other evaluation                           - Continue present medications.                           - Await pathology results.                           - Trend Hgb                           - GI service will follow - awaiting further                            discussion with patient / family on how aggressive                            she wishes to pursue further evaluation of her                            bleeding / anemia Procedure Code(s):        --- Professional ---                           [phone], Esophagogastroduodenoscopy, flexible,                            transoral; with biopsy, single or multiple Diagnosis Code(s):        --- Professional ---                           K31.89, Other diseases of stomach and duodenum                           K92.1, Melena (includes Hematochezia) CPT copyright 2022 American Medical Association. All rights reserved. The codes documented in this report are preliminary and upon coder review may  be revised to meet current compliance requirements. Elspeth P. Jenica Costilow, MD 07/29/2024 12:42:00 PM This report has been signed  electronically. Number of Addenda: 0

## 2024-07-29 NOTE — Progress Notes (Signed)
 Progress Note  Patient Name: Monica Zamora Date of Encounter: 07/29/2024 Primary Cardiologist: Redell Shallow, MD   Subjective   Overnight IM team with concerns for COPD vs heart failure, CXR shows no pleural effusion, patient returned to 3 L with IM guidance and improved.  Asked to see with urgency o support pre-procedural management  Patient notes no chest pain or palpitations.  Shortness of breath has improved.  She notes that without taking her medications she has been urinating significantly  Vital Signs    Vitals:   07/29/24 0500 07/29/24 0510 07/29/24 0515 07/29/24 0711  BP:  115/82  101/65  Pulse:  98  81  Resp:  (!) 41 (!) 24 18  Temp:  97.6 F (36.4 C)  (!) 97.5 F (36.4 C)  TempSrc:  Oral  Oral  SpO2:  95%  100%  Weight: 77.2 kg     Height:        Intake/Output Summary (Last 24 hours) at 07/29/2024 0824 Last data filed at 07/29/2024 0500 Gross per 24 hour  Intake 118.05 ml  Output 1650 ml  Net -1531.95 ml   Filed Weights   07/27/24 0933 07/28/24 0317 07/29/24 0500  Weight: 76.7 kg 78 kg 77.2 kg    Physical Exam   GEN: No acute distress.   Cardiac: IRIR with distant heart sounds Respiratory: Decreased breath sounds bilaterally. GI: Soft, nontender, non-distended  MS: No edema  Labs   Telemetry: AF - rates 80-low 100s   Chemistry Recent Labs  Lab 07/27/24 0930 07/27/24 1235 07/27/24 2133 07/28/24 0304 07/29/24 0226  Monica 136   < > 133* 132* 134*  K 5.2*   < > 4.3 4.6 4.4  CL 106   < > 104 103 105  CO2 14*  --  20* 20* 21*  GLUCOSE 74   < > 122* 110* 106*  BUN 28*   < > 20 22 17   CREATININE 1.17*   < > 1.07* 1.21* 1.13*  CALCIUM  8.5*  --  8.3* 8.1* 8.2*  PROT 6.5  --   --  6.1* 5.8*  ALBUMIN 3.2*  --   --  2.8* 2.4*  AST 22  --   --  15 15  ALT 10  --   --  12 10  ALKPHOS 123  --   --  111 109  BILITOT 0.9  --   --  0.7 0.8  GFRNONAA 50*  --  55* 48* 52*  ANIONGAP 16*  --  9 9 8    < > = values in this interval not  displayed.     Hematology Recent Labs  Lab 07/27/24 0930 07/27/24 1235 07/28/24 0304 07/29/24 0226  WBC 6.1  --  6.0 4.7  RBC 2.72*  --  2.55* 2.48*  HGB 9.9* 10.9* 9.1* 8.9*  HCT 31.6* 32.0* 28.8* 27.8*  MCV 116.2*  --  112.9* 112.1*  MCH 36.4*  --  35.7* 35.9*  MCHC 31.3  --  31.6 32.0  RDW 15.0  --  14.6 14.9  PLT 325  --  277 269    BNP Recent Labs  Lab 07/27/24 0930  BNP 1,340.4*     Cardiac Studies   Cardiac Studies & Procedures   ______________________________________________________________________________________________ CARDIAC CATHETERIZATION  CARDIAC CATHETERIZATION 03/17/2023  Conclusion   Prox RCA lesion is 100% stenosed.  Dist Cx lesion is 100% stenosed. -, RPDA lesion is 55% stenosed.   Prox LAD lesion is 60% stenosed.  Mid LAD lesion  is 70% stenosed.   LIMA graft was visualized by angiography.   SVG-OM3 graft was visualized by angiography and is normal in caliber.  The graft exhibits no disease.   SVG-rPDA  graft was visualized by angiography and is large.  The graft exhibits no disease.   Hemodynamic findings consistent with moderate pulmonary hypertension with Severe RV Failure.   There is no aortic valve stenosis.  POST-CATH FINDINGS Severe combined CHF with severely elevated right-sided pressures: RAP mean 24 mmHg with large mild wave.; RV P-EDP 57/8-17 mmHg. PAP-mean 6/37-42 mmHg; PCWP 35 mmHg with V wave up to 43. Significant respiratory variation. LV P-EDP 118/16-22 mmHg, AO P-MAP 106/73-79 mmHg Ao sat 96, PA sat 54%. CO-CI (Fick) 4.51-2.35; PAPI 0.86 Significant native vessel CAD with 3 of 3 patent grafts: 100% proximal RCA CTO; 100% mLCx; Prox LAD 60% & mLAD ~70% (competetive flow from LIMA-LAD) Patent LIMA-LAD, SVG-PDA, SVG-OM 3   RECOMMENDATIONS: Return patient to nursing unit for ongoing care with IV diuresis => Heart Failure Service Consulted based on Severe RV Failure She was given 80 mg IV Lasix  in the Cath Lab With the potential  for having other invasive procedures including PICC line, I have written to restart IV heparin  2 hours after sheath removal.  Would convert back to DOAC once invasive procedures are completed.  Of note, she was in A-fib RVR with rates in the 100 120 bpm range.   Alm Clay, MD  Findings Coronary Findings Diagnostic  Dominance: Right  Left Main Vessel was injected. Vessel is normal in caliber.  Left Anterior Descending Vessel is normal in caliber. Vessel is angiographically normal. Prox LAD lesion is 60% stenosed. Mid LAD lesion is 70% stenosed.  First Diagonal Branch Vessel is small in size.  First Septal Branch Vessel is small in size.  Second Diagonal Branch Vessel is small in size.  Second Septal Branch Vessel is small in size.  Left Circumflex Vessel is normal in caliber. Dist Cx lesion is 100% stenosed.  First Obtuse Marginal Branch Vessel is small in size.  Second Obtuse Marginal Branch Vessel is small in size.  Third Obtuse Marginal Branch Vessel is moderate in size.  Right Coronary Artery Vessel was injected. Vessel is moderate in size. Prox RCA lesion is 100% stenosed.  Acute Marginal Branch Vessel is small in size.  Right Ventricular Branch Vessel is small in size.  Right Posterior Descending Artery RPDA lesion is 55% stenosed.  LIMA LIMA Graft To Dist LAD LIMA graft was visualized by angiography.  -dLAD  Saphenous Graft To 3rd Mrg SVG graft was visualized by angiography and is normal in caliber.  The graft exhibits no disease. OM3  Saphenous Graft To RPDA SVG graft was visualized by angiography and is large.  The graft exhibits no disease. SVG-PDA  Intervention  No interventions have been documented.     ECHOCARDIOGRAM  ECHOCARDIOGRAM COMPLETE 07/27/2024  Narrative ECHOCARDIOGRAM REPORT    Patient Name:   Monica Zamora Date of Exam: 07/27/2024 Medical Rec #:  [id number]           Height:       63.0 in Accession #:     [id number]          Weight:       169.0 lb Date of Birth:  Dec 07, 1950           BSA:          1.800 m Patient Age:    73 years  BP:           100/63 mmHg Patient Gender: F                   HR:           120 bpm. Exam Location:  Inpatient  Procedure: 2D Echo, Cardiac Doppler, Color Doppler and Intracardiac Opacification Agent (Both Spectral and Color Flow Doppler were utilized during procedure).  Indications:    A-fib  History:        Patient has prior history of Echocardiogram examinations, most recent 02/17/2024. CAD, Prior CABG, COPD, Arrythmias:Atrial Fibrillation, Signs/Symptoms:Chest Pain; Risk Factors:Former Smoker and Hypertension.  Sonographer:    Juliene Rucks Referring Phys: [id number] MEGAN L KAMMERER  IMPRESSIONS   1. Left ventricular ejection fraction, by estimation, is 20 to 25%. The left ventricle has severely decreased function. The left ventricle demonstrates global hypokinesis. Left ventricular diastolic function could not be evaluated. Elevated left atrial pressure. 2. Right ventricular systolic function is severely reduced. The right ventricular size is normal. Tricuspid regurgitation signal is inadequate for assessing PA pressure. 3. The mitral valve is degenerative. No evidence of mitral valve regurgitation. No evidence of mitral stenosis. 4. The aortic valve is normal in structure. Aortic valve regurgitation is not visualized. No aortic stenosis is present.  Comparison(s): Changes from prior study are noted. LV function has worsened.  FINDINGS Left Ventricle: Left ventricular ejection fraction, by estimation, is 20 to 25%. The left ventricle has severely decreased function. The left ventricle demonstrates global hypokinesis. Definity contrast agent was given IV to delineate the left ventricular endocardial borders. The left ventricular internal cavity size was normal in size. There is no left ventricular hypertrophy. Left ventricular diastolic function  could not be evaluated due to atrial fibrillation. Left ventricular diastolic function could not be evaluated. Elevated left atrial pressure.  Right Ventricle: The right ventricular size is normal. No increase in right ventricular wall thickness. Right ventricular systolic function is severely reduced. Tricuspid regurgitation signal is inadequate for assessing PA pressure.  Left Atrium: Left atrial size was normal in size.  Right Atrium: Right atrial size was normal in size.  Pericardium: There is no evidence of pericardial effusion.  Mitral Valve: The mitral valve is degenerative in appearance. Mild mitral annular calcification. No evidence of mitral valve regurgitation. No evidence of mitral valve stenosis. MV peak gradient, 7.3 mmHg. The mean mitral valve gradient is 3.0 mmHg.  Tricuspid Valve: The tricuspid valve is normal in structure. Tricuspid valve regurgitation is mild . No evidence of tricuspid stenosis.  Aortic Valve: The aortic valve is normal in structure. Aortic valve regurgitation is not visualized. No aortic stenosis is present.  Pulmonic Valve: The pulmonic valve was normal in structure. Pulmonic valve regurgitation is trivial. No evidence of pulmonic stenosis.  Aorta: The aortic root is normal in size and structure.  Venous: The inferior vena cava was not well visualized.  IAS/Shunts: No atrial level shunt detected by color flow Doppler.   LEFT VENTRICLE PLAX 2D LVIDd:         5.00 cm   Diastology LVIDs:         3.80 cm   LV e' medial:  5.82 cm/s LV PW:         1.25 cm   LV e' lateral: 7.70 cm/s LV IVS:        1.10 cm LVOT diam:     2.00 cm LV SV:         44 LV SV  Index:   25 LVOT Area:     3.14 cm   LEFT ATRIUM           Index        RIGHT ATRIUM           Index LA diam:      3.80 cm 2.11 cm/m   RA Area:     18.70 cm LA Vol (A4C): 43.6 ml 24.22 ml/m  RA Volume:   53.80 ml  29.89 ml/m AORTIC VALVE LVOT Vmax:   80.80 cm/s LVOT Vmean:  50.200 cm/s LVOT  VTI:    0.141 m  AORTA Ao Root diam: 3.10 cm Ao Asc diam:  3.50 cm  MITRAL VALVE              TRICUSPID VALVE MV Area VTI:  1.59 cm    TR Peak grad:   21.9 mmHg MV Peak grad: 7.3 mmHg    TR Vmax:        234.00 cm/s MV Mean grad: 3.0 mmHg MV Vmax:      1.35 m/s    SHUNTS MV Vmean:     70.4 cm/s   Systemic VTI:  0.14 m MR Peak grad: 84.3 mmHg   Systemic Diam: 2.00 cm MR Vmax:      459.00 cm/s  Morene Brownie Electronically signed by Morene Brownie Signature Date/Time: 07/27/2024/4:22:50 PM    Final   TEE  ECHO TEE 03/20/2023  Narrative TRANSESOPHOGEAL ECHO REPORT    Patient Name:   Monica Zamora Date of Exam: 03/20/2023 Medical Rec #:  [id number]           Height:       66.0 in Accession #:    [id number]          Weight:       165.3 lb Date of Birth:  Aug 23, 1951           BSA:          1.844 m Patient Age:    72 years            BP:           108/70 mmHg Patient Gender: F                   HR:           69 bpm. Exam Location:  Inpatient  Procedure: Transesophageal Echo, Color Doppler and Cardiac Doppler  Indications:     I48.91* Unspecified atrial fibrillation  History:         Patient has prior history of Echocardiogram examinations, most recent 03/18/2023. CAD, Prior CABG, COPD, Arrythmias:Atrial Fibrillation; Risk Factors:Hypertension and Dyslipidemia.  Sonographer:     Damien Senior RDCS Referring Phys:  [phone] MANUELITA GARRE [facility] Diagnosing Phys: Ezra Kanner  PROCEDURE: After discussion of the risks and benefits of a TEE, an informed consent was obtained from the patient. The transesophogeal probe was passed without difficulty through the esophogus of the patient. Sedation performed by different physician. The patient was monitored while under deep sedation. Anesthestetic sedation was provided intravenously by Anesthesiology: 108mg  of Propofol , 80mg  of Lidocaine . The patient developed no complications during the procedure. A successful direct  current cardioversion was performed at 200 joules with 1 attempt.  IMPRESSIONS   1. Left ventricular ejection fraction, by estimation, is 30 to 35%. The left ventricle has moderately decreased function. The left ventricle demonstrates global hypokinesis. The left ventricular internal cavity size was mildly dilated. 2. Right  ventricular systolic function is moderately reduced. The right ventricular size is mildly enlarged. There is mildly elevated pulmonary artery systolic pressure. The estimated right ventricular systolic pressure is 42.0 mmHg. 3. No LA thrombus. No LA appendage was present (s/p surgical clipping). Left atrial size was mildly dilated. 4. No PFO or ASD by color doppler. 5. There was lipomatous atria septal hypertrophy. 6. Right atrial size was mildly dilated. 7. The tricuspid valve is abnormal. Tricuspid valve regurgitation is moderate to severe. 8. The mitral valve is normal in structure. Mild mitral valve regurgitation. No evidence of mitral stenosis. 9. The aortic valve is tricuspid. Aortic valve regurgitation is not visualized. No aortic stenosis is present. 10. The inferior vena cava is dilated in size with <50% respiratory variability, suggesting right atrial pressure of 15 mmHg. 11. Normal caliber thoracic aorta with grade 3 plaque in the descending thoracic aorta.  FINDINGS Left Ventricle: Left ventricular ejection fraction, by estimation, is 30 to 35%. The left ventricle has moderately decreased function. The left ventricle demonstrates global hypokinesis. The left ventricular internal cavity size was mildly dilated. There is no left ventricular hypertrophy.  Right Ventricle: The right ventricular size is mildly enlarged. No increase in right ventricular wall thickness. Right ventricular systolic function is moderately reduced. There is mildly elevated pulmonary artery systolic pressure. The tricuspid regurgitant velocity is 2.60 m/s, and with an assumed right atrial  pressure of 15 mmHg, the estimated right ventricular systolic pressure is 42.0 mmHg.  Left Atrium: No LA thrombus. No LA appendage was present (s/p surgical clipping). Left atrial size was mildly dilated. No left atrial/left atrial appendage thrombus was detected.  Right Atrium: Right atrial size was mildly dilated.  Pericardium: There is no evidence of pericardial effusion.  Mitral Valve: The mitral valve is normal in structure. Mild mitral valve regurgitation. No evidence of mitral valve stenosis.  Tricuspid Valve: The tricuspid valve is abnormal. Tricuspid valve regurgitation is moderate to severe.  Aortic Valve: The aortic valve is tricuspid. Aortic valve regurgitation is not visualized. No aortic stenosis is present.  Pulmonic Valve: The pulmonic valve was normal in structure. Pulmonic valve regurgitation is not visualized.  Aorta: The aortic root is normal in size and structure.  Venous: The inferior vena cava is dilated in size with less than 50% respiratory variability, suggesting right atrial pressure of 15 mmHg.  IAS/Shunts: No PFO or ASD by color doppler.  Additional Comments: Spectral Doppler performed.  TRICUSPID VALVE TR Peak grad:   27.0 mmHg TR Vmax:        260.00 cm/s  Dalton McleanMD Electronically signed by Ezra Kanner Signature Date/Time: 03/20/2023/6:24:37 PM    Final (Updated)        ______________________________________________________________________________________________        Assessment & Plan   Cardiac Risk optimization Clearance: Monica (illness needing inpatient procedure to support care NYHA I Persistent AF: IV amiodarone  through procedure today due to RVR and LAA ligation with residual stump on 2024 non-cardiac CT, with hyperkalemia  making digoxin less optimal; with metoprolol  12.5 mg Q6 h4 (highest dose tolerated for BP) rates are < 110; AC discussion pending results of GI procedure today CAD- s/p CABG; minimal active but with no  sx.  Would not be able to intervene from a coronary standpoint in the setting of GI bleed HFrEF- euvolemic NYHA I, LVEF severely reduced in the setting of AF RVR; stopped ARNI for BP support for rate control unable to take MRA or SGLT2i (see 07/28/24 note) BB as above  Patient is at elevated risk due to multiple co morbidities.  HR < 110, euvolemic, no angina.  Reasonable to proceed with non-elective procedure.  Will reach out to Dr. Leigh.  If needed can received IV BB therapy and levophed support, though at present evaluation rates are controlled as per guidelines  COPD as per primary    For questions or updates, please contact [facility] Please consult [email or web address] for contact info under Cardiology/STEMI.      Stanly Leavens, MD FASE [facility] Cardiologist [facility]  [address] [place], [address] [place], [place] [address] [phone]  8:24 AM

## 2024-07-29 NOTE — Telephone Encounter (Signed)
 Patient Product/process development scientist completed.    The patient is insured through [facility]. Patient has Medicare and is not eligible for a copay card, but may be able to apply for patient assistance or Medicare RX Payment Plan (Patient Must reach out to their plan, if eligible for payment plan), if available.    Ran test claim for Entresto  24-26 mg and the current 30 day co-pay is $0.00.   This test claim was processed through  [facility]- copay amounts may vary at other pharmacies due to pharmacy/plan contracts, or as the patient moves through the different stages of their insurance plan.     Reyes Sharps, CPHT Pharmacy Technician Patient Advocate Specialist Lead [facility] Patient Advocate Team Direct Number: [phone]  Fax: [phone]

## 2024-07-29 NOTE — Progress Notes (Signed)
   Heart Failure Stewardship Pharmacist Progress Note   PCP: Baird Comer GAILS, NP PCP-Cardiologist: Redell Shallow, MD    HPI:  73 yo F with PMH of afib, CHF, CAD s/p CABG, HTN, COPD, PUD, and alcohol and tobacco use.   Presented to the ED on 11/12 with R arm pain, chest pain, shortness of breath, palpitations, and lightheadedness. Found to be in afib RVR and was given diltiazem. K 5.2 - received lokelma. ECHO on 11/12 with LVEF 20-25%, global hypokinesis, RV severely reduced. Last ECHO in June with EF 50-55% but was 30-35% in July 2024 secondary to afib. Underwent cardioversion at that time.   Also noted to have recent dark and tarry stools and drop in Hgb. GI has been consulted. Will need EGD when stable form a cardiac standpoint. She declines colonoscopy.  She is not on Farxiga  or Jardiance due to recurrent UTI. She is not on spironolactone  due to hyperkalemia. She has been taking lasix  every 3-5 days at home.  Today she says her breathing has improved. More alert today. Currently on 3L O2 (2L O2 at home). Denies chest pain. No LE edema. Plan EGD today.  Current HF Medications: Diuretic: furosemide  20 mg IV x 1 Beta Blocker: metoprolol  tartrate 12.5 mg q6h  Prior to admission HF Medications: Diuretic: furosemide  20 mg every 3-5 days ACE/ARB/ARNI: Entresto  24/26 mg BID  Pertinent Lab Values: Serum creatinine 1.13, BUN 17, Potassium 4.4, Sodium 134, BNP 1340.4, Magnesium  1.9   Vital Signs: Weight: 170 lbs (admission weight: 170 lbs) Blood pressure: 110/60s  Heart rate: 80s  I/O: net -1.1L yesterday; net -2.1L since admission  Medication Assistance / Insurance Benefits Check: Does the patient have prescription insurance?  Yes Type of insurance plan: UHC Medicare + Medicaid  Outpatient Pharmacy:  Prior to admission outpatient pharmacy: [facility] - gets meds delivered Is the patient willing to use [facility] T[facility] pharmacy at discharge? Yes Is the patient willing to transition  their outpatient pharmacy to utilize a [facility] outpatient pharmacy?   No    Assessment: 1. Chronic systolic CHF (LVEF 20-25%). NYHA class I symptoms. - Agree with furosemide  20 mg IV x 1 today.  - Continue metoprolol  12.5 mg q6h - will need to transition to metoprolol  succinate prior to discharge - Holding Entresto . HR better controlled today but now BP borderline. - No spironolactone  with history of hyperkalemia - No SGLT2i with history of recurrent UTIs    Plan: 1) Medication changes recommended at this time: - Transition to metoprolol  succinate prior to discharge  2) Patient assistance: - None pending  3)  Education  - Patient has been educated on current HF medications and potential additions to HF medication regimen - Patient verbalizes understanding that over the next few months, these medication doses may change and more medications may be added to optimize HF regimen - Patient has been educated on basic disease state pathophysiology and goals of therapy   Duwaine Plant, PharmD, BCPS Heart Failure Stewardship Pharmacist Phone [phone]

## 2024-07-29 NOTE — Anesthesia Preprocedure Evaluation (Addendum)
 Anesthesia Evaluation  Patient identified by MRN, date of birth, ID band Patient awake    Reviewed: Allergy & Precautions, NPO status , Patient's Chart, lab work & pertinent test results, reviewed documented beta blocker date and time   Airway Mallampati: III  TM Distance: >3 FB     Dental  (+) Poor Dentition, Missing, Chipped, Dental Advisory Given   Pulmonary COPD,  COPD inhaler, former smoker   breath sounds clear to auscultation + decreased breath sounds      Cardiovascular hypertension, Pt. on medications and Pt. on home beta blockers + CAD, + CABG, + Peripheral Vascular Disease and +CHF  + dysrhythmias Atrial Fibrillation  Rhythm:Regular Rate:Normal  CABG x 3 1/21  EKG 07/27/24 Atrial fibrillation with rapid ventricular response Low voltage, extremity and precordial leads Nonspecific T abnormalities, lateral leads  Echo 11/12  1. Left ventricular ejection fraction, by estimation, is 20 to 25%. The  left ventricle has severely decreased function. The left ventricle  demonstrates global hypokinesis. Left ventricular diastolic function could  not be evaluated. Elevated left atrial  pressure.   2. Right ventricular systolic function is severely reduced. The right  ventricular size is normal. Tricuspid regurgitation signal is inadequate  for assessing PA pressure.   3. The mitral valve is degenerative. No evidence of mitral valve  regurgitation. No evidence of mitral stenosis.   4. The aortic valve is normal in structure. Aortic valve regurgitation is  not visualized. No aortic stenosis is present.   Cardiac Cath 03/17/23\   Prox RCA lesion is 100% stenosed.  Dist Cx lesion is 100% stenosed. -, RPDA lesion is 55% stenosed.   Prox LAD lesion is 60% stenosed.  Mid LAD lesion is 70% stenosed.   LIMA graft was visualized by angiography.   SVG-OM3 graft was visualized by angiography and is normal in caliber.  The graft  exhibits no disease.   SVG-rPDA  graft was visualized by angiography and is large.  The graft exhibits no disease.   Hemodynamic findings consistent with moderate pulmonary hypertension with Severe RV Failure.   There is no aortic valve stenosis.   POST-CATH FINDINGS  Severe combined CHF with severely elevated right-sided pressures:  RAP mean 24 mmHg with large mild wave.; RV P-EDP 57/8-17 mmHg. PAP-mean 6/37-42 mmHg; PCWP 35 mmHg with V wave up to 43. Significant respiratory variation. LV P-EDP 118/16-22 mmHg, AO P-MAP 106/73-79 mmHg  Ao sat 96, PA sat 54%. CO-CI (Fick) 4.51-2.35; PAPI 0.86  Significant native vessel CAD with 3 of 3 patent grafts:  100% proximal RCA CTO; 100% mLCx; Prox LAD 60% & mLAD ~70% (competetive flow from LIMA-LAD)  Patent LIMA-LAD, SVG-PDA, SVG-OM 3      Neuro/Psych  PSYCHIATRIC DISORDERS  Depression    negative neurological ROS     GI/Hepatic Neg liver ROS, PUD,,,Melena   Endo/Other  HLD Obesity  Renal/GU Renal InsufficiencyRenal diseaseLab Results      Component                Value               Date                      NA                       134 (L)             07/29/2024  CL                       105                 07/29/2024                K                        4.4                 07/29/2024                CO2                      21 (L)              07/29/2024                BUN                      17                  07/29/2024                CREATININE               1.13 (H)            07/29/2024                GFRNONAA                 52 (L)              07/29/2024                CALCIUM                   8.2 (L)             07/29/2024                PHOS                     3.6                 03/24/2023                ALBUMIN                  2.4 (L)             07/29/2024                GLUCOSE                  106 (H)             07/29/2024             negative genitourinary   Musculoskeletal negative  musculoskeletal ROS (+)    Abdominal  (+) + obese  Peds  Hematology  (+) Blood dyscrasia, anemia Eliquis  therapy- last dose11/10 Lab Results      Component                Value               Date  WBC                      4.7                 07/29/2024                HGB                      8.9 (L)             07/29/2024                HCT                      27.8 (L)            07/29/2024                MCV                      112.1 (H)           07/29/2024                PLT                      269                 07/29/2024              Anesthesia Other Findings   Reproductive/Obstetrics                              Anesthesia Physical Anesthesia Plan  ASA: 3  Anesthesia Plan: MAC   Post-op Pain Management: Minimal or no pain anticipated   Induction: Intravenous  PONV Risk Score and Plan: Treatment may vary due to age or medical condition and Propofol  infusion  Airway Management Planned: Natural Airway and Simple Face Mask  Additional Equipment: None  Intra-op Plan:   Post-operative Plan:   Informed Consent: I have reviewed the patients History and Physical, chart, labs and discussed the procedure including the risks, benefits and alternatives for the proposed anesthesia with the patient or authorized representative who has indicated his/her understanding and acceptance.     Dental advisory given  Plan Discussed with: CRNA and Anesthesiologist  Anesthesia Plan Comments:          Anesthesia Quick Evaluation

## 2024-07-29 NOTE — Anesthesia Postprocedure Evaluation (Signed)
 Anesthesia Post Note  Patient: Monica Zamora  Procedure(s) Performed: EGD (ESOPHAGOGASTRODUODENOSCOPY)     Patient location during evaluation: PACU Anesthesia Type: MAC Level of consciousness: awake and alert and oriented Pain management: pain level controlled Vital Signs Assessment: post-procedure vital signs reviewed and stable Respiratory status: spontaneous breathing, nonlabored ventilation and respiratory function stable Cardiovascular status: stable and blood pressure returned to baseline Postop Assessment: no apparent nausea or vomiting Anesthetic complications: no   No notable events documented.  Last Vitals:  Vitals:   07/29/24 1245 07/29/24 1250  BP: 90/63 92/63  Pulse: 92 88  Resp: (!) 23 (!) 24  Temp:    SpO2: 100% 100%    Last Pain:  Vitals:   07/29/24 1240  TempSrc: Temporal  PainSc: Asleep                 Robbyn Hodkinson A.

## 2024-07-29 NOTE — Progress Notes (Signed)
 HD#1 SUBJECTIVE:  Patient Summary: Monica Zamora is a 73 year old female with a past medical history of Atrial fibrillation, HFimpEF (50-55% from 30-35% in July 2024), s/p CABG (2021), HTN, COPD (2L baseline), PUD, and alcohol use who presented to the ED for shortness of breath.   Interim History:  11/14 There were no acute events overnight. HR maintained around 80-100. Morning labs are non-concerning. GI would like to do the EGD, Cardiology said reasonable to proceed with non-elective procedure and is coordinating with GI.   On interview, pt is willing to do EGD. Denies fever, pain, SOB, says she has been urinating a lot. Reports that she has not had a bowel movement since she was admitted.   OBJECTIVE:  Vital Signs: Vitals:   07/29/24 0500 07/29/24 0510 07/29/24 0515 07/29/24 0711  BP:  115/82  101/65  Pulse:  98  81  Resp:  (!) 41 (!) 24 18  Temp:  97.6 F (36.4 C)  (!) 97.5 F (36.4 C)  TempSrc:  Oral  Oral  SpO2:  95%  100%  Weight: 77.2 kg     Height:       Supplemental O2: Nasal Cannula 2L (on 2L [place] at home) SpO2: 100 % O2 Flow Rate (L/min): 3 L/min  Filed Weights   07/27/24 0933 07/28/24 0317 07/29/24 0500  Weight: 76.7 kg 78 kg 77.2 kg     Intake/Output Summary (Last 24 hours) at 07/29/2024 0914 Last data filed at 07/29/2024 0500 Gross per 24 hour  Intake 118.05 ml  Output 1650 ml  Net -1531.95 ml   Net IO Since Admission: -2,091.11 mL [07/29/24 0914]  Physical Exam: Physical Exam Vitals and nursing note reviewed. Exam conducted with a chaperone present.  Constitutional:      General: She is not in acute distress.    Appearance: She is not toxic-appearing.  Cardiovascular:     Rate and Rhythm: Tachycardia present.     Heart sounds: Normal heart sounds.  Pulmonary:     Effort: Pulmonary effort is normal. No respiratory distress.     Breath sounds: Normal breath sounds.     Comments: 2L [place] Musculoskeletal:     Right lower leg: No edema.      Left lower leg: No edema.  Skin:    General: Skin is warm and dry.  Neurological:     Mental Status: She is alert.     Patient Lines/Drains/Airways Status     Active Line/Drains/Airways     Name Placement date Placement time Site Days   Peripheral IV 07/27/24 20 G Left;Posterior Hand 07/27/24  0906  Hand  1            Pertinent labs and imaging:      Latest Ref Rng & Units 07/29/2024    2:26 AM 07/28/2024    3:04 AM 07/27/2024   12:35 PM  CBC  WBC 4.0 - 10.5 K/uL 4.7  6.0    Hemoglobin 12.0 - 15.0 g/dL 8.9  9.1  89.0   Hematocrit 36.0 - 46.0 % 27.8  28.8  32.0   Platelets 150 - 400 K/uL 269  277         Latest Ref Rng & Units 07/29/2024    2:26 AM 07/28/2024    3:04 AM 07/27/2024    9:33 PM  CMP  Glucose 70 - 99 mg/dL 893  889  877   BUN 8 - 23 mg/dL 17  22  20    Creatinine  0.44 - 1.00 mg/dL 8.86  8.78  8.92   Sodium 135 - 145 mmol/L 134  132  133   Potassium 3.5 - 5.1 mmol/L 4.4  4.6  4.3   Chloride 98 - 111 mmol/L 105  103  104   CO2 22 - 32 mmol/L 21  20  20    Calcium  8.9 - 10.3 mg/dL 8.2  8.1  8.3   Total Protein 6.5 - 8.1 g/dL 5.8  6.1    Total Bilirubin 0.0 - 1.2 mg/dL 0.8  0.7    Alkaline Phos 38 - 126 U/L 109  111    AST 15 - 41 U/L 15  15    ALT 0 - 44 U/L 10  12      No results found.   ASSESSMENT/PLAN:  Assessment: Principal Problem:   Atrial fibrillation with RVR (HCC) Active Problems:   COPD (chronic obstructive pulmonary disease) (HCC)   CKD stage 3a, GFR 45-59 ml/min (HCC)   HFrEF (heart failure with reduced ejection fraction) (HCC)   Hyperkalemia   Melena   Anemia   Alcohol use   Chest pain   Chronic respiratory failure (HCC)   Anticoagulated   Gastrointestinal hemorrhage  Plan: # Atrial Fibrillation with RVR #HFrEF LVEF 20-25%  # Chest Pain # CAD s/p CABG x3 2021 Patient presented to the emergency department with A-fib RVR and was started on a Cardizem drip for rate control.  On evaluation of patient's A-fib with RVR,  echocardiogram was obtained which revealed a LVEF of 20-25% which is reduced from a LVEF of 50 to 55% in June 2025.  I suspect that patient's A-fib with RVR is due to her suspected GI bleed with evidence of melena and drop in hemoglobin.  And I suspect that her recent decrease in LVEF is due to uncontrolled A-fib RVR over this past month.  Patient is euvolemic on exam and has warm exam extremities, low concern for cardiogenic shock at this time. Plan: - Cardiology consulted, appreciate recs             - Stopped diltiazem             - Continue IV Amiodarone  bolus and drip, ok for short-term use             - Coordinating with GI             - Home ASA 81mg  and Pravastatin  20mg  daily   - 20mg  Lasix  IV once    - Metoprolol  12.5mg  PO Q6h for rate control - Hold eliquis  in setting of GI bleed - Holding Spiro with Hyperkalemia    # Melena # PUD # Anemia Pt had a history of recent black tarry stools and recent Hgb drop from 11.3-->9.9. Bedside FOBT was positive. Suspect this is due to her Eliquis , NSAIDs and ASA use as well as her history of drinking hard liquor regularly.  - GI consulted, appreciate recs            - EGD today. May pursue colonoscopy later. - Protonix 40mg  IV Q12H - Holding Eliquis    # COPD # Chronic Respiratory Failure Pt has a history of COPD, is on chronic O2 at home, but not taking any inhalers. No concern for acute exacerbation at this time.  - Pulmicort nebulizers 0.25mg  BID - Ipratropium 0.5mg  nebulizer Q4H PRN   # CKD Stage 3 #Hyperkalemia Hx of CKD - Monitor Creatinine, caution with nephrotoxic medications - Hold spironolactone  - s/p lokelma   #  Alcohol Use -CIWA without ativan    Diet: CLD now, NPO after midnight VTE: SCDs Code: Full  Disposition planning: DISPO: Unclear discharge date. Pending clinical improvement.  Signature:  Penne Lera [facility]  9:14 AM, 07/29/2024  On Call pager [phone]

## 2024-07-29 NOTE — Hospital Course (Addendum)
 11/15 she's hungry and tired Her breathing isnt any worse She's going to get the colo Chest pain in a different place at the bottom of the heart? Didn't hear this compltely   She's too weak to get up on her own to poop so they're going to put in a tube  I ordered the flexiseal

## 2024-07-29 NOTE — Progress Notes (Signed)
 Progress Note   Subjective  Patient states she is feeling better. HR improved, breathing stable. Hgb at 8.9   Objective   Vital signs in last 24 hours: Temp:  [97 F (36.1 C)-98.2 F (36.8 C)] 97 F (36.1 C) (11/14 1135) Pulse Rate:  [81-119] 98 (11/14 1135) Resp:  [17-41] 17 (11/14 1135) BP: (91-138)/(58-82) 102/58 (11/14 1135) SpO2:  [95 %-100 %] 97 % (11/14 1135) Weight:  [77.2 kg] 77.2 kg (11/14 0500) Last BM Date : 07/27/24 General:    white female in NAD Neurologic:  Alert and oriented,  grossly normal neurologically. Psych:  Cooperative. Normal mood and affect.  Intake/Output from previous day: 11/13 0701 - 11/14 0700 In: 118.1 [I.V.:118.1] Out: 1650 [Urine:1650] Intake/Output this shift: No intake/output data recorded.  Lab Results: Recent Labs    07/27/24 0930 07/27/24 1235 07/28/24 0304 07/29/24 0226  WBC 6.1  --  6.0 4.7  HGB 9.9* 10.9* 9.1* 8.9*  HCT 31.6* 32.0* 28.8* 27.8*  PLT 325  --  277 269   BMET Recent Labs    07/27/24 2133 07/28/24 0304 07/29/24 0226  NA 133* 132* 134*  K 4.3 4.6 4.4  CL 104 103 105  CO2 20* 20* 21*  GLUCOSE 122* 110* 106*  BUN 20 22 17   CREATININE 1.07* 1.21* 1.13*  CALCIUM  8.3* 8.1* 8.2*   LFT Recent Labs    07/27/24 0930 07/28/24 0304 07/29/24 0226  PROT 6.5   < > 5.8*  ALBUMIN 3.2*   < > 2.4*  AST 22   < > 15  ALT 10   < > 10  ALKPHOS 123   < > 109  BILITOT 0.9   < > 0.8  BILIDIR 0.3*  --   --   IBILI 0.6  --   --    < > = values in this interval not displayed.   PT/INR No results for input(s): LABPROT, INR in the last 72 hours.  Studies/Results: ECHOCARDIOGRAM COMPLETE Result Date: 07/27/2024    ECHOCARDIOGRAM REPORT   Patient Name:   ALEXSANDRA SHONTZ Date of Exam: 07/27/2024 Medical Rec #:  [id number]           Height:       63.0 in Accession #:    [id number]          Weight:       169.0 lb Date of Birth:  10/20/1950           BSA:          1.800 m Patient Age:    73 years             BP:           100/63 mmHg Patient Gender: F                   HR:           120 bpm. Exam Location:  Inpatient Procedure: 2D Echo, Cardiac Doppler, Color Doppler and Intracardiac            Opacification Agent (Both Spectral and Color Flow Doppler were            utilized during procedure). Indications:    A-fib  History:        Patient has prior history of Echocardiogram examinations, most                 recent 02/17/2024. CAD, Prior CABG, COPD, Arrythmias:Atrial  Fibrillation, Signs/Symptoms:Chest Pain; Risk Factors:Former                 Smoker and Hypertension.  Sonographer:    Juliene Rucks Referring Phys: [id number] MEGAN L KAMMERER IMPRESSIONS  1. Left ventricular ejection fraction, by estimation, is 20 to 25%. The left ventricle has severely decreased function. The left ventricle demonstrates global hypokinesis. Left ventricular diastolic function could not be evaluated. Elevated left atrial pressure.  2. Right ventricular systolic function is severely reduced. The right ventricular size is normal. Tricuspid regurgitation signal is inadequate for assessing PA pressure.  3. The mitral valve is degenerative. No evidence of mitral valve regurgitation. No evidence of mitral stenosis.  4. The aortic valve is normal in structure. Aortic valve regurgitation is not visualized. No aortic stenosis is present. Comparison(s): Changes from prior study are noted. LV function has worsened. FINDINGS  Left Ventricle: Left ventricular ejection fraction, by estimation, is 20 to 25%. The left ventricle has severely decreased function. The left ventricle demonstrates global hypokinesis. Definity contrast agent was given IV to delineate the left ventricular endocardial borders. The left ventricular internal cavity size was normal in size. There is no left ventricular hypertrophy. Left ventricular diastolic function could not be evaluated due to atrial fibrillation. Left ventricular diastolic function could not be  evaluated. Elevated left atrial pressure. Right Ventricle: The right ventricular size is normal. No increase in right ventricular wall thickness. Right ventricular systolic function is severely reduced. Tricuspid regurgitation signal is inadequate for assessing PA pressure. Left Atrium: Left atrial size was normal in size. Right Atrium: Right atrial size was normal in size. Pericardium: There is no evidence of pericardial effusion. Mitral Valve: The mitral valve is degenerative in appearance. Mild mitral annular calcification. No evidence of mitral valve regurgitation. No evidence of mitral valve stenosis. MV peak gradient, 7.3 mmHg. The mean mitral valve gradient is 3.0 mmHg. Tricuspid Valve: The tricuspid valve is normal in structure. Tricuspid valve regurgitation is mild . No evidence of tricuspid stenosis. Aortic Valve: The aortic valve is normal in structure. Aortic valve regurgitation is not visualized. No aortic stenosis is present. Pulmonic Valve: The pulmonic valve was normal in structure. Pulmonic valve regurgitation is trivial. No evidence of pulmonic stenosis. Aorta: The aortic root is normal in size and structure. Venous: The inferior vena cava was not well visualized. IAS/Shunts: No atrial level shunt detected by color flow Doppler.  LEFT VENTRICLE PLAX 2D LVIDd:         5.00 cm   Diastology LVIDs:         3.80 cm   LV e' medial:  5.82 cm/s LV PW:         1.25 cm   LV e' lateral: 7.70 cm/s LV IVS:        1.10 cm LVOT diam:     2.00 cm LV SV:         44 LV SV Index:   25 LVOT Area:     3.14 cm  LEFT ATRIUM           Index        RIGHT ATRIUM           Index LA diam:      3.80 cm 2.11 cm/m   RA Area:     18.70 cm LA Vol (A4C): 43.6 ml 24.22 ml/m  RA Volume:   53.80 ml  29.89 ml/m  AORTIC VALVE LVOT Vmax:   80.80 cm/s LVOT Vmean:  50.200 cm/s LVOT  VTI:    0.141 m  AORTA Ao Root diam: 3.10 cm Ao Asc diam:  3.50 cm MITRAL VALVE              TRICUSPID VALVE MV Area VTI:  1.59 cm    TR Peak grad:   21.9  mmHg MV Peak grad: 7.3 mmHg    TR Vmax:        234.00 cm/s MV Mean grad: 3.0 mmHg MV Vmax:      1.35 m/s    SHUNTS MV Vmean:     70.4 cm/s   Systemic VTI:  0.14 m MR Peak grad: 84.3 mmHg   Systemic Diam: 2.00 cm MR Vmax:      459.00 cm/s Morene Brownie Electronically signed by Morene Brownie Signature Date/Time: 07/27/2024/4:22:50 PM    Final        Assessment / Plan:    73 year old female here with the following:  History of dark stools concerning for melena Anemia Anticoagulated / NSAID use A-fib with RVR COPD on supplemental oxygen   A-fib with RVR appears better controlled on beta-blocker and amiodarone .  BP stable.  She has not had any overt bleeding reportedly overnight.  Her BUN continues to downtrend from 35 on admission to 17 today.  Last dose of Eliquis  now about 2 days ago.  Recall she has been on NSAIDs and aspirin  on top of Eliquis  at home without any PPI prophylaxis.  Concern is PUD causing her dark stools and worsening anemia. We discussed EGD to further evaluate this, understanding risks and benefits of the exam.  She warrants long term anticoagulation for her medical problems and need to risk stratify her bleeding risk.  However she is HIGH risk for anesthesia currently in light of her CHF and baseline COPD.  Cardiology has seen her and feel she is safe to proceed with EGD today given her circumstances.  I discussed with procedure is and her anesthesia risks with her at length.  She understands this and wishes to proceed, answered her questions.  Hopefully we can find pathology causing this bleeding/anemia and treat if appropriate endoscopically.  If this is normal we will then need to consider colonoscopy.  Patient has made it very clear thus far she does not want a colonoscopy, hopefully we can avoid that for her if we can find something causing her anemia on the EGD.   Further recommendations pending results of EGD.  Marcey Naval, MD [facility]

## 2024-07-30 DIAGNOSIS — R933 Abnormal findings on diagnostic imaging of other parts of digestive tract: Secondary | ICD-10-CM | POA: Diagnosis not present

## 2024-07-30 DIAGNOSIS — I4891 Unspecified atrial fibrillation: Secondary | ICD-10-CM | POA: Diagnosis not present

## 2024-07-30 DIAGNOSIS — K279 Peptic ulcer, site unspecified, unspecified as acute or chronic, without hemorrhage or perforation: Secondary | ICD-10-CM | POA: Diagnosis not present

## 2024-07-30 DIAGNOSIS — R93421 Abnormal radiologic findings on diagnostic imaging of right kidney: Secondary | ICD-10-CM

## 2024-07-30 DIAGNOSIS — K921 Melena: Secondary | ICD-10-CM | POA: Diagnosis not present

## 2024-07-30 LAB — BASIC METABOLIC PANEL WITH GFR
Anion gap: 8 (ref 5–15)
BUN: 16 mg/dL (ref 8–23)
CO2: 23 mmol/L (ref 22–32)
Calcium: 7.9 mg/dL — ABNORMAL LOW (ref 8.9–10.3)
Chloride: 103 mmol/L (ref 98–111)
Creatinine, Ser: 1.26 mg/dL — ABNORMAL HIGH (ref 0.44–1.00)
GFR, Estimated: 45 mL/min — ABNORMAL LOW (ref >60)
Glucose, Bld: 105 mg/dL — ABNORMAL HIGH (ref 70–99)
Potassium: 4.4 mmol/L (ref 3.5–5.1)
Sodium: 134 mmol/L — ABNORMAL LOW (ref 135–145)

## 2024-07-30 LAB — CBC
HCT: 27.2 % — ABNORMAL LOW (ref 36.0–46.0)
Hemoglobin: 8.6 g/dL — ABNORMAL LOW (ref 12.0–15.0)
MCH: 35.7 pg — ABNORMAL HIGH (ref 26.0–34.0)
MCHC: 31.6 g/dL (ref 30.0–36.0)
MCV: 112.9 fL — ABNORMAL HIGH (ref 80.0–100.0)
Platelets: 242 K/uL (ref 150–400)
RBC: 2.41 MIL/uL — ABNORMAL LOW (ref 3.87–5.11)
RDW: 15 % (ref 11.5–15.5)
WBC: 4.9 K/uL (ref 4.0–10.5)
nRBC: 0 % (ref 0.0–0.2)

## 2024-07-30 LAB — MAGNESIUM: Magnesium: 1.9 mg/dL (ref 1.7–2.4)

## 2024-07-30 MED ORDER — HYDROXYZINE HCL 10 MG PO TABS
10.0000 mg | ORAL_TABLET | Freq: Once | ORAL | Status: AC
  Administered 2024-07-30: 10 mg via ORAL
  Filled 2024-07-30: qty 1

## 2024-07-30 MED ORDER — PEG 3350-KCL-NA BICARB-NACL 420 G PO SOLR
4000.0000 mL | Freq: Once | ORAL | Status: AC
  Administered 2024-07-30: 4000 mL via ORAL
  Filled 2024-07-30: qty 4000

## 2024-07-30 MED ORDER — ONDANSETRON 4 MG PO TBDP
4.0000 mg | ORAL_TABLET | Freq: Three times a day (TID) | ORAL | Status: DC | PRN
  Administered 2024-07-30: 4 mg via ORAL
  Filled 2024-07-30 (×2): qty 1

## 2024-07-30 MED ORDER — SODIUM CHLORIDE 0.9 % IV SOLN: INTRAVENOUS | Status: DC

## 2024-07-30 NOTE — Progress Notes (Signed)
 Subjective: No complaints.  Objective: Vital signs in last 24 hours: Temp:  [97 F (36.1 C)-98.1 F (36.7 C)] 98.1 F (36.7 C) (11/15 0800) Pulse Rate:  [75-111] 96 (11/15 0800) Resp:  [14-27] 21 (11/15 0800) BP: (84-117)/(56-82) 103/60 (11/15 0800) SpO2:  [95 %-100 %] 100 % (11/15 0410) Weight:  [75.6 kg] 75.6 kg (11/15 0414) Last BM Date : 07/27/24  Intake/Output from previous day: 11/14 0701 - 11/15 0700 In: 270 [P.O.:120; I.V.:150] Out: 700 [Urine:700] Intake/Output this shift: No intake/output data recorded.  General appearance: alert and no distress GI: soft, non-tender; bowel sounds normal; no masses,  no organomegaly  Lab Results: Recent Labs    07/28/24 0304 07/29/24 0226 07/30/24 0234  WBC 6.0 4.7 4.9  HGB 9.1* 8.9* 8.6*  HCT 28.8* 27.8* 27.2*  PLT 277 269 242   BMET Recent Labs    07/28/24 0304 07/29/24 0226 07/30/24 0234  NA 132* 134* 134*  K 4.6 4.4 4.4  CL 103 105 103  CO2 20* 21* 23  GLUCOSE 110* 106* 105*  BUN 22 17 16   CREATININE 1.21* 1.13* 1.26*  CALCIUM  8.1* 8.2* 7.9*   LFT Recent Labs    07/27/24 0930 07/28/24 0304 07/29/24 0226  PROT 6.5   < > 5.8*  ALBUMIN 3.2*   < > 2.4*  AST 22   < > 15  ALT 10   < > 10  ALKPHOS 123   < > 109  BILITOT 0.9   < > 0.8  BILIDIR 0.3*  --   --   IBILI 0.6  --   --    < > = values in this interval not displayed.   PT/INR No results for input(s): LABPROT, INR in the last 72 hours. Hepatitis Panel No results for input(s): HEPBSAG, HCVAB, HEPAIGM, HEPBIGM in the last 72 hours. C-Diff No results for input(s): CDIFFTOX in the last 72 hours. Fecal Lactopherrin No results for input(s): FECLLACTOFRN in the last 72 hours.  Studies/Results: CT ABDOMEN PELVIS W CONTRAST Result Date: 07/29/2024 EXAM: CT ABDOMEN AND PELVIS WITH CONTRAST 07/29/2024 06:44:00 PM TECHNIQUE: CT of the abdomen and pelvis was performed with the administration of 75 mL of iohexol  (OMNIPAQUE ) 350 MG/ML  injection. Multiplanar reformatted images are provided for review. Automated exposure control, iterative reconstruction, and/or weight-based adjustment of the mA/kV was utilized to reduce the radiation dose to as low as reasonably achievable. COMPARISON: 03/13/2023 CLINICAL HISTORY: Lower GI bleed; FOBT+, never had a colonoscopy, EGD negative. FINDINGS: LOWER CHEST: Extensive coronary artery calcification. Small left pleural effusion with associated left basilar compressed atelectasis. LIVER: Mild hepatic steatosis. No intrahepatic mass. No intrahepatic biliary ductal dilation. GALLBLADDER AND BILE DUCTS: Status post cholecystectomy. No biliary ductal dilatation. SPLEEN: No acute abnormality. PANCREAS: No acute abnormality. ADRENAL GLANDS: No acute abnormality. KIDNEYS, URETERS AND BLADDER: Extensive bilateral renal cortical scarring with areas of segmental atrophy which may relate to remote infection or infarction. Area of segmental nonenhancement within the upper pole of the left kidney without associated cortical atrophy most in keeping with an acute to subacute cortical infarct. Advanced vascular calcifications within the renal hila bilaterally. No stones in the kidneys or ureters. No hydronephrosis. No perinephric or periureteral stranding. No perinephric fluid collections. Urinary bladder is unremarkable. Per consensus, no follow-up is needed for simple Bosniak type 1 and 2 renal cysts, unless the patient has a malignancy history or risk factors. GI AND BOWEL: Appendix absent. Roughly 3 cm segment of luminal narrowing involving the proximal descending colon best  seen on axial image 31 / 3 and coronal image 116 / 7 which may simply represent an area of focal peristalsis, but appears asymmetric with the remainder of the colon and an underlying stricture or annular mass is difficult to exclude. The stomach, small bowel, and large bowel are otherwise unremarkable. There is no bowel obstruction. PERITONEUM AND  RETROPERITONEUM: No ascites. No free air. VASCULATURE: Aorta is normal in caliber. Extensive aortoiliac atherosclerotic calcification. Severe atherosclerotic calcification of the mesenteric, renal, and lower extremity arterial inflow vasculature. No aortic aneurysm. Peripheral vascular disease. If there is clinical evidence of hemodynamically significant renal artery stenosis, chronic mesenteric ischemia, or lower extremity claudication, CT arteriography may be more helpful for further evaluation. LYMPH NODES: No lymphadenopathy. REPRODUCTIVE ORGANS: Status post hysterectomy. No adnexal mass. BONES AND SOFT TISSUES: Osseous structures are age appropriate. No acute bone abnormality. No lytic or blastic bone lesion. Remote T11 superior endplate fracture. While this is new from prior examination, this appears remote in nature with minimal loss of height. Mild anasarca with subcutaneous fatty wall edema and presacral edema. IMPRESSION: 1. Roughly 3 cm proximal descending colon luminal narrowing, possibly focal peristalsis; however, an underlying stricture or annular mass cannot be excluded. Correlate clinically and consider colonoscopy for further evaluation. 2. Left renal upper pole segmental nonenhancement without cortical atrophy, most consistent with an acute to subacute cortical infarct; background of extensive bilateral renal cortical scarring and segmental atrophy likely from prior infection or infarction. Correlate with renal function and consider vascular evaluation if clinically indicated. 3. Peripheral vascular disease. If there is clinical evidence of hemodynamically significant renal artery stenosis, chronic mesenteric ischemia, or lower extremity claudication, CT arteriography may be more helpful for further evaluation. 4. RAF score: Aortic atherosclerosis (icd10-i70.0) Electronically signed by: Dorethia Molt MD 07/29/2024 08:54 PM EST RP Workstation: [id number]    Medications: Scheduled:  budesonide  (PULMICORT) nebulizer solution  0.25 mg Nebulization BID   furosemide   20 mg Intravenous Once   Influenza vac split trivalent PF  0.5 mL Intramuscular Tomorrow-1000   melatonin  3 mg Oral QHS   metoprolol  tartrate  12.5 mg Oral Q6H   pantoprazole  40 mg Oral Daily   polyethylene glycol-electrolytes  4,000 mL Oral Once   pravastatin   20 mg Oral q1800   senna-docusate  1 tablet Oral Daily   Continuous:  sodium chloride      amiodarone  30 mg/hr (07/30/24 0625)    Assessment/Plan: 1) Anemia. 2) Afib. 3) CAD. 4) HFrEF. 5) COPD.   The patient is willing to have the colonoscopy.  The CT scan showed a possible lesion in the descending colon, however, this maybe a false positive.  After discussing the pros and cons, she is willing to undergo the colonoscopy.  Plan: 1) Colonoscopy tomorrow. 2) Continue to optimize cardiac status. 3) Follow HGB and transfuse if necessary.  LOS: 2 days   Monica Zamora D 07/30/2024, 8:41 AM

## 2024-07-30 NOTE — Progress Notes (Signed)
 Subjective: Monica Zamora is a Monica Zamora is a 73 year old female with a past medical history of Atrial fibrillation, HFimpEF (50-55% from 30-35% in July 2024), s/p CABG (2021), HTN, COPD (2L baseline), PUD, and alcohol use who is admitted for Afib with RVR complicated by HFrEF 20-25% and a presumed GI bleed.   Overnight events: None  Today, she feels ok without any complaints. She denies changes in breathing, palpitations, or chest pain at this moment.   Objective:  Vital signs in last 24 hours: Vitals:   07/30/24 0018 07/30/24 0410 07/30/24 0414 07/30/24 0800  BP: (!) 110/56 105/69  103/60  Pulse: 97 75  96  Resp: 20 14  (!) 21  Temp: 97.6 F (36.4 C) 97.6 F (36.4 C)  98.1 F (36.7 C)  TempSrc: Oral Oral  Oral  SpO2: 97% 100%    Weight:   75.6 kg   Height:        Physical Exam: General:NAD, laying in bed  Cardiac:IRIR, mid 90s-mid 100s HR on monitor  Pulmonary:Normal effort on 3LNC, no accessory muscle usage, no wheezes auscultated  Abdominal:soft, nontender  Neuro:awake, alert, participating in conversation  MSK:No pitting edema present on BL LE Skin:warm and dry Psych: normal mood and effort       Latest Ref Rng & Units 07/30/2024    2:34 AM 07/29/2024    2:26 AM 07/28/2024    3:04 AM  CBC  WBC 4.0 - 10.5 K/uL 4.9  4.7  6.0   Hemoglobin 12.0 - 15.0 g/dL 8.6  8.9  9.1   Hematocrit 36.0 - 46.0 % 27.2  27.8  28.8   Platelets 150 - 400 K/uL 242  269  277         Latest Ref Rng & Units 07/30/2024    2:34 AM 07/29/2024    2:26 AM 07/28/2024    3:04 AM  BMP  Glucose 70 - 99 mg/dL 894  893  889   BUN 8 - 23 mg/dL 16  17  22    Creatinine 0.44 - 1.00 mg/dL 8.73  8.86  8.78   Sodium 135 - 145 mmol/L 134  134  132   Potassium 3.5 - 5.1 mmol/L 4.4  4.4  4.6   Chloride 98 - 111 mmol/L 103  105  103   CO2 22 - 32 mmol/L 23  21  20    Calcium  8.9 - 10.3 mg/dL 7.9  8.2  8.1      Assessment/Plan:  Principal Problem:   Atrial fibrillation with RVR  (HCC) Active Problems:   COPD (chronic obstructive pulmonary disease) (HCC)   CKD stage 3a, GFR 45-59 ml/min (HCC)   HFrEF (heart failure with reduced ejection fraction) (HCC)   Hyperkalemia   Melena   Anemia   Alcohol use   Chest pain   Chronic respiratory failure (HCC)   Anticoagulated   Gastrointestinal hemorrhage   Atrophic gastritis without hemorrhage  #Colon lumen narrowing on CT: #ABLA: Patient underwent EGD which did not reveal source of GI bleed. Due to patient's hesitancy with undergoing a colonoscopy, a CT scan was ordered to evaluate for signs of mass in colon/ cause of her GI bleed. CT scan revealed 3 cm proximal descending colon luminal narrowing. Hemoglobin is stable at 8.6. Patient is willing to have a colonoscopy performed.  Plan: -Colonoscopy tomorrow  -Appreciate GI's recommendations moving forward -Continue CLD today, NPO at midnight  -Bowel prep with rectal tube today   #Afib RVR:  Patient remains in afib with rate control on amiodarone  drip and lopressor  12.5 mg q6 hours. Unable to use eliquis  at this time due to evaluation of patient's GI bleed due to a presumed mass in her descending colon.  Plan: -Continue to follow along with cardiology, appreciate their recommendations   -Continue amiodarone  drip -Continue Lopressor  12.5 mg q6 hours -Continue to hold eliquis  while we evaluate cause of GI bleed  #HFrEF 20-25%: Appears euvolemic on exam today. Suspect patient's new worsening LVEF is due to uncontrolled afib with RVR for weeks prior to presentation. Plan: -Holding ARNI to allow more BP room while achieving rate control -Holding spironolactone  2/2 hyperkalemia   #COPD #Chronic Respiratory Failure Pt has a history of COPD, is on chronic O2 at home, but not taking any inhalers. No concern for acute exacerbation at this time.  - Pulmicort nebulizers 0.25mg  BID - Ipratropium 0.5mg  nebulizer Q4H PRN  #CKD Stage 3 #Hyperkalemia Hx of CKD - Monitor  Creatinine, caution with nephrotoxic medications -Hold spironolactone  -s/p lokelma  #Alcohol Use -CIWA without ativan   #Left renal upper pole segmental nonenhancement without cortical atrophy, most consistent with an acute to subacute cortical infarct Found incidentally on CT scan. Unable to anticoagulate patient with her home eliquis  at this moment. Serum creatinine remains stable at this time.   Resolved Problems:  __________________________________  Code Status: Full VTE Prophylaxis:SCDs Diet:CLD, NPO at 00 Barriers to Discharge:GI Bleed evaluation, Afib RVR  Dispo: Anticipated discharge in approximately 2-3 day(s).   Kandis Perkins, DO 07/30/2024, 9:10 AM Please contact the on call pager at: [phone]

## 2024-07-30 NOTE — Progress Notes (Signed)
 Progress Note  Patient Name: Monica Zamora Date of Encounter: 07/30/2024 Primary Cardiologist: Redell Shallow, MD   Subjective   No palpitations. Other than shortness of breath and weakness, she feels fine.  No recent dark bowel movements.  Vital Signs    Vitals:   07/30/24 0018 07/30/24 0410 07/30/24 0414 07/30/24 0800  BP: (!) 110/56 105/69  103/60  Pulse: 97 75  96  Resp: 20 14  (!) 21  Temp: 97.6 F (36.4 C) 97.6 F (36.4 C)  98.1 F (36.7 C)  TempSrc: Oral Oral  Oral  SpO2: 97% 100%    Weight:   75.6 kg   Height:        Intake/Output Summary (Last 24 hours) at 07/30/2024 0918 Last data filed at 07/30/2024 0032 Gross per 24 hour  Intake 270 ml  Output 700 ml  Net -430 ml   Filed Weights   07/28/24 0317 07/29/24 0500 07/30/24 0414  Weight: 78 kg 77.2 kg 75.6 kg    Physical Exam   GEN: No acute distress.   Cardiac: IRIR with distant heart sounds Respiratory: Decreased breath sounds bilaterally. GI: Soft, nontender, non-distended  MS: No edema  Labs   Telemetry: AF - rates 80-low 100s   Chemistry Recent Labs  Lab 07/27/24 0930 07/27/24 1235 07/28/24 0304 07/29/24 0226 07/30/24 0234  NA 136   < > 132* 134* 134*  K 5.2*   < > 4.6 4.4 4.4  CL 106   < > 103 105 103  CO2 14*   < > 20* 21* 23  GLUCOSE 74   < > 110* 106* 105*  BUN 28*   < > 22 17 16   CREATININE 1.17*   < > 1.21* 1.13* 1.26*  CALCIUM  8.5*   < > 8.1* 8.2* 7.9*  PROT 6.5  --  6.1* 5.8*  --   ALBUMIN 3.2*  --  2.8* 2.4*  --   AST 22  --  15 15  --   ALT 10  --  12 10  --   ALKPHOS 123  --  111 109  --   BILITOT 0.9  --  0.7 0.8  --   GFRNONAA 50*   < > 48* 52* 45*  ANIONGAP 16*   < > 9 8 8    < > = values in this interval not displayed.     Hematology Recent Labs  Lab 07/28/24 0304 07/29/24 0226 07/30/24 0234  WBC 6.0 4.7 4.9  RBC 2.55* 2.48* 2.41*  HGB 9.1* 8.9* 8.6*  HCT 28.8* 27.8* 27.2*  MCV 112.9* 112.1* 112.9*  MCH 35.7* 35.9* 35.7*  MCHC 31.6 32.0 31.6   RDW 14.6 14.9 15.0  PLT 277 269 242    BNP Recent Labs  Lab 07/27/24 0930  BNP 1,340.4*     Cardiac Studies   Cardiac Studies & Procedures   ______________________________________________________________________________________________ CARDIAC CATHETERIZATION  CARDIAC CATHETERIZATION 03/17/2023  Conclusion   Prox RCA lesion is 100% stenosed.  Dist Cx lesion is 100% stenosed. -, RPDA lesion is 55% stenosed.   Prox LAD lesion is 60% stenosed.  Mid LAD lesion is 70% stenosed.   LIMA graft was visualized by angiography.   SVG-OM3 graft was visualized by angiography and is normal in caliber.  The graft exhibits no disease.   SVG-rPDA  graft was visualized by angiography and is large.  The graft exhibits no disease.   Hemodynamic findings consistent with moderate pulmonary hypertension with Severe RV Failure.  There is no aortic valve stenosis.  POST-CATH FINDINGS Severe combined CHF with severely elevated right-sided pressures: RAP mean 24 mmHg with large mild wave.; RV P-EDP 57/8-17 mmHg. PAP-mean 6/37-42 mmHg; PCWP 35 mmHg with V wave up to 43. Significant respiratory variation. LV P-EDP 118/16-22 mmHg, AO P-MAP 106/73-79 mmHg Ao sat 96, PA sat 54%. CO-CI (Fick) 4.51-2.35; PAPI 0.86 Significant native vessel CAD with 3 of 3 patent grafts: 100% proximal RCA CTO; 100% mLCx; Prox LAD 60% & mLAD ~70% (competetive flow from LIMA-LAD) Patent LIMA-LAD, SVG-PDA, SVG-OM 3   RECOMMENDATIONS: Return patient to nursing unit for ongoing care with IV diuresis => Heart Failure Service Consulted based on Severe RV Failure She was given 80 mg IV Lasix  in the Cath Lab With the potential for having other invasive procedures including PICC line, I have written to restart IV heparin  2 hours after sheath removal.  Would convert back to DOAC once invasive procedures are completed.  Of note, she was in A-fib RVR with rates in the 100 120 bpm range.   Alm Clay, MD  Findings Coronary  Findings Diagnostic  Dominance: Right  Left Main Vessel was injected. Vessel is normal in caliber.  Left Anterior Descending Vessel is normal in caliber. Vessel is angiographically normal. Prox LAD lesion is 60% stenosed. Mid LAD lesion is 70% stenosed.  First Diagonal Branch Vessel is small in size.  First Septal Branch Vessel is small in size.  Second Diagonal Branch Vessel is small in size.  Second Septal Branch Vessel is small in size.  Left Circumflex Vessel is normal in caliber. Dist Cx lesion is 100% stenosed.  First Obtuse Marginal Branch Vessel is small in size.  Second Obtuse Marginal Branch Vessel is small in size.  Third Obtuse Marginal Branch Vessel is moderate in size.  Right Coronary Artery Vessel was injected. Vessel is moderate in size. Prox RCA lesion is 100% stenosed.  Acute Marginal Branch Vessel is small in size.  Right Ventricular Branch Vessel is small in size.  Right Posterior Descending Artery RPDA lesion is 55% stenosed.  LIMA LIMA Graft To Dist LAD LIMA graft was visualized by angiography.  -dLAD  Saphenous Graft To 3rd Mrg SVG graft was visualized by angiography and is normal in caliber.  The graft exhibits no disease. OM3  Saphenous Graft To RPDA SVG graft was visualized by angiography and is large.  The graft exhibits no disease. SVG-PDA  Intervention  No interventions have been documented.     ECHOCARDIOGRAM  ECHOCARDIOGRAM COMPLETE 07/27/2024  Narrative ECHOCARDIOGRAM REPORT    Patient Name:   Monica Zamora Date of Exam: 07/27/2024 Medical Rec #:  [id number]           Height:       63.0 in Accession #:    [id number]          Weight:       169.0 lb Date of Birth:  1951/07/24           BSA:          1.800 m Patient Age:    73 years            BP:           100/63 mmHg Patient Gender: F                   HR:           120 bpm. Exam Location:  Inpatient  Procedure: 2D Echo, Cardiac Doppler,  Color Doppler  and Intracardiac Opacification Agent (Both Spectral and Color Flow Doppler were utilized during procedure).  Indications:    A-fib  History:        Patient has prior history of Echocardiogram examinations, most recent 02/17/2024. CAD, Prior CABG, COPD, Arrythmias:Atrial Fibrillation, Signs/Symptoms:Chest Pain; Risk Factors:Former Smoker and Hypertension.  Sonographer:    Juliene Rucks Referring Phys: [id number] MEGAN L KAMMERER  IMPRESSIONS   1. Left ventricular ejection fraction, by estimation, is 20 to 25%. The left ventricle has severely decreased function. The left ventricle demonstrates global hypokinesis. Left ventricular diastolic function could not be evaluated. Elevated left atrial pressure. 2. Right ventricular systolic function is severely reduced. The right ventricular size is normal. Tricuspid regurgitation signal is inadequate for assessing PA pressure. 3. The mitral valve is degenerative. No evidence of mitral valve regurgitation. No evidence of mitral stenosis. 4. The aortic valve is normal in structure. Aortic valve regurgitation is not visualized. No aortic stenosis is present.  Comparison(s): Changes from prior study are noted. LV function has worsened.  FINDINGS Left Ventricle: Left ventricular ejection fraction, by estimation, is 20 to 25%. The left ventricle has severely decreased function. The left ventricle demonstrates global hypokinesis. Definity contrast agent was given IV to delineate the left ventricular endocardial borders. The left ventricular internal cavity size was normal in size. There is no left ventricular hypertrophy. Left ventricular diastolic function could not be evaluated due to atrial fibrillation. Left ventricular diastolic function could not be evaluated. Elevated left atrial pressure.  Right Ventricle: The right ventricular size is normal. No increase in right ventricular wall thickness. Right ventricular systolic function is severely reduced.  Tricuspid regurgitation signal is inadequate for assessing PA pressure.  Left Atrium: Left atrial size was normal in size.  Right Atrium: Right atrial size was normal in size.  Pericardium: There is no evidence of pericardial effusion.  Mitral Valve: The mitral valve is degenerative in appearance. Mild mitral annular calcification. No evidence of mitral valve regurgitation. No evidence of mitral valve stenosis. MV peak gradient, 7.3 mmHg. The mean mitral valve gradient is 3.0 mmHg.  Tricuspid Valve: The tricuspid valve is normal in structure. Tricuspid valve regurgitation is mild . No evidence of tricuspid stenosis.  Aortic Valve: The aortic valve is normal in structure. Aortic valve regurgitation is not visualized. No aortic stenosis is present.  Pulmonic Valve: The pulmonic valve was normal in structure. Pulmonic valve regurgitation is trivial. No evidence of pulmonic stenosis.  Aorta: The aortic root is normal in size and structure.  Venous: The inferior vena cava was not well visualized.  IAS/Shunts: No atrial level shunt detected by color flow Doppler.   LEFT VENTRICLE PLAX 2D LVIDd:         5.00 cm   Diastology LVIDs:         3.80 cm   LV e' medial:  5.82 cm/s LV PW:         1.25 cm   LV e' lateral: 7.70 cm/s LV IVS:        1.10 cm LVOT diam:     2.00 cm LV SV:         44 LV SV Index:   25 LVOT Area:     3.14 cm   LEFT ATRIUM           Index        RIGHT ATRIUM           Index LA diam:      3.80 cm  2.11 cm/m   RA Area:     18.70 cm LA Vol (A4C): 43.6 ml 24.22 ml/m  RA Volume:   53.80 ml  29.89 ml/m AORTIC VALVE LVOT Vmax:   80.80 cm/s LVOT Vmean:  50.200 cm/s LVOT VTI:    0.141 m  AORTA Ao Root diam: 3.10 cm Ao Asc diam:  3.50 cm  MITRAL VALVE              TRICUSPID VALVE MV Area VTI:  1.59 cm    TR Peak grad:   21.9 mmHg MV Peak grad: 7.3 mmHg    TR Vmax:        234.00 cm/s MV Mean grad: 3.0 mmHg MV Vmax:      1.35 m/s    SHUNTS MV Vmean:     70.4 cm/s    Systemic VTI:  0.14 m MR Peak grad: 84.3 mmHg   Systemic Diam: 2.00 cm MR Vmax:      459.00 cm/s  Morene Brownie Electronically signed by Morene Brownie Signature Date/Time: 07/27/2024/4:22:50 PM    Final   TEE  ECHO TEE 03/20/2023  Narrative TRANSESOPHOGEAL ECHO REPORT    Patient Name:   Monica Zamora Date of Exam: 03/20/2023 Medical Rec #:  [id number]           Height:       66.0 in Accession #:    [id number]          Weight:       165.3 lb Date of Birth:  12-Apr-1951           BSA:          1.844 m Patient Age:    72 years            BP:           108/70 mmHg Patient Gender: F                   HR:           69 bpm. Exam Location:  Inpatient  Procedure: Transesophageal Echo, Color Doppler and Cardiac Doppler  Indications:     I48.91* Unspecified atrial fibrillation  History:         Patient has prior history of Echocardiogram examinations, most recent 03/18/2023. CAD, Prior CABG, COPD, Arrythmias:Atrial Fibrillation; Risk Factors:Hypertension and Dyslipidemia.  Sonographer:     Damien Senior RDCS Referring Phys:  [phone] MANUELITA GARRE [facility] Diagnosing Phys: Ezra Kanner  PROCEDURE: After discussion of the risks and benefits of a TEE, an informed consent was obtained from the patient. The transesophogeal probe was passed without difficulty through the esophogus of the patient. Sedation performed by different physician. The patient was monitored while under deep sedation. Anesthestetic sedation was provided intravenously by Anesthesiology: 108mg  of Propofol , 80mg  of Lidocaine . The patient developed no complications during the procedure. A successful direct current cardioversion was performed at 200 joules with 1 attempt.  IMPRESSIONS   1. Left ventricular ejection fraction, by estimation, is 30 to 35%. The left ventricle has moderately decreased function. The left ventricle demonstrates global hypokinesis. The left ventricular internal cavity size was mildly  dilated. 2. Right ventricular systolic function is moderately reduced. The right ventricular size is mildly enlarged. There is mildly elevated pulmonary artery systolic pressure. The estimated right ventricular systolic pressure is 42.0 mmHg. 3. No LA thrombus. No LA appendage was present (s/p surgical clipping). Left atrial size was mildly dilated. 4. No PFO or ASD by color doppler.  5. There was lipomatous atria septal hypertrophy. 6. Right atrial size was mildly dilated. 7. The tricuspid valve is abnormal. Tricuspid valve regurgitation is moderate to severe. 8. The mitral valve is normal in structure. Mild mitral valve regurgitation. No evidence of mitral stenosis. 9. The aortic valve is tricuspid. Aortic valve regurgitation is not visualized. No aortic stenosis is present. 10. The inferior vena cava is dilated in size with <50% respiratory variability, suggesting right atrial pressure of 15 mmHg. 11. Normal caliber thoracic aorta with grade 3 plaque in the descending thoracic aorta.  FINDINGS Left Ventricle: Left ventricular ejection fraction, by estimation, is 30 to 35%. The left ventricle has moderately decreased function. The left ventricle demonstrates global hypokinesis. The left ventricular internal cavity size was mildly dilated. There is no left ventricular hypertrophy.  Right Ventricle: The right ventricular size is mildly enlarged. No increase in right ventricular wall thickness. Right ventricular systolic function is moderately reduced. There is mildly elevated pulmonary artery systolic pressure. The tricuspid regurgitant velocity is 2.60 m/s, and with an assumed right atrial pressure of 15 mmHg, the estimated right ventricular systolic pressure is 42.0 mmHg.  Left Atrium: No LA thrombus. No LA appendage was present (s/p surgical clipping). Left atrial size was mildly dilated. No left atrial/left atrial appendage thrombus was detected.  Right Atrium: Right atrial size was mildly  dilated.  Pericardium: There is no evidence of pericardial effusion.  Mitral Valve: The mitral valve is normal in structure. Mild mitral valve regurgitation. No evidence of mitral valve stenosis.  Tricuspid Valve: The tricuspid valve is abnormal. Tricuspid valve regurgitation is moderate to severe.  Aortic Valve: The aortic valve is tricuspid. Aortic valve regurgitation is not visualized. No aortic stenosis is present.  Pulmonic Valve: The pulmonic valve was normal in structure. Pulmonic valve regurgitation is not visualized.  Aorta: The aortic root is normal in size and structure.  Venous: The inferior vena cava is dilated in size with less than 50% respiratory variability, suggesting right atrial pressure of 15 mmHg.  IAS/Shunts: No PFO or ASD by color doppler.  Additional Comments: Spectral Doppler performed.  TRICUSPID VALVE TR Peak grad:   27.0 mmHg TR Vmax:        260.00 cm/s  Dalton McleanMD Electronically signed by Ezra Kanner Signature Date/Time: 03/20/2023/6:24:37 PM    Final (Updated)        ______________________________________________________________________________________________        Assessment & Plan   Cardiac Risk optimization Clearance: NA (illness needing inpatient procedure to support care) NYHA I  Persistent AF:  IV amiodarone  through procedure due to RVR  Prior LAA ligation with residual stump on 2024 non-cardiac CT  with hyperkalemia  making digoxin less optimal with metoprolol  12.5 mg Q6 h4 (highest dose tolerated for BP) rates are < 110 AC discussion pending results of GI procedure tomorrow  CAD:  s/p CABG; minimal active but with no sx.   Would not be able to intervene from a coronary standpoint in the setting of GI bleed  HFrEF:  euvolemic NYHA I,  LVEF severely reduced in the setting of AF RVR;  stopped ARNI for BP support for rate control -- try to restart prior to DC unable to take MRA or SGLT2i (see 07/28/24 note)  BB  as above  Patient is at elevated risk due to multiple co morbidities.  HR < 110, euvolemic, no angina.  Tolerated upper endoscopy well.  If needed can received IV BB therapy and levophed support, though at present evaluation rates  are controlled as per guidelines  COPD as per primary    For questions or updates, please contact [facility] Please consult [email or web address] for contact info under Cardiology/STEMI.      Eulas FORBES [place], MD Cardiologist [facility]  [address] [place], [address] [place], [place] [address] [phone]  9:18 AM

## 2024-07-30 NOTE — Evaluation (Signed)
 Physical Therapy Evaluation Patient Details Name: NEIDA ELLEGOOD MRN: [id number] DOB: 18-Jul-1951 Today's Date: 07/30/2024  History of Present Illness  Pt is a 73 y/o female admitted 11/12 with 2-3 weeks of worsening SOB/DOE.  Work up includes treating CHF, and afib with RVR.  PMHx:  afib, CAD, COPD, HFmrEF, HTN, PUD  Clinical Impression  Pt admitted with/for Worsening SOB and afib with RVR.  Pt has seen noticeable improvement, but is not yet at baseline, needing CG to min assist for basic mobility and gait.  Pt currently limited functionally due to the problems listed below.  (see problems list.)  Pt will benefit from PT to maximize function and safety to be able to get home safely with available assist.         If plan is discharge home, recommend the following: Assistance with cooking/housework;Assist for transportation (PRN assist)   Can travel by private vehicle        Equipment Recommendations    Recommendations for Other Services       Functional Status Assessment       Precautions / Restrictions Precautions Precautions: Fall Recall of Precautions/Restrictions: Intact      Mobility  Bed Mobility Overal bed mobility: Needs Assistance Bed Mobility: Supine to Sit, Sit to Supine     Supine to sit: Modified independent (Device/Increase time) Sit to supine: Modified independent (Device/Increase time)   General bed mobility comments: HOB up, pt CGA for transition to EOB and scooting    Transfers Overall transfer level: Needs assistance   Transfers: Sit to/from Stand, Bed to chair/wheelchair/BSC Sit to Stand: Min assist, Contact guard assist (light min contact)   Step pivot transfers: Min assist, Contact guard assist       General transfer comment: CGA mainly,  for initial mobility, pt used UE appropriately with cues as reminder.    Ambulation/Gait Ambulation/Gait assistance: Contact guard assist Gait Distance (Feet): 110 Feet Assistive device: Rolling  walker (2 wheels) Gait Pattern/deviations: Step-through pattern   Gait velocity interpretation: <1.31 ft/sec, indicative of household ambulator Pre-gait activities: light min assist while pt steadying herself and performing self-hygiene at the [facility] Gait Details: generally steady and light use of the RW.  Sats on 3L mid 90's with HR lower 100's.  Pt report starting to feel the fatigue, but generally happy with improvement  Stairs            Wheelchair Mobility     Tilt Bed    Modified Rankin (Stroke Patients Only)       Balance Overall balance assessment: Needs assistance Sitting-balance support: No upper extremity supported, Feet supported Sitting balance-Leahy Scale: Good     Standing balance support: Single extremity supported, No upper extremity supported, During functional activity Standing balance-Leahy Scale: Fair                               Pertinent Vitals/Pain Pain Assessment Pain Assessment: No/denies pain    Home Living Family/patient expects to be discharged to:: Private residence Living Arrangements: Non-relatives/Friends Available Help at Discharge: Available 24 hours/day;Available PRN/intermittently Type of Home: House Home Access: Stairs to enter   [employer] of Steps: 1   Home Layout: One level Home Equipment: Shower seat;Grab bars - toilet;Grab bars - tub/shower;Rollator (4 wheels) Additional Comments: need to ask further about bathrooms and DME    Prior Function Prior Level of Function : Independent/Modified Independent  Extremity/Trunk Assessment   Upper Extremity Assessment Upper Extremity Assessment: Overall WFL for tasks assessed    Lower Extremity Assessment Lower Extremity Assessment: Overall WFL for tasks assessed    Cervical / Trunk Assessment Cervical / Trunk Assessment: Normal  Communication   Communication Communication: No apparent difficulties     Cognition Arousal: Alert Behavior During Therapy: WFL for tasks assessed/performed   PT - Cognitive impairments: No apparent impairments                         Following commands: Intact       Cueing Cueing Techniques: Verbal cues     General Comments      Exercises     Assessment/Plan    PT Assessment    PT Problem List         PT Treatment Interventions      PT Goals (Current goals can be found in the Care Plan section)       Frequency Min 2X/week     Co-evaluation               AM-PAC PT 6 Clicks Mobility  Outcome Measure Help needed turning from your back to your side while in a flat bed without using bedrails?: A Little Help needed moving from lying on your back to sitting on the side of a flat bed without using bedrails?: A Little Help needed moving to and from a bed to a chair (including a wheelchair)?: A Little Help needed standing up from a chair using your arms (e.g., wheelchair or bedside chair)?: A Little Help needed to walk in hospital room?: A Little Help needed climbing 3-5 steps with a railing? : A Lot 6 Click Score: 17    End of Session Equipment Utilized During Treatment: Oxygen Activity Tolerance: Patient tolerated treatment well;Patient limited by fatigue Patient left: in bed;with call bell/phone within reach   PT Visit Diagnosis: Unsteadiness on feet (R26.81);Difficulty in walking, not elsewhere classified (R26.2)    Time: 1205-1239 PT Time Calculation (min) (ACUTE ONLY): 34 min   Charges:   PT Evaluation $PT Eval Moderate Complexity: 1 Mod PT Treatments $Gait Training: 8-22 mins PT General Charges $$ ACUTE PT VISIT: 1 Visit         07/30/2024  India HERO., PT Acute Rehabilitation Services [phone]  (office)  Vinie GAILS Marce Charlesworth 07/30/2024, 10:53 PM

## 2024-07-31 ENCOUNTER — Inpatient Hospital Stay (HOSPITAL_COMMUNITY): Admitting: Anesthesiology

## 2024-07-31 ENCOUNTER — Encounter (HOSPITAL_COMMUNITY)
Admission: EM | Disposition: A | Discharge status: Home Health | Payer: Self-pay | Source: Home / Self Care | Attending: Infectious Diseases

## 2024-07-31 ENCOUNTER — Encounter (HOSPITAL_COMMUNITY): Payer: Self-pay | Admitting: Infectious Diseases

## 2024-07-31 DIAGNOSIS — K633 Ulcer of intestine: Secondary | ICD-10-CM | POA: Insufficient documentation

## 2024-07-31 DIAGNOSIS — K921 Melena: Secondary | ICD-10-CM

## 2024-07-31 DIAGNOSIS — I110 Hypertensive heart disease with heart failure: Secondary | ICD-10-CM

## 2024-07-31 DIAGNOSIS — I4891 Unspecified atrial fibrillation: Secondary | ICD-10-CM | POA: Diagnosis not present

## 2024-07-31 DIAGNOSIS — I2510 Atherosclerotic heart disease of native coronary artery without angina pectoris: Secondary | ICD-10-CM

## 2024-07-31 DIAGNOSIS — Z87891 Personal history of nicotine dependence: Secondary | ICD-10-CM

## 2024-07-31 DIAGNOSIS — I5043 Acute on chronic combined systolic (congestive) and diastolic (congestive) heart failure: Secondary | ICD-10-CM

## 2024-07-31 HISTORY — PX: COLONOSCOPY: SHX5424

## 2024-07-31 LAB — CBC
HCT: 29.4 % — ABNORMAL LOW (ref 36.0–46.0)
Hemoglobin: 9.6 g/dL — ABNORMAL LOW (ref 12.0–15.0)
MCH: 36.4 pg — ABNORMAL HIGH (ref 26.0–34.0)
MCHC: 32.7 g/dL (ref 30.0–36.0)
MCV: 111.4 fL — ABNORMAL HIGH (ref 80.0–100.0)
Platelets: 259 K/uL (ref 150–400)
RBC: 2.64 MIL/uL — ABNORMAL LOW (ref 3.87–5.11)
RDW: 14.8 % (ref 11.5–15.5)
WBC: 6 K/uL (ref 4.0–10.5)
nRBC: 0 % (ref 0.0–0.2)

## 2024-07-31 LAB — BASIC METABOLIC PANEL WITH GFR
Anion gap: 9 (ref 5–15)
BUN: 14 mg/dL (ref 8–23)
CO2: 22 mmol/L (ref 22–32)
Calcium: 8.3 mg/dL — ABNORMAL LOW (ref 8.9–10.3)
Chloride: 101 mmol/L (ref 98–111)
Creatinine, Ser: 1.28 mg/dL — ABNORMAL HIGH (ref 0.44–1.00)
GFR, Estimated: 45 mL/min — ABNORMAL LOW (ref >60)
Glucose, Bld: 96 mg/dL (ref 70–99)
Potassium: 4.3 mmol/L (ref 3.5–5.1)
Sodium: 132 mmol/L — ABNORMAL LOW (ref 135–145)

## 2024-07-31 LAB — MAGNESIUM: Magnesium: 1.9 mg/dL (ref 1.7–2.4)

## 2024-07-31 SURGERY — COLONOSCOPY
Anesthesia: Monitor Anesthesia Care

## 2024-07-31 MED ORDER — AMISULPRIDE (ANTIEMETIC) 5 MG/2ML IV SOLN: 10.0000 mg | Freq: Once | INTRAVENOUS | Status: DC | PRN

## 2024-07-31 MED ORDER — ONDANSETRON HCL 4 MG/2ML IJ SOLN: 4.0000 mg | Freq: Once | INTRAMUSCULAR | Status: DC | PRN

## 2024-07-31 MED ORDER — PHENYLEPHRINE 80 MCG/ML (10ML) SYRINGE FOR IV PUSH (FOR BLOOD PRESSURE SUPPORT)
PREFILLED_SYRINGE | INTRAVENOUS | Status: DC | PRN
  Administered 2024-07-31 (×3): 160 ug via INTRAVENOUS
  Administered 2024-07-31: 120 ug via INTRAVENOUS
  Administered 2024-07-31: 160 ug via INTRAVENOUS

## 2024-07-31 MED ORDER — EPHEDRINE SULFATE-NACL 50-0.9 MG/10ML-% IV SOSY
PREFILLED_SYRINGE | INTRAVENOUS | Status: DC | PRN
  Administered 2024-07-31: 10 mg via INTRAVENOUS
  Administered 2024-07-31: 15 mg via INTRAVENOUS

## 2024-07-31 MED ORDER — PROPOFOL 500 MG/50ML IV EMUL
INTRAVENOUS | Status: DC | PRN
Start: 2024-07-31 — End: 2024-07-31
  Administered 2024-07-31: 75 ug/kg/min via INTRAVENOUS

## 2024-07-31 MED ORDER — APIXABAN 5 MG PO TABS
5.0000 mg | ORAL_TABLET | Freq: Two times a day (BID) | ORAL | Status: DC
  Administered 2024-07-31 – 2024-08-02 (×5): 5 mg via ORAL
  Filled 2024-07-31 (×5): qty 1

## 2024-07-31 MED ORDER — MAGNESIUM SULFATE 2 GM/50ML IV SOLN
2.0000 g | Freq: Once | INTRAVENOUS | Status: AC
  Administered 2024-07-31: 2 g via INTRAVENOUS
  Filled 2024-07-31: qty 50

## 2024-07-31 MED ORDER — PHENYLEPHRINE HCL-NACL 20-0.9 MG/250ML-% IV SOLN
INTRAVENOUS | Status: DC | PRN
  Administered 2024-07-31: 55 ug/min via INTRAVENOUS

## 2024-07-31 NOTE — Progress Notes (Signed)
 OT Cancellation Note  Patient Details Name: Monica Zamora MRN: [id number] DOB: 1950/12/04   Cancelled Treatment:    Reason Eval/Treat Not Completed: Patient at procedure or test/ unavailable. Pt off unit at procedure, will follow up as able to.   Keishawn Rajewski C, OT  Acute Rehabilitation Services Office [phone] Secure chat preferred   Adrianne GORMAN Savers 07/31/2024, 7:47 AM

## 2024-07-31 NOTE — Anesthesia Postprocedure Evaluation (Signed)
 Anesthesia Post Note  Patient: Monica Zamora  Procedure(s) Performed: COLONOSCOPY     Patient location during evaluation: PACU Anesthesia Type: MAC Level of consciousness: awake and alert Pain management: pain level controlled Vital Signs Assessment: post-procedure vital signs reviewed and stable Respiratory status: spontaneous breathing, nonlabored ventilation, respiratory function stable and patient connected to nasal cannula oxygen Cardiovascular status: stable and blood pressure returned to baseline Postop Assessment: no apparent nausea or vomiting Anesthetic complications: no   No notable events documented.  Last Vitals:  Vitals:   07/31/24 1000 07/31/24 1015  BP: (!) 95/55 107/70  Pulse: (!) 101 99  Resp: (!) 37 (!) 21  Temp:  36.8 C  SpO2: 96% 98%    Last Pain:  Vitals:   07/31/24 1000  TempSrc:   PainSc: 0-No pain                 Monica Zamora

## 2024-07-31 NOTE — Anesthesia Preprocedure Evaluation (Addendum)
 Anesthesia Evaluation  Patient identified by MRN, date of birth, ID band Patient awake    Reviewed: Allergy & Precautions, NPO status , Patient's Chart, lab work & pertinent test results  Airway Mallampati: I       Dental  (+) Edentulous Upper, Edentulous Lower   Pulmonary COPD, former smoker    + decreased breath sounds      Cardiovascular hypertension, + CAD, + CABG, + Peripheral Vascular Disease and +CHF  + dysrhythmias Atrial Fibrillation  Rhythm:Irregular Rate:Normal  Echo;  1. Left ventricular ejection fraction, by estimation, is 20 to 25%. The  left ventricle has severely decreased function. The left ventricle  demonstrates global hypokinesis. Left ventricular diastolic function could  not be evaluated. Elevated left atrial  pressure.   2. Right ventricular systolic function is severely reduced. The right  ventricular size is normal. Tricuspid regurgitation signal is inadequate  for assessing PA pressure.   3. The mitral valve is degenerative. No evidence of mitral valve  regurgitation. No evidence of mitral stenosis.   4. The aortic valve is normal in structure. Aortic valve regurgitation is  not visualized. No aortic stenosis is present.      Neuro/Psych  PSYCHIATRIC DISORDERS  Depression    negative neurological ROS     GI/Hepatic Neg liver ROS, PUD,,,  Endo/Other  negative endocrine ROS    Renal/GU Renal disease     Musculoskeletal negative musculoskeletal ROS (+)    Abdominal   Peds  Hematology  (+) Blood dyscrasia, anemia   Anesthesia Other Findings   Reproductive/Obstetrics                              Anesthesia Physical Anesthesia Plan  ASA: 4  Anesthesia Plan: MAC   Post-op Pain Management: Minimal or no pain anticipated   Induction: Intravenous  PONV Risk Score and Plan: 0 and Propofol  infusion  Airway Management Planned: Natural Airway and Nasal  Cannula  Additional Equipment: None  Intra-op Plan:   Post-operative Plan:   Informed Consent: I have reviewed the patients History and Physical, chart, labs and discussed the procedure including the risks, benefits and alternatives for the proposed anesthesia with the patient or authorized representative who has indicated his/her understanding and acceptance.       Plan Discussed with: CRNA  Anesthesia Plan Comments:          Anesthesia Quick Evaluation

## 2024-07-31 NOTE — Plan of Care (Addendum)
 Patient went for colonoscopy early AM, minor abnormal findings, two ulcers were found and clipped,  and 2 polyps viewed. Report said needed to be put on Neo to keep blood pressure steady. Afternoon after first normal lunch, blood pressure was up to 127/96 , metoprolol  12.5 was given and blood pressure in back into her range since she's been here at 102/82.

## 2024-07-31 NOTE — Transfer of Care (Signed)
 Immediate Anesthesia Transfer of Care Note  Patient: Monica Zamora  Procedure(s) Performed: COLONOSCOPY  Patient Location: PACU  Anesthesia Type:MAC  Level of Consciousness: drowsy  Airway & Oxygen Therapy: Patient Spontanous Breathing and Patient connected to face mask oxygen  Post-op Assessment: Report given to RN and Post -op Vital signs reviewed and stable  Post vital signs: Reviewed and stable  Last Vitals:  Vitals Value Taken Time  BP 96/55 07/31/24 08:52  Temp    Pulse 85 07/31/24 08:55  Resp 20 07/31/24 08:55  SpO2 96 % 07/31/24 08:55  Vitals shown include unfiled device data.  Last Pain:  Vitals:   07/31/24 0740  TempSrc: Temporal  PainSc: 0-No pain         Complications: No notable events documented.

## 2024-07-31 NOTE — Progress Notes (Signed)
 Progress Note  Patient Name: Monica Zamora Date of Encounter: 07/31/2024 Primary Cardiologist: Redell Shallow, MD   Subjective   No palpitations. Other than shortness of breath and weakness, she feels fine.  Colonoscopy this AM. Two snared polyps, 2 ileocecal polyps biopsied and clipped  Vital Signs    Vitals:   07/31/24 0948 07/31/24 0950 07/31/24 1000 07/31/24 1015  BP: (!) 85/48 (!) 92/57 (!) 95/55 107/70  Pulse: 96 92 (!) 101 99  Resp: (!) 24 20 (!) 37 (!) 21  Temp:    98.2 F (36.8 C)  TempSrc:      SpO2: 95% 95% 96% 98%  Weight:      Height:        Intake/Output Summary (Last 24 hours) at 07/31/2024 1142 Last data filed at 07/31/2024 0600 Gross per 24 hour  Intake 1433 ml  Output 600 ml  Net 833 ml   Filed Weights   07/28/24 0317 07/29/24 0500 07/30/24 0414  Weight: 78 kg 77.2 kg 75.6 kg    Physical Exam   GEN: No acute distress.   Cardiac: IRIR with distant heart sounds Respiratory: Decreased breath sounds bilaterally. GI: Soft, nontender, non-distended  MS: No edema  Labs   Telemetry: AF - rates 80-low 100s   Chemistry Recent Labs  Lab 07/27/24 0930 07/27/24 1235 07/28/24 0304 07/29/24 0226 07/30/24 0234 07/31/24 0312  NA 136   < > 132* 134* 134* 132*  K 5.2*   < > 4.6 4.4 4.4 4.3  CL 106   < > 103 105 103 101  CO2 14*   < > 20* 21* 23 22  GLUCOSE 74   < > 110* 106* 105* 96  BUN 28*   < > 22 17 16 14   CREATININE 1.17*   < > 1.21* 1.13* 1.26* 1.28*  CALCIUM  8.5*   < > 8.1* 8.2* 7.9* 8.3*  PROT 6.5  --  6.1* 5.8*  --   --   ALBUMIN 3.2*  --  2.8* 2.4*  --   --   AST 22  --  15 15  --   --   ALT 10  --  12 10  --   --   ALKPHOS 123  --  111 109  --   --   BILITOT 0.9  --  0.7 0.8  --   --   GFRNONAA 50*   < > 48* 52* 45* 45*  ANIONGAP 16*   < > 9 8 8 9    < > = values in this interval not displayed.     Hematology Recent Labs  Lab 07/29/24 0226 07/30/24 0234 07/31/24 0312  WBC 4.7 4.9 6.0  RBC 2.48* 2.41* 2.64*  HGB  8.9* 8.6* 9.6*  HCT 27.8* 27.2* 29.4*  MCV 112.1* 112.9* 111.4*  MCH 35.9* 35.7* 36.4*  MCHC 32.0 31.6 32.7  RDW 14.9 15.0 14.8  PLT 269 242 259    BNP Recent Labs  Lab 07/27/24 0930  BNP 1,340.4*     Cardiac Studies   Cardiac Studies & Procedures   ______________________________________________________________________________________________ CARDIAC CATHETERIZATION  CARDIAC CATHETERIZATION 03/17/2023  Conclusion   Prox RCA lesion is 100% stenosed.  Dist Cx lesion is 100% stenosed. -, RPDA lesion is 55% stenosed.   Prox LAD lesion is 60% stenosed.  Mid LAD lesion is 70% stenosed.   LIMA graft was visualized by angiography.   SVG-OM3 graft was visualized by angiography and is normal in caliber.  The graft exhibits  no disease.   SVG-rPDA  graft was visualized by angiography and is large.  The graft exhibits no disease.   Hemodynamic findings consistent with moderate pulmonary hypertension with Severe RV Failure.   There is no aortic valve stenosis.  POST-CATH FINDINGS Severe combined CHF with severely elevated right-sided pressures: RAP mean 24 mmHg with large mild wave.; RV P-EDP 57/8-17 mmHg. PAP-mean 6/37-42 mmHg; PCWP 35 mmHg with V wave up to 43. Significant respiratory variation. LV P-EDP 118/16-22 mmHg, AO P-MAP 106/73-79 mmHg Ao sat 96, PA sat 54%. CO-CI (Fick) 4.51-2.35; PAPI 0.86 Significant native vessel CAD with 3 of 3 patent grafts: 100% proximal RCA CTO; 100% mLCx; Prox LAD 60% & mLAD ~70% (competetive flow from LIMA-LAD) Patent LIMA-LAD, SVG-PDA, SVG-OM 3   RECOMMENDATIONS: Return patient to nursing unit for ongoing care with IV diuresis => Heart Failure Service Consulted based on Severe RV Failure She was given 80 mg IV Lasix  in the Cath Lab With the potential for having other invasive procedures including PICC line, I have written to restart IV heparin  2 hours after sheath removal.  Would convert back to DOAC once invasive procedures are completed.  Of  note, she was in A-fib RVR with rates in the 100 120 bpm range.   Alm Clay, MD  Findings Coronary Findings Diagnostic  Dominance: Right  Left Main Vessel was injected. Vessel is normal in caliber.  Left Anterior Descending Vessel is normal in caliber. Vessel is angiographically normal. Prox LAD lesion is 60% stenosed. Mid LAD lesion is 70% stenosed.  First Diagonal Branch Vessel is small in size.  First Septal Branch Vessel is small in size.  Second Diagonal Branch Vessel is small in size.  Second Septal Branch Vessel is small in size.  Left Circumflex Vessel is normal in caliber. Dist Cx lesion is 100% stenosed.  First Obtuse Marginal Branch Vessel is small in size.  Second Obtuse Marginal Branch Vessel is small in size.  Third Obtuse Marginal Branch Vessel is moderate in size.  Right Coronary Artery Vessel was injected. Vessel is moderate in size. Prox RCA lesion is 100% stenosed.  Acute Marginal Branch Vessel is small in size.  Right Ventricular Branch Vessel is small in size.  Right Posterior Descending Artery RPDA lesion is 55% stenosed.  LIMA LIMA Graft To Dist LAD LIMA graft was visualized by angiography.  -dLAD  Saphenous Graft To 3rd Mrg SVG graft was visualized by angiography and is normal in caliber.  The graft exhibits no disease. OM3  Saphenous Graft To RPDA SVG graft was visualized by angiography and is large.  The graft exhibits no disease. SVG-PDA  Intervention  No interventions have been documented.     ECHOCARDIOGRAM  ECHOCARDIOGRAM COMPLETE 07/27/2024  Narrative ECHOCARDIOGRAM REPORT    Patient Name:   Monica Zamora Date of Exam: 07/27/2024 Medical Rec #:  [id number]           Height:       63.0 in Accession #:    [id number]          Weight:       169.0 lb Date of Birth:  May 09, 1951           BSA:          1.800 m Patient Age:    73 years            BP:           100/63 mmHg Patient Gender: F  HR:           120 bpm. Exam Location:  Inpatient  Procedure: 2D Echo, Cardiac Doppler, Color Doppler and Intracardiac Opacification Agent (Both Spectral and Color Flow Doppler were utilized during procedure).  Indications:    A-fib  History:        Patient has prior history of Echocardiogram examinations, most recent 02/17/2024. CAD, Prior CABG, COPD, Arrythmias:Atrial Fibrillation, Signs/Symptoms:Chest Pain; Risk Factors:Former Smoker and Hypertension.  Sonographer:    Juliene Rucks Referring Phys: [id number] MEGAN L KAMMERER  IMPRESSIONS   1. Left ventricular ejection fraction, by estimation, is 20 to 25%. The left ventricle has severely decreased function. The left ventricle demonstrates global hypokinesis. Left ventricular diastolic function could not be evaluated. Elevated left atrial pressure. 2. Right ventricular systolic function is severely reduced. The right ventricular size is normal. Tricuspid regurgitation signal is inadequate for assessing PA pressure. 3. The mitral valve is degenerative. No evidence of mitral valve regurgitation. No evidence of mitral stenosis. 4. The aortic valve is normal in structure. Aortic valve regurgitation is not visualized. No aortic stenosis is present.  Comparison(s): Changes from prior study are noted. LV function has worsened.  FINDINGS Left Ventricle: Left ventricular ejection fraction, by estimation, is 20 to 25%. The left ventricle has severely decreased function. The left ventricle demonstrates global hypokinesis. Definity contrast agent was given IV to delineate the left ventricular endocardial borders. The left ventricular internal cavity size was normal in size. There is no left ventricular hypertrophy. Left ventricular diastolic function could not be evaluated due to atrial fibrillation. Left ventricular diastolic function could not be evaluated. Elevated left atrial pressure.  Right Ventricle: The right ventricular size is normal.  No increase in right ventricular wall thickness. Right ventricular systolic function is severely reduced. Tricuspid regurgitation signal is inadequate for assessing PA pressure.  Left Atrium: Left atrial size was normal in size.  Right Atrium: Right atrial size was normal in size.  Pericardium: There is no evidence of pericardial effusion.  Mitral Valve: The mitral valve is degenerative in appearance. Mild mitral annular calcification. No evidence of mitral valve regurgitation. No evidence of mitral valve stenosis. MV peak gradient, 7.3 mmHg. The mean mitral valve gradient is 3.0 mmHg.  Tricuspid Valve: The tricuspid valve is normal in structure. Tricuspid valve regurgitation is mild . No evidence of tricuspid stenosis.  Aortic Valve: The aortic valve is normal in structure. Aortic valve regurgitation is not visualized. No aortic stenosis is present.  Pulmonic Valve: The pulmonic valve was normal in structure. Pulmonic valve regurgitation is trivial. No evidence of pulmonic stenosis.  Aorta: The aortic root is normal in size and structure.  Venous: The inferior vena cava was not well visualized.  IAS/Shunts: No atrial level shunt detected by color flow Doppler.   LEFT VENTRICLE PLAX 2D LVIDd:         5.00 cm   Diastology LVIDs:         3.80 cm   LV e' medial:  5.82 cm/s LV PW:         1.25 cm   LV e' lateral: 7.70 cm/s LV IVS:        1.10 cm LVOT diam:     2.00 cm LV SV:         44 LV SV Index:   25 LVOT Area:     3.14 cm   LEFT ATRIUM           Index  RIGHT ATRIUM           Index LA diam:      3.80 cm 2.11 cm/m   RA Area:     18.70 cm LA Vol (A4C): 43.6 ml 24.22 ml/m  RA Volume:   53.80 ml  29.89 ml/m AORTIC VALVE LVOT Vmax:   80.80 cm/s LVOT Vmean:  50.200 cm/s LVOT VTI:    0.141 m  AORTA Ao Root diam: 3.10 cm Ao Asc diam:  3.50 cm  MITRAL VALVE              TRICUSPID VALVE MV Area VTI:  1.59 cm    TR Peak grad:   21.9 mmHg MV Peak grad: 7.3 mmHg    TR  Vmax:        234.00 cm/s MV Mean grad: 3.0 mmHg MV Vmax:      1.35 m/s    SHUNTS MV Vmean:     70.4 cm/s   Systemic VTI:  0.14 m MR Peak grad: 84.3 mmHg   Systemic Diam: 2.00 cm MR Vmax:      459.00 cm/s  Morene Brownie Electronically signed by Morene Brownie Signature Date/Time: 07/27/2024/4:22:50 PM    Final   TEE  ECHO TEE 03/20/2023  Narrative TRANSESOPHOGEAL ECHO REPORT    Patient Name:   [name] Date of Exam: 03/20/2023 Medical Rec #:  [id number]           Height:       66.0 in Accession #:    [id number]          Weight:       165.3 lb Date of Birth:  02-13-1951           BSA:          1.844 m Patient Age:    72 years            BP:           108/70 mmHg Patient Gender: F                   HR:           69 bpm. Exam Location:  Inpatient  Procedure: Transesophageal Echo, Color Doppler and Cardiac Doppler  Indications:     I48.91* Unspecified atrial fibrillation  History:         Patient has prior history of Echocardiogram examinations, most recent 03/18/2023. CAD, Prior CABG, COPD, Arrythmias:Atrial Fibrillation; Risk Factors:Hypertension and Dyslipidemia.  Sonographer:     Damien Senior RDCS Referring Phys:  [id number] MANUELITA GARRE [facility] Diagnosing Phys: Ezra Kanner  PROCEDURE: After discussion of the risks and benefits of a TEE, an informed consent was obtained from the patient. The transesophogeal probe was passed without difficulty through the esophogus of the patient. Sedation performed by different physician. The patient was monitored while under deep sedation. Anesthestetic sedation was provided intravenously by Anesthesiology: 108mg  of Propofol , 80mg  of Lidocaine . The patient developed no complications during the procedure. A successful direct current cardioversion was performed at 200 joules with 1 attempt.  IMPRESSIONS   1. Left ventricular ejection fraction, by estimation, is 30 to 35%. The left ventricle has moderately decreased function. The  left ventricle demonstrates global hypokinesis. The left ventricular internal cavity size was mildly dilated. 2. Right ventricular systolic function is moderately reduced. The right ventricular size is mildly enlarged. There is mildly elevated pulmonary artery systolic pressure. The estimated right ventricular systolic pressure is 42.0 mmHg. 3. No LA thrombus.  No LA appendage was present (s/p surgical clipping). Left atrial size was mildly dilated. 4. No PFO or ASD by color doppler. 5. There was lipomatous atria septal hypertrophy. 6. Right atrial size was mildly dilated. 7. The tricuspid valve is abnormal. Tricuspid valve regurgitation is moderate to severe. 8. The mitral valve is normal in structure. Mild mitral valve regurgitation. No evidence of mitral stenosis. 9. The aortic valve is tricuspid. Aortic valve regurgitation is not visualized. No aortic stenosis is present. 10. The inferior vena cava is dilated in size with <50% respiratory variability, suggesting right atrial pressure of 15 mmHg. 11. Normal caliber thoracic aorta with grade 3 plaque in the descending thoracic aorta.  FINDINGS Left Ventricle: Left ventricular ejection fraction, by estimation, is 30 to 35%. The left ventricle has moderately decreased function. The left ventricle demonstrates global hypokinesis. The left ventricular internal cavity size was mildly dilated. There is no left ventricular hypertrophy.  Right Ventricle: The right ventricular size is mildly enlarged. No increase in right ventricular wall thickness. Right ventricular systolic function is moderately reduced. There is mildly elevated pulmonary artery systolic pressure. The tricuspid regurgitant velocity is 2.60 m/s, and with an assumed right atrial pressure of 15 mmHg, the estimated right ventricular systolic pressure is 42.0 mmHg.  Left Atrium: No LA thrombus. No LA appendage was present (s/p surgical clipping). Left atrial size was mildly dilated. No left  atrial/left atrial appendage thrombus was detected.  Right Atrium: Right atrial size was mildly dilated.  Pericardium: There is no evidence of pericardial effusion.  Mitral Valve: The mitral valve is normal in structure. Mild mitral valve regurgitation. No evidence of mitral valve stenosis.  Tricuspid Valve: The tricuspid valve is abnormal. Tricuspid valve regurgitation is moderate to severe.  Aortic Valve: The aortic valve is tricuspid. Aortic valve regurgitation is not visualized. No aortic stenosis is present.  Pulmonic Valve: The pulmonic valve was normal in structure. Pulmonic valve regurgitation is not visualized.  Aorta: The aortic root is normal in size and structure.  Venous: The inferior vena cava is dilated in size with less than 50% respiratory variability, suggesting right atrial pressure of 15 mmHg.  IAS/Shunts: No PFO or ASD by color doppler.  Additional Comments: Spectral Doppler performed.  TRICUSPID VALVE TR Peak grad:   27.0 mmHg TR Vmax:        260.00 cm/s  Dalton McleanMD Electronically signed by Ezra Kanner Signature Date/Time: 03/20/2023/6:24:37 PM    Final (Updated)        ______________________________________________________________________________________________        Assessment & Plan    Persistent AF:  Prior LAA ligation with residual stump on 2024 non-cardiac CT  with hyperkalemia  making digoxin less optimal continue metoprolol  12.5 mg Q6h; titrate up as tolerated AC discussion pending results of GI input May DC amiodarone  today  CAD:  s/p CABG; minimal active but with no sx.    HFrEF:  euvolemic NYHA I,  LVEF severely reduced in the setting of AF RVR;  stopped ARNI for BP support for rate control -- try to restart prior to DC unable to take MRA or SGLT2i (see 07/28/24 note)  BB as above     COPD as per primary    For questions or updates, please contact [facility] Please consult [email or web address] for contact  info under Cardiology/STEMI.      Eulas FORBES Furbish, MD Cardiologist [facility]  [address] [place], [address] [place], [place] [address] [phone]  11:42 AM

## 2024-07-31 NOTE — Op Note (Signed)
 [facility] Patient Name: Monica Zamora Procedure Date : 07/31/2024 MRN: [id number] Attending MD: Belvie Just , MD, [id number] Date of Birth: [birth year] CSN: [id number] Age: 73 Admit Type: Inpatient Procedure:                Colonoscopy Indications:              Melena Providers:                Belvie Just, MD, Collene Edu, RN, Fairy Marina,                            Technician Referring MD:              Medicines:                Propofol  per Anesthesia Complications:            No immediate complications. Estimated Blood Loss:     Estimated blood loss: none. Procedure:                Pre-Anesthesia Assessment:                           - Prior to the procedure, a History and Physical                            was performed, and patient medications and                            allergies were reviewed. The patient's tolerance of                            previous anesthesia was also reviewed. The risks                            and benefits of the procedure and the sedation                            options and risks were discussed with the patient.                            All questions were answered, and informed consent                            was obtained. Prior Anticoagulants: The patient has                            taken no anticoagulant or antiplatelet agents. ASA                            Grade Assessment: III - A patient with severe                            systemic disease. After reviewing the risks and                            benefits, the  patient was deemed in satisfactory                            condition to undergo the procedure.                           - Sedation was administered by an anesthesia                            professional. Deep sedation was attained.                           After obtaining informed consent, the colonoscope                            was passed under direct vision. Throughout the                             procedure, the patient's blood pressure, pulse, and                            oxygen saturations were monitored continuously. The                            CF-HQ190L ([id number] Olympus colonoscope was                            introduced through the anus and advanced to the the                            cecum, identified by appendiceal orifice and                            ileocecal valve. The colonoscopy was performed                            without difficulty. The patient tolerated the                            procedure well. The quality of the bowel                            preparation was evaluated using the BBPS [facility]                            Bowel Preparation Scale) with scores of: Right                            Colon = 3 (entire mucosa seen well with no residual                            staining, small fragments of stool or opaque  liquid), Transverse Colon = 3 (entire mucosa seen                            well with no residual staining, small fragments of                            stool or opaque liquid) and Left Colon = 2 (minor                            amount of residual staining, small fragments of                            stool and/or opaque liquid, but mucosa seen well).                            The total BBPS score equals 8. The quality of the                            bowel preparation was good. The ileocecal valve,                            appendiceal orifice, and rectum were photographed. Scope In: 8:06:16 AM Scope Out: 8:35:52 AM Scope Withdrawal Time: 0 hours 22 minutes 47 seconds  Total Procedure Duration: 0 hours 29 minutes 36 seconds  Findings:      Two sessile polyps were found in the descending colon and ascending       colon. The polyps were 2 to 4 mm in size. These polyps were removed with       a cold snare. Resection and retrieval were complete.      2 ten mm ulcers were found at the  ileocecal valve. No bleeding was       present. No stigmata of recent bleeding were seen. Biopsies were taken       with a cold forceps for histology. For hemostasis, two hemostatic clips       were successfully placed (MR safe). Clip manufacturer: [employer]. There was no bleeding at the end of the procedure.      Scattered medium-mouthed diverticula were found in the sigmoid colon.      The source of of the bleeding was identified. Two ICV ulcers were noted       measuring 5-10 mm. These were benign ulcers. Cold biopsies were obtained       from the edges. The larger ulcer did exhibit a couple of hemocystic       spots. For prophylaxis, a hemoclip was placed across this site. The       initial hemoclip misfired into the cecum. Impression:               - Two 2 to 4 mm polyps in the descending colon and                            in the ascending colon, removed with a cold snare.  Resected and retrieved.                           - 2 ulcers at the ileocecal valve. Biopsied. Clip                            manufacturer: [employer]. Clips (MR safe)                            were placed.                           - Diverticulosis in the sigmoid colon. Recommendation:           - Return patient to hospital ward for ongoing care.                           - Resume regular diet.                           - Continue present medications.                           - Await pathology results.                           - Repeat colonoscopy is not recommended for                            surveillance.                           - Follow up with [facility] in 1-2 weeks. Procedure Code(s):        --- Professional ---                           [phone], 59, Colonoscopy, flexible; with control of                            bleeding, any method                           45385, Colonoscopy, flexible; with removal of                            tumor(s),  polyp(s), or other lesion(s) by snare                            technique Diagnosis Code(s):        --- Professional ---                           D12.4, Benign neoplasm of descending colon                           D12.2, Benign neoplasm of ascending colon  K63.3, Ulcer of intestine                           K92.1, Melena (includes Hematochezia)                           K57.30, Diverticulosis of large intestine without                            perforation or abscess without bleeding CPT copyright 2022 American Medical Association. All rights reserved. The codes documented in this report are preliminary and upon coder review may  be revised to meet current compliance requirements. Belvie Just, MD Belvie Just, MD 07/31/2024 8:51:11 AM This report has been signed electronically. Number of Addenda: 0

## 2024-07-31 NOTE — Evaluation (Signed)
 Occupational Therapy Evaluation Patient Details Name: Monica Zamora MRN: [id number] DOB: [birth year] Today's Date: 07/31/2024   History of Present Illness   Pt is a 73 y/o female admitted 11/12 with 2-3 weeks of worsening SOB/DOE.  Work up includes treating CHF, and afib with RVR.  PMHx:  afib, CAD, COPD, HFmrEF, HTN, PUD     Clinical Impressions PTA, pt lived with roommate and reports being independent with basic self care; sponge bathing at baseline recently due to fear of stepping over tub shower safety. Pt reports roommate provides pillbox set-up for her and brings medication to her as well as does all of the cooking so she heats up meals while he is at work. Upon eval, pt with decreased activity tolerance, cardiopulmonary endurance, strength, and balance. Needing up to min A for BADL. Recommending HHOT at discharge.      If plan is discharge home, recommend the following:   A little help with walking and/or transfers;A little help with bathing/dressing/bathroom;Assistance with cooking/housework;Assist for transportation;Help with stairs or ramp for entrance     Functional Status Assessment   Patient has had a recent decline in their functional status and demonstrates the ability to make significant improvements in function in a reasonable and predictable amount of time.     Equipment Recommendations   Tub/shower bench     Recommendations for Other Services         Precautions/Restrictions   Precautions Precautions: Fall Recall of Precautions/Restrictions: Intact     Mobility Bed Mobility Overal bed mobility: Needs Assistance Bed Mobility: Sit to Supine       Sit to supine: Min assist   General bed mobility comments: to bringh BLE into bed    Transfers Overall transfer level: Needs assistance   Transfers: Sit to/from Stand, Bed to chair/wheelchair/BSC Sit to Stand: Min assist, Contact guard assist (light min contact)     Step pivot  transfers: Contact guard assist     General transfer comment: for safety      Balance Overall balance assessment: Needs assistance Sitting-balance support: No upper extremity supported, Feet supported Sitting balance-Leahy Scale: Good     Standing balance support: Single extremity supported, No upper extremity supported, During functional activity Standing balance-Leahy Scale: Fair                             ADL either performed or assessed with clinical judgement   ADL Overall ADL's : Needs assistance/impaired Eating/Feeding: Modified independent;Sitting   Grooming: Contact guard assist;Standing   Upper Body Bathing: Set up;Sitting   Lower Body Bathing: Sit to/from stand;Contact guard assist   Upper Body Dressing : Set up;Sitting   Lower Body Dressing: Minimal assistance;Sit to/from stand   Toilet Transfer: Contact guard assist           Functional mobility during ADLs: Contact guard assist;Rolling walker (2 wheels)       Vision Patient Visual Report: No change from baseline       Perception         Praxis         Pertinent Vitals/Pain Pain Assessment Pain Assessment: No/denies pain     Extremity/Trunk Assessment Upper Extremity Assessment Upper Extremity Assessment: Overall WFL for tasks assessed   Lower Extremity Assessment Lower Extremity Assessment: Defer to PT evaluation   Cervical / Trunk Assessment Cervical / Trunk Assessment: Normal   Communication Communication Communication: No apparent difficulties   Cognition Arousal: Alert Behavior During  Therapy: WFL for tasks assessed/performed Cognition: No apparent impairments             OT - Cognition Comments: for basic functional tasks                 Following commands: Intact       Cueing  General Comments   Cueing Techniques: Verbal cues  HR up to 129 with transfers   Exercises     Shoulder Instructions      Home Living Family/patient expects  to be discharged to:: Private residence Living Arrangements: Non-relatives/Friends Available Help at Discharge: Available 24 hours/day;Available PRN/intermittently Type of Home: House Home Access: Stairs to enter [employer] of Steps: 1   Home Layout: One level     Bathroom Shower/Tub: Tub/shower unit;Sponge bathes at baseline   [employer]: Standard     Home Equipment: Shower seat;Grab bars - toilet;Grab bars - tub/shower;Rollator (4 wheels)   Additional Comments: need to ask further about bathrooms and DME      Prior Functioning/Environment Prior Level of Function : Independent/Modified Independent             Mobility Comments: rollator ADLs Comments: pt reports roommate provides all meals; she is able to heat up lunch while he is at work. roommate provides pillbox set-up, 2L o2 baseline. has been sponge bathing or bathing with wipes    OT Problem List: Decreased strength;Decreased activity tolerance;Impaired balance (sitting and/or standing);Cardiopulmonary status limiting activity   OT Treatment/Interventions: Self-care/ADL training;Therapeutic exercise;DME and/or AE instruction;Therapeutic activities;Patient/family education;Balance training      OT Goals(Current goals can be found in the care plan section)   Acute Rehab OT Goals OT Goal Formulation: With patient Time For Goal Achievement: 08/14/24 Potential to Achieve Goals: Good   OT Frequency:  Min 2X/week    Co-evaluation              AM-PAC OT 6 Clicks Daily Activity     Outcome Measure Help from another person eating meals?: None Help from another person taking care of personal grooming?: A Little Help from another person toileting, which includes using toliet, bedpan, or urinal?: A Little Help from another person bathing (including washing, rinsing, drying)?: A Little Help from another person to put on and taking off regular upper body clothing?: A Little Help from another  person to put on and taking off regular lower body clothing?: A Little 6 Click Score: 19   End of Session Equipment Utilized During Treatment: Rolling walker (2 wheels) Nurse Communication: Mobility status  Activity Tolerance: Patient tolerated treatment well Patient left: in bed;with bed alarm set;with call bell/phone within reach  OT Visit Diagnosis: Unsteadiness on feet (R26.81);Muscle weakness (generalized) (M62.81);Other (comment) (decr activity tolerance)                Time: 1640-1700 OT Time Calculation (min): 20 min Charges:  OT General Charges $OT Visit: 1 Visit OT Evaluation $OT Eval Low Complexity: 1 Low  Monica Zamora, Monica Zamora [employer] Acute Rehabilitation Office: [phone]   Monica Zamora 07/31/2024, 5:16 PM

## 2024-07-31 NOTE — Progress Notes (Signed)
 Subjective: Monica Zamora is a Monica Zamora is a 73 year old female with a past medical history of Atrial fibrillation, HFimpEF (50-55% from 30-35% in July 2024), s/p CABG (2021), HTN, COPD (2L baseline), PUD, and alcohol use who is admitted for Afib with RVR complicated by HFrEF 20-25% and a presumed GI bleed.   Interval Update 11/16 There were no acute events overnight. Vital signs have been stable, and morning labs are non-concerning with minor Magnesium  repletion and Na 132, Cr 1.28. Hgb 9.6 (from 8.6). She has been NPO for colonoscopy today after CT finding for a 3cm Proximal descending colon narrowing in the setting of GI bleed.   Pt reports doing well with the bowel prep and is intending to getting her colonoscopy today. Denies fever, SOB, chest pain, urinary concerns. Says she felt a little cold overnight.   Continuing GI bleed workup to help navigate HF and A.Fib plan. Awaiting pathology on colonoscopy biopsies.  DC: TBD  Objective:  Vital signs in last 24 hours: Vitals:   07/31/24 0342 07/31/24 0349 07/31/24 0706 07/31/24 0740  BP: 117/69 117/69 (!) 102/55 (!) 115/56  Pulse:  (!) 106 98 98  Resp:   20 (!) 21  Temp: 98.9 F (37.2 C)  98.2 F (36.8 C) (!) 97.4 F (36.3 C)  TempSrc: Oral  Oral Temporal  SpO2: 99%  98% 98%  Weight:      Height:        Physical Exam Vitals and nursing note reviewed. Exam conducted with a chaperone present.  Constitutional:      General: She is not in acute distress. Cardiovascular:     Rate and Rhythm: Normal rate and regular rhythm.     Heart sounds: No murmur heard. Pulmonary:     Effort: Pulmonary effort is normal. No respiratory distress.     Comments: On 2L [place] (baseline) Abdominal:     Palpations: Abdomen is soft.     Tenderness: There is no abdominal tenderness.  Musculoskeletal:     Right lower leg: No edema.     Left lower leg: No edema.  Skin:    General: Skin is warm and dry.  Neurological:     Mental  Status: She is alert.       Latest Ref Rng & Units 07/31/2024    3:12 AM 07/30/2024    2:34 AM 07/29/2024    2:26 AM  CBC  WBC 4.0 - 10.5 K/uL 6.0  4.9  4.7   Hemoglobin 12.0 - 15.0 g/dL 9.6  8.6  8.9   Hematocrit 36.0 - 46.0 % 29.4  27.2  27.8   Platelets 150 - 400 K/uL 259  242  269         Latest Ref Rng & Units 07/31/2024    3:12 AM 07/30/2024    2:34 AM 07/29/2024    2:26 AM  BMP  Glucose 70 - 99 mg/dL 96  894  893   BUN 8 - 23 mg/dL 14  16  17    Creatinine 0.44 - 1.00 mg/dL 8.71  8.73  8.86   Sodium 135 - 145 mmol/L 132  134  134   Potassium 3.5 - 5.1 mmol/L 4.3  4.4  4.4   Chloride 98 - 111 mmol/L 101  103  105   CO2 22 - 32 mmol/L 22  23  21    Calcium  8.9 - 10.3 mg/dL 8.3  7.9  8.2     Assessment/Plan:  Principal Problem:  Atrial fibrillation with RVR (HCC) Active Problems:   COPD (chronic obstructive pulmonary disease) (HCC)   CKD stage 3a, GFR 45-59 ml/min (HCC)   HFrEF (heart failure with reduced ejection fraction) (HCC)   Hyperkalemia   Melena   Anemia   Alcohol use   Chest pain   Chronic respiratory failure (HCC)   Anticoagulated   Gastrointestinal hemorrhage   Atrophic gastritis without hemorrhage  #Colon lumen narrowing on CT: #ABLA: Patient underwent EGD which did not reveal source of GI bleed. Due to patient's hesitancy with undergoing a colonoscopy, a CT scan was ordered to evaluate for signs of mass in colon/ cause of her GI bleed. CT scan revealed 3 cm proximal descending colon luminal narrowing. Hemoglobin increased to 9.6 (possibly hemoconcentrated). Pt went in for colonoscopy. Plan: -Colonoscopy today  -Appreciate GI's recommendations moving forward -Diet regular diet per GI  #Afib RVR: Patient remains in afib with rate control on amiodarone  drip and lopressor  12.5 mg q6 hours. Unable to use eliquis  at this time due to evaluation of patient's GI bleed due to a possible mass in her descending colon.  Plan: -Continue to follow along  with cardiology, appreciate their recommendations   -Continue amiodarone  drip -Continue Lopressor  12.5 mg q6 hours -Continue to hold eliquis  while we evaluate cause of GI bleed  #HFrEF 20-25%: Appears euvolemic on exam today. Suspect patient's new worsening LVEF is due to uncontrolled afib with RVR for weeks prior to presentation. Plan: -Holding ARNI to allow more BP room while achieving rate control -Holding spironolactone  2/2 hyperkalemia   #COPD #Chronic Respiratory Failure Pt has a history of COPD, is on chronic O2 at home, but not taking any inhalers. No concern for acute exacerbation at this time.  - Pulmicort nebulizers 0.25mg  BID - Ipratropium 0.5mg  nebulizer Q4H PRN  #CKD Stage 3 #Hyperkalemia Hx of CKD - Monitor Creatinine, caution with nephrotoxic medications - Hold spironolactone  - s/p lokelma  #Alcohol Use -CIWA without ativan   #Left renal upper pole segmental nonenhancement without cortical atrophy, most consistent with an acute to subacute cortical infarct Found incidentally on CT scan. Unable to anticoagulate patient with her home eliquis  at this moment. Serum creatinine remains stable at this time.   Resolved Problems:  __________________________________  Code Status: Full VTE Prophylaxis:SCDs Diet: Regular Barriers to Discharge: GI Bleed evaluation, Afib RVR  Dispo: Anticipated discharge in approximately 2-3 day(s).   Monica Nancyann NOVAK, DO 07/31/2024, 8:50 AM Please contact the on call pager at: [phone]

## 2024-08-01 ENCOUNTER — Encounter (HOSPITAL_COMMUNITY): Payer: Self-pay | Admitting: Gastroenterology

## 2024-08-01 DIAGNOSIS — Z860100 Personal history of colon polyps, unspecified: Secondary | ICD-10-CM

## 2024-08-01 DIAGNOSIS — K633 Ulcer of intestine: Secondary | ICD-10-CM | POA: Diagnosis not present

## 2024-08-01 DIAGNOSIS — K5792 Diverticulitis of intestine, part unspecified, without perforation or abscess without bleeding: Secondary | ICD-10-CM

## 2024-08-01 DIAGNOSIS — D649 Anemia, unspecified: Secondary | ICD-10-CM | POA: Diagnosis not present

## 2024-08-01 DIAGNOSIS — K921 Melena: Secondary | ICD-10-CM | POA: Diagnosis not present

## 2024-08-01 DIAGNOSIS — K279 Peptic ulcer, site unspecified, unspecified as acute or chronic, without hemorrhage or perforation: Secondary | ICD-10-CM | POA: Diagnosis not present

## 2024-08-01 DIAGNOSIS — I4891 Unspecified atrial fibrillation: Secondary | ICD-10-CM | POA: Diagnosis not present

## 2024-08-01 LAB — BASIC METABOLIC PANEL WITH GFR
Anion gap: 11 (ref 5–15)
Anion gap: 10 (ref 5–15)
BUN: 16 mg/dL (ref 8–23)
BUN: 16 mg/dL (ref 8–23)
CO2: 19 mmol/L — ABNORMAL LOW (ref 22–32)
CO2: 22 mmol/L (ref 22–32)
Calcium: 7.9 mg/dL — ABNORMAL LOW (ref 8.9–10.3)
Calcium: 8.2 mg/dL — ABNORMAL LOW (ref 8.9–10.3)
Chloride: 103 mmol/L (ref 98–111)
Chloride: 100 mmol/L (ref 98–111)
Creatinine, Ser: 2.05 mg/dL — ABNORMAL HIGH (ref 0.44–1.00)
Creatinine, Ser: 2.05 mg/dL — ABNORMAL HIGH (ref 0.44–1.00)
GFR, Estimated: 25 mL/min — ABNORMAL LOW (ref >60)
GFR, Estimated: 25 mL/min — ABNORMAL LOW (ref >60)
Glucose, Bld: 101 mg/dL — ABNORMAL HIGH (ref 70–99)
Glucose, Bld: 106 mg/dL — ABNORMAL HIGH (ref 70–99)
Potassium: 4 mmol/L (ref 3.5–5.1)
Potassium: 4.2 mmol/L (ref 3.5–5.1)
Sodium: 133 mmol/L — ABNORMAL LOW (ref 135–145)
Sodium: 132 mmol/L — ABNORMAL LOW (ref 135–145)

## 2024-08-01 LAB — CBC
HCT: 24.9 % — ABNORMAL LOW (ref 36.0–46.0)
Hemoglobin: 8.1 g/dL — ABNORMAL LOW (ref 12.0–15.0)
MCH: 36.5 pg — ABNORMAL HIGH (ref 26.0–34.0)
MCHC: 32.5 g/dL (ref 30.0–36.0)
MCV: 112.2 fL — ABNORMAL HIGH (ref 80.0–100.0)
Platelets: 227 K/uL (ref 150–400)
RBC: 2.22 MIL/uL — ABNORMAL LOW (ref 3.87–5.11)
RDW: 14.8 % (ref 11.5–15.5)
WBC: 5.1 K/uL (ref 4.0–10.5)
nRBC: 0 % (ref 0.0–0.2)

## 2024-08-01 LAB — HEMOGLOBIN AND HEMATOCRIT, BLOOD
HCT: 27.2 % — ABNORMAL LOW (ref 36.0–46.0)
Hemoglobin: 8.8 g/dL — ABNORMAL LOW (ref 12.0–15.0)

## 2024-08-01 LAB — MAGNESIUM: Magnesium: 2.1 mg/dL (ref 1.7–2.4)

## 2024-08-01 MED ORDER — ALBUTEROL SULFATE (2.5 MG/3ML) 0.083% IN NEBU: 2.5000 mg | INHALATION_SOLUTION | Freq: Four times a day (QID) | RESPIRATORY_TRACT | Status: DC | PRN

## 2024-08-01 MED ORDER — LACTATED RINGERS IV BOLUS
250.0000 mL | Freq: Once | INTRAVENOUS | Status: AC
  Administered 2024-08-01: 250 mL via INTRAVENOUS

## 2024-08-01 MED ORDER — IPRATROPIUM BROMIDE 0.02 % IN SOLN: 0.5000 mg | RESPIRATORY_TRACT | Status: DC | PRN

## 2024-08-01 MED ORDER — UMECLIDINIUM-VILANTEROL 62.5-25 MCG/ACT IN AEPB
1.0000 | INHALATION_SPRAY | Freq: Every day | RESPIRATORY_TRACT | Status: DC
  Filled 2024-08-01 (×2): qty 14

## 2024-08-01 NOTE — Progress Notes (Signed)
 SATURATION QUALIFICATIONS: (This note is used to comply with regulatory documentation for home oxygen)  Patient Saturations on Room Air at Rest = 92%  Patient Saturations on Room Air while Ambulating = 88%  Patient Saturations on 1 Liters of oxygen while Ambulating = 92%  Please briefly explain why patient needs home oxygen:Pt requiring 1L with activity to maintain SPO2 >90%  Surie Suchocki P, PT Acute Rehabilitation Services Office: [phone]

## 2024-08-01 NOTE — Plan of Care (Signed)

## 2024-08-01 NOTE — Progress Notes (Signed)
 Mobility Specialist Progress Note:    08/01/24 1308  Mobility  Activity Turned to back - supine (Ankle Pumps, Leg Lifts, Heel Slides)  Level of Assistance Standby assist, set-up cues, supervision of patient - no hands on  Assistive Device None  Range of Motion/Exercises Left leg;Right leg;Active  Activity Response Tolerated well  Mobility Referral Yes  Mobility visit 1 Mobility  Mobility Specialist Start Time (ACUTE ONLY) 1308  Mobility Specialist Stop Time (ACUTE ONLY) 1314  Mobility Specialist Time Calculation (min) (ACUTE ONLY) 6 min   Received pt laying in bed not agreeable to ambulation since they worked w/ therapy this morning, but agreeable to bed mobility. No c/o any symptoms. Pt moving and able to perform exercises well. Left pt in bed w/ all needs met.   Venetia Keel Mobility Specialist Please Neurosurgeon or Rehab Office at [phone]

## 2024-08-01 NOTE — Progress Notes (Signed)
 Progress Note  Patient Name: Monica Zamora Date of Encounter: 08/01/2024  Primary Cardiologist: Redell Shallow, MD   Subjective   BP has remained soft/low since her colonoscopy and restarting Eliquis . Hb dropped. She denies any bleeding, pain, shortness of breath or any other anemic symptoms and is hoping to go home soon.   Inpatient Medications    Scheduled Meds:  apixaban   5 mg Oral BID   budesonide (PULMICORT) nebulizer solution  0.25 mg Nebulization BID   furosemide   20 mg Intravenous Once   Influenza vac split trivalent PF  0.5 mL Intramuscular Tomorrow-1000   melatonin  3 mg Oral QHS   metoprolol  tartrate  12.5 mg Oral Q6H   pantoprazole  40 mg Oral Daily   pravastatin   20 mg Oral q1800   senna-docusate  1 tablet Oral Daily   Continuous Infusions:  amiodarone  30 mg/hr (08/01/24 0757)   PRN Meds: acetaminophen , ipratropium, lidocaine , ondansetron , polyethylene glycol   Vital Signs    Vitals:   08/01/24 0732 08/01/24 0809 08/01/24 0837 08/01/24 0840  BP: 106/75  (!) 80/57   Pulse: 98  95 97  Resp: 20  20   Temp: 98.1 F (36.7 C)  98.1 F (36.7 C) 98.1 F (36.7 C)  TempSrc: Oral  Oral Oral  SpO2: 98% 98% 98%   Weight:      Height:        Intake/Output Summary (Last 24 hours) at 08/01/2024 0845 Last data filed at 08/01/2024 9166 Gross per 24 hour  Intake 733.37 ml  Output 1000 ml  Net -266.63 ml   Filed Weights   07/29/24 0500 07/30/24 0414 08/01/24 0600  Weight: 77.2 kg 75.6 kg 80.4 kg    Telemetry    Atrial fibrillation with occasional RVR - Personally Reviewed  ECG    AF with RVR - Personally Reviewed  Physical Exam   Physical Exam Vitals and nursing note reviewed.  Constitutional:      General: She is not in acute distress.    Appearance: Normal appearance. She is not toxic-appearing.  HENT:     Head: Normocephalic and atraumatic.  Eyes:     Conjunctiva/sclera: Conjunctivae normal.  Cardiovascular:     Rate and Rhythm:  Normal rate. Rhythm irregular.  Pulmonary:     Effort: Pulmonary effort is normal.     Breath sounds: Normal breath sounds.  Abdominal:     General: There is no distension.     Palpations: Abdomen is soft.     Tenderness: There is no abdominal tenderness. There is no guarding.  Musculoskeletal:        General: No swelling or tenderness.  Skin:    Coloration: Skin is not jaundiced or pale.  Neurological:     Mental Status: She is alert.      Labs    Chemistry Recent Labs  Lab 07/27/24 0930 07/27/24 1235 07/28/24 0304 07/29/24 0226 07/30/24 0234 07/31/24 0312 08/01/24 0327  NA 136   < > 132* 134* 134* 132* 133*  K 5.2*   < > 4.6 4.4 4.4 4.3 4.0  CL 106   < > 103 105 103 101 103  CO2 14*   < > 20* 21* 23 22 19*  GLUCOSE 74   < > 110* 106* 105* 96 101*  BUN 28*   < > 22 17 16 14 16   CREATININE 1.17*   < > 1.21* 1.13* 1.26* 1.28* 2.05*  CALCIUM  8.5*   < > 8.1* 8.2* 7.9*  8.3* 7.9*  PROT 6.5  --  6.1* 5.8*  --   --   --   ALBUMIN 3.2*  --  2.8* 2.4*  --   --   --   AST 22  --  15 15  --   --   --   ALT 10  --  12 10  --   --   --   ALKPHOS 123  --  111 109  --   --   --   BILITOT 0.9  --  0.7 0.8  --   --   --   GFRNONAA 50*   < > 48* 52* 45* 45* 25*  ANIONGAP 16*   < > 9 8 8 9 11    < > = values in this interval not displayed.     Hematology Recent Labs  Lab 07/30/24 0234 07/31/24 0312 08/01/24 0327  WBC 4.9 6.0 5.1  RBC 2.41* 2.64* 2.22*  HGB 8.6* 9.6* 8.1*  HCT 27.2* 29.4* 24.9*  MCV 112.9* 111.4* 112.2*  MCH 35.7* 36.4* 36.5*  MCHC 31.6 32.7 32.5  RDW 15.0 14.8 14.8  PLT 242 259 227    Cardiac EnzymesNo results for input(s): TROPONINI in the last 168 hours. No results for input(s): TROPIPOC in the last 168 hours.   BNP Recent Labs  Lab 07/27/24 0930  BNP 1,340.4*     DDimer No results for input(s): DDIMER in the last 168 hours.   Radiology    No results found.  Cardiac Studies   Echocardiogram 07/27/2024: EF 2025% with global  hypokinesis and located left atrial pressure Severely reduced RV systolic function  Patient Profile     73 y.o. female with past medical history of CAD s/p CABG in 2021, recurrent UTI, carotid artery disease, chronic HFrEF with recovered EF, paroxysmal atrial fibrillation s/p left atrial appendage ligation with residual stump, renal artery stenosis, mesenteric artery stenosis, CKD 3, COPD on home O2, hypertension for home cardiology was consulted by Dr. Eben for atrial fibrillation with RVR.  He is in the ED EKG showed A-fib with RVR, troponin negative x 2, BNP 1340, Hb 9.9 (baseline 11-12) with positive fecal occult.    Previously on amiodarone  but due to home O2 dependency with COPD decision was made to discontinue amiodarone .  It was felt that if she developed recurrent atrial fibrillation and Tikosyn may be a good option.  Spironolactone  was discontinued in the past due to hyperkalemia and Lasix  was made as needed.  Assessment & Plan   Persistent atrial fibrillation s/p left atrial appendage ligation with residual stump on 2024 noncardiac CT- history of hyperkalemia and current AKI makes digoxin less optimal.  Currently on Eliquis  5 mg twice daily.  Currently on amiodarone  infusion which was previously discontinued as an outpatient due to COPD with O2 dependency.  Can discontinue amiodarone .  Continue Lopressor  12.5 mg every 6 hours.  CAD s/p CABG- on Eliquis  and pravastatin  HFrEF- euvolemic. ARNI stopped to allow for BP and rate control.  Unable to tolerate MRA due to hyperkalemia or SGLT2 due to recurrent UTIs. BB as above. No signs of shock.  Positive fecal occult with multiple colonic polyps diverticulosis and ileocecal valve ulcers s/p clip 07/31/2024- has restarted Eliquis  with no sign of bleeding but has had a drop in Hb.  Hypotension- she is mentating well without any signs of bleeding. hemoglobin dropped from 9.6-> 8.1 overnight. Ok to give very gentle IV fluids (250 mL boluses) if  needed. Consider reaching  back out to GI if Hb continues to drop. Anemia - repeat H/H this afternoon to trend Hb. Consider reaching back out to GI if Hb continues to drop.     For questions or updates, please contact [facility] Please consult [email or web address] for contact info under        Signed, Emeline Calender, DO 08/01/2024, 8:45 AM

## 2024-08-01 NOTE — Progress Notes (Signed)
 Subjective: Monica Zamora is a Monica Zamora is a 73 year old female with a past medical history of Atrial fibrillation, HFimpEF (50-55% from 30-35% in July 2024), s/p CABG (2021), HTN, COPD (2L baseline), PUD, and alcohol use who is admitted for Afib with RVR complicated by HFrEF 20-25% and a presumed GI bleed.   Interval Update 11/17 Pt was restarted on Eliquis  yesterday. There were no acute events overnight. Vital signs showed decreasing BP overnight, and morning labs are were notable for Hgb 7.2 from 8.7. CK improved from 2409 to 1687.   Pt denies any noted blood in her stools or urine, denies fever, SOB, Chest pain, abdominal pain, and would like to go home. She understood that we were still evaluating her heart and our concern for her dropping blood pressure.   Objective:  Vital signs in last 24 hours: Vitals:   08/01/24 0840 08/01/24 0912 08/01/24 0943 08/01/24 1037  BP:  103/61  (!) 96/59  Pulse: 97 (!) 105 (!) 150 75  Resp:  (!) 21  20  Temp: 98.1 F (36.7 C) 98.1 F (36.7 C)  98 F (36.7 C)  TempSrc: Oral Oral  Oral  SpO2:  92% 92% 93%  Weight:      Height:        Physical Exam Vitals and nursing note reviewed. Exam conducted with a chaperone present.  Constitutional:      General: She is not in acute distress. Cardiovascular:     Rate and Rhythm: Normal rate and regular rhythm.     Heart sounds: No murmur heard. Pulmonary:     Effort: Pulmonary effort is normal. No respiratory distress.     Comments: On 2L [place] (baseline) Abdominal:     Palpations: Abdomen is soft.     Tenderness: There is no abdominal tenderness.  Musculoskeletal:     Right lower leg: No edema.     Left lower leg: No edema.  Skin:    General: Skin is warm and dry.  Neurological:     Mental Status: She is alert.       Latest Ref Rng & Units 08/01/2024    3:27 AM 07/31/2024    3:12 AM 07/30/2024    2:34 AM  CBC  WBC 4.0 - 10.5 K/uL 5.1  6.0  4.9   Hemoglobin 12.0 - 15.0  g/dL 8.1  9.6  8.6   Hematocrit 36.0 - 46.0 % 24.9  29.4  27.2   Platelets 150 - 400 K/uL 227  259  242         Latest Ref Rng & Units 08/01/2024    3:27 AM 07/31/2024    3:12 AM 07/30/2024    2:34 AM  BMP  Glucose 70 - 99 mg/dL 898  96  894   BUN 8 - 23 mg/dL 16  14  16    Creatinine 0.44 - 1.00 mg/dL 7.94  8.71  8.73   Sodium 135 - 145 mmol/L 133  132  134   Potassium 3.5 - 5.1 mmol/L 4.0  4.3  4.4   Chloride 98 - 111 mmol/L 103  101  103   CO2 22 - 32 mmol/L 19  22  23    Calcium  8.9 - 10.3 mg/dL 7.9  8.3  7.9     Assessment/Plan:  Principal Problem:   Atrial fibrillation with RVR (HCC) Active Problems:   COPD (chronic obstructive pulmonary disease) (HCC)   CKD stage 3a, GFR 45-59 ml/min (HCC)  HFrEF (heart failure with reduced ejection fraction) (HCC)   Hyperkalemia   Melena   Anemia   Alcohol use   Chest pain   Chronic respiratory failure (HCC)   Anticoagulated   Gastrointestinal hemorrhage   Atrophic gastritis without hemorrhage   Ileocecal valve ulcer   # Afib RVR: # Hypotension # Anemia Patient remains in afib with rate control on amiodarone  drip and lopressor  12.5 mg q6 hours. Restarted Eliquis , but now have concern for possible bleed given lab values and lowering BP. Hgb dropped from 9.6 to 8.1. Plan: -Continue to follow along with cardiology, appreciate their recommendations   -DCd amiodarone   -Continue Lopressor  12.5 mg q6 hours - 250mL boluses trying to increase BP without overloading with EF 20-25% - H/H to re-evalute Hgb  #HFrEF 20-25%: Appears euvolemic on exam today. Suspect patient's new worsening LVEF is due to uncontrolled afib with RVR for weeks prior to presentation. Plan: -Holding ARNI to allow more BP room while achieving rate control -Holding spironolactone  2/2 hyperkalemia   #COPD #Chronic Respiratory Failure Pt has a history of COPD, is on chronic O2 at home, but not taking any inhalers. No concern for acute exacerbation at this  time.  - Stop Pulmicort nebulizers - Ipratropium 0.5mg  nebulizer Q4H PRN - 2nd line - Start Albuterol  Q6h PRN for SOB - 1st line - Start Anoro Ellipta 1 puff daily  #CKD Stage 3 #Hyperkalemia Hx of CKD - Monitor Creatinine, caution with nephrotoxic medications - Hold spironolactone  - s/p lokelma  #Colon lumen narrowing on CT: #ABLA: Patient underwent EGD which did not reveal source of GI bleed. Colonoscopy had 2 polyps, 2 ulcers, and diverticulitis, and GI approved restarting Eliquis . Plan: -Appreciate GI's recommendations moving forward -Diet regular diet per GI - Restarted Eliquis  11/16  #Alcohol Use -CIWA without ativan   #Left renal upper pole segmental nonenhancement without cortical atrophy, most consistent with an acute to subacute cortical infarct Found incidentally on CT scan. Unable to anticoagulate patient with her home eliquis  at this moment. Serum creatinine remains stable at this time.   Resolved Problems:  __________________________________  Code Status: Full VTE Prophylaxis:SCDs Diet: Regular Barriers to Discharge: GI Bleed evaluation, Afib RVR  Dispo: Anticipated discharge in approximately 2-3 day(s).   Monica Nancyann NOVAK, DO - PGY1 08/01/2024, 11:42 AM Please contact the on call pager at: [phone]

## 2024-08-01 NOTE — Progress Notes (Signed)
 Physical Therapy Treatment Patient Details Name: Monica Zamora MRN: [id number] DOB: 26-Jun-1951 Today's Date: 08/01/2024   History of Present Illness 73 y/o F adm 07/27/24 with SOB, DOE, CHf exacerbation, Afib with RVR, melena. 11/14 EGD. 11/16 colonoscopy. PMHx: Afib, CAD, COPD, HFmrEF, HTN, PVD, HLD    PT Comments  Pt pleasant with roommate at home to assist. Pt on RA with SPO2 92% and reports she has not checked SPO2 off supplemental O2 in over a year. Pt able to walk on 1L with SPO2 >90%. Pt educated for progressive gait, encouraged mobility out of the house and continued HEP. HHPT is appropriate.     If plan is discharge home, recommend the following: Assistance with cooking/housework;Assist for transportation   Can travel by private vehicle        Equipment Recommendations  None recommended by PT    Recommendations for Other Services       Precautions / Restrictions Precautions Precautions: Fall Recall of Precautions/Restrictions: Intact     Mobility  Bed Mobility Overal bed mobility: Needs Assistance Bed Mobility: Supine to Sit     Supine to sit: Min assist     General bed mobility comments: bed flat with rail, hand held assist to elevate trunk from surface    Transfers Overall transfer level: Needs assistance   Transfers: Sit to/from Stand     Step pivot transfers: Supervision       General transfer comment: cues for hand placement, brake use and safety from bed and rollator    Ambulation/Gait Ambulation/Gait assistance: Contact guard assist Gait Distance (Feet): 100 Feet Assistive device: Rollator (4 wheels) Gait Pattern/deviations: Step-through pattern, Decreased stride length   Gait velocity interpretation: <1.8 ft/sec, indicate of risk for recurrent falls   General Gait Details: rollator with fatigue after 100' with HR 150 Afib and seated rest, SPo2 92% on 1L throughout gait and pt walked additional 30' after seated rest   Stairs              Wheelchair Mobility     Tilt Bed    Modified Rankin (Stroke Patients Only)       Balance Overall balance assessment: Needs assistance Sitting-balance support: No upper extremity supported, Feet supported Sitting balance-Leahy Scale: Good     Standing balance support: Single extremity supported, No upper extremity supported, During functional activity Standing balance-Leahy Scale: Poor                              Communication Communication Communication: No apparent difficulties  Cognition Arousal: Alert Behavior During Therapy: WFL for tasks assessed/performed   PT - Cognitive impairments: No apparent impairments                         Following commands: Intact      Cueing Cueing Techniques: Verbal cues  Exercises General Exercises - Lower Extremity Long Arc Quad: AROM, Both, 15 reps, Seated, Strengthening Hip Flexion/Marching: AROM, Strengthening, Seated, 15 reps    General Comments        Pertinent Vitals/Pain Pain Assessment Pain Assessment: No/denies pain    Home Living                          Prior Function            PT Goals (current goals can now be found in the care plan section) Progress towards  PT goals: Progressing toward goals    Frequency    Min 2X/week      PT Plan      Co-evaluation              AM-PAC PT 6 Clicks Mobility   Outcome Measure    Help needed moving from lying on your back to sitting on the side of a flat bed without using bedrails?: None Help needed moving to and from a bed to a chair (including a wheelchair)?: A Little Help needed standing up from a chair using your arms (e.g., wheelchair or bedside chair)?: A Little Help needed to walk in hospital room?: A Little Help needed climbing 3-5 steps with a railing? : A Lot 6 Click Score: 15    End of Session Equipment Utilized During Treatment: Oxygen;Gait belt Activity Tolerance: Patient tolerated  treatment well;Patient limited by fatigue Patient left: in chair;with call bell/phone within reach Nurse Communication: Mobility status PT Visit Diagnosis: Unsteadiness on feet (R26.81);Difficulty in walking, not elsewhere classified (R26.2)     Time: 9142-9070 PT Time Calculation (min) (ACUTE ONLY): 32 min  Charges:    $Gait Training: 8-22 mins $Therapeutic Activity: 8-22 mins PT General Charges $$ ACUTE PT VISIT: 1 Visit                     Lenoard SQUIBB, PT Acute Rehabilitation Services Office: [phone]    Lenoard NOVAK Angeliyah Kirkey 08/01/2024, 9:47 AM

## 2024-08-01 NOTE — Progress Notes (Addendum)
 Patient ID: Monica Zamora, female   DOB: 03-16-1951, 73 y.o.   MRN: [id number]    Progress Note   Subjective   Day 5 CC: melena  Eliquis    Colonoscopy yesterday-finding of 2-10 mm ulcers at the ileocecal valve felt to be source of GI bleeding, treated with hemoclips also 2 small polyps removed-biopsies taken, also noted diverticulosis   Labs today-WBC 5.1/hemoglobin 8.1/hematocrit 24.9-down from hemoglobin of 8.6> 9.6 Sodium 133/potassium 4.0/BUN 16/creatinine 2.05  Patient says she feels good asking about going home.  She has no complaints of abdominal pain has not had any melena or hematochezia since the procedure.  Denies any shortness of breath, says she has been up ambulating with physical therapy    Objective   Vital signs in last 24 hours: Temp:  [97.4 F (36.3 C)-98.3 F (36.8 C)] 98 F (36.7 C) (11/17 1037) Pulse Rate:  [72-150] 75 (11/17 1037) Resp:  [18-23] 20 (11/17 1037) BP: (80-127)/(52-96) 96/59 (11/17 1037) SpO2:  [90 %-100 %] 93 % (11/17 1037) Weight:  [80.4 kg] 80.4 kg (11/17 0600) Last BM Date : 07/30/24 General:   Older white female in NAD Heart:  irRegular rate and rhythm; no murmurs-heart rate 110 Lungs: Respirations even and unlabored, lungs CTA bilaterally Abdomen:  Soft, nontender and nondistended. Normal bowel sounds. Extremities:  Without edema. Neurologic:  Alert and oriented,  grossly normal neurologically. Psych:  Cooperative. Normal mood and affect.  Intake/Output from previous day: 11/16 0701 - 11/17 0700 In: 613.4 [P.O.:237; I.V.:376.4] Out: 1000 [Urine:1000] Intake/Output this shift: Total I/O In: 120 [P.O.:120] Out: -   Lab Results: Recent Labs    07/30/24 0234 07/31/24 0312 08/01/24 0327  WBC 4.9 6.0 5.1  HGB 8.6* 9.6* 8.1*  HCT 27.2* 29.4* 24.9*  PLT 242 259 227   BMET Recent Labs    07/30/24 0234 07/31/24 0312 08/01/24 0327  NA 134* 132* 133*  K 4.4 4.3 4.0  CL 103 101 103  CO2 23 22 19*  GLUCOSE 105* 96  101*  BUN 16 14 16   CREATININE 1.26* 1.28* 2.05*  CALCIUM  7.9* 8.3* 7.9*   LFT No results for input(s): PROT, ALBUMIN, AST, ALT, ALKPHOS, BILITOT, BILIDIR, IBILI in the last 72 hours. PT/INR No results for input(s): LABPROT, INR in the last 72 hours.      Assessment / Plan:    #16 74 year old white female admitted on 07/27/2024 with A-fib with RVR, on Eliquis .  Had noted progressive anemia and report of melena within a few days of admission.  EGD 03/28/2024 normal esophagus, mild mucosal gastric atrophy no cause for melena noted  Colonoscopy was done yesterday/Dr. Rollin with finding of 2 ulcers in the ileocecal valve, 10 mm each these were treated with hemoclips, no stigmata of recent bleeding noted, biopsies taken 2 small sessile polyps were also removed 2 to 4 mm in size  Path is pending  #2 anemia--acute on chronic-hemoglobin overall stable drifted a bit since yesterday Patient has not had any bowel movement since procedure  #3 A-fib with RVR #4 mild hypotension #5 coronary artery disease status post CABG 2021 #6 COPD-on home O2  Plan; Eliquis  has been restarted Will need to continue to follow hemoglobin if patient is discharged, will need follow-up hemoglobin within a week to ensure stability-follow-up hemoglobin can be done by the medicine service at outpatient appointment Patient Will be notified of biopsy results Okay from GI perspective for discharge Patient aware to continue to watch her stools and aware that she would  need to return to the hospital for any recurrent bleeding.     Principal Problem:   Atrial fibrillation with RVR (HCC) Active Problems:   COPD (chronic obstructive pulmonary disease) (HCC)   CKD stage 3a, GFR 45-59 ml/min (HCC)   HFrEF (heart failure with reduced ejection fraction) (HCC)   Hyperkalemia   Melena   Anemia   Alcohol use   Chest pain   Chronic respiratory failure (HCC)   Anticoagulated   Gastrointestinal  hemorrhage   Atrophic gastritis without hemorrhage   Ileocecal valve ulcer     LOS: 4 days   Amy EsterwoodPA-C  08/01/2024, 11:55 AM    Attending physician's note   I have taken a history, reviewed the chart, and examined the patient. I performed a substantive portion of this encounter, including complete performance of at least one of the key components, in conjunction with the APP. I agree with the APP's note, impression, and recommendations with my edits.   [address] DO, FACG [phone] office

## 2024-08-02 ENCOUNTER — Other Ambulatory Visit (HOSPITAL_COMMUNITY): Payer: Self-pay

## 2024-08-02 ENCOUNTER — Ambulatory Visit: Payer: Self-pay | Admitting: Gastroenterology

## 2024-08-02 DIAGNOSIS — I4891 Unspecified atrial fibrillation: Secondary | ICD-10-CM | POA: Diagnosis not present

## 2024-08-02 LAB — CBC
HCT: 25 % — ABNORMAL LOW (ref 36.0–46.0)
Hemoglobin: 8 g/dL — ABNORMAL LOW (ref 12.0–15.0)
MCH: 35.6 pg — ABNORMAL HIGH (ref 26.0–34.0)
MCHC: 32 g/dL (ref 30.0–36.0)
MCV: 111.1 fL — ABNORMAL HIGH (ref 80.0–100.0)
Platelets: 192 K/uL (ref 150–400)
RBC: 2.25 MIL/uL — ABNORMAL LOW (ref 3.87–5.11)
RDW: 14.8 % (ref 11.5–15.5)
WBC: 4 K/uL (ref 4.0–10.5)
nRBC: 0 % (ref 0.0–0.2)

## 2024-08-02 LAB — BASIC METABOLIC PANEL WITH GFR
Anion gap: 9 (ref 5–15)
BUN: 20 mg/dL (ref 8–23)
CO2: 22 mmol/L (ref 22–32)
Calcium: 8.2 mg/dL — ABNORMAL LOW (ref 8.9–10.3)
Chloride: 103 mmol/L (ref 98–111)
Creatinine, Ser: 2.45 mg/dL — ABNORMAL HIGH (ref 0.44–1.00)
GFR, Estimated: 20 mL/min — ABNORMAL LOW (ref >60)
Glucose, Bld: 107 mg/dL — ABNORMAL HIGH (ref 70–99)
Potassium: 3.9 mmol/L (ref 3.5–5.1)
Sodium: 134 mmol/L — ABNORMAL LOW (ref 135–145)

## 2024-08-02 LAB — MAGNESIUM: Magnesium: 2.2 mg/dL (ref 1.7–2.4)

## 2024-08-02 LAB — SURGICAL PATHOLOGY

## 2024-08-02 MED ORDER — FUROSEMIDE 40 MG PO TABS: 40.0000 mg | ORAL_TABLET | Freq: Every day | ORAL | Status: DC

## 2024-08-02 MED ORDER — METOPROLOL TARTRATE 25 MG PO TABS
12.5000 mg | ORAL_TABLET | Freq: Four times a day (QID) | ORAL | 0 refills | Status: DC
  Filled 2024-08-02: qty 60, 30d supply, fill #0

## 2024-08-02 MED ORDER — FUROSEMIDE 40 MG PO TABS
40.0000 mg | ORAL_TABLET | Freq: Every day | ORAL | 0 refills | Status: DC
  Filled 2024-08-02: qty 30, 30d supply, fill #0

## 2024-08-02 MED ORDER — LACTATED RINGERS IV BOLUS: 250.0000 mL | Freq: Once | INTRAVENOUS | Status: DC

## 2024-08-02 MED ORDER — UMECLIDINIUM-VILANTEROL 62.5-25 MCG/ACT IN AEPB
1.0000 | INHALATION_SPRAY | Freq: Every day | RESPIRATORY_TRACT | 0 refills | Status: DC
  Filled 2024-08-02: qty 60, 30d supply, fill #0

## 2024-08-02 MED ORDER — POTASSIUM CHLORIDE 20 MEQ PO PACK
40.0000 meq | PACK | Freq: Once | ORAL | Status: AC
  Administered 2024-08-02: 40 meq via ORAL
  Filled 2024-08-02: qty 2

## 2024-08-02 MED ORDER — SENNOSIDES-DOCUSATE SODIUM 8.6-50 MG PO TABS
1.0000 | ORAL_TABLET | Freq: Once | ORAL | Status: AC
  Administered 2024-08-02: 1 via ORAL
  Filled 2024-08-02: qty 1

## 2024-08-02 NOTE — Care Management Important Message (Signed)
 Important Message  Patient Details  Name: KIANNA BILLET MRN: [id number] Date of Birth: Aug 01, 1951   Important Message Given:  Yes - Medicare IM     [name] 08/02/2024, 11:52 AM

## 2024-08-02 NOTE — Discharge Instructions (Addendum)
 Thank you for allowing us  to be part of your care. You were hospitalized for Atrial Fibrillation and GI bleed.  See the changes in your medications and management of your chronic conditions below:  For your A. Fib, we have your heart rate controlled with metoprolol  and encourage you to continue taking your Eliquis .  For your Heart Failure, we encourage you to see your Primary care doctor soon to determine whether or not you can restart your Entresto , it can be very good for heart failure, but not if your blood pressure is too low. Please have your outpatient doctore re-evaluate your blood pressure and see if it would appropriate to restart that. It was not appropriate at your time of leaving the hospital.   For your COPD, we recommend starting Anoro ellipta, an inhaler that you should take 1 puff of every day. Then use albuterol  if your shortness of breath is still present. Doing so may decrease your need for Oxygen, but still use your oxygen if you feel short of breath after using these medications.  For the colonoscopy, the GI specialists recommend following up with them in their outpatient clinic in the next 1-2 weeks, at [facility]. Please call them and make an appointment to see them soon.  Please call your PCP or our clinic if you have any questions or concerns, we may be able to help and keep you from a long and expensive emergency room wait. Our clinic and after hours phone number is [phone]. The best time to call is Monday through Friday 9 am to 4 pm but there is always someone available 24/7 if you have an emergency. If you need medication refills please notify your pharmacy one week in advance and they will send us  a request.   We are glad you are feeling better,  Penne Mori; DO - PGY1 Internal Medicine Inpatient Teaching Service at [facility]

## 2024-08-02 NOTE — Discharge Summary (Signed)
 Name: Monica Zamora MRN: [id number] DOB: [birth year] 73 y.o. PCP: [name], NP  Date of Admission: 07/27/2024  9:24 AM Date of Discharge: 08/02/2024 Attending Physician: Dr. Reyes Fenton  Discharge Diagnosis: 1. Principal Problem:   Atrial fibrillation with RVR (HCC) Active Problems:   COPD (chronic obstructive pulmonary disease) (HCC)   CKD stage 3a, GFR 45-59 ml/min (HCC)   HFrEF (heart failure with reduced ejection fraction) (HCC)   Hyperkalemia   Melena   Anemia   Alcohol use   Chest pain   Chronic respiratory failure (HCC)   Anticoagulated   Gastrointestinal hemorrhage   Atrophic gastritis without hemorrhage   Ileocecal valve ulcer  Discharge Medications: Allergies as of 08/02/2024   No Known Allergies      Medication List     PAUSE taking these medications    Entresto  24-26 MG Wait to take this until your doctor or other care provider tells you to start again. Please discuss this with your primary care doctor. This can be a very good medication for people with heart failure, but it can decrease your blood pressure. Your blood pressure at time of leaving the hospital was low, so please check with your outpatient doctor before restarting it. Generic drug: sacubitril -valsartan  Take 1 tablet by mouth 2 (two) times daily.       STOP taking these medications    acetaminophen  500 MG tablet Commonly known as: TYLENOL    alendronate 70 MG tablet Commonly known as: FOSAMAX   lidocaine  5 % Commonly known as: LIDODERM        TAKE these medications    albuterol  108 (90 Base) MCG/ACT inhaler Commonly known as: VENTOLIN  HFA Inhale 1 puff into the lungs 2 (two) times daily as needed for wheezing or shortness of breath.   Anoro Ellipta 62.5-25 MCG/ACT Aepb Generic drug: umeclidinium-vilanterol Inhale 1 puff into the lungs daily.   apixaban  5 MG Tabs tablet Commonly known as: ELIQUIS  Take 1 tablet (5 mg total) by mouth 2 (two) times  daily.   cyclobenzaprine 10 MG tablet Commonly known as: FLEXERIL Take 10 mg by mouth at bedtime.   diazepam 5 MG tablet Commonly known as: VALIUM Take 5 mg by mouth 2 (two) times daily as needed for anxiety.   furosemide  40 MG tablet Commonly known as: Lasix  Take 1 tablet (40 mg total) by mouth daily. What changed:  medication strength how much to take when to take this additional instructions   furosemide  40 MG tablet Commonly known as: LASIX  Take 1 tablet (40 mg total) by mouth daily. What changed: You were already taking a medication with the same name, and this prescription was added. Make sure you understand how and when to take each.   metoprolol  succinate 25 MG 24 hr tablet Commonly known as: TOPROL -XL Take 1 tablet (25 mg total) by mouth at bedtime. Take with or immediately following a meal.   OXYGEN Inhale into the lungs as needed.   potassium chloride  SA 20 MEQ tablet Commonly known as: KLOR-CON  M Take 1 tablet (20 mEq total) by mouth as needed. Only take potassium on the day you take lasix  What changed:  when to take this additional instructions   pravastatin  20 MG tablet Commonly known as: PRAVACHOL  Take 1 tablet (20 mg total) by mouth daily at 6 PM.        Disposition and follow-up:   Monica Zamora was discharged from [facility] in Stable condition.  At the hospital follow up  visit please address:  1.  Please recheck Hgb and Cr on follow-up, as Hgb was low after restarting Eliquis , but stable. Cr was increasing and thought to be contrast induced.  2. Pt should follow up with [facility] within the next 2 weeks, and Cardiology as listed below.  Follow-up Appointments:  Contact information for follow-up providers     [facility]. Go in 5 day(s).   Specialty: Cardiology Why: Hospital follow up 08/05/2024 @ 2 pm PLEASE bring a current medication list to appointment FREE valet  parking, Entrance C, off [address] [facility] [address] and [facility] entrance Contact information: [address] [place]  [phone]        Baird Comer GAILS, NP Follow up.   Specialty: Adult Health Nurse Practitioner Why: Please follow up in a week. Contact information: [address] [place] [address] [phone]              Contact information for after-discharge care     Home Medical Care     [facility]) .   Service: Home Health Services Why: Agency will call you to set up apt times Contact information: [address] [place]  [phone]                     Hospital Course by problem list: Monica Zamora is a 73 y.o. person living with a history of Atrial fibrillation, HFimpEF (50-55% from 30-35% in July 2024), s/p CABG (2021), HTN, COPD (2L baseline), PUD, and alcohol use who presented with Shortness of breath and admitted for Atrial Fibrillation   now being discharged on hospital day 5 with the following pertinent hospital course:  # GI Bleed # Colon lumen narrowing on CT: # ABLA: Pt history was concerning for a GI bleed, and FOBT was positive. Eliquis  was held. EGD was negative, CT Abdomen showed a 3cm proximal decending colon narrowing, and a Colonoscopy had 1 polyp in ascending colon, 1 polyp in descending colon, 2 ulcers at ileocecal valve, and Diverticulosis in the sigmoid colon. Eliquis  was restarted. Hgb was stable after restarting Eliquis , but recommend an H&H in the outpatient to verify stability.   #Afib RVR: Pt was admitted with afib where Eliquis  had to be temporarily held due to GI bleed (see above). Pt was rate controlled with metoprolol  and amiodarone  was unable to obtain rhythm control and discontinued. Cardiology recommended Lasix  40mg  daily and consideration for Jardiance to be restarted  by PCP for outpatient management.  #HFrEF 20-25% Pt previously had EF of 50-55% after cardioversion about a year and a half ago when her EF was 30-35% with Afib. EF 20-25% this admission while in Afib. Stayed euvolemic on exam. Treated Afib as above.   #COPD #Chronic Respiratory Failure Pt has a history of COPD, is on chronic O2 at home, but not requiring any inhalers at home. No concern for acute exacerbation during her course. Anoro daily with PRN albuterol  was sufficient to handle respiratory symptoms while admitted.  #CKD Stage 3 #Hyperkalemia Pt has a history of CKD. Her Cr was monitored and nephrotoxic medications were not preferred. Cr was elevating the two days prior to discharge to 2.45 was at time of discharge, and this was suspected to be a contrast-induced AKI, and we recommended follow up with PCP same week for Cr followup.   #Alcohol Use Pt has a  history of alcohol use, CIWA was monitored and no PRN interventions were required.  #Incidental findings - Left renal upper pole segmental nonenhancement without cortical atrophy, most consistent with an acute to subacute cortical infarct     Subjective  11/18: Patient upset that she wasn't told discharge plans until late yesterday. Says she hasn't been peeing a lot because she hasn't been taking any lasix . Says she has follow up with her PCP on Friday. Used oxygen at home and got great sleep. Denies fevers, SOB, CP, abd pain.  Discharge Exam:   BP (!) 116/59   Pulse 89   Temp 97.9 F (36.6 C) (Oral)   Resp 19   Ht 5' 3 (1.6 m)   Wt 80.4 kg   SpO2 90%   BMI 31.41 kg/m   Physical Exam Vitals and nursing note reviewed. Exam conducted with a chaperone present.  Constitutional:      General: She is not in acute distress. Cardiovascular:     Rate and Rhythm: Normal rate and regular rhythm.     Heart sounds: No murmur heard. Pulmonary:     Effort: Pulmonary effort is normal. No respiratory distress.     Comments: On 2L   (baseline) Abdominal:     Palpations: Abdomen is soft.     Tenderness: There is no abdominal tenderness.  Musculoskeletal:     Right lower leg: No edema.     Left lower leg: No edema.  Skin:    General: Skin is warm and dry.  Neurological:     Mental Status: She is alert.   Pertinent Labs, Studies, and Procedures:     Latest Ref Rng & Units 08/02/2024    3:39 AM 08/01/2024   12:42 PM 08/01/2024    3:27 AM  CBC  WBC 4.0 - 10.5 K/uL 4.0   5.1   Hemoglobin 12.0 - 15.0 g/dL 8.0  8.8  8.1   Hematocrit 36.0 - 46.0 % 25.0  27.2  24.9   Platelets 150 - 400 K/uL 192   227        Latest Ref Rng & Units 08/02/2024    3:39 AM 08/01/2024   12:42 PM 08/01/2024    3:27 AM  CMP  Glucose 70 - 99 mg/dL 892  893  898   BUN 8 - 23 mg/dL 20  16  16    Creatinine 0.44 - 1.00 mg/dL 7.54  7.94  7.94   Sodium 135 - 145 mmol/L 134  132  133   Potassium 3.5 - 5.1 mmol/L 3.9  4.2  4.0   Chloride 98 - 111 mmol/L 103  100  103   CO2 22 - 32 mmol/L 22  22  19    Calcium  8.9 - 10.3 mg/dL 8.2  8.2  7.9     ECHOCARDIOGRAM COMPLETE Result Date: 07/27/2024    ECHOCARDIOGRAM REPORT   Patient Name:   Monica Zamora Date of Exam: 07/27/2024 Medical Rec #:  [id number]           Height:       63.0 in Accession #:    [id number]          Weight:       169.0 lb Date of Birth:  [birth year]           BSA:          1.800 m Patient Age:    73 years            BP:  100/63 mmHg Patient Gender: F                   HR:           120 bpm. Exam Location:  Inpatient Procedure: 2D Echo, Cardiac Doppler, Color Doppler and Intracardiac            Opacification Agent (Both Spectral and Color Flow Doppler were            utilized during procedure). Indications:    A-fib  History:        Patient has prior history of Echocardiogram examinations, most                 recent 02/17/2024. CAD, Prior CABG, COPD, Arrythmias:Atrial                 Fibrillation, Signs/Symptoms:Chest Pain; Risk Factors:Former                 Smoker  and Hypertension.  Sonographer:    Juliene Rucks Referring Phys: [id number] MEGAN L KAMMERER IMPRESSIONS  1. Left ventricular ejection fraction, by estimation, is 20 to 25%. The left ventricle has severely decreased function. The left ventricle demonstrates global hypokinesis. Left ventricular diastolic function could not be evaluated. Elevated left atrial pressure.  2. Right ventricular systolic function is severely reduced. The right ventricular size is normal. Tricuspid regurgitation signal is inadequate for assessing PA pressure.  3. The mitral valve is degenerative. No evidence of mitral valve regurgitation. No evidence of mitral stenosis.  4. The aortic valve is normal in structure. Aortic valve regurgitation is not visualized. No aortic stenosis is present. Comparison(s): Changes from prior study are noted. LV function has worsened. FINDINGS  Left Ventricle: Left ventricular ejection fraction, by estimation, is 20 to 25%. The left ventricle has severely decreased function. The left ventricle demonstrates global hypokinesis. Definity contrast agent was given IV to delineate the left ventricular endocardial borders. The left ventricular internal cavity size was normal in size. There is no left ventricular hypertrophy. Left ventricular diastolic function could not be evaluated due to atrial fibrillation. Left ventricular diastolic function could not be evaluated. Elevated left atrial pressure. Right Ventricle: The right ventricular size is normal. No increase in right ventricular wall thickness. Right ventricular systolic function is severely reduced. Tricuspid regurgitation signal is inadequate for assessing PA pressure. Left Atrium: Left atrial size was normal in size. Right Atrium: Right atrial size was normal in size. Pericardium: There is no evidence of pericardial effusion. Mitral Valve: The mitral valve is degenerative in appearance. Mild mitral annular calcification. No evidence of mitral valve  regurgitation. No evidence of mitral valve stenosis. MV peak gradient, 7.3 mmHg. The mean mitral valve gradient is 3.0 mmHg. Tricuspid Valve: The tricuspid valve is normal in structure. Tricuspid valve regurgitation is mild . No evidence of tricuspid stenosis. Aortic Valve: The aortic valve is normal in structure. Aortic valve regurgitation is not visualized. No aortic stenosis is present. Pulmonic Valve: The pulmonic valve was normal in structure. Pulmonic valve regurgitation is trivial. No evidence of pulmonic stenosis. Aorta: The aortic root is normal in size and structure. Venous: The inferior vena cava was not well visualized. IAS/Shunts: No atrial level shunt detected by color flow Doppler.  LEFT VENTRICLE PLAX 2D LVIDd:         5.00 cm   Diastology LVIDs:         3.80 cm   LV e' medial:  5.82 cm/s LV PW:  1.25 cm   LV e' lateral: 7.70 cm/s LV IVS:        1.10 cm LVOT diam:     2.00 cm LV SV:         44 LV SV Index:   25 LVOT Area:     3.14 cm  LEFT ATRIUM           Index        RIGHT ATRIUM           Index LA diam:      3.80 cm 2.11 cm/m   RA Area:     18.70 cm LA Vol (A4C): 43.6 ml 24.22 ml/m  RA Volume:   53.80 ml  29.89 ml/m  AORTIC VALVE LVOT Vmax:   80.80 cm/s LVOT Vmean:  50.200 cm/s LVOT VTI:    0.141 m  AORTA Ao Root diam: 3.10 cm Ao Asc diam:  3.50 cm MITRAL VALVE              TRICUSPID VALVE MV Area VTI:  1.59 cm    TR Peak grad:   21.9 mmHg MV Peak grad: 7.3 mmHg    TR Vmax:        234.00 cm/s MV Mean grad: 3.0 mmHg MV Vmax:      1.35 m/s    SHUNTS MV Vmean:     70.4 cm/s   Systemic VTI:  0.14 m MR Peak grad: 84.3 mmHg   Systemic Diam: 2.00 cm MR Vmax:      459.00 cm/s Morene Brownie Electronically signed by Morene Brownie Signature Date/Time: 07/27/2024/4:22:50 PM    Final    DG Chest 1 View Result Date: 07/27/2024 EXAM: 1 VIEW(S) XRAY OF THE CHEST 07/27/2024 09:40:51 AM COMPARISON: 01/06/2024 CLINICAL HISTORY: sob FINDINGS: LUNGS AND PLEURA: Minimal bibasilar subsegmental  atelectasis or scarring is noted. No focal pulmonary opacity. No pulmonary edema. No pleural effusion. No pneumothorax. HEART AND MEDIASTINUM: Stable cardiomediastinal silhouette. Sternotomy wires are again noted. BONES AND SOFT TISSUES: Sternotomy wires are again noted. No acute osseous abnormality. IMPRESSION: 1. Minimal bibasilar subsegmental atelectasis or scarring. Electronically signed by: Lynwood Seip MD 07/27/2024 09:56 AM EST RP Workstation: [id number]     Discharge Instructions: Discharge Instructions     Call MD for:  difficulty breathing, headache or visual disturbances   Complete by: As directed    Call MD for:  extreme fatigue   Complete by: As directed    Call MD for:  hives   Complete by: As directed    Call MD for:  persistant dizziness or light-headedness   Complete by: As directed    Call MD for:  persistant nausea and vomiting   Complete by: As directed    Call MD for:  redness, tenderness, or signs of infection (pain, swelling, redness, odor or green/yellow discharge around incision site)   Complete by: As directed    Call MD for:  severe uncontrolled pain   Complete by: As directed    Call MD for:  temperature >100.4   Complete by: As directed    Increase activity slowly   Complete by: As directed        Signed: [name], DO 08/02/2024, 12:14 PM

## 2024-08-02 NOTE — TOC Transition Note (Addendum)
 Transition of Care [facility]) - Discharge Note   Patient Details  Name: Monica Zamora MRN: [id number] Date of Birth: [birth year]  Transition of [facility] CM/SW Contact:  Waddell Barnie Rama, RN Phone Number: 08/02/2024, 9:37 AM   Clinical Narrative:    For dc today, NCM offered choice, she does not have a preference, she is ok with [facility] for HHPt, HHOT,  they have accepted in the portal.  Soc will begin 24 to 48 hrs post dc. Patient states family will bring her home oxygen for her to go home with , 3 liters is her baseline.         Patient Goals and CMS Choice            Discharge Placement                       Discharge Plan and Services Additional resources added to the After Visit Summary for                                       Social Drivers of Health (SDOH) Interventions SDOH Screenings   Food Insecurity: No Food Insecurity (07/27/2024)  Housing: Unknown (07/29/2024)  Transportation Needs: No Transportation Needs (07/29/2024)  Utilities: Not At Risk (07/27/2024)  Alcohol Screen: Low Risk  (07/29/2024)  Depression (PHQ2-9): Low Risk  (06/24/2021)  Recent Concern: Depression (PHQ2-9) - Medium Risk (05/06/2021)  Financial Resource Strain: Low Risk  (07/29/2024)  Physical Activity: Unknown (12/10/2023)   Received from [facility]  Social Connections: Unknown (07/27/2024)  Stress: No Stress Concern Present (12/10/2023)   Received from [facility]  Recent Concern: Stress - Stress Concern Present (11/06/2023)   Received from [facility]  Tobacco Use: Medium Risk (07/31/2024)     Readmission Risk Interventions     No data to display

## 2024-08-02 NOTE — Progress Notes (Signed)
 Progress Note  Patient Name: Monica Zamora Date of Encounter: 08/02/2024  Primary Cardiologist: Redell Shallow, MD   Subjective   BP has improved. She is asymptomatic. GI has seen her again. No bleeding issues.   Inpatient Medications    Scheduled Meds:  apixaban   5 mg Oral BID   Influenza vac split trivalent PF  0.5 mL Intramuscular Tomorrow-1000   melatonin  3 mg Oral QHS   metoprolol  tartrate  12.5 mg Oral Q6H   pantoprazole  40 mg Oral Daily   pravastatin   20 mg Oral q1800   senna-docusate  1 tablet Oral Daily   umeclidinium-vilanterol  1 puff Inhalation Daily   Continuous Infusions:   PRN Meds: acetaminophen , albuterol , ipratropium, lidocaine , ondansetron , polyethylene glycol   Vital Signs    Vitals:   08/02/24 0343 08/02/24 0500 08/02/24 0723 08/02/24 0950  BP: 100/67  (!) 126/55 (!) 116/59  Pulse: (!) 108  89   Resp: 20  19   Temp: 98.1 F (36.7 C)  97.9 F (36.6 C)   TempSrc: Oral  Oral   SpO2: 100%  90%   Weight:  80.4 kg    Height:        Intake/Output Summary (Last 24 hours) at 08/02/2024 1000 Last data filed at 08/02/2024 9165 Gross per 24 hour  Intake 1090 ml  Output 625 ml  Net 465 ml   Filed Weights   07/30/24 0414 08/01/24 0600 08/02/24 0500  Weight: 75.6 kg 80.4 kg 80.4 kg    Telemetry    Atrial fibrillation with occasional RVR - Personally Reviewed  ECG    AF with RVR - Personally Reviewed  Physical Exam   Physical Exam Vitals and nursing note reviewed.  Constitutional:      Appearance: Normal appearance.  HENT:     Head: Normocephalic and atraumatic.  Eyes:     Conjunctiva/sclera: Conjunctivae normal.  Neck:     Vascular: No carotid bruit.  Cardiovascular:     Rate and Rhythm: Normal rate. Rhythm irregular.  Pulmonary:     Effort: Pulmonary effort is normal.     Breath sounds: Rales present.  Musculoskeletal:     Comments: Trace lower extremity pitting edema  Skin:    Coloration: Skin is not jaundiced or  pale.  Neurological:     Mental Status: She is alert.      Labs    Chemistry Recent Labs  Lab 07/27/24 0930 07/27/24 1235 07/28/24 0304 07/29/24 0226 07/30/24 0234 08/01/24 0327 08/01/24 1242 08/02/24 0339  NA 136   < > 132* 134*   < > 133* 132* 134*  K 5.2*   < > 4.6 4.4   < > 4.0 4.2 3.9  CL 106   < > 103 105   < > 103 100 103  CO2 14*   < > 20* 21*   < > 19* 22 22  GLUCOSE 74   < > 110* 106*   < > 101* 106* 107*  BUN 28*   < > 22 17   < > 16 16 20   CREATININE 1.17*   < > 1.21* 1.13*   < > 2.05* 2.05* 2.45*  CALCIUM  8.5*   < > 8.1* 8.2*   < > 7.9* 8.2* 8.2*  PROT 6.5  --  6.1* 5.8*  --   --   --   --   ALBUMIN 3.2*  --  2.8* 2.4*  --   --   --   --  AST 22  --  15 15  --   --   --   --   ALT 10  --  12 10  --   --   --   --   ALKPHOS 123  --  111 109  --   --   --   --   BILITOT 0.9  --  0.7 0.8  --   --   --   --   GFRNONAA 50*   < > 48* 52*   < > 25* 25* 20*  ANIONGAP 16*   < > 9 8   < > 11 10 9    < > = values in this interval not displayed.     Hematology Recent Labs  Lab 07/31/24 0312 08/01/24 0327 08/01/24 1242 08/02/24 0339  WBC 6.0 5.1  --  4.0  RBC 2.64* 2.22*  --  2.25*  HGB 9.6* 8.1* 8.8* 8.0*  HCT 29.4* 24.9* 27.2* 25.0*  MCV 111.4* 112.2*  --  111.1*  MCH 36.4* 36.5*  --  35.6*  MCHC 32.7 32.5  --  32.0  RDW 14.8 14.8  --  14.8  PLT 259 227  --  192    Cardiac EnzymesNo results for input(s): TROPONINI in the last 168 hours. No results for input(s): TROPIPOC in the last 168 hours.   BNP Recent Labs  Lab 07/27/24 0930  BNP 1,340.4*     DDimer No results for input(s): DDIMER in the last 168 hours.   Radiology    No results found.  Cardiac Studies   Echocardiogram 07/27/2024: EF 2025% with global hypokinesis and located left atrial pressure Severely reduced RV systolic function  Patient Profile     73 y.o. female with past medical history of CAD s/p CABG in 2021, recurrent UTI, carotid artery disease, chronic HFrEF with  recovered EF, paroxysmal atrial fibrillation s/p left atrial appendage ligation with residual stump, renal artery stenosis, mesenteric artery stenosis, CKD 3, COPD on home O2, hypertension for home cardiology was consulted by Dr. Eben for atrial fibrillation with RVR.  He is in the ED EKG showed A-fib with RVR, troponin negative x 2, BNP 1340, Hb 9.9 (baseline 11-12) with positive fecal occult.    Previously on amiodarone  but due to home O2 dependency with COPD decision was made to discontinue amiodarone .  It was felt that if she developed recurrent atrial fibrillation and Tikosyn may be a good option.  Spironolactone  was discontinued in the past due to hyperkalemia and Lasix  was made as needed.  Assessment & Plan   Persistent atrial fibrillation s/p left atrial appendage ligation with residual stump on 2024 noncardiac CT- history of hyperkalemia and current AKI makes digoxin less optimal.  Currently on Eliquis  5 mg twice daily. Amiodarone  infusion discontinued which was previously discontinued as an outpatient due to COPD with O2 dependency.  Switch metoprolol  tartrate to succinate 25 mg.  CAD s/p CABG- on Eliquis  and pravastatin  HFrEF- mildly volume overloaded. ARNI stopped to allow for BP control and renal dysfunction.  Unable to tolerate MRA due to hyperkalemia and renal function or SGLT2 due to recurrent UTIs. BB as above. No signs of shock. Start lasix  40 mg daily starting today. Will get a BMP and have her follow up in a week for volume assessment.  Positive fecal occult with multiple colonic polyps diverticulosis and ileocecal valve ulcers s/p clip 07/31/2024- has restarted Eliquis  with no sign of bleeding but has had a drop in Hb.  Hypotension-  BP has improved. she is mentating well without any signs of bleeding.  Anemia - GI followed up and she is ok to see them outpatient.   Patient is being discharged today. Starting lasix  as above. Recommend outpatient follow up in 1 week with the APP  with a BMP for volume assessment. Consider restarting ARNI at that time. Cardiology will sign off.   For questions or updates, please contact [facility] Please consult [email or web address] for contact info under    Time coordinating care: 36 minutes    Signed, Emeline Calender, DO 08/02/2024, 10:00 AM

## 2024-08-04 ENCOUNTER — Telehealth (HOSPITAL_COMMUNITY): Payer: Self-pay

## 2024-08-04 NOTE — Telephone Encounter (Signed)
 Called to confirm/remind patient of their appointment at [facility] on 08/05/24 2:00.   Appointment:   [] Confirmed  [x] Left mess   [] No answer/No voice mail  [] VM Full/unable to leave message  [] Phone not in service  Patient reminded to bring all medications and/or complete list.  Confirmed patient has transportation. Gave directions, instructed to utilize valet parking.

## 2024-08-04 NOTE — Progress Notes (Signed)
 HEART & VASCULAR TRANSITION OF CARE CONSULT NOTE     Referring Physician: Dr Eben Reid, Comer GAILS, NP  Cardiology: Dr Pietro.   Chief Complaint: Heart Failure   HPI: Referred to clinic by Dr Eben  for heart failure consultation.   Monica Zamora is a 73 y.o. female paroxysmal atrial fibrillation, hypertension, chronic hypoxemic respiratory failure on 2 L nasal cannula, CAD status post CABG in 2021, CAD, heart failure with reduced ejection fraction (EF 40%), CKD IIIa, hx of tobacco use.    Admitted on 03/13/2023 with substernal chest pain with radiation to the epigastrium.  In the ER patient noted to be hypotensive with systolic blood pressure in the 70s and bradycardic to the 30s.  She was given glucagon  to reverse home beta-blocker dose.  Workup at that time was remarkable for BNP greater than 1600, chest x-ray with pulmonary edema. CT scan on admission also notable for mesenteric & celiac artery stenosis. Today she underwent LHC/RHC; HF consulted due to severely elevated filling pressures.    Cardiac history dates back to 2021 when she underwent CABG at [facility] in [place] ; also noticed to have paroxysmal atrial fibrillation on Eliquis  and Multaq  since that time.  Echocardiogram in 2022 with a EF of 50 to 55%.  Repeat echocardiogram in June 2024 with decline in EF to 40 to 45% and severely elevated PASP of 77.    December 2024 EF improved to 50-55% and RV low normal.   Last in SR 12/2023  Admitted with 07/27/24 with ABLA and A fib RVR. Echo showed LVEF 20-25% and RV severely reduced. BNP >1200. Colonsocopy with bleeding ulcer treated with hemoclips. Initially on amio drip but stopped due to COPD. Placed on Toprol  XL at discharge. GDMT limited by worsening renal function.   Overall feeling fine. Using 2 liters oxygen chronically. Gets SOB off oxygen. SOB with ambulation. Denies PND/Orthopnea. Appetite ok. No fever or chills. Taking all medications.  Lives with a friend. He cooks and helps her with  medications. She is not sure that she has been taking Toprol  XL .    Cardiac Testing  07/2024 ECHO LVEF 20-25% RV severely reduced  2024 LVEF 35-40%. RV normal  Cath 2024- 3V disease.    Past Medical History:  Diagnosis Date   A-fib (HCC)    Carotid artery disease    COPD (chronic obstructive pulmonary disease) (HCC)    Coronary artery disease    Former tobacco use    Heart failure with mildly reduced ejection fraction (HFmrEF) (HCC)    Hx of CABG    Hypertension    PUD (peptic ulcer disease)     Current Outpatient Medications  Medication Sig Dispense Refill   albuterol  (VENTOLIN  HFA) 108 (90 Base) MCG/ACT inhaler Inhale 1 puff into the lungs 2 (two) times daily as needed for wheezing or shortness of breath.     apixaban  (ELIQUIS ) 5 MG TABS tablet Take 1 tablet (5 mg total) by mouth 2 (two) times daily. 180 tablet 1   cyclobenzaprine (FLEXERIL) 10 MG tablet Take 10 mg by mouth at bedtime.     diazepam (VALIUM) 5 MG tablet Take 5 mg by mouth 2 (two) times daily as needed for anxiety.     furosemide  (LASIX ) 40 MG tablet Take 1 tablet (40 mg total) by mouth daily. 30 tablet 0   metoprolol  succinate (TOPROL -XL) 25 MG 24 hr tablet Take 1 tablet (25 mg total) by mouth at bedtime. Take with  or immediately following a meal. 90 tablet 3   OXYGEN Inhale 2 L into the lungs continuous.     potassium chloride  SA (KLOR-CON  M) 20 MEQ tablet Take 1 tablet (20 mEq total) by mouth as needed. Only take potassium on the day you take lasix  (Patient taking differently: Take 20 mEq by mouth See admin instructions. Take 20mEq (1 tablet) by mouth every 5 days.) 30 tablet 1   pravastatin  (PRAVACHOL ) 20 MG tablet Take 1 tablet (20 mg total) by mouth daily at 6 PM. 90 tablet 3   umeclidinium-vilanterol (ANORO ELLIPTA ) 62.5-25 MCG/ACT AEPB Inhale 1 puff into the lungs daily. 60 each 0   furosemide  (LASIX ) 40 MG tablet Take 1 tablet (40 mg total) by mouth daily.  (Patient not taking: Reported on 08/05/2024) 30 tablet 0   [Paused] sacubitril -valsartan  (ENTRESTO ) 24-26 MG Take 1 tablet by mouth 2 (two) times daily. (Patient not taking: Reported on 08/05/2024)     No current facility-administered medications for this encounter.    No Known Allergies    Social History   Socioeconomic History   Marital status: Divorced    Spouse name: Not on file   Number of children: 2   Years of education: Not on file   Highest education level: 11th grade  Occupational History   Occupation: Retired  Tobacco Use   Smoking status: Former    Current packs/day: 0.00    Average packs/day: 1.5 packs/day for 54.9 years (82.3 ttl pk-yrs)    Types: Cigarettes    Start date: 09/16/1967    Quit date: 08/05/2022    Years since quitting: 2.0   Smokeless tobacco: Never  Vaping Use   Vaping status: Never Used  Substance and Sexual Activity   Alcohol use: Not Currently   Drug use: Never   Sexual activity: Not on file  Other Topics Concern   Not on file  Social History Narrative   Not on file   Social Drivers of Health   Financial Resource Strain: Low Risk  (07/29/2024)   Overall Financial Resource Strain (CARDIA)    Difficulty of Paying Living Expenses: Not hard at all  Food Insecurity: No Food Insecurity (07/27/2024)   Hunger Vital Sign    Worried About Running Out of Food in the Last Year: Never true    Ran Out of Food in the Last Year: Never true  Transportation Needs: No Transportation Needs (07/29/2024)   PRAPARE - Administrator, Civil Service (Medical): No    Lack of Transportation (Non-Medical): No  Physical Activity: Unknown (12/10/2023)   Received from [facility]   Exercise Vital Sign    On average, how many days per week do you engage in moderate to strenuous exercise (like a brisk walk)?: 0 days    Minutes of Exercise per Session: Not on file  Stress: No Stress Concern Present (12/10/2023)   Received from [facility] - Occupational Stress Questionnaire    Feeling of Stress : Only a little  Recent Concern: Stress - Stress Concern Present (11/06/2023)   Received from [facility] of Occupational Health - Occupational Stress Questionnaire    Feeling of Stress : Very much  Social Connections: Unknown (07/27/2024)   Social Connection and Isolation Panel    Frequency of Communication with Friends and Family: Three times a week    Frequency of Social Gatherings with Friends and Family: Three times a week  Attends Religious Services: More than 4 times per year    Active Member of Clubs or Organizations: No    Attends Banker Meetings: Never    Marital Status: Patient declined  Intimate Partner Violence: Not At Risk (07/27/2024)   Humiliation, Afraid, Rape, and Kick questionnaire    Fear of Current or Ex-Partner: No    Emotionally Abused: No    Physically Abused: No    Sexually Abused: No      Family History  Problem Relation Age of Onset   Lupus Mother    Lung disease Neg Hx     Vitals:   08/05/24 1409  BP: 126/76  Pulse: (!) 134  SpO2: 95%  Weight: 81.2 kg (179 lb)  Height: 5' 3 (1.6 m)   Wt Readings from Last 3 Encounters:  08/05/24 81.2 kg (179 lb)  08/02/24 80.4 kg (177 lb 4.8 oz)  04/26/24 76.7 kg (169 lb)    PHYSICAL EXAM: General:   No resp difficulty. Arrived in a wheelchair on 2 liters [place].  Neck: JVP 7-8 Cor: Tachy Irregular rate & rhythm.  Lungs: clear Abdomen: soft, nontender, nondistended.  Extremities: R and LLE trace edema. Neuro: alert & oriented x3     ASSESSMENT & PLAN: 1. Chronic HFrEF Echo 7/24 EF 35-40%, moderately decreased RV systolic function.  RHC 7/24: biventricular elevated filling pressures, low PAPi but preserved CI.  Pulmonary venous hypertension.  Suspect ischemic cardiomyopathy + component of cor pulmonale from COPD.  TEE 7/5 with EF 30-35%, moderate RV dysfunction, mod-severe TR,  IVC dilated.  Echo 2024 EF 50-55% RV normal.  Echo LVEF 20-25% down from previous 35-40% . Suspect EF worse with recent A fib RVR.  NYHA III.  Diuretic- Volume status stable. Continue lasix  40 mg daily. Stop potassium.  GDMT limited by worsening renal function.   BB-Continue Toprol  XL. Restart today.  Ace/ARB/ARNI- Will stop entresto  and remove from med list given recent creatinine 2.4.  MRA- No CKD Stage IV SGLT2i- no multiple UTIs   2. A Fib   She had cardioversion 2024 and had previously been in SR earlier this year.  She was back in A fib during recent admit. Rate higher today. She is asymptomatic and would like to discuss with Dr Pietro whether or not she needs cardioversion. She is adamant she does want to pursue.  Continue eliquis  5 mg twice a day  Increase Toprol  XL 50 mg at bed time.   Off amio due to COPD.   3. CKD Stage IV Creatinine baseline previously less that 2. Most recent creatinine 2.4.  Check BMET today.   4. CAD s/p CABG: Cath 7/2 with patent grafts.   -No chest pain.   - Continue statin.   5. Renal artery stenosis: By CTA.  Renal artery dopplers suggested 1-59% bilateral ICA stenosis.    6. Mesenteric artery stenosis: No intestinal angina reported.   Referred to HFSW (PCP, Medications, Transportation, ETOH Abuse, Drug Abuse, Insurance, Financial ): No Refer to Pharmacy:  No Refer to Home Health: Following  Refer to Advanced Heart Failure Clinic: NO  Refer to General Cardiology:  She has follow up with Dr Pietro next month.   Follow up as needed.   Nicolette Gieske NP-C  2:12 PM

## 2024-08-05 ENCOUNTER — Encounter (HOSPITAL_COMMUNITY): Payer: Self-pay

## 2024-08-05 ENCOUNTER — Ambulatory Visit (HOSPITAL_COMMUNITY): Payer: Self-pay | Admitting: Adult Health

## 2024-08-05 ENCOUNTER — Ambulatory Visit (HOSPITAL_COMMUNITY)
Admission: RE | Admit: 2024-08-05 | Discharge: 2024-08-05 | Disposition: A | Discharge status: Home / Self Care | Source: Ambulatory Visit | Attending: Adult Health | Admitting: Adult Health

## 2024-08-05 VITALS — BP 126/76 | HR 134 | Ht 63.0 in | Wt 179.0 lb

## 2024-08-05 DIAGNOSIS — N184 Chronic kidney disease, stage 4 (severe): Secondary | ICD-10-CM | POA: Insufficient documentation

## 2024-08-05 DIAGNOSIS — Z9981 Dependence on supplemental oxygen: Secondary | ICD-10-CM | POA: Diagnosis not present

## 2024-08-05 DIAGNOSIS — Z87891 Personal history of nicotine dependence: Secondary | ICD-10-CM | POA: Diagnosis not present

## 2024-08-05 DIAGNOSIS — N1832 Chronic kidney disease, stage 3b: Secondary | ICD-10-CM

## 2024-08-05 DIAGNOSIS — I5020 Unspecified systolic (congestive) heart failure: Secondary | ICD-10-CM

## 2024-08-05 DIAGNOSIS — I272 Pulmonary hypertension, unspecified: Secondary | ICD-10-CM | POA: Diagnosis not present

## 2024-08-05 DIAGNOSIS — Z951 Presence of aortocoronary bypass graft: Secondary | ICD-10-CM | POA: Diagnosis not present

## 2024-08-05 DIAGNOSIS — I130 Hypertensive heart and chronic kidney disease with heart failure and stage 1 through stage 4 chronic kidney disease, or unspecified chronic kidney disease: Secondary | ICD-10-CM | POA: Diagnosis present

## 2024-08-05 DIAGNOSIS — I25810 Atherosclerosis of coronary artery bypass graft(s) without angina pectoris: Secondary | ICD-10-CM

## 2024-08-05 DIAGNOSIS — I2510 Atherosclerotic heart disease of native coronary artery without angina pectoris: Secondary | ICD-10-CM | POA: Insufficient documentation

## 2024-08-05 DIAGNOSIS — Z7901 Long term (current) use of anticoagulants: Secondary | ICD-10-CM | POA: Insufficient documentation

## 2024-08-05 DIAGNOSIS — J9611 Chronic respiratory failure with hypoxia: Secondary | ICD-10-CM | POA: Insufficient documentation

## 2024-08-05 DIAGNOSIS — I480 Paroxysmal atrial fibrillation: Secondary | ICD-10-CM | POA: Insufficient documentation

## 2024-08-05 DIAGNOSIS — I701 Atherosclerosis of renal artery: Secondary | ICD-10-CM | POA: Diagnosis not present

## 2024-08-05 DIAGNOSIS — K551 Chronic vascular disorders of intestine: Secondary | ICD-10-CM | POA: Diagnosis not present

## 2024-08-05 DIAGNOSIS — I5022 Chronic systolic (congestive) heart failure: Secondary | ICD-10-CM | POA: Insufficient documentation

## 2024-08-05 DIAGNOSIS — I5042 Chronic combined systolic (congestive) and diastolic (congestive) heart failure: Secondary | ICD-10-CM | POA: Diagnosis not present

## 2024-08-05 DIAGNOSIS — N1831 Chronic kidney disease, stage 3a: Secondary | ICD-10-CM | POA: Diagnosis present

## 2024-08-05 DIAGNOSIS — Z79899 Other long term (current) drug therapy: Secondary | ICD-10-CM | POA: Insufficient documentation

## 2024-08-05 LAB — BASIC METABOLIC PANEL WITH GFR
Anion gap: 15 (ref 5–15)
BUN: 23 mg/dL (ref 8–23)
CO2: 21 mmol/L — ABNORMAL LOW (ref 22–32)
Calcium: 8.7 mg/dL — ABNORMAL LOW (ref 8.9–10.3)
Chloride: 103 mmol/L (ref 98–111)
Creatinine, Ser: 1.91 mg/dL — ABNORMAL HIGH (ref 0.44–1.00)
GFR, Estimated: 28 mL/min — ABNORMAL LOW (ref >60)
Glucose, Bld: 100 mg/dL — ABNORMAL HIGH (ref 70–99)
Potassium: 4.8 mmol/L (ref 3.5–5.1)
Sodium: 139 mmol/L (ref 135–145)

## 2024-08-05 MED ORDER — METOPROLOL SUCCINATE ER 50 MG PO TB24: 50.0000 mg | ORAL_TABLET | Freq: Every day | ORAL | 0 refills | Status: DC

## 2024-08-05 NOTE — Patient Instructions (Addendum)
 Medication Changes:  INCREASE Metoprolol  XL to 50 mg Daily at bedtime  STAY OFF Entresto  and Potassium  Lab Work:  Labs done today, your results will be available in MyChart, we will contact you for abnormal readings.  Special Instructions // Education:  Do the following things EVERYDAY: Weigh yourself in the morning before breakfast. Write it down and keep it in a log. Take your medicines as prescribed Eat low salt foods--Limit salt (sodium) to 2000 mg per day.  Stay as active as you can everyday Limit all fluids for the day to less than 2 liters   Follow-Up in: AS NEEDED, please follow-up with Dr Pietro as scheduled    At the [facility], you and your health needs are our priority. We have a designated team specialized in the treatment of Heart Failure. This Care Team includes your primary Heart Failure Specialized Cardiologist (physician), Advanced Practice Providers (APPs- Physician Assistants and Nurse Practitioners), and Pharmacist who all work together to provide you with the care you need, when you need it.   You may see any of the following providers on your designated Care Team at your next follow up:  Dr. Toribio Fuel Dr. Ezra Shuck Dr. Odis Brownie Greig Mosses, NP Caffie Shed, [place] [facility] [place], [place] Beckey Coe, NP Jordan Lee, NP Tinnie Redman, PharmD   Please be sure to bring in all your medications bottles to every appointment.   Need to Contact Us :  If you have any questions or concerns before your next appointment please send us  a message through [place] or call our office at [phone].    TO LEAVE A MESSAGE FOR THE NURSE SELECT OPTION 2, PLEASE LEAVE A MESSAGE INCLUDING: YOUR NAME DATE OF BIRTH CALL BACK NUMBER REASON FOR CALL**this is important as we prioritize the call backs  YOU WILL RECEIVE A CALL BACK THE SAME DAY AS LONG AS YOU CALL BEFORE 4:00 PM

## 2024-08-10 NOTE — Progress Notes (Addendum)
 HPI: FU coronary artery disease, CHF, atrial fibrillation and hypertension.  Patient is status post coronary artery bypass and graft in 2021. Admitted with CHF July 2024.  Also noted to be in atrial fibrillation.  ABIs July 2024 moderate on the right and left.  Mesenteric Dopplers July 2024 showed 70 to 99% superior mesenteric artery, occluded celiac artery and high-grade stenosis at the origin of the IMA.  Cardiac catheterization July 2024 showed occluded RCA, occluded circumflex, 60 followed by 70% LAD, 55% PDA and patent LIMA to the LAD, saphenous vein graft to the OM 3 and saphenous vein graft to the PDA; pulmonary capillary wedge pressure 35.  Renal Dopplers July 2024 showed 1 to 59% bilateral stenosis.  Transesophageal echocardiogram July 2024 showed ejection fraction 30 to 35%, mild left ventricular enlargement, moderate RV dysfunction, mild right ventricular enlargement, left atrial appendage status post surgical clipping, mild left atrial enlargement, mild right atrial enlargement, moderate to severe tricuspid regurgitation, mild mitral regurgitation.  Patient underwent cardioversion and was also diuresed with improvement.  Placed on amiodarone .  Beta-blocker was discontinued due to bradycardia.  Carotid Doppler June 2025 showed 60 to 79% right and 40 to 59% left stenosis.  Patient admitted November 2025 with atrial fibrillation with rapid ventricular response.  She was found to have GI bleed as well and colonoscopy showed bleeding ulcer treated with hemoclips.  She was treated with amiodarone  but this was stopped secondary to severe COPD.  Echocardiogram November 2025 showed ejection fraction 20 to 25% and severe RV dysfunction.  Abdominal CT November 2025 showed focal narrowing in the colon and possible colonoscopy recommended.  Since last seen she does have some dyspnea on exertion off of oxygen.  No orthopnea or PND but she has developed bilateral ankle edema.  She denies chest pain,  palpitations or syncope.  Current Outpatient Medications  Medication Sig Dispense Refill   albuterol  (VENTOLIN  HFA) 108 (90 Base) MCG/ACT inhaler Inhale 1 puff into the lungs 2 (two) times daily as needed for wheezing or shortness of breath.     apixaban  (ELIQUIS ) 5 MG TABS tablet Take 1 tablet (5 mg total) by mouth 2 (two) times daily. 180 tablet 1   cyclobenzaprine (FLEXERIL) 10 MG tablet Take 10 mg by mouth at bedtime.     diazepam (VALIUM) 5 MG tablet Take 5 mg by mouth 2 (two) times daily as needed for anxiety.     furosemide  (LASIX ) 40 MG tablet Take 1 tablet (40 mg total) by mouth daily. 30 tablet 0   metoprolol  succinate (TOPROL -XL) 50 MG 24 hr tablet Take 1 tablet (50 mg total) by mouth at bedtime. Take with or immediately following a meal. 90 tablet 0   OXYGEN Inhale 2 L into the lungs continuous.     pravastatin  (PRAVACHOL ) 20 MG tablet Take 1 tablet (20 mg total) by mouth daily at 6 PM. 90 tablet 3   umeclidinium-vilanterol (ANORO ELLIPTA ) 62.5-25 MCG/ACT AEPB Inhale 1 puff into the lungs daily. 60 each 0   No current facility-administered medications for this visit.     Past Medical History:  Diagnosis Date   A-fib [facility])    Carotid artery disease    COPD (chronic obstructive pulmonary disease) (HCC)    Coronary artery disease    Former tobacco use    Heart failure with mildly reduced ejection fraction (HFmrEF) (HCC)    Hx of CABG    Hypertension    PUD (peptic ulcer disease)     Past  Surgical History:  Procedure Laterality Date   ABDOMINAL HYSTERECTOMY     CARDIOVERSION N/A 03/20/2023   Procedure: CARDIOVERSION;  Surgeon: Rolan Ezra RAMAN, MD;  Location[facility] INVASIVE CV LAB;  Service: Cardiovascular;  Laterality: N/A;   CHOLECYSTECTOMY     COLONOSCOPY N/A 07/31/2024   Procedure: COLONOSCOPY;  Surgeon: Rollin Dover, MD;  Location: [facility] ENDOSCOPY;  Service: Gastroenterology;  Laterality: N/A;   CORONARY ARTERY BYPASS GRAFT     ESOPHAGOGASTRODUODENOSCOPY N/A 07/29/2024    Procedure: EGD (ESOPHAGOGASTRODUODENOSCOPY);  Surgeon: Leigh Elspeth SQUIBB, MD;  Location: [facility] ENDOSCOPY;  Service: Gastroenterology;  Laterality: N/A;   RIGHT/LEFT HEART CATH AND CORONARY/GRAFT ANGIOGRAPHY N/A 03/17/2023   Procedure: RIGHT/LEFT HEART CATH AND CORONARY/GRAFT ANGIOGRAPHY;  Surgeon: Anner Alm ORN, MD;  Location: [facility] INVASIVE CV LAB;  Service: Cardiovascular;  Laterality: N/A;   ruptured disc     SPINE SURGERY     TEE WITHOUT CARDIOVERSION N/A 03/20/2023   Procedure: TRANSESOPHAGEAL ECHOCARDIOGRAM;  Surgeon: Rolan Ezra RAMAN, MD;  Location: [facility] INVASIVE CV LAB;  Service: Cardiovascular;  Laterality: N/A;    Social History   Socioeconomic History   Marital status: Divorced    Spouse name: Not on file   Number of children: 2   Years of education: Not on file   Highest education level: 11th grade  Occupational History   Occupation: Retired  Tobacco Use   Smoking status: Former    Current packs/day: 0.00    Average packs/day: 1.5 packs/day for 54.9 years (82.3 ttl pk-yrs)    Types: Cigarettes    Start date: 09/16/1967    Quit date: 08/05/2022    Years since quitting: 2.0   Smokeless tobacco: Never  Vaping Use   Vaping status: Never Used  Substance and Sexual Activity   Alcohol use: Not Currently   Drug use: Never   Sexual activity: Not on file  Other Topics Concern   Not on file  Social History Narrative   Not on file   Social Drivers of Health   Financial Resource Strain: Low Risk  (07/29/2024)   Overall Financial Resource Strain (CARDIA)    Difficulty of Paying Living Expenses: Not hard at all  Food Insecurity: No Food Insecurity (07/27/2024)   Hunger Vital Sign    Worried About Running Out of Food in the Last Year: Never true    Ran Out of Food in the Last Year: Never true  Transportation Needs: No Transportation Needs (07/29/2024)   PRAPARE - Administrator, Civil Service (Medical): No    Lack of Transportation (Non-Medical): No  Physical  Activity: Unknown (12/10/2023)   Received from [facility]   Exercise Vital Sign    On average, how many days per week do you engage in moderate to strenuous exercise (like a brisk walk)?: 0 days    Minutes of Exercise per Session: Not on file  Stress: No Stress Concern Present (12/10/2023)   Received from [facility] of Occupational Health - Occupational Stress Questionnaire    Feeling of Stress : Only a little  Recent Concern: Stress - Stress Concern Present (11/06/2023)   Received from [facility] of Occupational Health - Occupational Stress Questionnaire    Feeling of Stress : Very much  Social Connections: Unknown (07/27/2024)   Social Connection and Isolation Panel    Frequency of Communication with Friends and Family: Three times a week    Frequency of Social Gatherings with Friends and Family: Three times  a week    Attends Religious Services: More than 4 times per year    Active Member of Clubs or Organizations: No    Attends Banker Meetings: Never    Marital Status: Patient declined  Intimate Partner Violence: Not At Risk (07/27/2024)   Humiliation, Afraid, Rape, and Kick questionnaire    Fear of Current or Ex-Partner: No    Emotionally Abused: No    Physically Abused: No    Sexually Abused: No    Family History  Problem Relation Age of Onset   Lupus Mother    Lung disease Neg Hx     ROS: no fevers or chills, productive cough, hemoptysis, dysphasia, odynophagia, melena, hematochezia, dysuria, hematuria, rash, seizure activity, orthopnea, PND, pedal edema, claudication. Remaining systems are negative.  Physical Exam: Well-developed well-nourished in no acute distress.  Skin is warm and dry.  HEENT is normal.  Neck is supple.  Chest with diminished breath sounds throughout Cardiovascular exam is irregular and tachycardic Abdominal exam nontender or distended. No masses palpated. Extremities show 1+ ankle  edema. neuro grossly intact   A/P  1 chronic systolic congestive heart failure/cardiomyopathy-patient has developed worsening lower extremity edema.  Increase Lasix  to 60 mg daily.  Check potassium and renal function.  Continue Lasix  at present dose.   Continue Toprol .  Not on Entresto  due to severity of renal insufficiency and recent hypotension.  2 persistent atrial fibrillation-amiodarone  was discontinued in the hospital due to severe COPD.  Her heart rate is elevated.  Increase Toprol  to 75 mg daily.  Options to help maintain sinus rhythm are limited from an antiarrhythmic standpoint (severe COPD makes amiodarone  less than optimal, history of coronary disease and therefore class Ic agent not an option and renal insufficiency limits Tikosyn).  May consider cardioversion when she has been on apixaban  continuously for 3 weeks though not clear she would hold sinus rhythm (she was in sinus rhythm on ECG April 2025).  Will have her seen back in 1 to 2 weeks to make sure that heart rate is improved.  3 coronary artery disease status post coronary bypass and graft-she denies chest pain.  Continue statin.  4 hypertension-patient's blood pressure is controlled today.  Continue present medical regimen.  5 hyperlipidemia-continue statin.  6 carotid artery disease-plan follow-up carotid Dopplers June 2026.  7 history of mesenteric artery stenosis/renal artery stenosis/peripheral vascular disease-continue statin.  8 severe home O2 dependent COPD  9 chronic stage III kidney disease  10 recent GI bleed-repeat hemoglobin.  Redell Shallow, MD

## 2024-08-23 ENCOUNTER — Encounter: Payer: Self-pay | Admitting: Cardiology

## 2024-08-23 ENCOUNTER — Ambulatory Visit: Attending: Cardiology | Admitting: Cardiology

## 2024-08-23 VITALS — BP 104/60 | HR 124 | Ht 66.0 in | Wt 176.0 lb

## 2024-08-23 DIAGNOSIS — I480 Paroxysmal atrial fibrillation: Secondary | ICD-10-CM

## 2024-08-23 DIAGNOSIS — I25810 Atherosclerosis of coronary artery bypass graft(s) without angina pectoris: Secondary | ICD-10-CM

## 2024-08-23 DIAGNOSIS — I5042 Chronic combined systolic (congestive) and diastolic (congestive) heart failure: Secondary | ICD-10-CM

## 2024-08-23 DIAGNOSIS — I5020 Unspecified systolic (congestive) heart failure: Secondary | ICD-10-CM

## 2024-08-23 MED ORDER — METOPROLOL SUCCINATE ER 50 MG PO TB24: 75.0000 mg | ORAL_TABLET | Freq: Every day | ORAL | Status: DC

## 2024-08-23 MED ORDER — FUROSEMIDE 40 MG PO TABS: 60.0000 mg | ORAL_TABLET | Freq: Every day | ORAL | Status: DC

## 2024-08-23 NOTE — Patient Instructions (Signed)
 Medication Instructions:   INCREASE METOPROLOL  TO 75 MG ONCE DAILY  INCREASE FUROSEMIDE  TO 60 MG ONCE DAILY  *If you need a refill on your cardiac medications before your next appointment, please call your pharmacy*   Follow-Up: At [facility], you and your health needs are our priority.  As part of our continuing mission to provide you with exceptional heart care, our providers are all part of one team.  This team includes your primary Cardiologist (physician) and Advanced Practice Providers or APPs (Physician Assistants and Nurse Practitioners) who all work together to provide you with the care you need, when you need it.  Your next appointment:   1 week(s)

## 2024-08-24 ENCOUNTER — Ambulatory Visit: Payer: Self-pay | Admitting: Cardiology

## 2024-08-24 LAB — CBC
Hematocrit: 30.6 % — ABNORMAL LOW (ref 34.0–46.6)
Hemoglobin: 9.5 g/dL — ABNORMAL LOW (ref 11.1–15.9)
MCH: 35.1 pg — ABNORMAL HIGH (ref 26.6–33.0)
MCHC: 31 g/dL — ABNORMAL LOW (ref 31.5–35.7)
MCV: 113 fL — ABNORMAL HIGH (ref 79–97)
Platelets: 193 x10E3/uL (ref 150–450)
RBC: 2.71 x10E6/uL — CL (ref 3.77–5.28)
RDW: 14.4 % (ref 11.7–15.4)
WBC: 6.3 x10E3/uL (ref 3.4–10.8)

## 2024-08-24 LAB — BASIC METABOLIC PANEL WITH GFR
BUN/Creatinine Ratio: 16 (ref 12–28)
BUN: 22 mg/dL (ref 8–27)
CO2: 27 mmol/L (ref 20–29)
Calcium: 9 mg/dL (ref 8.7–10.3)
Chloride: 96 mmol/L (ref 96–106)
Creatinine, Ser: 1.41 mg/dL — AB (ref 0.57–1.00)
Glucose: 94 mg/dL (ref 70–99)
Potassium: 3.8 mmol/L (ref 3.5–5.2)
Sodium: 136 mmol/L (ref 134–144)
eGFR: 40 mL/min/1.73 — AB (ref >59)

## 2024-08-24 NOTE — Progress Notes (Unsigned)
 Cardiology Office Note:    Date:  08/29/2024   ID:  Monica Zamora, DOB 05-20-1951, MRN [id number]  PCP:  [name], NP   [facility] Providers Cardiologist:  Redell Shallow, MD     Referring MD: [name], NP   Chief Complaint  Patient presents with   Follow-up    Acute HFrEF, Afib RVR, chronic respiratory failure, CKD    History of Present Illness:    Monica Zamora is a 73 y.o. female with a hx of chronic systolic and diastolic heart failure, PAF with recent GI bleed (07/2024), CAD s/p CABG in 2021 with LAA, hypertension, PAD, COPD, CKD 3 A, COPD with chronic respiratory failure on home O2.    She was diagnosed with Afib during a hospitalization for CHF in 03/2023. She has PAD with 70-99% SMA stenosis and occluded celiac artery and high grade stenosis at the origin of the IMA.  Last heart catheterization 03/2023 showed occluded RCA, occluded LCx, stenosis in the LAD but patent LIMA-LAD, SVG-OM 3, SVG-PDA. TEE 03/2023 with LVEF 30-35%, moderate RV dysfunction, mild LAE. DCCV to SR, moderate to severe TR, mild MR. BB discontinued due to bradycardia. Pt started on amiodarone .   She had Afib RVR during admission 07/2024, amiodarone  was discontinued due to severe COPD. She also had GI bleed and OAC was temporarily held. Echo with LVEF 20-25% with severe RV dysfunction. She was diurese, but GDMT for CHF limited by renal function.   No SGLT2i due to recurrent UTI. Spironolactone  previously stopped due to hyperkalemia (K 6.3). ACEI/ARB/ARNI discontinued due to rising creatinine.   She was seen by Dr. Shallow 08/23/24 and HR was elevated, still in AFib. He opted to increase toprol  to 75 mg nightly and increased lasix  to 60 mg daily for volume overload. She was scheduled for close follow up one week later and presents back today to follow up heart rate and consider heart monitor.   She is here with her family friend/caretaker. She remains in RVR with  worsening LE edema. Although heart rate improved, she remains weak and can only walk from room to room in her home. LE is worse compared to last week with L > R pitting edema. She remains on 2 L O2 24/7 now, prior to her hospitalization O2 was PRN. She feels she has worsened  clinically despite 60 mg lasix  daily and increased toprol . Rate improved, but remains elevated.   She missed one dose of eliquis  the week prior to hospitalization.  Discharged on 08/02/24. With further questioning, she missed one dose of eliquis  at home 2 weeks ago.   She also reports declining urination - she feels she needs to urinate but can't - hx of UTI and CKD.   Past Medical History:  Diagnosis Date   A-fib [facility])    Carotid artery disease    COPD (chronic obstructive pulmonary disease) (HCC)    Coronary artery disease    Former tobacco use    Heart failure with mildly reduced ejection fraction (HFmrEF) (HCC)    Hx of CABG    Hypertension    PUD (peptic ulcer disease)     Past Surgical History:  Procedure Laterality Date   ABDOMINAL HYSTERECTOMY     CARDIOVERSION N/A 03/20/2023   Procedure: CARDIOVERSION;  Surgeon: Rolan Ezra RAMAN, MD;  Location: [facility] INVASIVE CV LAB;  Service: Cardiovascular;  Laterality: N/A;   CHOLECYSTECTOMY     COLONOSCOPY N/A 07/31/2024   Procedure: COLONOSCOPY;  Surgeon: Rollin,  Belvie, MD;  Location[facility] ENDOSCOPY;  Service: Gastroenterology;  Laterality: N/A;   CORONARY ARTERY BYPASS GRAFT     ESOPHAGOGASTRODUODENOSCOPY N/A 07/29/2024   Procedure: EGD (ESOPHAGOGASTRODUODENOSCOPY);  Surgeon: Leigh Elspeth SQUIBB, MD;  Location: [facility] ENDOSCOPY;  Service: Gastroenterology;  Laterality: N/A;   RIGHT/LEFT HEART CATH AND CORONARY/GRAFT ANGIOGRAPHY N/A 03/17/2023   Procedure: RIGHT/LEFT HEART CATH AND CORONARY/GRAFT ANGIOGRAPHY;  Surgeon: Anner Alm ORN, MD;  Location: [facility] INVASIVE CV LAB;  Service: Cardiovascular;  Laterality: N/A;   ruptured disc     SPINE SURGERY     TEE WITHOUT  CARDIOVERSION N/A 03/20/2023   Procedure: TRANSESOPHAGEAL ECHOCARDIOGRAM;  Surgeon: Rolan Ezra RAMAN, MD;  Location: [facility] INVASIVE CV LAB;  Service: Cardiovascular;  Laterality: N/A;    Current Medications: Current Meds  Medication Sig   albuterol  (VENTOLIN  HFA) 108 (90 Base) MCG/ACT inhaler Inhale 1 puff into the lungs 2 (two) times daily as needed for wheezing or shortness of breath.   apixaban  (ELIQUIS ) 5 MG TABS tablet Take 1 tablet (5 mg total) by mouth 2 (two) times daily.   cyclobenzaprine (FLEXERIL) 10 MG tablet Take 10 mg by mouth at bedtime.   diazepam  (VALIUM ) 5 MG tablet Take 5 mg by mouth 2 (two) times daily as needed for anxiety.   furosemide  (LASIX ) 40 MG tablet Take 1.5 tablets (60 mg total) by mouth daily.   metoprolol  succinate (TOPROL -XL) 50 MG 24 hr tablet Take 1.5 tablets (75 mg total) by mouth at bedtime. Take with or immediately following a meal.   OXYGEN Inhale 2 L into the lungs continuous.   pravastatin  (PRAVACHOL ) 20 MG tablet Take 1 tablet (20 mg total) by mouth daily at 6 PM.   umeclidinium-vilanterol (ANORO ELLIPTA ) 62.5-25 MCG/ACT AEPB Inhale 1 puff into the lungs daily.     Allergies:   Patient has no known allergies.   Social History   Socioeconomic History   Marital status: Divorced    Spouse name: Not on file   Number of children: 2   Years of education: Not on file   Highest education level: 11th grade  Occupational History   Occupation: Retired  Tobacco Use   Smoking status: Former    Current packs/day: 0.00    Average packs/day: 1.5 packs/day for 54.9 years (82.3 ttl pk-yrs)    Types: Cigarettes    Start date: 09/16/1967    Quit date: 08/05/2022    Years since quitting: 2.0   Smokeless tobacco: Never  Vaping Use   Vaping status: Never Used  Substance and Sexual Activity   Alcohol use: Not Currently   Drug use: Never   Sexual activity: Not on file  Other Topics Concern   Not on file  Social History Narrative   Not on file   Social  Drivers of Health   Tobacco Use: Medium Risk (08/29/2024)   Patient History    Smoking Tobacco Use: Former    Smokeless Tobacco Use: Never    Passive Exposure: Not on Actuary Strain: Low Risk (07/29/2024)   Overall Financial Resource Strain (CARDIA)    Difficulty of Paying Living Expenses: Not hard at all  Food Insecurity: No Food Insecurity (07/27/2024)   Epic    Worried About Radiation Protection Practitioner of Food in the Last Year: Never true    Ran Out of Food in the Last Year: Never true  Transportation Needs: No Transportation Needs (07/29/2024)   Epic    Lack of Transportation (Medical): No    Lack of Transportation (  Non-Medical): No  Physical Activity: Unknown (12/10/2023)   Received from [facility]   Exercise Vital Sign    On average, how many days per week do you engage in moderate to strenuous exercise (like a brisk walk)?: 0 days    Minutes of Exercise per Session: Not on file  Stress: No Stress Concern Present (12/10/2023)   Received from [facility] - Occupational Stress Questionnaire    Feeling of Stress : Only a little  Recent Concern: Stress - Stress Concern Present (11/06/2023)   Received from [facility] of Occupational Health - Occupational Stress Questionnaire    Feeling of Stress : Very much  Social Connections: Unknown (07/27/2024)   Social Connection and Isolation Panel    Frequency of Communication with Friends and Family: Three times a week    Frequency of Social Gatherings with Friends and Family: Three times a week    Attends Religious Services: More than 4 times per year    Active Member of Clubs or Organizations: No    Attends Banker Meetings: Never    Marital Status: Patient declined  Depression (PHQ2-9): Not on file  Alcohol Screen: Low Risk (07/29/2024)   Alcohol Screen    Last Alcohol Screening Score (AUDIT): 0  Housing: Unknown (07/29/2024)   Epic    Unable to  Pay for Housing in the Last Year: No    Number of Times Moved in the Last Year: Not on file    Homeless in the Last Year: No  Utilities: Not At Risk (07/27/2024)   Epic    Threatened with loss of utilities: No  Health Literacy: Not on file     Family History: The patient's family history includes Lupus in her mother. There is no history of Lung disease.  ROS:   Please see the history of present illness.     All other systems reviewed and are negative.  EKGs/Labs/Other Studies Reviewed:    The following studies were reviewed today:  EKG Interpretation Date/Time:  Monday August 29 2024 13:52:17 EST Ventricular Rate:  114 PR Interval:    QRS Duration:  90 QT Interval:  336 QTC Calculation: 463 R Axis:   25  Text Interpretation: Atrial fibrillation with rapid ventricular response Cannot rule out Anterior infarct , age undetermined When compared with ECG of 27-Jul-2024 09:33, PREVIOUS ECG IS PRESENT Confirmed by Madie Slough ([id number]) on 08/29/2024 2:03:02 PM    Recent Labs: 07/27/2024: B Natriuretic Peptide 1,340.4; TSH 2.524 07/29/2024: ALT 10 08/02/2024: Magnesium  2.2 08/23/2024: BUN 22; Creatinine, Ser 1.41; Hemoglobin 9.5; Platelets 193; Potassium 3.8; Sodium 136  Recent Lipid Panel    Component Value Date/Time   CHOL 98 07/29/2024 0226   TRIG 82 07/29/2024 0226   HDL 42 07/29/2024 0226   CHOLHDL 2.3 07/29/2024 0226   VLDL 16 07/29/2024 0226   LDLCALC 40 07/29/2024 0226     Risk Assessment/Calculations:    CHA2DS2-VASc Score = 5   This indicates a 7.2% annual risk of stroke. The patient's score is based upon: CHF History: 1 HTN History: 1 Diabetes History: 0 Stroke History: 0 Vascular Disease History: 1 Age Score: 1 Gender Score: 1             Physical Exam:    VS:  BP 118/74 (BP Location: Left Arm, Patient Position: Sitting, Cuff Size: Normal)   Pulse (!) 114   Ht 5' 6 (1.676 m)   Wt 175  lb (79.4 kg)   SpO2 98%   BMI 28.25 kg/m     Wt  Readings from Last 3 Encounters:  08/29/24 175 lb (79.4 kg)  08/23/24 176 lb (79.8 kg)  08/05/24 179 lb (81.2 kg)     GEN: obese female in NAD, O2 in place HEENT: Normal NECK: No JVD - exam difficult LYMPHATICS: No lymphadenopathy CARDIAC: irregular rhythm tachycardic rate RESPIRATORY:  crackles in bases bilaterally  ABDOMEN: Soft, non-tender, non-distended MUSCULOSKELETAL:  2-3+ pitting edema with L > R SKIN: Warm and dry NEUROLOGIC:  Alert and oriented x 3 PSYCHIATRIC:  Normal affect   ASSESSMENT:    1. Paroxysmal atrial fibrillation (HCC)   2. Atrial fibrillation with RVR (HCC)   3. Chronic anticoagulation   4. Acute on chronic systolic heart failure (HCC)   5. RVF (right ventricular failure) (HCC)   6. Coronary artery disease involving native coronary artery of native heart without angina pectoris   7. Hx of CABG    PLAN:    In order of problems listed above:  Afib with RVR Persistent Afib - amiodarone  D/C'ed due to COPD and chronic respiratory failure - hx of CAD and renal function preclude many antiarrhythmic options - recently increased toprol  to 75 mg nightly - remains RVR - DCCV was considered, but felt may not maintain SR without additional medications - EKG today with Afib with RVR at rate of 114 - she feels weak and continues to have worsening volume overload   Acute on chronic systolic heart failure RV failure - last echo with LVEF 20-25% with severely reduced RV systolic function - no spironolactone  due to hx of hyperkalemia - no SGLT2i due to recurrent UTI - no ACEI/ARB/ARNI discontinued due to rising creatinine - has been on 75 mg toprol  and 60 mg lasix  daily   Chronic anticoagulation - recent GI bleed - reports no further issues with bleeding on eliquis  - did miss one dose 1.5 weeks ago   CAD s/p CABG in 2021, recent cath with patent grafts - no chest pain, continue statin   Disposition Difficult clinical situation.  Despite escalating  doses of beta-blocker, her heart rate is not controlled in A-fib.  She continues to decline clinically and reports feeling weak.  She can only walk from room to room.  I am concerned that higher doses of beta-blocker may worsen her pulmonary status.  She remains on 2 L O2 [place].  She has not increased her oxygen rates.  Despite increasing doses of p.o. Lasix , she continues to retain fluid and reports her lower extremity swelling is worse.  In addition, she reports inability to urinate.  She feels that she needs to use the bathroom but is unable to void.  Given her history, I am concerned about acute on chronic kidney injury versus UTI versus retention.  She has failed outpatient efforts for diuresis and rate control.  I have recommended ER evaluation and admission to medicine service.  The inpatient cardiology team is aware.  Depending on labs including BMP, will likely need IV dose of Lasix .  I would obtain bladder scan to evaluate urinary retention and need for indwelling Foley catheter versus pure wick.  May also need to rule out UTI given her history of recurrent UTI.   Please notify the cardiology service of her arrival and need for consult.       Medication Adjustments/Labs and Tests Ordered: Current medicines are reviewed at length with the patient today.  Concerns regarding medicines are outlined  above.  Orders Placed This Encounter  Procedures   EKG 12-Lead   No orders of the defined types were placed in this encounter.   Patient Instructions  Medication Instructions:  Your physician recommends that you continue on your current medications as directed. Please refer to the Current Medication list given to you today.  *If you need a refill on your cardiac medications before your next appointment, please call your pharmacy*  Lab Work: NONE ordered at this time of appointment   Testing/Procedures: NONE ordered at this time of appointment   Follow-Up: At [facility], you and  your health needs are our priority.  As part of our continuing mission to provide you with exceptional heart care, our providers are all part of one team.  This team includes your primary Cardiologist (physician) and Advanced Practice Providers or APPs (Physician Assistants and Nurse Practitioners) who all work together to provide you with the care you need, when you need it.  Your next appointment:    Per ER instructions  We recommend signing up for the patient portal called MyChart.  Sign up information is provided on this After Visit Summary.  MyChart is used to connect with patients for Virtual Visits (Telemedicine).  Patients are able to view lab/test results, encounter notes, upcoming appointments, etc.  Non-urgent messages can be sent to your provider as well.   To learn more about what you can do with MyChart, go to [email or web address].   Other Instructions Proceed to ED           Signed, Jon Nat Hails, PA  08/29/2024 2:52 PM    [facility]

## 2024-08-25 NOTE — Telephone Encounter (Signed)
 Letter of results sent to pt

## 2024-08-26 ENCOUNTER — Inpatient Hospital Stay: Admission: RE | Admit: 2024-08-26 | Discharge: 2024-08-26 | Attending: Acute Care | Admitting: Acute Care

## 2024-08-26 DIAGNOSIS — Z122 Encounter for screening for malignant neoplasm of respiratory organs: Secondary | ICD-10-CM

## 2024-08-26 DIAGNOSIS — Z87891 Personal history of nicotine dependence: Secondary | ICD-10-CM

## 2024-08-29 ENCOUNTER — Ambulatory Visit: Admitting: Physician Assistant

## 2024-08-29 ENCOUNTER — Other Ambulatory Visit: Payer: Self-pay

## 2024-08-29 ENCOUNTER — Emergency Department (HOSPITAL_COMMUNITY)

## 2024-08-29 ENCOUNTER — Inpatient Hospital Stay (HOSPITAL_COMMUNITY)
Admission: EM | Admit: 2024-08-29 | Discharge: 2024-10-16 | DRG: 291 | Disposition: E | Attending: Internal Medicine | Admitting: Internal Medicine

## 2024-08-29 ENCOUNTER — Encounter: Payer: Self-pay | Admitting: Physician Assistant

## 2024-08-29 VITALS — BP 118/74 | HR 114 | Ht 66.0 in | Wt 175.0 lb

## 2024-08-29 DIAGNOSIS — I4891 Unspecified atrial fibrillation: Secondary | ICD-10-CM

## 2024-08-29 DIAGNOSIS — N1831 Chronic kidney disease, stage 3a: Secondary | ICD-10-CM | POA: Diagnosis present

## 2024-08-29 DIAGNOSIS — I10 Essential (primary) hypertension: Secondary | ICD-10-CM | POA: Diagnosis present

## 2024-08-29 DIAGNOSIS — Z9981 Dependence on supplemental oxygen: Secondary | ICD-10-CM

## 2024-08-29 DIAGNOSIS — I50810 Right heart failure, unspecified: Secondary | ICD-10-CM | POA: Diagnosis not present

## 2024-08-29 DIAGNOSIS — R600 Localized edema: Secondary | ICD-10-CM | POA: Insufficient documentation

## 2024-08-29 DIAGNOSIS — D539 Nutritional anemia, unspecified: Secondary | ICD-10-CM | POA: Diagnosis present

## 2024-08-29 DIAGNOSIS — I5042 Chronic combined systolic (congestive) and diastolic (congestive) heart failure: Secondary | ICD-10-CM

## 2024-08-29 DIAGNOSIS — Z515 Encounter for palliative care: Secondary | ICD-10-CM

## 2024-08-29 DIAGNOSIS — I5023 Acute on chronic systolic (congestive) heart failure: Secondary | ICD-10-CM

## 2024-08-29 DIAGNOSIS — E785 Hyperlipidemia, unspecified: Secondary | ICD-10-CM | POA: Diagnosis present

## 2024-08-29 DIAGNOSIS — Z7901 Long term (current) use of anticoagulants: Secondary | ICD-10-CM

## 2024-08-29 DIAGNOSIS — I110 Hypertensive heart disease with heart failure: Secondary | ICD-10-CM | POA: Diagnosis not present

## 2024-08-29 DIAGNOSIS — K7200 Acute and subacute hepatic failure without coma: Secondary | ICD-10-CM | POA: Diagnosis present

## 2024-08-29 DIAGNOSIS — Z79899 Other long term (current) drug therapy: Secondary | ICD-10-CM

## 2024-08-29 DIAGNOSIS — M7989 Other specified soft tissue disorders: Secondary | ICD-10-CM | POA: Diagnosis not present

## 2024-08-29 DIAGNOSIS — I5043 Acute on chronic combined systolic (congestive) and diastolic (congestive) heart failure: Secondary | ICD-10-CM | POA: Diagnosis present

## 2024-08-29 DIAGNOSIS — Z66 Do not resuscitate: Secondary | ICD-10-CM | POA: Diagnosis present

## 2024-08-29 DIAGNOSIS — F32A Depression, unspecified: Secondary | ICD-10-CM | POA: Diagnosis present

## 2024-08-29 DIAGNOSIS — I739 Peripheral vascular disease, unspecified: Secondary | ICD-10-CM | POA: Diagnosis present

## 2024-08-29 DIAGNOSIS — D631 Anemia in chronic kidney disease: Secondary | ICD-10-CM | POA: Diagnosis present

## 2024-08-29 DIAGNOSIS — I480 Paroxysmal atrial fibrillation: Secondary | ICD-10-CM | POA: Diagnosis present

## 2024-08-29 DIAGNOSIS — I4821 Permanent atrial fibrillation: Secondary | ICD-10-CM | POA: Diagnosis present

## 2024-08-29 DIAGNOSIS — F419 Anxiety disorder, unspecified: Secondary | ICD-10-CM | POA: Diagnosis not present

## 2024-08-29 DIAGNOSIS — J810 Acute pulmonary edema: Secondary | ICD-10-CM | POA: Diagnosis not present

## 2024-08-29 DIAGNOSIS — I2510 Atherosclerotic heart disease of native coronary artery without angina pectoris: Secondary | ICD-10-CM | POA: Diagnosis not present

## 2024-08-29 DIAGNOSIS — K5900 Constipation, unspecified: Secondary | ICD-10-CM | POA: Diagnosis present

## 2024-08-29 DIAGNOSIS — Z87891 Personal history of nicotine dependence: Secondary | ICD-10-CM | POA: Diagnosis not present

## 2024-08-29 DIAGNOSIS — K761 Chronic passive congestion of liver: Secondary | ICD-10-CM | POA: Diagnosis present

## 2024-08-29 DIAGNOSIS — I340 Nonrheumatic mitral (valve) insufficiency: Secondary | ICD-10-CM | POA: Diagnosis not present

## 2024-08-29 DIAGNOSIS — I130 Hypertensive heart and chronic kidney disease with heart failure and stage 1 through stage 4 chronic kidney disease, or unspecified chronic kidney disease: Principal | ICD-10-CM | POA: Diagnosis present

## 2024-08-29 DIAGNOSIS — E876 Hypokalemia: Secondary | ICD-10-CM | POA: Diagnosis not present

## 2024-08-29 DIAGNOSIS — I255 Ischemic cardiomyopathy: Secondary | ICD-10-CM | POA: Diagnosis present

## 2024-08-29 DIAGNOSIS — J9621 Acute and chronic respiratory failure with hypoxia: Secondary | ICD-10-CM | POA: Diagnosis present

## 2024-08-29 DIAGNOSIS — J9611 Chronic respiratory failure with hypoxia: Secondary | ICD-10-CM | POA: Diagnosis not present

## 2024-08-29 DIAGNOSIS — N1832 Chronic kidney disease, stage 3b: Secondary | ICD-10-CM | POA: Diagnosis present

## 2024-08-29 DIAGNOSIS — Z951 Presence of aortocoronary bypass graft: Secondary | ICD-10-CM | POA: Diagnosis not present

## 2024-08-29 DIAGNOSIS — Z555 Less than a high school diploma: Secondary | ICD-10-CM

## 2024-08-29 DIAGNOSIS — R338 Other retention of urine: Secondary | ICD-10-CM | POA: Insufficient documentation

## 2024-08-29 DIAGNOSIS — J449 Chronic obstructive pulmonary disease, unspecified: Secondary | ICD-10-CM | POA: Diagnosis present

## 2024-08-29 DIAGNOSIS — Z8711 Personal history of peptic ulcer disease: Secondary | ICD-10-CM

## 2024-08-29 DIAGNOSIS — R570 Cardiogenic shock: Secondary | ICD-10-CM | POA: Diagnosis present

## 2024-08-29 DIAGNOSIS — K551 Chronic vascular disorders of intestine: Secondary | ICD-10-CM | POA: Diagnosis present

## 2024-08-29 DIAGNOSIS — E871 Hypo-osmolality and hyponatremia: Secondary | ICD-10-CM | POA: Diagnosis present

## 2024-08-29 DIAGNOSIS — I5084 End stage heart failure: Secondary | ICD-10-CM | POA: Diagnosis present

## 2024-08-29 DIAGNOSIS — I071 Rheumatic tricuspid insufficiency: Secondary | ICD-10-CM | POA: Diagnosis present

## 2024-08-29 DIAGNOSIS — E872 Acidosis, unspecified: Secondary | ICD-10-CM | POA: Diagnosis present

## 2024-08-29 DIAGNOSIS — M25511 Pain in right shoulder: Secondary | ICD-10-CM | POA: Diagnosis present

## 2024-08-29 DIAGNOSIS — I4819 Other persistent atrial fibrillation: Secondary | ICD-10-CM

## 2024-08-29 DIAGNOSIS — Z87440 Personal history of urinary (tract) infections: Secondary | ICD-10-CM

## 2024-08-29 DIAGNOSIS — N179 Acute kidney failure, unspecified: Secondary | ICD-10-CM | POA: Diagnosis present

## 2024-08-29 DIAGNOSIS — R739 Hyperglycemia, unspecified: Secondary | ICD-10-CM | POA: Diagnosis present

## 2024-08-29 DIAGNOSIS — I5082 Biventricular heart failure: Secondary | ICD-10-CM | POA: Diagnosis present

## 2024-08-29 DIAGNOSIS — Z8269 Family history of other diseases of the musculoskeletal system and connective tissue: Secondary | ICD-10-CM

## 2024-08-29 DIAGNOSIS — R54 Age-related physical debility: Secondary | ICD-10-CM | POA: Diagnosis present

## 2024-08-29 DIAGNOSIS — R001 Bradycardia, unspecified: Secondary | ICD-10-CM | POA: Diagnosis present

## 2024-08-29 DIAGNOSIS — E875 Hyperkalemia: Secondary | ICD-10-CM | POA: Diagnosis not present

## 2024-08-29 DIAGNOSIS — Z90710 Acquired absence of both cervix and uterus: Secondary | ICD-10-CM

## 2024-08-29 DIAGNOSIS — J690 Pneumonitis due to inhalation of food and vomit: Secondary | ICD-10-CM | POA: Diagnosis not present

## 2024-08-29 DIAGNOSIS — Z7189 Other specified counseling: Secondary | ICD-10-CM

## 2024-08-29 DIAGNOSIS — N183 Chronic kidney disease, stage 3 unspecified: Secondary | ICD-10-CM | POA: Diagnosis not present

## 2024-08-29 LAB — CBC WITH DIFFERENTIAL/PLATELET
Abs Immature Granulocytes: 0.01 K/uL (ref 0.00–0.07)
Basophils Absolute: 0 K/uL (ref 0.0–0.1)
Basophils Relative: 1 %
Eosinophils Absolute: 0.1 K/uL (ref 0.0–0.5)
Eosinophils Relative: 3 %
HCT: 30.8 % — ABNORMAL LOW (ref 36.0–46.0)
Hemoglobin: 9.6 g/dL — ABNORMAL LOW (ref 12.0–15.0)
Immature Granulocytes: 0 %
Lymphocytes Relative: 23 %
Lymphs Abs: 1.2 K/uL (ref 0.7–4.0)
MCH: 35.6 pg — ABNORMAL HIGH (ref 26.0–34.0)
MCHC: 31.2 g/dL (ref 30.0–36.0)
MCV: 114.1 fL — ABNORMAL HIGH (ref 80.0–100.0)
Monocytes Absolute: 0.5 K/uL (ref 0.1–1.0)
Monocytes Relative: 9 %
Neutro Abs: 3.3 K/uL (ref 1.7–7.7)
Neutrophils Relative %: 64 %
Platelets: 195 K/uL (ref 150–400)
RBC: 2.7 MIL/uL — ABNORMAL LOW (ref 3.87–5.11)
RDW: 15.1 % (ref 11.5–15.5)
WBC: 5.1 K/uL (ref 4.0–10.5)
nRBC: 0 % (ref 0.0–0.2)

## 2024-08-29 LAB — COMPREHENSIVE METABOLIC PANEL WITH GFR
ALT: 14 U/L (ref 0–44)
AST: 36 U/L (ref 15–41)
Albumin: 3.3 g/dL — ABNORMAL LOW (ref 3.5–5.0)
Alkaline Phosphatase: 78 U/L (ref 38–126)
Anion gap: 12 (ref 5–15)
BUN: 27 mg/dL — ABNORMAL HIGH (ref 8–23)
CO2: 28 mmol/L (ref 22–32)
Calcium: 8.4 mg/dL — ABNORMAL LOW (ref 8.9–10.3)
Chloride: 96 mmol/L — ABNORMAL LOW (ref 98–111)
Creatinine, Ser: 1.71 mg/dL — ABNORMAL HIGH (ref 0.44–1.00)
GFR, Estimated: 31 mL/min — ABNORMAL LOW (ref >60)
Glucose, Bld: 95 mg/dL (ref 70–99)
Potassium: 4.4 mmol/L (ref 3.5–5.1)
Sodium: 136 mmol/L (ref 135–145)
Total Bilirubin: 1.5 mg/dL — ABNORMAL HIGH (ref 0.0–1.2)
Total Protein: 6 g/dL — ABNORMAL LOW (ref 6.5–8.1)

## 2024-08-29 LAB — URINALYSIS, W/ REFLEX TO CULTURE (INFECTION SUSPECTED)
Bilirubin Urine: NEGATIVE
Glucose, UA: NEGATIVE mg/dL
Hgb urine dipstick: NEGATIVE
Ketones, ur: NEGATIVE mg/dL
Nitrite: NEGATIVE
Protein, ur: NEGATIVE mg/dL
Specific Gravity, Urine: 1.005 (ref 1.005–1.030)
pH: 6 (ref 5.0–8.0)

## 2024-08-29 LAB — BRAIN NATRIURETIC PEPTIDE: B Natriuretic Peptide: 2397.9 pg/mL — ABNORMAL HIGH (ref 0.0–100.0)

## 2024-08-29 LAB — MAGNESIUM: Magnesium: 2.2 mg/dL (ref 1.7–2.4)

## 2024-08-29 LAB — TROPONIN I (HIGH SENSITIVITY)
Troponin I (High Sensitivity): 9 ng/L (ref <18)
Troponin I (High Sensitivity): 10 ng/L (ref <18)

## 2024-08-29 MED ORDER — DILTIAZEM HCL-DEXTROSE 125-5 MG/125ML-% IV SOLN (PREMIX)
5.0000 mg/h | INTRAVENOUS | Status: DC
  Filled 2024-08-29: qty 125

## 2024-08-29 MED ORDER — ADULT MULTIVITAMIN W/MINERALS CH
1.0000 | ORAL_TABLET | Freq: Every day | ORAL | Status: DC
  Administered 2024-08-30 – 2024-09-12 (×14): 1 via ORAL
  Filled 2024-08-29 (×13): qty 1

## 2024-08-29 MED ORDER — ACETAMINOPHEN 325 MG PO TABS
650.0000 mg | ORAL_TABLET | Freq: Four times a day (QID) | ORAL | Status: DC | PRN
  Administered 2024-08-30 – 2024-09-09 (×8): 650 mg via ORAL
  Filled 2024-08-29 (×8): qty 2

## 2024-08-29 MED ORDER — CHLORHEXIDINE GLUCONATE CLOTH 2 % EX PADS
6.0000 | MEDICATED_PAD | Freq: Every day | CUTANEOUS | Status: DC
  Administered 2024-08-31 – 2024-09-12 (×13): 6 via TOPICAL

## 2024-08-29 MED ORDER — PRAVASTATIN SODIUM 40 MG PO TABS
20.0000 mg | ORAL_TABLET | Freq: Every day | ORAL | Status: DC
  Administered 2024-08-30 – 2024-09-11 (×12): 20 mg via ORAL
  Filled 2024-08-29 (×2): qty 1
  Filled 2024-08-29 (×3): qty 2
  Filled 2024-08-29: qty 0.5
  Filled 2024-08-29 (×2): qty 1
  Filled 2024-08-29 (×2): qty 2
  Filled 2024-08-29: qty 1
  Filled 2024-08-29 (×2): qty 2

## 2024-08-29 MED ORDER — METOPROLOL SUCCINATE ER 25 MG PO TB24
100.0000 mg | ORAL_TABLET | Freq: Every day | ORAL | Status: DC
  Administered 2024-08-29 – 2024-08-31 (×3): 100 mg via ORAL
  Filled 2024-08-29 (×2): qty 1
  Filled 2024-08-29 (×2): qty 4

## 2024-08-29 MED ORDER — UMECLIDINIUM-VILANTEROL 62.5-25 MCG/ACT IN AEPB
1.0000 | INHALATION_SPRAY | Freq: Every day | RESPIRATORY_TRACT | Status: DC
  Administered 2024-08-30 – 2024-09-05 (×7): 1 via RESPIRATORY_TRACT
  Filled 2024-08-29: qty 14

## 2024-08-29 MED ORDER — ONDANSETRON HCL 4 MG PO TABS: 4.0000 mg | ORAL_TABLET | Freq: Four times a day (QID) | ORAL | Status: DC | PRN

## 2024-08-29 MED ORDER — FUROSEMIDE 10 MG/ML IJ SOLN
40.0000 mg | Freq: Once | INTRAMUSCULAR | Status: AC
  Administered 2024-08-29: 17:00:00 40 mg via INTRAVENOUS
  Filled 2024-08-29: qty 4

## 2024-08-29 MED ORDER — DIAZEPAM 5 MG PO TABS
5.0000 mg | ORAL_TABLET | Freq: Two times a day (BID) | ORAL | Status: DC | PRN
  Administered 2024-08-30 – 2024-09-12 (×8): 5 mg via ORAL
  Filled 2024-08-29 (×6): qty 1

## 2024-08-29 MED ORDER — IPRATROPIUM-ALBUTEROL 0.5-2.5 (3) MG/3ML IN SOLN
3.0000 mL | Freq: Four times a day (QID) | RESPIRATORY_TRACT | Status: DC | PRN
  Administered 2024-08-30: 18:00:00 3 mL via RESPIRATORY_TRACT
  Filled 2024-08-29 (×2): qty 3

## 2024-08-29 MED ORDER — SENNOSIDES-DOCUSATE SODIUM 8.6-50 MG PO TABS
1.0000 | ORAL_TABLET | Freq: Every evening | ORAL | Status: DC | PRN
  Administered 2024-09-07: 1 via ORAL
  Filled 2024-08-29: qty 1

## 2024-08-29 MED ORDER — ACETAMINOPHEN 650 MG RE SUPP: 650.0000 mg | Freq: Four times a day (QID) | RECTAL | Status: DC | PRN

## 2024-08-29 MED ORDER — FUROSEMIDE 10 MG/ML IJ SOLN
40.0000 mg | Freq: Two times a day (BID) | INTRAMUSCULAR | Status: DC
  Administered 2024-08-30: 09:00:00 40 mg via INTRAVENOUS
  Filled 2024-08-29: qty 4

## 2024-08-29 MED ORDER — FUROSEMIDE 10 MG/ML IJ SOLN: 60.0000 mg | Freq: Once | INTRAMUSCULAR | Status: DC

## 2024-08-29 MED ORDER — ONDANSETRON HCL 4 MG/2ML IJ SOLN
4.0000 mg | Freq: Four times a day (QID) | INTRAMUSCULAR | Status: DC | PRN
  Administered 2024-09-05: 4 mg via INTRAVENOUS
  Filled 2024-08-29: qty 2

## 2024-08-29 MED ORDER — APIXABAN 5 MG PO TABS
5.0000 mg | ORAL_TABLET | Freq: Two times a day (BID) | ORAL | Status: DC
  Administered 2024-08-29 – 2024-09-12 (×27): 5 mg via ORAL
  Filled 2024-08-29 (×26): qty 1

## 2024-08-29 MED ORDER — BISACODYL 5 MG PO TBEC
5.0000 mg | DELAYED_RELEASE_TABLET | Freq: Every day | ORAL | Status: DC | PRN
  Administered 2024-09-09: 5 mg via ORAL
  Filled 2024-08-29: qty 1

## 2024-08-29 NOTE — Progress Notes (Signed)
° °  Checked on patient after her arrival in the ED.  She is resting comfortably.  She did get 40 mg IV Lasix  and did not urinate.  I spoke with the nursing staff and in and out cath netted 900 cc.  I asked the nursing staff to message the hospital service to consider Foley given this.

## 2024-08-29 NOTE — ED Triage Notes (Signed)
 Patient from heart and vascular center for eval of afib rvr (90-140). Has had weakness and palpitations for several weeks.

## 2024-08-29 NOTE — ED Provider Notes (Signed)
 [place] EMERGENCY DEPARTMENT AT [facility] Provider Note   CSN: [id number] Arrival date & time: 08/29/24  1527     Patient presents with: Atrial Fibrillation   Monica Zamora is a 73 y.o. female.    Atrial Fibrillation Associated symptoms include shortness of breath.     73 year old female with medical history significant for atrial fibrillation on Eliquis , peptic ulcer disease, PAD, COPD, CKD, HTN, CAD, CABG, CHF (last EF significant reduced in November 2025 at 20 to 25%) presenting to the emergency department for worsening shortness of breath and extremity swelling.  The patient was sent from her cardiologist office for fluid overload.  She has had weakness and palpitations for the last several weeks.  She states that she did miss a dose of her Eliquis  within the last week.  Prior to Admission medications  Medication Sig Start Date End Date Taking? Authorizing Provider  albuterol  (VENTOLIN  HFA) 108 (90 Base) MCG/ACT inhaler Inhale 1 puff into the lungs 2 (two) times daily as needed for wheezing or shortness of breath. 07/10/21  Yes [provider]  apixaban  (ELIQUIS ) 5 MG TABS tablet Take 1 tablet (5 mg total) by mouth 2 (two) times daily. 08/12/23  Yes Pietro Redell RAMAN, MD  cyclobenzaprine (FLEXERIL) 10 MG tablet Take 10 mg by mouth at bedtime. 12/31/23  Yes [provider]  diazepam  (VALIUM ) 5 MG tablet Take 5 mg by mouth 2 (two) times daily as needed for anxiety. 11/06/23  Yes [provider]  furosemide  (LASIX ) 40 MG tablet Take 1.5 tablets (60 mg total) by mouth daily. 08/23/24  Yes Pietro Redell RAMAN, MD  metoprolol  succinate (TOPROL -XL) 50 MG 24 hr tablet Take 1.5 tablets (75 mg total) by mouth at bedtime. Take with or immediately following a meal. 08/23/24  Yes Crenshaw, Redell RAMAN, MD  Multiple Vitamin (MULTIVITAMIN WITH MINERALS) TABS tablet Take 1 tablet by mouth daily.   Yes [provider]  OXYGEN Inhale 2 L into the lungs  continuous.   Yes [provider]  pravastatin  (PRAVACHOL ) 20 MG tablet Take 1 tablet (20 mg total) by mouth daily at 6 PM. 11/30/23 02/03/25 Yes Meng, Hao, PA  umeclidinium-vilanterol (ANORO ELLIPTA ) 62.5-25 MCG/ACT AEPB Inhale 1 puff into the lungs daily. 08/02/24  Yes [name], DO    Allergies: Patient has no known allergies.    Review of Systems  Respiratory:  Positive for shortness of breath.   All other systems reviewed and are negative.   Updated Vital Signs BP 116/88 (BP Location: Right Arm)   Pulse (!) 132   Temp 97.9 F (36.6 C) (Oral)   Resp (!) 24   SpO2 100%   Physical Exam Vitals and nursing note reviewed.  Constitutional:      General: She is not in acute distress.    Appearance: She is well-developed.  HENT:     Head: Normocephalic and atraumatic.  Eyes:     Conjunctiva/sclera: Conjunctivae normal.  Cardiovascular:     Rate and Rhythm: Normal rate and regular rhythm.     Heart sounds: No murmur heard. Pulmonary:     Effort: Pulmonary effort is normal. No respiratory distress.     Breath sounds: Examination of the right-lower field reveals decreased breath sounds. Examination of the left-lower field reveals decreased breath sounds and rhonchi. Decreased breath sounds, rhonchi and rales present.     Comments: On home 2L O2 via nasal cannula Abdominal:     Palpations: Abdomen is soft.  Tenderness: There is no abdominal tenderness.  Musculoskeletal:        General: No swelling.     Cervical back: Neck supple.  Skin:    General: Skin is warm and dry.     Capillary Refill: Capillary refill takes less than 2 seconds.  Neurological:     Mental Status: She is alert.  Psychiatric:        Mood and Affect: Mood normal.     (all labs ordered are listed, but only abnormal results are displayed) Labs Reviewed  COMPREHENSIVE METABOLIC PANEL WITH GFR - Abnormal; Notable for the following components:      Result Value   Chloride 96 (*)    BUN  27 (*)    Creatinine, Ser 1.71 (*)    Calcium  8.4 (*)    Total Protein 6.0 (*)    Albumin 3.3 (*)    Total Bilirubin 1.5 (*)    GFR, Estimated 31 (*)    All other components within normal limits  CBC WITH DIFFERENTIAL/PLATELET - Abnormal; Notable for the following components:   RBC 2.70 (*)    Hemoglobin 9.6 (*)    HCT 30.8 (*)    MCV 114.1 (*)    MCH 35.6 (*)    All other components within normal limits  BRAIN NATRIURETIC PEPTIDE - Abnormal; Notable for the following components:   B Natriuretic Peptide 2,397.9 (*)    All other components within normal limits  URINALYSIS, W/ REFLEX TO CULTURE (INFECTION SUSPECTED) - Abnormal; Notable for the following components:   APPearance HAZY (*)    Leukocytes,Ua LARGE (*)    Bacteria, UA MANY (*)    All other components within normal limits  MAGNESIUM   BASIC METABOLIC PANEL WITH GFR  CBC  TROPONIN I (HIGH SENSITIVITY)  TROPONIN I (HIGH SENSITIVITY)    EKG: EKG Interpretation Date/Time:  Monday August 29 2024 15:58:46 EST Ventricular Rate:  125 PR Interval:    QRS Duration:  86 QT Interval:  312 QTC Calculation: 450 R Axis:   54  Text Interpretation: Atrial fibrillation with rapid ventricular response Low voltage QRS Cannot rule out Anterior infarct , age undetermined ST & T wave abnormality, consider inferior ischemia Abnormal ECG When compared with ECG of 29-Aug-2024 13:52, PREVIOUS ECG IS PRESENT Confirmed by Jerrol Agent ([id number]) on 08/29/2024 4:57:42 PM  Radiology: [facility] Chest Portable 1 View Result Date: 08/29/2024 EXAM: 1 VIEW(S) XRAY OF THE CHEST 08/29/2024 04:36:00 PM COMPARISON: CT chest dated 08/26/2024 CLINICAL HISTORY: CHF FINDINGS: LUNGS AND PLEURA: Small bilateral pleural effusions, new. Mild bibasilar opacities, likely atelectasis. No frank interstitial edema. No pneumothorax. HEART AND MEDIASTINUM: Stable cardiomegaly. Thoracic atherosclerosis. BONES AND SOFT TISSUES: Median sternotomy. No acute osseous abnormality.  IMPRESSION: 1. Stable cardiomegaly. 2. New small bilateral pleural effusions. 3. No frank interstitial edema. Electronically signed by: Pinkie Pebbles MD 08/29/2024 05:19 PM EST RP Workstation: [id number]     Procedures   Medications Ordered in the ED  acetaminophen  (TYLENOL ) tablet 650 mg (has no administration in time range)    Or  acetaminophen  (TYLENOL ) suppository 650 mg (has no administration in time range)  senna-docusate (Senokot-S) tablet 1 tablet (has no administration in time range)  bisacodyl  (DULCOLAX) EC tablet 5 mg (has no administration in time range)  ondansetron  (ZOFRAN ) tablet 4 mg (has no administration in time range)    Or  ondansetron  (ZOFRAN ) injection 4 mg (has no administration in time range)  Chlorhexidine  Gluconate Cloth 2 % PADS 6 each (has no administration in  time range)  metoprolol  succinate (TOPROL -XL) 24 hr tablet 100 mg (100 mg Oral Given 08/29/24 2214)  pravastatin  (PRAVACHOL ) tablet 20 mg (has no administration in time range)  diazepam  (VALIUM ) tablet 5 mg (has no administration in time range)  apixaban  (ELIQUIS ) tablet 5 mg (5 mg Oral Given 08/29/24 2214)  multivitamin with minerals tablet 1 tablet (has no administration in time range)  umeclidinium-vilanterol (ANORO ELLIPTA ) 62.5-25 MCG/ACT 1 puff (has no administration in time range)  furosemide  (LASIX ) injection 40 mg (has no administration in time range)  ipratropium-albuterol  (DUONEB) 0.5-2.5 (3) MG/3ML nebulizer solution 3 mL (has no administration in time range)  furosemide  (LASIX ) injection 40 mg (40 mg Intravenous Given 08/29/24 1711)    Clinical Course as of 08/29/24 2234  Mon Aug 29, 2024  1848 B Natriuretic Peptide(!): 2,397.9 [JL]    Clinical Course User Index [JL] Jerrol Agent, MD                                 Medical Decision Making Amount and/or Complexity of Data Reviewed Labs:  Decision-making details documented in ED Course.  Risk Prescription drug  management. Decision regarding hospitalization.     73 year old female with medical history significant for atrial fibrillation on Eliquis , peptic ulcer disease, PAD, COPD, CKD, HTN, CAD, CABG, CHF (last EF significant reduced in November 2025 at 20 to 25%) presenting to the emergency department for worsening shortness of breath and extremity swelling.  The patient was sent from her cardiologist office for fluid overload.  She has had weakness and palpitations for the last several weeks.  She states that she did miss a dose of her Eliquis  within the last week.  On arrival, the patient was afebrile, tachycardic heart rate irregularly irregular, rate 129, saturating 100% on her home 2 L O2 via nasal cannula, BP 116/72.  Atrial fibrillation with RVR noted on cardiac telemetry and confirmed on EKG.  Patient sent from cardiology clinic due to concern for CHF exacerbation and uncontrolled atrial fibrillation.  He is anticoagulated, stated that in the past week she had missed 1 dose of her home anticoagulation.  IV access was obtained and the patient was administered IV Lasix  per cardiology recommendations.  The cardiology note was completed at time of patient ER arrival and recommendations had been to avoid amiodarone .  Cardiology consulted to notify the inpatient service that the patient had arrived to the emergency department.  The patient is without chest pain, endorses persistent shortness of breath in the setting of her atrial fibrillation.  Workup initiated to include EKG, chest x-ray and laboratory evaluation.  EKG: Atrial fibrillation, ventricular rate 125, nonspecific ST changes present  Chest x-ray: Stable cardiomegaly, new small bilateral pleural effusions with no frank interstitial edema  Labs: BNP elevated to 2398, urinalysis pending, cardiac troponin normal, magnesium  normal, CMP with creatinine at baseline at 1.71, no significant electrolyte abnormality, CBC without a leukocytosis, stable  anemia to 9.6.  Discussed with on-call cardiology plan for rate control.  Per cardiology notes, had planned to increase the patient's metoprolol  to 100 mg daily rather than administer amiodarone  given the patient's known severe COPD.  Hospitalist medicine consulted for admission, Dr. Lou accepting.      Final diagnoses:  Atrial fibrillation with RVR (HCC)  Acute on chronic systolic congestive heart failure [facility])    ED Discharge Orders     None          Blair Lundeen,  [place], MD 08/29/24 2234

## 2024-08-29 NOTE — Patient Instructions (Signed)
 Medication Instructions:  Your physician recommends that you continue on your current medications as directed. Please refer to the Current Medication list given to you today.  *If you need a refill on your cardiac medications before your next appointment, please call your pharmacy*  Lab Work: NONE ordered at this time of appointment   Testing/Procedures: NONE ordered at this time of appointment   Follow-Up: At [facility], you and your health needs are our priority.  As part of our continuing mission to provide you with exceptional heart care, our providers are all part of one team.  This team includes your primary Cardiologist (physician) and Advanced Practice Providers or APPs (Physician Assistants and Nurse Practitioners) who all work together to provide you with the care you need, when you need it.  Your next appointment:    Per ER instructions  We recommend signing up for the patient portal called MyChart.  Sign up information is provided on this After Visit Summary.  MyChart is used to connect with patients for Virtual Visits (Telemedicine).  Patients are able to view lab/test results, encounter notes, upcoming appointments, etc.  Non-urgent messages can be sent to your provider as well.   To learn more about what you can do with MyChart, go to [email or web address].   Other Instructions Proceed to ED

## 2024-08-29 NOTE — ED Notes (Signed)
 Patient was given lasix  and unable to urinate despite ambulating to and from the bathroom twice. I placed a foley and patients output was quite a bit. Admitting physician asked for input and output to be tracked. Patient is resting.

## 2024-08-29 NOTE — H&P (Addendum)
 History and Physical  DEAUN ROCHA FMW:[id number] DOB: Apr 13, 1951 DOA: 08/29/2024  PCP: [name], NP   Chief Complaint: Fatigue, leg swelling  HPI: Monica Zamora is a 73 y.o. female with medical history significant for persistent A-fib on Eliquis , HFrEF, CAD s/p CABG in 2021, carotid artery disease, COPD, chronic hypoxic respiratory failure on 2 L [place], CKD 3A, HLD, HTN, PVD and PUD who was sent by her outpatient cardiologist for evaluation of CHF exacerbation and A-fib with RVR.  Patient reports that over the last few weeks, she has felt quite fatigued and lightheaded when walking. She also endorses dyspnea on exertion, palpitations, urinary urgency and worsening lower extremity swelling but denies any abdominal pain, nausea, vomiting, fevers, chills, dysuria, bloody stools or hematuria.  Reports she resumed her Eliquis  prior to her recent hospital discharge and has been taking it consistently except missing 1 dose last week.  ED Course: Initial vitals show patient afebrile, RR 18-27, HR 110-130s, SBP 110-120s, SpO2 100% on 2 L. Initial labs significant for creatinine 1.71, BNP 2397, troponin 9, Hgb 9.6. EKG shows A-fib with RVR low voltage QRS. CXR shows cardiomegaly but no frank interstitial edema. Pt received IV Lasix  40 mg x 1.  Cardiology was consulted for evaluation. TRH was consulted for admission.   Review of Systems: Please see HPI for pertinent positives and negatives. A complete 10 system review of systems are otherwise negative.  Past Medical History:  Diagnosis Date   A-fib [facility])    Carotid artery disease    COPD (chronic obstructive pulmonary disease) (HCC)    Coronary artery disease    Former tobacco use    Heart failure with mildly reduced ejection fraction (HFmrEF) (HCC)    Hx of CABG    Hypertension    PUD (peptic ulcer disease)    Past Surgical History:  Procedure Laterality Date   ABDOMINAL HYSTERECTOMY     CARDIOVERSION N/A 03/20/2023    Procedure: CARDIOVERSION;  Surgeon: Rolan Ezra RAMAN, MD;  Location: [facility] INVASIVE CV LAB;  Service: Cardiovascular;  Laterality: N/A;   CHOLECYSTECTOMY     COLONOSCOPY N/A 07/31/2024   Procedure: COLONOSCOPY;  Surgeon: Rollin Dover, MD;  Location: [facility] ENDOSCOPY;  Service: Gastroenterology;  Laterality: N/A;   CORONARY ARTERY BYPASS GRAFT     ESOPHAGOGASTRODUODENOSCOPY N/A 07/29/2024   Procedure: EGD (ESOPHAGOGASTRODUODENOSCOPY);  Surgeon: Leigh Elspeth SQUIBB, MD;  Location: [facility] ENDOSCOPY;  Service: Gastroenterology;  Laterality: N/A;   RIGHT/LEFT HEART CATH AND CORONARY/GRAFT ANGIOGRAPHY N/A 03/17/2023   Procedure: RIGHT/LEFT HEART CATH AND CORONARY/GRAFT ANGIOGRAPHY;  Surgeon: Anner Alm ORN, MD;  Location: [facility] INVASIVE CV LAB;  Service: Cardiovascular;  Laterality: N/A;   ruptured disc     SPINE SURGERY     TEE WITHOUT CARDIOVERSION N/A 03/20/2023   Procedure: TRANSESOPHAGEAL ECHOCARDIOGRAM;  Surgeon: Rolan Ezra RAMAN, MD;  Location: [facility] INVASIVE CV LAB;  Service: Cardiovascular;  Laterality: N/A;   Social History:  reports that she quit smoking about 2 years ago. Her smoking use included cigarettes. She started smoking about 56 years ago. She has a 82.3 pack-year smoking history. She has never used smokeless tobacco. She reports that she does not currently use alcohol. She reports that she does not use drugs.  Allergies[1]  Family History  Problem Relation Age of Onset   Lupus Mother    Lung disease Neg Hx      Prior to Admission medications  Medication Sig Start Date End Date Taking? Authorizing Provider  albuterol  (VENTOLIN  HFA) 108 (90  Base) MCG/ACT inhaler Inhale 1 puff into the lungs 2 (two) times daily as needed for wheezing or shortness of breath. 07/10/21   [provider]  apixaban  (ELIQUIS ) 5 MG TABS tablet Take 1 tablet (5 mg total) by mouth 2 (two) times daily. 08/12/23   Pietro Redell RAMAN, MD  cyclobenzaprine (FLEXERIL) 10 MG tablet Take 10 mg by mouth at bedtime.  12/31/23   [provider]  diazepam  (VALIUM ) 5 MG tablet Take 5 mg by mouth 2 (two) times daily as needed for anxiety. 11/06/23   [provider]  furosemide  (LASIX ) 40 MG tablet Take 1.5 tablets (60 mg total) by mouth daily. 08/23/24   Pietro Redell RAMAN, MD  metoprolol  succinate (TOPROL -XL) 50 MG 24 hr tablet Take 1.5 tablets (75 mg total) by mouth at bedtime. Take with or immediately following a meal. 08/23/24   Crenshaw, Redell RAMAN, MD  OXYGEN Inhale 2 L into the lungs continuous.    [provider]  pravastatin  (PRAVACHOL ) 20 MG tablet Take 1 tablet (20 mg total) by mouth daily at 6 PM. 11/30/23 02/03/25  Meng, Hao, PA  umeclidinium-vilanterol (ANORO ELLIPTA ) 62.5-25 MCG/ACT AEPB Inhale 1 puff into the lungs daily. 08/02/24   [name], DO    Physical Exam: BP 120/83   Pulse (!) 134   Temp (!) 97.5 F (36.4 C) (Oral)   Resp (!) 22   SpO2 100%  General: Pleasant, weak appearing elderly woman laying in bed. No acute distress. HEENT: [place]/AT. Anicteric sclera CV: Tachycardic. Irregularly irregular rhythm. No murmurs, rubs, or gallops.  Pulmonary: On 2 L [place]. Lungs CTAB. Normal effort. No wheezing or rales. Abdominal: Soft, nontender, nondistended. Normal bowel sounds. Extremities: 2-3+ LLE pitting edema, 1+ RLE pitting edema.  Faint pedal pulses. Skin: Warm and dry. No obvious rash or lesions. Neuro: A&Ox3. Moves all extremities. Normal sensation to light touch. No focal deficit. Psych: Normal mood and affect          Labs on Admission:  Basic Metabolic Panel: Recent Labs  Lab 08/23/24 1544 08/29/24 1719  NA 136 136  K 3.8 4.4  CL 96 96*  CO2 27 28  GLUCOSE 94 95  BUN 22 27*  CREATININE 1.41* 1.71*  CALCIUM  9.0 8.4*  MG  --  2.2   Liver Function Tests: Recent Labs  Lab 08/29/24 1719  AST 36  ALT 14  ALKPHOS 78  BILITOT 1.5*  PROT 6.0*  ALBUMIN 3.3*   No results for input(s): LIPASE, AMYLASE in the last 168 hours. No results for  input(s): AMMONIA in the last 168 hours. CBC: Recent Labs  Lab 08/23/24 1544 08/29/24 1719  WBC 6.3 5.1  NEUTROABS  --  3.3  HGB 9.5* 9.6*  HCT 30.6* 30.8*  MCV 113* 114.1*  PLT 193 195   Cardiac Enzymes: No results for input(s): CKTOTAL, CKMB, CKMBINDEX, TROPONINI in the last 168 hours. BNP (last 3 results) Recent Labs    07/27/24 0930 08/29/24 1719  BNP 1,340.4* 2,397.9*    ProBNP (last 3 results) No results for input(s): PROBNP in the last 8760 hours.  CBG: No results for input(s): GLUCAP in the last 168 hours.  Radiological Exams on Admission: DG Chest Portable 1 View Result Date: 08/29/2024 EXAM: 1 VIEW(S) XRAY OF THE CHEST 08/29/2024 04:36:00 PM COMPARISON: CT chest dated 08/26/2024 CLINICAL HISTORY: CHF FINDINGS: LUNGS AND PLEURA: Small bilateral pleural effusions, new. Mild bibasilar opacities, likely atelectasis. No frank interstitial edema. No pneumothorax. HEART AND MEDIASTINUM: Stable cardiomegaly.  Thoracic atherosclerosis. BONES AND SOFT TISSUES: Median sternotomy. No acute osseous abnormality. IMPRESSION: 1. Stable cardiomegaly. 2. New small bilateral pleural effusions. 3. No frank interstitial edema. Electronically signed by: Pinkie Pebbles MD 08/29/2024 05:19 PM EST RP Workstation: [id number]   Assessment/Plan Monica Zamora is a 73 y.o. female with medical history significant for persistent A-fib on Eliquis , HFrEF, CAD s/p CABG in 2021, carotid artery disease, COPD, chronic hypoxic respiratory failure on 2 L [place], CKD 3A, HLD, HTN, PVD and PUD who was sent by her outpatient cardiologist for evaluation of CHF exacerbation and A-fib with RVR and admitted for further evaluation and management.  # Persistent A-fib with RVR - Patient with history of persistent A-fib presented with persistent fatigue and palpitations - Found to be in A-fib with RVR at outpatient cardiology office - Per their notes, patient not a candidate for amiodarone  due to O2  dependent COPD, Tikosyn contraindicated due to renal insufficiency and not a candidate for 1C agents due to CAD - Recommend increasing Toprol  XL from 75 mg to 100 mg daily for now with plan to consider cardioversion - Continue Eliquis  and Toprol  XL - Trend and replete electrolytes - Telemetry  # Acute on chronic systolic heart failure -- Last TTE on 07/27/2024 shows EF 20-25%, and LV global hypokinesis (while in A-fib) - Pt presented with dyspnea on exertion, fatigue and worsening lower extremity edema - Pt with clinical and laboratory signs of CHF exacerbation - Acute CHF likely 2/2 underdiuresing - Start IV lasix  40 mg twice daily - Continue Toprol  XL - Strict I&O, daily weights - Maintain K+ > 4.0, Mag > 2.0 - Telemetry  # CKD 3A - Slight rise in creatinine to 1.71 on admission, her creatinine has been variable over the last year however baseline seems to be around 1.2-1.5 - Continue IV diuresing as above - Trend renal function and avoid nephrotoxic agents  # Acute urinary retention - Patient reports that she has had the urge to urinate but has had minimal urine output even while on her Lasix  - Patient with difficulty voiding while in the ED, In-N-Out produced 900 cc of urine output - Place Foley catheter to assist with close monitoring of urine output while receiving IV diuresing  # Bilateral lower extremity edema - Patient with worsening lower extremity swelling, L>R, over the last week - Most likely due to CHF exacerbation however patient had a recent hospitalization and missed 1 dose of Eliquis  last week so will check LE doppler to rule out acute DVT - Follow-up LE DVT study - Apply TED hose  # HTN - BP stable with SBP in the 110-120s - Continue Toprol  XL - Closely monitor BP while receiving IV diuresis  # Macrocytic anemia # Hx of GI bleed - Had a recent 6-day hospitalization for GI bleed last month; EGD was negative, colonoscopy showed a few polyps, diverticulosis and  2 ulcers at the ileocecal valve. - Her Eliquis  was resumed at discharge - Hgb stable at 9.6 on admission, iron studies and vitamin B12 within normal limits during last hospitalization - Trend CBC and transfuse for hgb > 7  # CAD s/p CABG # HLD # PVD - Continue pravastatin   # COPD # Chronic hypoxic respiratory failure - Chronic and stable, remains on home baseline 2 L [place] - Continue home bronchodilator and supplemental O2 - As needed DuoNebs  # Anxiety - Continue as needed Valium   DVT prophylaxis: Eliquis     Code Status: Full Code  Consults called:  Cardiology  Family Communication: No family at bedside  Severity of Illness: The appropriate patient status for this patient is INPATIENT. Inpatient status is judged to be reasonable and necessary in order to provide the required intensity of service to ensure the patient's safety. The patient's presenting symptoms, physical exam findings, and initial radiographic and laboratory data in the context of their chronic comorbidities is felt to place them at high risk for further clinical deterioration. Furthermore, it is not anticipated that the patient will be medically stable for discharge from the hospital within 2 midnights of admission.   * I certify that at the point of admission it is my clinical judgment that the patient will require inpatient hospital care spanning beyond 2 midnights from the point of admission due to high intensity of service, high risk for further deterioration and high frequency of surveillance required.*  Level of care: Telemetry   I personally spent a total of 75 minutes in the care of the patient today including preparing to see the patient, getting/reviewing separately obtained history, performing a medically appropriate exam/evaluation, placing orders, and documenting clinical information in the EHR.   Lou Claretta HERO, MD 08/29/2024, 8:22 PM [facility] Pager: [phone] Isaiah 41:10   If  7PM-7AM, please contact night-coverage [email or web address] Password [email or web address]     [1] No Known Allergies

## 2024-08-29 NOTE — Consult Note (Signed)
 Cardiology Consultation   Patient ID: Monica Zamora MRN: [id number]; DOB: [birth year]  Admit date: 08/29/2024 Date of Consult: 08/29/2024  PCP:  [name], NP   [facility] Providers Cardiologist:  Redell Shallow, MD       Madie Jon Garre, [place]  Physician Assistant Cardiology   Progress Notes    Signed   Encounter Date: 08/29/2024    Signed     Expand All Collapse All   Cardiology Office Note:     Date:  08/29/2024    ID:  Monica Zamora, DOB 22-Feb-1951, MRN [id number]   PCP:  [name], NP              [facility] Providers Cardiologist:  Redell Shallow, MD      Referring MD: [name], NP        Chief Complaint  Patient presents with   Follow-up      Acute HFrEF, Afib RVR, chronic respiratory failure, CKD      History of Present Illness:     Monica Zamora is a 73 y.o. female with a hx of chronic systolic and diastolic heart failure, PAF with recent GI bleed (07/2024), CAD s/p CABG in 2021 with LAA, hypertension, PAD, COPD, CKD 3 A, COPD with chronic respiratory failure on home O2.     She was diagnosed with Afib during a hospitalization for CHF in 03/2023. She has PAD with 70-99% SMA stenosis and occluded celiac artery and high grade stenosis at the origin of the IMA.  Last heart catheterization 03/2023 showed occluded RCA, occluded LCx, stenosis in the LAD but patent LIMA-LAD, SVG-OM 3, SVG-PDA. TEE 03/2023 with LVEF 30-35%, moderate RV dysfunction, mild LAE. DCCV to SR, moderate to severe TR, mild MR. BB discontinued due to bradycardia. Pt started on amiodarone .    She had Afib RVR during admission 07/2024, amiodarone  was discontinued due to severe COPD. She also had GI bleed and OAC was temporarily held. Echo with LVEF 20-25% with severe RV dysfunction. She was diurese, but GDMT for CHF limited by renal function.    No SGLT2i due to recurrent UTI. Spironolactone  previously stopped due to  hyperkalemia (K 6.3). ACEI/ARB/ARNI discontinued due to rising creatinine.    She was seen by Dr. Shallow 08/23/24 and HR was elevated, still in AFib. He opted to increase toprol  to 75 mg nightly and increased lasix  to 60 mg daily for volume overload. She was scheduled for close follow up one week later and presents back today to follow up heart rate and consider heart monitor.    She is here with her family friend/caretaker. She remains in RVR with worsening LE edema. Although heart rate improved, she remains weak and can only walk from room to room in her home. LE is worse compared to last week with L > R pitting edema. She remains on 2 L O2 24/7 now, prior to her hospitalization O2 was PRN. She feels she has worsened  clinically despite 60 mg lasix  daily and increased toprol . Rate improved, but remains elevated.    She missed one dose of eliquis  the week prior to hospitalization.  Discharged on 08/02/24. With further questioning, she missed one dose of eliquis  at home 2 weeks ago.    She also reports declining urination - she feels she needs to urinate but can't - hx of UTI and CKD.        Past Medical History:  Diagnosis Date   A-fib (HCC)  Carotid artery disease     COPD (chronic obstructive pulmonary disease) (HCC)     Coronary artery disease     Former tobacco use     Heart failure with mildly reduced ejection fraction (HFmrEF) (HCC)     Hx of CABG     Hypertension     PUD (peptic ulcer disease)                 Past Surgical History:  Procedure Laterality Date   ABDOMINAL HYSTERECTOMY       CARDIOVERSION N/A 03/20/2023    Procedure: CARDIOVERSION;  Surgeon: Rolan Ezra RAMAN, MD;  Location: [facility] INVASIVE CV LAB;  Service: Cardiovascular;  Laterality: N/A;   CHOLECYSTECTOMY       COLONOSCOPY N/A 07/31/2024    Procedure: COLONOSCOPY;  Surgeon: Rollin Dover, MD;  Location: [facility] ENDOSCOPY;  Service: Gastroenterology;  Laterality: N/A;   CORONARY ARTERY BYPASS GRAFT        ESOPHAGOGASTRODUODENOSCOPY N/A 07/29/2024    Procedure: EGD (ESOPHAGOGASTRODUODENOSCOPY);  Surgeon: Leigh Elspeth SQUIBB, MD;  Location: [facility] ENDOSCOPY;  Service: Gastroenterology;  Laterality: N/A;   RIGHT/LEFT HEART CATH AND CORONARY/GRAFT ANGIOGRAPHY N/A 03/17/2023    Procedure: RIGHT/LEFT HEART CATH AND CORONARY/GRAFT ANGIOGRAPHY;  Surgeon: Anner Alm ORN, MD;  Location: [facility] INVASIVE CV LAB;  Service: Cardiovascular;  Laterality: N/A;   ruptured disc       SPINE SURGERY       TEE WITHOUT CARDIOVERSION N/A 03/20/2023    Procedure: TRANSESOPHAGEAL ECHOCARDIOGRAM;  Surgeon: Rolan Ezra RAMAN, MD;  Location: [facility] INVASIVE CV LAB;  Service: Cardiovascular;  Laterality: N/A;          Current Medications: Active Medications      Current Meds  Medication Sig   albuterol  (VENTOLIN  HFA) 108 (90 Base) MCG/ACT inhaler Inhale 1 puff into the lungs 2 (two) times daily as needed for wheezing or shortness of breath.   apixaban  (ELIQUIS ) 5 MG TABS tablet Take 1 tablet (5 mg total) by mouth 2 (two) times daily.   cyclobenzaprine (FLEXERIL) 10 MG tablet Take 10 mg by mouth at bedtime.   diazepam  (VALIUM ) 5 MG tablet Take 5 mg by mouth 2 (two) times daily as needed for anxiety.   furosemide  (LASIX ) 40 MG tablet Take 1.5 tablets (60 mg total) by mouth daily.   metoprolol  succinate (TOPROL -XL) 50 MG 24 hr tablet Take 1.5 tablets (75 mg total) by mouth at bedtime. Take with or immediately following a meal.   OXYGEN Inhale 2 L into the lungs continuous.   pravastatin  (PRAVACHOL ) 20 MG tablet Take 1 tablet (20 mg total) by mouth daily at 6 PM.   umeclidinium-vilanterol (ANORO ELLIPTA ) 62.5-25 MCG/ACT AEPB Inhale 1 puff into the lungs daily.        Allergies:   Patient has no known allergies.    Social History         Socioeconomic History   Marital status: Divorced      Spouse name: Not on file   Number of children: 2   Years of education: Not on file   Highest education level: 11th grade   Occupational History   Occupation: Retired  Tobacco Use   Smoking status: Former      Current packs/day: 0.00      Average packs/day: 1.5 packs/day for 54.9 years (82.3 ttl pk-yrs)      Types: Cigarettes      Start date: 09/16/1967      Quit date: 08/05/2022      Years  since quitting: 2.0   Smokeless tobacco: Never  Vaping Use   Vaping status: Never Used  Substance and Sexual Activity   Alcohol use: Not Currently   Drug use: Never   Sexual activity: Not on file  Other Topics Concern   Not on file  Social History Narrative   Not on file    Social Drivers of Health        Tobacco Use: Medium Risk (08/29/2024)    Patient History     Smoking Tobacco Use: Former     Smokeless Tobacco Use: Never     Passive Exposure: Not on file  Financial Resource Strain: Low Risk (07/29/2024)    Overall Financial Resource Strain (CARDIA)     Difficulty of Paying Living Expenses: Not hard at all  Food Insecurity: No Food Insecurity (07/27/2024)    Epic     Worried About Radiation Protection Practitioner of Food in the Last Year: Never true     Ran Out of Food in the Last Year: Never true  Transportation Needs: No Transportation Needs (07/29/2024)    Epic     Lack of Transportation (Medical): No     Lack of Transportation (Non-Medical): No  Physical Activity: Unknown (12/10/2023)    Received from [facility]    Exercise Vital Sign     On average, how many days per week do you engage in moderate to strenuous exercise (like a brisk walk)?: 0 days     Minutes of Exercise per Session: Not on file  Stress: No Stress Concern Present (12/10/2023)    Received from [facility] of Occupational [facility] - Occupational Stress Questionnaire     Feeling of Stress : Only a little  Recent Concern: Stress - Stress Concern Present (11/06/2023)    Received from [facility] of Occupational Health - Occupational Stress Questionnaire     Feeling of Stress : Very much  Social  Connections: Unknown (07/27/2024)    Social Connection and Isolation Panel     Frequency of Communication with Friends and Family: Three times a week     Frequency of Social Gatherings with Friends and Family: Three times a week     Attends Religious Services: More than 4 times per year     Active Member of Clubs or Organizations: No     Attends Banker Meetings: Never     Marital Status: Patient declined  Depression (PHQ2-9): Not on file  Alcohol Screen: Low Risk (07/29/2024)    Alcohol Screen     Last Alcohol Screening Score (AUDIT): 0  Housing: Unknown (07/29/2024)    Epic     Unable to Pay for Housing in the Last Year: No     Number of Times Moved in the Last Year: Not on file     Homeless in the Last Year: No  Utilities: Not At Risk (07/27/2024)    Epic     Threatened with loss of utilities: No  Health Literacy: Not on file      Family History: The patient's family history includes Lupus in her mother. There is no history of Lung disease.   ROS:   Please see the history of present illness.     All other systems reviewed and are negative.   EKGs/Labs/Other Studies Reviewed:     The following studies were reviewed today:   EKG Interpretation Date/Time:  Monday August 29 2024 13:52:17 EST Ventricular Rate:         114 PR Interval:                   QRS Duration:             90 QT Interval:                 336 QTC Calculation:463 R Axis:                         25   Text Interpretation:Atrial fibrillation with rapid ventricular response Cannot rule out Anterior infarct , age undetermined When compared with ECG of 27-Jul-2024 09:33, PREVIOUS ECG IS PRESENT Confirmed by Madie Slough ([id number]) on 08/29/2024 2:03:02 PM     Recent Labs: 07/27/2024: B Natriuretic Peptide 1,340.4; TSH 2.524 07/29/2024: ALT 10 08/02/2024: Magnesium  2.2 08/23/2024: BUN 22; Creatinine, Ser 1.41; Hemoglobin 9.5; Platelets 193; Potassium 3.8; Sodium 136  Recent Lipid  Panel Labs (Brief)          Component Value Date/Time    CHOL 98 07/29/2024 0226    TRIG 82 07/29/2024 0226    HDL 42 07/29/2024 0226    CHOLHDL 2.3 07/29/2024 0226    VLDL 16 07/29/2024 0226    LDLCALC 40 07/29/2024 0226          Risk Assessment/Calculations:     CHA2DS2-VASc Score = 5   This indicates a 7.2% annual risk of stroke. The patient's score is based upon: CHF History: 1 HTN History: 1 Diabetes History: 0 Stroke History: 0 Vascular Disease History: 1 Age Score: 1 Gender Score: 1               Physical Exam:     VS:  BP 118/74 (BP Location: Left Arm, Patient Position: Sitting, Cuff Size: Normal)   Pulse (!) 114   Ht 5' 6 (1.676 m)   Wt 175 lb (79.4 kg)   SpO2 98%   BMI 28.25 kg/m         Wt Readings from Last 3 Encounters:  08/29/24 175 lb (79.4 kg)  08/23/24 176 lb (79.8 kg)  08/05/24 179 lb (81.2 kg)      GEN: obese female in NAD, O2 in place HEENT: Normal NECK: No JVD - exam difficult LYMPHATICS: No lymphadenopathy CARDIAC: irregular rhythm tachycardic rate RESPIRATORY:  crackles in bases bilaterally  ABDOMEN: Soft, non-tender, non-distended MUSCULOSKELETAL:  2-3+ pitting edema with L > R SKIN: Warm and dry NEUROLOGIC:  Alert and oriented x 3 PSYCHIATRIC:  Normal affect    ASSESSMENT:     1. Paroxysmal atrial fibrillation (HCC)   2. Atrial fibrillation with RVR (HCC)   3. Chronic anticoagulation   4. Acute on chronic systolic heart failure (HCC)   5. RVF (right ventricular failure) (HCC)   6. Coronary artery disease involving native coronary artery of native heart without angina pectoris   7. Hx of CABG     PLAN:     In order of problems listed above:   Afib with RVR Persistent Afib - amiodarone  D/C'ed due to COPD and chronic respiratory failure - hx of CAD and renal function preclude many antiarrhythmic options - recently increased toprol  to 75 mg nightly - remains RVR - DCCV was considered, but felt may not maintain SR  without additional medications - EKG today with Afib with RVR at rate of 114 - she feels weak and continues to have worsening  volume overload     Acute on chronic systolic heart failure RV failure - last echo with LVEF 20-25% with severely reduced RV systolic function - no spironolactone  due to hx of hyperkalemia - no SGLT2i due to recurrent UTI - no ACEI/ARB/ARNI discontinued due to rising creatinine - has been on 75 mg toprol  and 60 mg lasix  daily     Chronic anticoagulation - recent GI bleed - reports no further issues with bleeding on eliquis  - did miss one dose 1.5 weeks ago     CAD s/p CABG in 2021, recent cath with patent grafts - no chest pain, continue statin     Disposition Difficult clinical situation.  Despite escalating doses of beta-blocker, her heart rate is not controlled in A-fib.  She continues to decline clinically and reports feeling weak.  She can only walk from room to room.  I am concerned that higher doses of beta-blocker may worsen her pulmonary status.  She remains on 2 L O2 [place].  She has not increased her oxygen rates.  Despite increasing doses of p.o. Lasix , she continues to retain fluid and reports her lower extremity swelling is worse.  In addition, she reports inability to urinate.  She feels that she needs to use the bathroom but is unable to void.  Given her history, I am concerned about acute on chronic kidney injury versus UTI versus retention.   She has failed outpatient efforts for diuresis and rate control.  I have recommended ER evaluation and admission to medicine service.  The inpatient cardiology team is aware.  Depending on labs including BMP, will likely need IV dose of Lasix .  I would obtain bladder scan to evaluate urinary retention and need for indwelling Foley catheter versus pure wick.  May also need to rule out UTI given her history of recurrent UTI.    Please notify the cardiology service of her arrival and need for consult.      Signed, Jon Nat Hails, PA  08/29/2024 2:52 PM    [facility]         As above, patient seen and examined.  Briefly she is a 73 year old female with past medical history of chronic combined systolic/diastolic congestive heart failure, persistent atrial fibrillation, recent GI bleed, coronary artery disease status post coronary bypass and graft, left atrial appendage clipping, hypertension, peripheral vascular disease, severe COPD home O2 dependent, chronic kidney disease with atrial fibrillation with rapid ventricular response and acute on chronic combined systolic/diastolic congestive heart failure.  Previously on amiodarone  but was discontinued in November due to severe COPD.  Also had GI bleed in November and oral anticoagulation was temporarily held.  Echocardiogram November 2025 showed ejection fraction 20 to 25%, severe RV dysfunction.  She was seen in the office 1 week ago and heart rate was elevated and Toprol  was increased.  Her Lasix  was also increased for worsening CHF.  She returned today and her bilateral lower extremity edema left greater than right was worse and she feels fatigued.  Her heart rate remains elevated.  She denies chest pain, palpitations, melena, hematochezia or palpitations.  Notes she missed 1 dose of Eliquis  last week.  Patient also notes declining urination.  Electrocardiogram shows atrial fibrillation with rapid ventricular spots, low voltage and prior septal infarct cannot be excluded.  1 persistent atrial fibrillation with rapid ventricular response-options for antiarrhythmics are limited.  Would like to avoid amiodarone  in the setting of severe home O2 dependent COPD.  She has renal insufficiency and  therefore Tikosyn likely not a good option.  Not a candidate for 1C agents due to coronary artery disease.  Will increase Toprol  to 100 mg daily.  Continue apixaban .  May could consider cardioversion but not clear she would hold sinus rhythm particularly in  the setting of severe COPD.  2 acute on chronic systolic congestive heart failure-she appears to be volume overloaded on examination.  Would treat with Lasix  40 mg IV twice daily and follow renal function closely.  She is not on spironolactone  given history of hyperkalemia.  She has had problems with recurrent UTIs and therefore not on SGLT2 inhibitor.  Also not on an ARB or ARNI due to borderline blood pressure and renal insufficiency.  3 coronary artery disease-continue statin.  She denies chest pain.  4 chronic stage III kidney disease-check renal function and follow closely with diuresis.  Would also check urinalysis to rule out UTI.  5 hyperlipidemia-continue statin.  6 severe home O2 dependent COPD  7 history of recent GI bleed-check hemoglobin.  Signed, Redell Shallow, MD  08/29/2024 4:02 PM

## 2024-08-30 ENCOUNTER — Encounter (HOSPITAL_COMMUNITY): Payer: Self-pay | Admitting: Student

## 2024-08-30 ENCOUNTER — Inpatient Hospital Stay (HOSPITAL_COMMUNITY)

## 2024-08-30 DIAGNOSIS — I4891 Unspecified atrial fibrillation: Secondary | ICD-10-CM | POA: Diagnosis not present

## 2024-08-30 LAB — BASIC METABOLIC PANEL WITH GFR
Anion gap: 10 (ref 5–15)
BUN: 28 mg/dL — ABNORMAL HIGH (ref 8–23)
CO2: 31 mmol/L (ref 22–32)
Calcium: 8.6 mg/dL — ABNORMAL LOW (ref 8.9–10.3)
Chloride: 94 mmol/L — ABNORMAL LOW (ref 98–111)
Creatinine, Ser: 1.81 mg/dL — ABNORMAL HIGH (ref 0.44–1.00)
GFR, Estimated: 29 mL/min — ABNORMAL LOW (ref >60)
Glucose, Bld: 98 mg/dL (ref 70–99)
Potassium: 3.1 mmol/L — ABNORMAL LOW (ref 3.5–5.1)
Sodium: 135 mmol/L (ref 135–145)

## 2024-08-30 LAB — CBC
HCT: 28.9 % — ABNORMAL LOW (ref 36.0–46.0)
Hemoglobin: 9.1 g/dL — ABNORMAL LOW (ref 12.0–15.0)
MCH: 35.7 pg — ABNORMAL HIGH (ref 26.0–34.0)
MCHC: 31.5 g/dL (ref 30.0–36.0)
MCV: 113.3 fL — ABNORMAL HIGH (ref 80.0–100.0)
Platelets: 178 K/uL (ref 150–400)
RBC: 2.55 MIL/uL — ABNORMAL LOW (ref 3.87–5.11)
RDW: 14.8 % (ref 11.5–15.5)
WBC: 4.4 K/uL (ref 4.0–10.5)
nRBC: 0 % (ref 0.0–0.2)

## 2024-08-30 MED ORDER — POTASSIUM CHLORIDE CRYS ER 20 MEQ PO TBCR
40.0000 meq | EXTENDED_RELEASE_TABLET | ORAL | Status: AC
  Administered 2024-08-30 (×2): 40 meq via ORAL
  Filled 2024-08-30 (×2): qty 2

## 2024-08-30 MED ORDER — FUROSEMIDE 10 MG/ML IJ SOLN
60.0000 mg | Freq: Two times a day (BID) | INTRAMUSCULAR | Status: DC
  Administered 2024-08-30: 18:00:00 60 mg via INTRAVENOUS
  Filled 2024-08-30 (×2): qty 6

## 2024-08-30 NOTE — Assessment & Plan Note (Signed)
 Continue statin therapy

## 2024-08-30 NOTE — Assessment & Plan Note (Signed)
 Continue blood pressure control and statin therapy  Not on antiplatelet therapy due to full anticoagulation with apixaban 

## 2024-08-30 NOTE — Progress Notes (Signed)
 Bilateral lower extremity venous duplex has been completed. Preliminary results can be found in CV Proc through chart review.   08/30/2024 9:20 AM Cathlyn Collet RVT

## 2024-08-30 NOTE — Assessment & Plan Note (Signed)
 Anxiety  Continue with diazepam  per her home regimen.

## 2024-08-30 NOTE — Assessment & Plan Note (Signed)
 Continue rate control with metoprolol  succinate and continue anticoagulation with apixaban 

## 2024-08-30 NOTE — ED Notes (Addendum)
 Rn called to bedside because pt said to secretary she wants to ama and rip out her catheter. Education provided.Provider Arrient MD aware via secure chat. Pt states she will wait for provider.

## 2024-08-30 NOTE — Assessment & Plan Note (Signed)
No signs of acute exacerbation, continue with bronchodilator therapy.

## 2024-08-30 NOTE — ED Notes (Signed)
 Pt found to have ripped off stat lock, when inquired as to when it happened pt stated it was from earlier pulling on catheter.

## 2024-08-30 NOTE — Progress Notes (Signed)
 Progress Note   Patient: Monica Zamora FMW:[id number] DOB: 06-Aug-1951 DOA: 08/29/2024     1 DOS: the patient was seen and examined on 08/30/2024   Brief hospital course: Monica Zamora was admitted to the hospital with the working diagnosis of atrial fibrillation with RVR.   73 yo female with the past medical history of atrial fibrillation, heart failure, coronary artery disease, hypertension, and CKD who presented with fatigue.  Reported dyspnea on exertion, palpitations, lower extremity edema and general fatigue.  Recent hospitalization 11/12 to 08/02/24 for atrial fibrillation, she was discharged on metoprolol  and anticoagulation with apixaban .  On the day of admission she was evaluated at the outpatient cardiology clinic, she was found in atrial fibrillation with RVR along with volume overloaded, she failed outpatient therapy, then she was referred to the ED.  On her initial physical examination her blood pressure was 120/83, HR 134, RR 22 and 02 saturation 100% on supplemental 02 per  Lungs with no wheezing, heart with S1 and S2 present, irregularly irregular with no gallops or rubs, abdomen with no distention and positive lower extremity edema.   Na 136, K 4,4 Cl 96 bicarbonate 28 glucose 95 bun 27 cr 1,71  BNP 2,397 High sensitive troponin 9 and 10  Wbc 5.1 hgb 9.6 plt 195  Urine analysis SG 1,005, protein negative, large leukocytes and negative hgb, 6-10 wbc and 0-5 rbc   Chest radiograph with right rotation, positive cardiomegaly, bilateral hilar vascular congestion and small bilateral pleural effusions, sternotomy wires in place.   EKG 114 bpm, normal axis, normal intervals, qtc 463, atrial fibrillation rhythm with no significant ST segment or T wave changes.   Patient was placed on IV furosemide  for diuresis   Assessment and Plan: * Acute on chronic systolic congestive heart failure (HCC) Echocardiogram with reduced LV systolic function EF 20 to 25%, global  hypokinesis, RV with severe reduction in systolic function, no pericardial effusion, no significant valvular disease.   Continue volume overloaded.   Plan to continue diuresis with furosemide , increase dose to 60 mg IV bid Continue blood pressure monitoring on metoprolol  succinate 100 mg po daily.  Limited medical therapy due to reduced GFR acutely   CAD (coronary artery disease) No chest pain, no acute coronary syndrome   Essential hypertension Continue blood pressure control with metoprolol  succinate   Paroxysmal atrial fibrillation (HCC) Continue rate control with metoprolol  succinate and continue anticoagulation with apixaban    CKD stage 3a, GFR 45-59 ml/min (HCC) AKI   Renal function with serum cr at 1,8 with K at 3,1 and serum bicarbonate at 31 Na 135   Plan to continue diuresis with furosemide  60 mg IV bid Add Kcl 40 meq x2 and follow up renal function and electrolytes in am.  Avoid hypotension and nephrotoxic medications   COPD (chronic obstructive pulmonary disease) (HCC) No signs of acute exacerbation, continue with bronchodilator therapy   PVD (peripheral vascular disease) Continue blood pressure control and statin therapy  Not on antiplatelet therapy due to full anticoagulation with apixaban   Hyperlipidemia Continue statin therapy   Depression Anxiety  Continue with diazepam  per her home regimen.       Subjective: Patient continue very weak and deconditioned, positive dyspnea and edema, no chest pain,   Physical Exam: Vitals:   08/30/24 0600 08/30/24 0740 08/30/24 0744 08/30/24 0900  BP: 109/67   106/75  Pulse: (!) 108   79  Resp: (!) 24   18  Temp:   97.6 F (36.4 C)  TempSrc:   Oral   SpO2: 100% 100%  100%   Neurology awake and alert, deconditioned ENT with mild pallor with no icterus Cardiovascular with S1 and S2 present, irregularly irregular with no gallops or rubs Respiratory with bilateral rales with no wheezing or rhonchi  Abdomen with  no distention, soft and non tender Positive lower extremity edema ++   Data Reviewed:    Family Communication: no family at the bedside   Disposition: Status is: Inpatient Remains inpatient appropriate because: IV diuresis   Planned Discharge Destination: Home     Author: Elidia Toribio Furnace, MD 08/30/2024 10:28 AM  For on call review [email or web address].

## 2024-08-30 NOTE — Assessment & Plan Note (Signed)
No chest pain, no acute coronary syndrome.  

## 2024-08-30 NOTE — Assessment & Plan Note (Signed)
 AKI   Renal function with serum cr at 1,8 with K at 3,1 and serum bicarbonate at 31 Na 135   Plan to continue diuresis with furosemide  60 mg IV bid Add Kcl 40 meq x2 and follow up renal function and electrolytes in am.  Avoid hypotension and nephrotoxic medications

## 2024-08-30 NOTE — Progress Notes (Signed)
 Rounding Note   Patient Name: Monica Zamora Date of Encounter: 08/30/2024  [facility] Cardiologist: Redell Shallow, MD   Subjective Mild dyspnea; no CP  Scheduled Meds:  apixaban   5 mg Oral BID   Chlorhexidine  Gluconate Cloth  6 each Topical Daily   furosemide   40 mg Intravenous BID   metoprolol  succinate  100 mg Oral QHS   multivitamin with minerals  1 tablet Oral Daily   pravastatin   20 mg Oral q1800   umeclidinium-vilanterol  1 puff Inhalation Daily   Continuous Infusions:  PRN Meds: acetaminophen  **OR** acetaminophen , bisacodyl , diazepam , ipratropium-albuterol , ondansetron  **OR** ondansetron  (ZOFRAN ) IV, senna-docusate   Vital Signs  Vitals:   08/30/24 0600 08/30/24 0740 08/30/24 0744 08/30/24 0900  BP: 109/67   106/75  Pulse: (!) 108   79  Resp: (!) 24   18  Temp:   97.6 F (36.4 C)   TempSrc:   Oral   SpO2: 100% 100%  100%   No intake or output data in the 24 hours ending 08/30/24 1031    08/29/2024    1:43 PM 08/23/2024    3:01 PM 08/05/2024    2:09 PM  Last 3 Weights  Weight (lbs) 175 lb 176 lb 179 lb  Weight (kg) 79.379 kg 79.833 kg 81.194 kg      Telemetry Atrial fibrillation rate mildly elevated - Personally Reviewed  Physical Exam  GEN: No acute distress.   Neck: supple Cardiac: irregular and mildly tachycardic  Respiratory: Diminished BS GI: Soft, NT/ND MS: 2+ ankle edema. Neuro:  Nonfocal  Psych: Normal affect   Labs High Sensitivity Troponin:   Recent Labs  Lab 08/29/24 1719 08/29/24 1831  TROPONINIHS 9 10   No results for input(s): TRNPT in the last 720 hours.     Chemistry Recent Labs  Lab 08/23/24 1544 08/29/24 1719 08/30/24 0417  NA 136 136 135  K 3.8 4.4 3.1*  CL 96 96* 94*  CO2 27 28 31   GLUCOSE 94 95 98  BUN 22 27* 28*  CREATININE 1.41* 1.71* 1.81*  CALCIUM  9.0 8.4* 8.6*  MG  --  2.2  --   PROT  --  6.0*  --   ALBUMIN  --  3.3*  --   AST  --  36  --   ALT  --  14  --   ALKPHOS  --  78   --   BILITOT  --  1.5*  --   GFRNONAA  --  31* 29*  ANIONGAP  --  12 10    Hematology Recent Labs  Lab 08/23/24 1544 08/29/24 1719 08/30/24 0417  WBC 6.3 5.1 4.4  RBC 2.71* 2.70* 2.55*  HGB 9.5* 9.6* 9.1*  HCT 30.6* 30.8* 28.9*  MCV 113* 114.1* 113.3*  MCH 35.1* 35.6* 35.7*  MCHC 31.0* 31.2 31.5  RDW 14.4 15.1 14.8  PLT 193 195 178    BNP Recent Labs  Lab 08/29/24 1719  BNP 2,397.9*      Radiology  VAS US  LOWER EXTREMITY VENOUS (DVT) Result Date: 08/30/2024  Lower Venous DVT Study Patient Name:  Monica Zamora  Date of Exam:   08/30/2024 Medical Rec #: [id number]            Accession #:    [id number] Date of Birth: 02-Mar-1951            Patient Gender: F Patient Age:   73 years Exam Location:  [facility] Procedure:  VAS US  LOWER EXTREMITY VENOUS (DVT) Referring Phys: PROSPER AMPONSAH --------------------------------------------------------------------------------  Indications: Swelling.  Risk Factors: None identified. Limitations: Poor ultrasound/tissue interface. Comparison Study: No prior studies. Performing Technologist: Cordella Collet RVT  Examination Guidelines: A complete evaluation includes B-mode imaging, spectral Doppler, color Doppler, and power Doppler as needed of all accessible portions of each vessel. Bilateral testing is considered an integral part of a complete examination. Limited examinations for reoccurring indications may be performed as noted. The reflux portion of the exam is performed with the patient in reverse Trendelenburg.  +---------+---------------+---------+-----------+----------+--------------+ RIGHT    CompressibilityPhasicitySpontaneityPropertiesThrombus Aging +---------+---------------+---------+-----------+----------+--------------+ CFV      Full           Yes      No                                  +---------+---------------+---------+-----------+----------+--------------+ SFJ      Full                                                         +---------+---------------+---------+-----------+----------+--------------+ FV Prox  Full                                                        +---------+---------------+---------+-----------+----------+--------------+ FV Mid   Full                                                        +---------+---------------+---------+-----------+----------+--------------+ FV Distal               Yes      No                                  +---------+---------------+---------+-----------+----------+--------------+ PFV      Full                                                        +---------+---------------+---------+-----------+----------+--------------+ POP      Full           Yes      No                                  +---------+---------------+---------+-----------+----------+--------------+ PTV      Full                                                        +---------+---------------+---------+-----------+----------+--------------+ PERO     Full                                                        +---------+---------------+---------+-----------+----------+--------------+   +---------+---------------+---------+-----------+----------+-------------------+  LEFT     CompressibilityPhasicitySpontaneityPropertiesThrombus Aging      +---------+---------------+---------+-----------+----------+-------------------+ CFV      Full           Yes      No                                       +---------+---------------+---------+-----------+----------+-------------------+ SFJ      Full                                                             +---------+---------------+---------+-----------+----------+-------------------+ FV Prox  Full                                                             +---------+---------------+---------+-----------+----------+-------------------+ FV Mid                  Yes      No                                        +---------+---------------+---------+-----------+----------+-------------------+ FV Distal               Yes      No                                       +---------+---------------+---------+-----------+----------+-------------------+ PFV      Full                                                             +---------+---------------+---------+-----------+----------+-------------------+ POP      Full           Yes      No                                       +---------+---------------+---------+-----------+----------+-------------------+ PTV      Full                                                             +---------+---------------+---------+-----------+----------+-------------------+ PERO                                                  Not well visualized +---------+---------------+---------+-----------+----------+-------------------+    Summary: RIGHT: - There is no evidence  of deep vein thrombosis in the lower extremity. However, portions of this examination were limited- see technologist comments above.  - No cystic structure found in the popliteal fossa.  LEFT: - There is no evidence of deep vein thrombosis in the lower extremity. However, portions of this examination were limited- see technologist comments above.  - No cystic structure found in the popliteal fossa.  *See table(s) above for measurements and observations.    Preliminary    DG Chest Portable 1 View Result Date: 08/29/2024 EXAM: 1 VIEW(S) XRAY OF THE CHEST 08/29/2024 04:36:00 PM COMPARISON: CT chest dated 08/26/2024 CLINICAL HISTORY: CHF FINDINGS: LUNGS AND PLEURA: Small bilateral pleural effusions, new. Mild bibasilar opacities, likely atelectasis. No frank interstitial edema. No pneumothorax. HEART AND MEDIASTINUM: Stable cardiomegaly. Thoracic atherosclerosis. BONES AND SOFT TISSUES: Median sternotomy. No acute osseous abnormality. IMPRESSION: 1. Stable  cardiomegaly. 2. New small bilateral pleural effusions. 3. No frank interstitial edema. Electronically signed by: Pinkie Pebbles MD 08/29/2024 05:19 PM EST RP Workstation: [id number]    Patient Profile   73 year old female with past medical history of chronic combined systolic/diastolic congestive heart failure, persistent atrial fibrillation, recent GI bleed, coronary artery disease status post coronary bypass and graft, left atrial appendage clipping, hypertension, peripheral vascular disease, severe COPD home O2 dependent, chronic kidney disease with atrial fibrillation with rapid ventricular response and acute on chronic combined systolic/diastolic congestive heart failure. Previously on amiodarone  but was discontinued in November due to severe COPD. Also had GI bleed in November and oral anticoagulation was temporarily held. Echocardiogram November 2025 showed ejection fraction 20 to 25%, severe RV dysfunction.  Admitted with worsening CHF and atrial fibrillation with rapid ventricular response.  Assessment & Plan  1 persistent atrial fibrillation with rapid ventricular response-options for antiarrhythmics are limited.  Would like to avoid amiodarone  in the setting of severe home O2 dependent COPD.  She has renal insufficiency and therefore Tikosyn likely not a good option.  Not a candidate for 1C agents due to coronary artery disease.  Will continue Toprol  100 mg daily.  Continue apixaban .  May could consider cardioversion but not clear she would hold sinus rhythm particularly in the setting of severe COPD.   2 acute on chronic systolic congestive heart failure-increase Lasix  to 60 mg IV twice daily and follow renal function closely.  She is not on spironolactone  given history of hyperkalemia.  She has had problems with recurrent UTIs and therefore not on SGLT2 inhibitor.  Also not on an ARB or ARNI due to borderline blood pressure and renal insufficiency.   3 coronary artery disease-continue  statin.  She denies chest pain.   4 chronic stage III kidney disease-continue to follow renal function.   5 hyperlipidemia-continue statin.   6 severe home O2 dependent COPD   7 history of recent GI bleed  For questions or updates, please contact [facility] Please consult [email or web address] for contact info under     Signed, Redell Shallow, MD  08/30/2024, 10:31 AM

## 2024-08-30 NOTE — Hospital Course (Addendum)
 Mrs. Monica Zamora was admitted to the hospital with the working diagnosis of atrial fibrillation with RVR.   73 yo female with the past medical history of atrial fibrillation, heart failure, coronary artery disease, hypertension, and CKD who presented with fatigue.  Reported dyspnea on exertion, palpitations, lower extremity edema and general fatigue.  Recent hospitalization 11/12 to 08/02/24 for atrial fibrillation, she was discharged on metoprolol  and anticoagulation with apixaban .  On the day of admission she was evaluated at the outpatient cardiology clinic, she was found in atrial fibrillation with RVR along with volume overloaded, she failed outpatient therapy, then she was referred to the ED.  On her initial physical examination her blood pressure was 120/83, HR 134, RR 22 and 02 saturation 100% on supplemental 02 per [place] [facility] with no wheezing, heart with S1 and S2 present, irregularly irregular with no gallops or rubs, abdomen with no distention and positive lower extremity edema.   Na 136, K 4,4 Cl 96 bicarbonate 28 glucose 95 bun 27 cr 1,71  BNP 2,397 High sensitive troponin 9 and 10  Wbc 5.1 hgb 9.6 plt 195  Urine analysis SG 1,005, protein negative, large leukocytes and negative hgb, 6-10 wbc and 0-5 rbc   Chest radiograph with right rotation, positive cardiomegaly, bilateral hilar vascular congestion and small bilateral pleural effusions, sternotomy wires in place.   EKG 114 bpm, normal axis, normal intervals, qtc 463, atrial fibrillation rhythm with no significant ST segment or T wave changes.   Patient was placed on IV furosemide  for diuresis

## 2024-08-30 NOTE — Assessment & Plan Note (Signed)
 Continue blood pressure control with metoprolol  succinate

## 2024-08-30 NOTE — ED Notes (Signed)
 Elsie pinal patients caretaker, asks to be let known when she is moved if possible. Phone number is [phone].

## 2024-08-30 NOTE — Progress Notes (Signed)
 Heart Failure Navigator Progress Note  Assessed for Heart & Vascular TOC clinic readiness.  Patient does not meet criteria due to she is an Advanced Heart Failure Team patient. .   Navigator will sign off at this time.   Stephane Haddock, BSN, Scientist, Clinical (histocompatibility And Immunogenetics) Only

## 2024-08-30 NOTE — Assessment & Plan Note (Signed)
 Echocardiogram with reduced LV systolic function EF 20 to 25%, global hypokinesis, RV with severe reduction in systolic function, no pericardial effusion, no significant valvular disease.   Continue volume overloaded.   Plan to continue diuresis with furosemide , increase dose to 60 mg IV bid Continue blood pressure monitoring on metoprolol  succinate 100 mg po daily.  Limited medical therapy due to reduced GFR acutely

## 2024-08-31 ENCOUNTER — Telehealth: Payer: Self-pay

## 2024-08-31 ENCOUNTER — Telehealth: Payer: Self-pay | Admitting: Acute Care

## 2024-08-31 ENCOUNTER — Encounter (HOSPITAL_COMMUNITY): Payer: Self-pay | Admitting: Student

## 2024-08-31 ENCOUNTER — Other Ambulatory Visit (HOSPITAL_COMMUNITY): Payer: Self-pay

## 2024-08-31 DIAGNOSIS — I5023 Acute on chronic systolic (congestive) heart failure: Secondary | ICD-10-CM | POA: Diagnosis not present

## 2024-08-31 LAB — BASIC METABOLIC PANEL WITH GFR
Anion gap: 12 (ref 5–15)
BUN: 29 mg/dL — ABNORMAL HIGH (ref 8–23)
CO2: 27 mmol/L (ref 22–32)
Calcium: 9.3 mg/dL (ref 8.9–10.3)
Chloride: 96 mmol/L — ABNORMAL LOW (ref 98–111)
Creatinine, Ser: 1.73 mg/dL — ABNORMAL HIGH (ref 0.44–1.00)
GFR, Estimated: 31 mL/min — ABNORMAL LOW (ref >60)
Glucose, Bld: 81 mg/dL (ref 70–99)
Potassium: 5.1 mmol/L (ref 3.5–5.1)
Sodium: 134 mmol/L — ABNORMAL LOW (ref 135–145)

## 2024-08-31 MED ORDER — FUROSEMIDE 10 MG/ML IJ SOLN
80.0000 mg | Freq: Two times a day (BID) | INTRAMUSCULAR | Status: DC
  Administered 2024-08-31: 19:00:00 80 mg via INTRAVENOUS
  Filled 2024-08-31 (×2): qty 8

## 2024-08-31 NOTE — Progress Notes (Addendum)
 As below, patient seen and examined.  She remains edematous.  Will increase Lasix  to 80 mg twice daily.  Heart rate is high normal to mildly elevated.  Continue Toprol .  Best option for atrial fibrillation is likely rate control if possible.  As outlined previously she would not be a good candidate for amiodarone  given baseline lung disease, likely not a Tikosyn candidate given renal insufficiency and not a candidate for 1C agents given underlying coronary disease.  Redell Shallow, MD    Progress Note  Patient Name: MEGGIE LASETER Date of Encounter: 08/31/2024 [facility] Cardiologist: Redell Shallow, MD   Interval Summary   Reported that she got confused overnight thinking she was home. She is now orientated to place and time.  Denied chest pain or shortness of breath.   Vital Signs Vitals:   08/31/24 0445 08/31/24 0500 08/31/24 0515 08/31/24 0600  BP:  90/63  100/63  Pulse: 98 (!) 104 (!) 101 (!) 52  Resp: 20 19 20 20   Temp:      TempSrc:      SpO2: 99% 98% 100% 100%    Intake/Output Summary (Last 24 hours) at 08/31/2024 0731 Last data filed at 08/30/2024 2308 Gross per 24 hour  Intake --  Output 1850 ml  Net -1850 ml      08/29/2024    1:43 PM 08/23/2024    3:01 PM 08/05/2024    2:09 PM  Last 3 Weights  Weight (lbs) 175 lb 176 lb 179 lb  Weight (kg) 79.379 kg 79.833 kg 81.194 kg      Telemetry/ECG  AF HR ~105 - Personally Reviewed  Physical Exam  GEN: No acute distress.   Neck: No JVD Cardiac: Irregularly, irregular, no murmurs, rubs, or gallops.  Respiratory: Crackles at bases GI: Soft, nontender, non-distended  MS 1+ pitting edema to right leg, 2+ pitting edema to left leg without tenderness or erythema  Assessment & Plan  73 year old female with past medical history of chronic combined systolic/diastolic congestive heart failure, persistent atrial fibrillation, recent GI bleed, coronary artery disease status post coronary bypass and  graft, left atrial appendage clipping, hypertension, peripheral vascular disease, severe COPD home O2 dependent, chronic kidney disease admitted with atrial fibrillation with rapid ventricular response and acute on chronic combined systolic/diastolic congestive heart failure. Previously on amiodarone  but was discontinued in November due to severe COPD. Also had GI bleed in November and oral anticoagulation was temporarily held. Echocardiogram November 2025 showed ejection fraction 20 to 25%, severe RV dysfunction.    Persistent atrial fibrillation with rapid ventricular response Currently in AF HR ~105 Chad Vas score 5  Previously on amiodarone  though stopped 2/2 lung disease, would like to continue to avoid. Other AAD options limited 2/2/ CAD and renal dysfunction.  May could consider cardioversion but not clear she would hold sinus rhythm particularly in the setting of severe COPD. Patient has not had 3 weeks of uninterrupted eliquis  as she missed a dose 1.5 weeks ago. Would like optimal anticoagulation prior to DCCV, though could puruse TEE/DCCV if need be this admission.   continue Toprol  100 mg daily continue apixaban  5mg  BID    Hypotension BP: 100/63, did drop to 75/46 last night.    Acute on chronic systolic congestive heart failure Diuresis increased yesterday.  Net IO Since Admission: -1,850 mL [08/31/24 0745] though suspect inaccurate as no intake charted On exam, appears volume up. L>R edema though negative DVT study  Continue IV lasix  60 mg BID, hold on  titration with hypotension seen ON BB as above Defer spironolactone  given history of hyperkalemia.  Defer SGLT2i 2/2 recurrent UTI Defer ARB/ARNI 2/2 hypotension and renal function   CAD s/p CABG She denies chest pain.  ASA deferred 2/2 chronic anticoagulation BB as above  Hyperlipidemia continue pravastatin  20 mg   Possible delirium Patient reported overnight she was confused about her surroundings. Would benefit from  delirium precautions.    Per primary Chronic stage III kidney disease COPD on home oxygen History of recent GI bleed  Chronic anemia   For questions or updates, please contact [facility] Please consult [email or web address] for contact info under       Signed, Leontine LOISE Salen, PA-C

## 2024-08-31 NOTE — Plan of Care (Signed)
°  Problem: Education: Goal: Knowledge of General Education information will improve Description: Including pain rating scale, medication(s)/side effects and non-pharmacologic comfort measures Outcome: Progressing   Problem: Health Behavior/Discharge Planning: Goal: Ability to manage health-related needs will improve Outcome: Progressing   Problem: Clinical Measurements: Goal: Ability to maintain clinical measurements within normal limits will improve Outcome: Progressing Goal: Will remain free from infection Outcome: Progressing Goal: Diagnostic test results will improve Outcome: Progressing Goal: Respiratory complications will improve Outcome: Progressing Goal: Cardiovascular complication will be avoided Outcome: Progressing   Problem: Nutrition: Goal: Adequate nutrition will be maintained Outcome: Progressing   Problem: Coping: Goal: Level of anxiety will decrease Outcome: Progressing   Problem: Elimination: Goal: Will not experience complications related to bowel motility Outcome: Progressing Goal: Will not experience complications related to urinary retention Outcome: Progressing   Problem: Pain Managment: Goal: General experience of comfort will improve and/or be controlled Outcome: Progressing   Problem: Safety: Goal: Ability to remain free from injury will improve Outcome: Progressing   Problem: Skin Integrity: Goal: Risk for impaired skin integrity will decrease Outcome: Progressing   Problem: Education: Goal: Ability to demonstrate management of disease process will improve Outcome: Progressing Goal: Ability to verbalize understanding of medication therapies will improve Outcome: Progressing Goal: Individualized Educational Video(s) Outcome: Progressing   Problem: Activity: Goal: Capacity to carry out activities will improve Outcome: Progressing   Problem: Cardiac: Goal: Ability to achieve and maintain adequate cardiopulmonary perfusion will  improve Outcome: Progressing   Problem: Education: Goal: Knowledge of disease or condition will improve Outcome: Progressing Goal: Understanding of medication regimen will improve Outcome: Progressing Goal: Individualized Educational Video(s) Outcome: Progressing   Problem: Activity: Goal: Ability to tolerate increased activity will improve Outcome: Progressing   Problem: Cardiac: Goal: Ability to achieve and maintain adequate cardiopulmonary perfusion will improve Outcome: Progressing   Problem: Health Behavior/Discharge Planning: Goal: Ability to safely manage health-related needs after discharge will improve Outcome: Progressing

## 2024-08-31 NOTE — TOC Initial Note (Signed)
 Transition of Care [facility]) - Initial/Assessment Note    Patient Details  Name: Monica Zamora MRN: [id number] Date of Birth: August 05, 1951  Transition of Care [facility]) CM/SW Contact:    Sudie Erminio Deems, RN Phone Number: 08/31/2024, 3:53 PM  Clinical Narrative:  Patient presented for fatigue and lower extremity swelling.  PTA patient states she lives alone and has a friend that is her caregiver. Patient states her sister calls and checks in often. Patient states she has a PCP and her friend takes her to appointments. Patient states caregiver is in the home from 8:00 am-5:00 pm and he helps with groceries as well. Patient is not currently active with home health services. Patient has DME: oxygen 2 Liters -[employer], rollator, shower chair, and bedside commode. Patient will benefit from PT/OT for recommendations. ICM will continue to follow for additional disposition needs.     Expected Discharge Plan: Home w Home Health Services Barriers to Discharge: Continued Medical Work up   Patient Goals and CMS Choice Patient states their goals for this hospitalization and ongoing recovery are:: patient wants to return home.          Expected Discharge Plan and Services   Discharge Planning Services: CM Consult Post Acute Care Choice: Home Health Living arrangements for the past 2 months: Single Family Home                   DME Agency: NA       HH Arranged: NA          Prior Living Arrangements/Services Living arrangements for the past 2 months: Single Family Home Lives with:: Self Patient language and need for interpreter reviewed:: Yes Do you feel safe going back to the place where you live?: Yes          Current home services: DME (oxygen -[employer] health, rollator, shower chair, bedside commode) Criminal Activity/Legal Involvement Pertinent to Current Situation/Hospitalization: No - Comment as needed  Activities of Daily Living   ADL Screening (condition at time  of admission) Independently performs ADLs?: Yes (appropriate for developmental age) Is the patient deaf or have difficulty hearing?: No Does the patient have difficulty seeing, even when wearing glasses/contacts?: No Does the patient have difficulty concentrating, remembering, or making decisions?: No  Permission Sought/Granted Permission sought to share information with : Family Supports, Case Manager                Emotional Assessment Appearance:: Appears stated age Attitude/Demeanor/Rapport: Engaged Affect (typically observed): Appropriate Orientation: : Oriented to Self, Oriented to Place, Oriented to  Time Alcohol / Substance Use: Not Applicable Psych Involvement: No (comment)  Admission diagnosis:  Paroxysmal atrial fibrillation (HCC) [I48.0] Acute on chronic systolic congestive heart failure (HCC) [I50.23] Atrial fibrillation with RVR (HCC) [I48.91] Chronic combined systolic and diastolic CHF, NYHA class 2 (HCC) [I50.42] Patient Active Problem List   Diagnosis Date Noted   Acute urinary retention 08/29/2024   Bilateral lower extremity edema 08/29/2024   Anxiety 08/29/2024   Ileocecal valve ulcer 07/31/2024   Atrophic gastritis without hemorrhage 07/29/2024   Gastrointestinal hemorrhage 07/28/2024   Atrial fibrillation with RVR (HCC) 07/27/2024   CKD stage 3a, GFR 45-59 ml/min (HCC) 07/27/2024   HFrEF (heart failure with reduced ejection fraction) (HCC) 07/27/2024   Hyperkalemia 07/27/2024   Melena 07/27/2024   Anemia 07/27/2024   Alcohol use 07/27/2024   Chest pain 07/27/2024   Chronic hypoxic respiratory failure, on home oxygen therapy (HCC) 07/27/2024   Anticoagulated 07/27/2024  Acute on chronic systolic congestive heart failure (HCC) 03/25/2023   PVD (peripheral vascular disease) 03/25/2023   Coronary artery disease 03/25/2023   Acute kidney injury superimposed on chronic kidney disease 03/25/2023   Depression 03/25/2023   Acute on chronic combined  systolic and diastolic CHF (congestive heart failure) (HCC) 03/13/2023   COPD (chronic obstructive pulmonary disease) (HCC) 08/09/2021   Paroxysmal atrial fibrillation (HCC) 08/08/2021   COPD exacerbation (HCC) 08/08/2021   Hypokalemia 08/08/2021   CAD (coronary artery disease) 08/08/2021   Essential hypertension 08/08/2021   Hyperlipidemia 08/08/2021   Elevated troponin 08/08/2021   Acute respiratory failure with hypoxemia (HCC) 08/08/2021   Thrombocytopenia 08/08/2021   PCP:  Baird Comer GAILS, NP Pharmacy:   [facility] - [place], [place] - [address] [place] [address] Phone: [phone] Fax: [phone]  [facility] [address] [place] [address] Phone: [phone] Fax: [phone]  [facility] - [place], [place] - [address] [place] [address] Phone: [phone] Fax: [phone]     Social Drivers of Health (SDOH) Social History: SDOH Screenings   Food Insecurity: No Food Insecurity (08/30/2024)  Housing: Low Risk (08/30/2024)  Transportation Needs: No Transportation Needs (08/30/2024)  Utilities: Not At Risk (08/30/2024)  Alcohol Screen: Low Risk (07/29/2024)  Financial Resource Strain: Low Risk (07/29/2024)  Physical Activity: Unknown (12/10/2023)   Received from [facility]  Social Connections: Socially Isolated (08/30/2024)  Stress: No Stress Concern Present (12/10/2023)   Received from [facility]  Recent Concern: Stress - Stress Concern Present (11/06/2023)   Received from [facility]  Tobacco Use: Medium Risk (08/29/2024)   SDOH Interventions:     Readmission Risk Interventions     No data to display

## 2024-08-31 NOTE — Telephone Encounter (Signed)
 LR 3 6 month follow up is fine. I think a lot of this is related to her admission . Please fax results to PCP and let them know plan is for a 6 month follow up scan.  Thanks so much

## 2024-08-31 NOTE — Telephone Encounter (Signed)
 Call Report from Jane  IMPRESSION: 1. Lung-RADS 3, probably benign findings. Short-term follow-up in 6 months is recommended with repeat low-dose chest CT without contrast (please use the following order, CT CHEST LCS NODULE FOLLOW-UP W/O CM). Two new pulmonary nodules, largest 5.9 mm in the subpleural right lower lobe. 2. Spectrum of findings suggestive of mild congestive heart failure with mild cardiomegaly, mild interlobular septal thickening suggesting mild pulmonary edema, trace layering bilateral pleural effusions and small volume perihepatic ascites. 3. Aortic Atherosclerosis (ICD10-I70.0) and Emphysema (ICD10-J43.9).

## 2024-08-31 NOTE — Progress Notes (Signed)
 Ted hose placed on BLE

## 2024-08-31 NOTE — ED Notes (Addendum)
 SABRA

## 2024-08-31 NOTE — Progress Notes (Signed)
 PROGRESS NOTE    Monica Zamora  FMW:[id number] DOB: 24-Dec-1950 DOA: 08/29/2024 PCP: [name], NP  Subjective:   Hospital Course: 73 yo female with the past medical history of atrial fibrillation, heart failure, coronary artery disease, hypertension, and CKD who presented with fatigue. Reported dyspnea on exertion, palpitations, lower extremity edema and general fatigue.  Recent hospitalization 11/12 to 08/02/24 for atrial fibrillation, she was discharged on metoprolol  and anticoagulation with apixaban .  On the day of admission she was evaluated at the outpatient cardiology clinic, she was found in atrial fibrillation with RVR along with volume overloaded, she failed outpatient therapy, then she was referred to the ED. Chest radiograph with bilateral hilar vascular congestion and small bilateral pleural effusions, and she was started on IV diuresis    Assessment and Plan:  * Acute on chronic systolic congestive heart failure (HCC) Echocardiogram with reduced LV systolic function EF 20 to 25%, global hypokinesis, RV with severe reduction in systolic function, no pericardial effusion - made 1.8L urine in the last 24 hours  - continues to have LE edema  - lasix  increased to 80 mg BID - cardiology following   CAD (coronary artery disease) No chest pain, continue eliquis  and statin  Essential hypertension Continue blood pressure control with metoprolol  succinate   Afib with RVR - HR is now mostly in the 100s-110s - cardiology following - continue toprol  100 mg daily and eliquis  - not a good candidate for other therapies per cardiology, and cardioversion being considered but unclear if she would hold sinus rhythm due to her underlying lung disease   CKD stage 3a, GFR 45-59 ml/min (HCC) - Cr mostly at baseline now, has been fluctuating over the last year and currently stable - continue IV diuresis  - Avoid hypotension and nephrotoxic medications   COPD (chronic  obstructive pulmonary disease) (HCC) No signs of acute exacerbation, continue with bronchodilator therapy   PVD (peripheral vascular disease) Continue blood pressure control and statin therapy  Not on antiplatelet therapy due to full anticoagulation with apixaban   Hyperlipidemia Continue statin therapy   Depression Anxiety  Continue with diazepam  per her home regimen.     DVT prophylaxis: Place TED hose Start: 08/29/24 1951 apixaban  (ELIQUIS ) tablet 5 mg     Code Status: Full Code Disposition Plan: TBD Reason for continuing need for hospitalization: Not med ready   Objective: Vitals:   08/31/24 0732 08/31/24 0745 08/31/24 1100 08/31/24 1214  BP:   117/84   Pulse:   (!) 108   Resp:   (!) 25   Temp:  97.6 F (36.4 C)  (!) 97.4 F (36.3 C)  TempSrc:  Oral  Oral  SpO2: 100%  100%     Intake/Output Summary (Last 24 hours) at 08/31/2024 1336 Last data filed at 08/30/2024 2308 Gross per 24 hour  Intake --  Output 1850 ml  Net -1850 ml   There were no vitals filed for this visit.  Examination:  Physical Exam Vitals and nursing note reviewed.  Constitutional:      General: She is not in acute distress. Cardiovascular:     Rate and Rhythm: Tachycardia present. Rhythm irregular.  Pulmonary:     Effort: No respiratory distress.  Abdominal:     General: There is no distension.  Musculoskeletal:     Comments: 2+ pitting edema b/l      Data Reviewed: I have personally reviewed following labs and imaging studies  CBC: Recent Labs  Lab 08/29/24 1719 08/30/24 0417  WBC 5.1 4.4  NEUTROABS 3.3  --   HGB 9.6* 9.1*  HCT 30.8* 28.9*  MCV 114.1* 113.3*  PLT 195 178   Basic Metabolic Panel: Recent Labs  Lab 08/29/24 1719 08/30/24 0417 08/31/24 0344  NA 136 135 134*  K 4.4 3.1* 5.1  CL 96* 94* 96*  CO2 28 31 27   GLUCOSE 95 98 81  BUN 27* 28* 29*  CREATININE 1.71* 1.81* 1.73*  CALCIUM  8.4* 8.6* 9.3  MG 2.2  --   --    GFR: Estimated Creatinine  Clearance: 31.2 mL/min (A) (by C-G formula based on SCr of 1.73 mg/dL (H)). Liver Function Tests: Recent Labs  Lab 08/29/24 1719  AST 36  ALT 14  ALKPHOS 78  BILITOT 1.5*  PROT 6.0*  ALBUMIN 3.3*   No results for input(s): LIPASE, AMYLASE in the last 168 hours. No results for input(s): AMMONIA in the last 168 hours. Coagulation Profile: No results for input(s): INR, PROTIME in the last 168 hours. Cardiac Enzymes: No results for input(s): CKTOTAL, CKMB, CKMBINDEX, TROPONINI in the last 168 hours. ProBNP, BNP (last 5 results) Recent Labs    07/27/24 0930 08/29/24 1719  BNP 1,340.4* 2,397.9*   HbA1C: No results for input(s): HGBA1C in the last 72 hours. CBG: No results for input(s): GLUCAP in the last 168 hours. Lipid Profile: No results for input(s): CHOL, HDL, LDLCALC, TRIG, CHOLHDL, LDLDIRECT in the last 72 hours. Thyroid  Function Tests: No results for input(s): TSH, T4TOTAL, FREET4, T3FREE, THYROIDAB in the last 72 hours. Anemia Panel: No results for input(s): VITAMINB12, FOLATE, FERRITIN, TIBC, IRON, RETICCTPCT in the last 72 hours. Sepsis Labs: No results for input(s): PROCALCITON, LATICACIDVEN in the last 168 hours.  No results found for this or any previous visit (from the past 240 hours).   Radiology Studies: VAS US  LOWER EXTREMITY VENOUS (DVT) Result Date: 08/30/2024  Lower Venous DVT Study Patient Name:  Monica Zamora  Date of Exam:   08/30/2024 Medical Rec #: [id number]            Accession #:    [id number] Date of Birth: 02/16/1951            Patient Gender: F Patient Age:   81 years Exam Location:  [facility] Procedure:      VAS US  LOWER EXTREMITY VENOUS (DVT) Referring Phys: PROSPER AMPONSAH --------------------------------------------------------------------------------  Indications: Swelling.  Risk Factors: None identified. Limitations: Poor ultrasound/tissue interface. Comparison Study:  No prior studies. Performing Technologist: Cordella Collet RVT  Examination Guidelines: A complete evaluation includes B-mode imaging, spectral Doppler, color Doppler, and power Doppler as needed of all accessible portions of each vessel. Bilateral testing is considered an integral part of a complete examination. Limited examinations for reoccurring indications may be performed as noted. The reflux portion of the exam is performed with the patient in reverse Trendelenburg.  +---------+---------------+---------+-----------+----------+--------------+ RIGHT    CompressibilityPhasicitySpontaneityPropertiesThrombus Aging +---------+---------------+---------+-----------+----------+--------------+ CFV      Full           Yes      No                                  +---------+---------------+---------+-----------+----------+--------------+ SFJ      Full                                                        +---------+---------------+---------+-----------+----------+--------------+  FV Prox  Full                                                        +---------+---------------+---------+-----------+----------+--------------+ FV Mid   Full                                                        +---------+---------------+---------+-----------+----------+--------------+ FV Distal               Yes      No                                  +---------+---------------+---------+-----------+----------+--------------+ PFV      Full                                                        +---------+---------------+---------+-----------+----------+--------------+ POP      Full           Yes      No                                  +---------+---------------+---------+-----------+----------+--------------+ PTV      Full                                                        +---------+---------------+---------+-----------+----------+--------------+ PERO     Full                                                         +---------+---------------+---------+-----------+----------+--------------+   +---------+---------------+---------+-----------+----------+-------------------+ LEFT     CompressibilityPhasicitySpontaneityPropertiesThrombus Aging      +---------+---------------+---------+-----------+----------+-------------------+ CFV      Full           Yes      No                                       +---------+---------------+---------+-----------+----------+-------------------+ SFJ      Full                                                             +---------+---------------+---------+-----------+----------+-------------------+ FV Prox  Full                                                             +---------+---------------+---------+-----------+----------+-------------------+  FV Mid                  Yes      No                                       +---------+---------------+---------+-----------+----------+-------------------+ FV Distal               Yes      No                                       +---------+---------------+---------+-----------+----------+-------------------+ PFV      Full                                                             +---------+---------------+---------+-----------+----------+-------------------+ POP      Full           Yes      No                                       +---------+---------------+---------+-----------+----------+-------------------+ PTV      Full                                                             +---------+---------------+---------+-----------+----------+-------------------+ PERO                                                  Not well visualized +---------+---------------+---------+-----------+----------+-------------------+    Summary: RIGHT: - There is no evidence of deep vein thrombosis in the lower extremity. However, portions of this  examination were limited- see technologist comments above.  - No cystic structure found in the popliteal fossa.  LEFT: - There is no evidence of deep vein thrombosis in the lower extremity. However, portions of this examination were limited- see technologist comments above.  - No cystic structure found in the popliteal fossa.  *See table(s) above for measurements and observations.    Preliminary    DG Chest Portable 1 View Result Date: 08/29/2024 EXAM: 1 VIEW(S) XRAY OF THE CHEST 08/29/2024 04:36:00 PM COMPARISON: CT chest dated 08/26/2024 CLINICAL HISTORY: CHF FINDINGS: LUNGS AND PLEURA: Small bilateral pleural effusions, new. Mild bibasilar opacities, likely atelectasis. No frank interstitial edema. No pneumothorax. HEART AND MEDIASTINUM: Stable cardiomegaly. Thoracic atherosclerosis. BONES AND SOFT TISSUES: Median sternotomy. No acute osseous abnormality. IMPRESSION: 1. Stable cardiomegaly. 2. New small bilateral pleural effusions. 3. No frank interstitial edema. Electronically signed by: Pinkie Pebbles MD 08/29/2024 05:19 PM EST RP Workstation: [id number]    Scheduled Meds:  apixaban   5 mg Oral BID   Chlorhexidine  Gluconate Cloth  6 each Topical Daily   furosemide   80 mg Intravenous BID   metoprolol  succinate  100 mg Oral  QHS   multivitamin with minerals  1 tablet Oral Daily   pravastatin   20 mg Oral q1800   umeclidinium-vilanterol  1 puff Inhalation Daily   Continuous Infusions:   LOS: 2 days   Time spent: 38 minutes  Casimer Dare, MD  [facility]  08/31/2024, 1:36 PM

## 2024-08-31 NOTE — Progress Notes (Signed)
 Patient declined to try ambulating with assist for short distance, transferring to chair or trying to sit on the side of the bed.  She stated she feels she is still too short of breath with activity to attempt.  This RN did note increased anxiety, increased respiratory rate to 26-30 (baseline 18-20) and mildly decreased SPO2 to 90-92% (baseline at rest 98-100%) on oxygen at 2L/min via nasal canula (home settings) with minimal activity while assisting patient to reposition in bed for oral medications.  Will downgrade mobility level from 4 to 2, as OOB mobility attempt was declined with patient's stated reason and objective data to support.  PT/OT evaluations are not currently ordered; will need to ensure these are addressed appropriately to avoid any potential discharge barriers.  Communicated to oncoming shift and will follow-up in AM.

## 2024-09-01 ENCOUNTER — Encounter: Payer: Self-pay | Admitting: *Deleted

## 2024-09-01 DIAGNOSIS — I5023 Acute on chronic systolic (congestive) heart failure: Secondary | ICD-10-CM | POA: Diagnosis not present

## 2024-09-01 LAB — BASIC METABOLIC PANEL WITH GFR
Anion gap: 12 (ref 5–15)
BUN: 37 mg/dL — ABNORMAL HIGH (ref 8–23)
CO2: 26 mmol/L (ref 22–32)
Calcium: 9.4 mg/dL (ref 8.9–10.3)
Chloride: 93 mmol/L — ABNORMAL LOW (ref 98–111)
Creatinine, Ser: 2.27 mg/dL — ABNORMAL HIGH (ref 0.44–1.00)
GFR, Estimated: 22 mL/min — ABNORMAL LOW (ref >60)
Glucose, Bld: 92 mg/dL (ref 70–99)
Potassium: 5 mmol/L (ref 3.5–5.1)
Sodium: 131 mmol/L — ABNORMAL LOW (ref 135–145)

## 2024-09-01 MED ORDER — METOPROLOL SUCCINATE ER 50 MG PO TB24
50.0000 mg | ORAL_TABLET | Freq: Every morning | ORAL | Status: DC
  Administered 2024-09-01 – 2024-09-05 (×4): 50 mg via ORAL
  Filled 2024-09-01 (×6): qty 1

## 2024-09-01 MED ORDER — TRAMADOL HCL 50 MG PO TABS
50.0000 mg | ORAL_TABLET | Freq: Two times a day (BID) | ORAL | Status: DC | PRN
  Administered 2024-09-05: 50 mg via ORAL
  Filled 2024-09-01: qty 1

## 2024-09-01 MED ORDER — FUROSEMIDE 10 MG/ML IJ SOLN
40.0000 mg | Freq: Two times a day (BID) | INTRAMUSCULAR | Status: DC
  Administered 2024-09-01 – 2024-09-02 (×3): 40 mg via INTRAVENOUS
  Filled 2024-09-01 (×2): qty 4

## 2024-09-01 NOTE — Progress Notes (Signed)
°  Progress Note  Patient Name: Monica Zamora Date of Encounter: 09/01/2024 [facility] Cardiologist: Redell Shallow, MD   Interval Summary   Unhappy no one is addressing her right shoulder pain, which has been going on for 3 weeks. Still edematous, but significant increase in BUN/creatinine since yesterday. In/out incomplete.  Weight is lower than it was at last HF clinic visit. AFib reate control remains suboptimal (100-110s at rest.)  Vital Signs Vitals:   09/01/24 0442 09/01/24 0809 09/01/24 0832 09/01/24 0836  BP: 116/78  (!) 103/55   Pulse: (!) 109  (!) 101   Resp: 16  16   Temp: (!) 97.5 F (36.4 C)     TempSrc: Oral  Oral Oral  SpO2: 94% 97% 100%   Weight: 79.5 kg     Height:        Intake/Output Summary (Last 24 hours) at 09/01/2024 0853 Last data filed at 09/01/2024 0446 Gross per 24 hour  Intake --  Output 1200 ml  Net -1200 ml      09/01/2024    4:42 AM 08/29/2024    1:43 PM 08/23/2024    3:01 PM  Last 3 Weights  Weight (lbs) 175 lb 4.3 oz 175 lb 176 lb  Weight (kg) 79.5 kg 79.379 kg 79.833 kg      Telemetry/ECG  AFib RVR 110s - Personally Reviewed  Physical Exam  GEN: No acute distress.   Neck: 8 cm JVD Cardiac: irregular, no murmurs, rubs, or gallops. Sternotomy scar. Respiratory: diminished bilaterally. GI: Soft, nontender, non-distended  MS: 2+ ankle and pedal edema despite compression hose  Assessment & Plan  Ac on chr HFrEF:  still edematous, but have to slow down diuretics with worsening renal function. Need more accurate in/out records. Renal dysfunction and frequent UTI preclude most HF meds. Afib: RVR, Add metop succ 50 mg in AM, continue 100 mg at bedtime.  Renal dysfunction precludes digoxin and may antiarrhythmics. Amio was stopped due to lung disease. On anticoagulants. CAD s/p CABG: no angina. On statin. Not on ASA w full anticoag. Ac on CKD: slight worsening w diuresis, diuretic dose decreased. Monitor daily.   COPD/chr resp failure on chronic O2 R shoulder pain: seems alert and oriented today. Prefer acetaminophen  for pain, but can try low dose tramadol  at bedtime for her shoulder. No signs of shoulder infection or fracture. Proximal humerus appears OK on CXR. Suspect rotator cuff injury.   For questions or updates, please contact [facility] Please consult [email or web address] for contact info under         Signed, Jerel Balding, MD

## 2024-09-01 NOTE — Telephone Encounter (Signed)
 LVM to call office and review results.

## 2024-09-01 NOTE — Progress Notes (Signed)
 PROGRESS NOTE    Monica Zamora  FMW:[id number] DOB: 1951-08-07 DOA: 08/29/2024 PCP: [name], NP  Subjective: Patient more awake today, states she feels tired otherwise no other acute complaints     Hospital Course: 73 yo female with the past medical history of atrial fibrillation, heart failure, coronary artery disease, hypertension, and CKD who presented with fatigue. Reported dyspnea on exertion, palpitations, lower extremity edema and general fatigue.  Recent hospitalization 11/12 to 08/02/24 for atrial fibrillation, she was discharged on metoprolol  and anticoagulation with apixaban .  On the day of admission she was evaluated at the outpatient cardiology clinic, she was found in atrial fibrillation with RVR along with volume overloaded, she failed outpatient therapy, then she was referred to the ED. Chest radiograph with bilateral hilar vascular congestion and small bilateral pleural effusions, and she was started on IV diuresis   Assessment and Plan:  Acute on chronic systolic congestive heart failure (HCC) Echocardiogram with reduced LV systolic function EF 20 to 25%, global hypokinesis, RV with severe reduction in systolic function, no pericardial effusion - made 1.2L urine in the last 24 hours  - continues to have LE edema but improved from yesterday  - lasix  decreased to 40 mg IV BID due to AKI - cardiology following    CAD (coronary artery disease) No chest pain, continue eliquis  and statin   Essential hypertension Continue blood pressure control with metoprolol  succinate    Afib with RVR - HR is now mostly in the 100s-110s - cardiology following - toprol  increased to 50 mg in the morning and 100 mg in the evening  - continue eliquis   - not a good candidate for other therapies/medications per cardiology, and cardioversion being considered but unclear if she would hold sinus rhythm due to her underlying lung disease    AKI on CKD - her Cr increased  from 1.73 to 2.27, most likely from the diuretics  - lasix  dose decreased per cardiology - monitor BMP, K is borderline   COPD  No signs of acute exacerbation, continue with bronchodilator therapy    PVD  Not on antiplatelet therapy due to full anticoagulation with apixaban  Continue statin    Hyperlipidemia Continue statin therapy    Depression Anxiety  Continue with diazepam  per her home regimen   DVT prophylaxis: Place TED hose Start: 08/29/24 1951 apixaban  (ELIQUIS ) tablet 5 mg     Code Status: Full Code Disposition Plan: TBD Reason for continuing need for hospitalization: Not med ready   Objective: Vitals:   09/01/24 0809 09/01/24 0832 09/01/24 0836 09/01/24 1300  BP:  (!) 103/55  119/67  Pulse:  (!) 101  (!) 112  Resp:  16  16  Temp:  (!) 97.3 F (36.3 C)  (!) 97.5 F (36.4 C)  TempSrc:  Oral Oral Oral  SpO2: 97% 100%  100%  Weight:      Height:        Intake/Output Summary (Last 24 hours) at 09/01/2024 1521 Last data filed at 09/01/2024 0446 Gross per 24 hour  Intake --  Output 1200 ml  Net -1200 ml   Filed Weights   09/01/24 0442  Weight: 79.5 kg    Examination:  Physical Exam Vitals and nursing note reviewed.  Constitutional:      General: She is not in acute distress. Cardiovascular:     Rate and Rhythm: Tachycardia present. Rhythm irregular.  Pulmonary:     Effort: No respiratory distress.  Abdominal:     General: There  is no distension.     Tenderness: There is no abdominal tenderness.  Musculoskeletal:     Comments: Much improved edema, still with 1-2+ pitting edema     Data Reviewed: I have personally reviewed following labs and imaging studies  CBC: Recent Labs  Lab 08/29/24 1719 08/30/24 0417  WBC 5.1 4.4  NEUTROABS 3.3  --   HGB 9.6* 9.1*  HCT 30.8* 28.9*  MCV 114.1* 113.3*  PLT 195 178   Basic Metabolic Panel: Recent Labs  Lab 08/29/24 1719 08/30/24 0417 08/31/24 0344 09/01/24 0435  NA 136 135 134* 131*  K  4.4 3.1* 5.1 5.0  CL 96* 94* 96* 93*  CO2 28 31 27 26   GLUCOSE 95 98 81 92  BUN 27* 28* 29* 37*  CREATININE 1.71* 1.81* 1.73* 2.27*  CALCIUM  8.4* 8.6* 9.3 9.4  MG 2.2  --   --   --    GFR: Estimated Creatinine Clearance: 23.8 mL/min (A) (by C-G formula based on SCr of 2.27 mg/dL (H)). Liver Function Tests: Recent Labs  Lab 08/29/24 1719  AST 36  ALT 14  ALKPHOS 78  BILITOT 1.5*  PROT 6.0*  ALBUMIN 3.3*   No results for input(s): LIPASE, AMYLASE in the last 168 hours. No results for input(s): AMMONIA in the last 168 hours. Coagulation Profile: No results for input(s): INR, PROTIME in the last 168 hours. Cardiac Enzymes: No results for input(s): CKTOTAL, CKMB, CKMBINDEX, TROPONINI in the last 168 hours. ProBNP, BNP (last 5 results) Recent Labs    07/27/24 0930 08/29/24 1719  BNP 1,340.4* 2,397.9*   HbA1C: No results for input(s): HGBA1C in the last 72 hours. CBG: No results for input(s): GLUCAP in the last 168 hours. Lipid Profile: No results for input(s): CHOL, HDL, LDLCALC, TRIG, CHOLHDL, LDLDIRECT in the last 72 hours. Thyroid  Function Tests: No results for input(s): TSH, T4TOTAL, FREET4, T3FREE, THYROIDAB in the last 72 hours. Anemia Panel: No results for input(s): VITAMINB12, FOLATE, FERRITIN, TIBC, IRON, RETICCTPCT in the last 72 hours. Sepsis Labs: No results for input(s): PROCALCITON, LATICACIDVEN in the last 168 hours.  No results found for this or any previous visit (from the past 240 hours).   Radiology Studies: No results found.  Scheduled Meds:  apixaban   5 mg Oral BID   Chlorhexidine  Gluconate Cloth  6 each Topical Daily   furosemide   40 mg Intravenous BID   metoprolol  succinate  100 mg Oral QHS   metoprolol  succinate  50 mg Oral q AM   multivitamin with minerals  1 tablet Oral Daily   pravastatin   20 mg Oral q1800   umeclidinium-vilanterol  1 puff Inhalation Daily   Continuous  Infusions:   LOS: 3 days   Time spent: 38 minutes  Casimer Dare, MD  [facility]  09/01/2024, 3:21 PM

## 2024-09-01 NOTE — Telephone Encounter (Signed)
 Attempted to contact pt but unable to leave voicemail as VM box is full.  Mychart letter sent to pt asking her to contact office to discuss results.

## 2024-09-02 ENCOUNTER — Other Ambulatory Visit (HOSPITAL_COMMUNITY): Payer: Self-pay

## 2024-09-02 DIAGNOSIS — I5023 Acute on chronic systolic (congestive) heart failure: Secondary | ICD-10-CM | POA: Diagnosis not present

## 2024-09-02 DIAGNOSIS — I5082 Biventricular heart failure: Secondary | ICD-10-CM | POA: Diagnosis not present

## 2024-09-02 DIAGNOSIS — N179 Acute kidney failure, unspecified: Secondary | ICD-10-CM | POA: Diagnosis not present

## 2024-09-02 DIAGNOSIS — I4891 Unspecified atrial fibrillation: Secondary | ICD-10-CM | POA: Diagnosis not present

## 2024-09-02 DIAGNOSIS — N183 Chronic kidney disease, stage 3 unspecified: Secondary | ICD-10-CM | POA: Diagnosis not present

## 2024-09-02 LAB — BASIC METABOLIC PANEL WITH GFR
Anion gap: 11 (ref 5–15)
Anion gap: 10 (ref 5–15)
BUN: 38 mg/dL — ABNORMAL HIGH (ref 8–23)
BUN: 39 mg/dL — ABNORMAL HIGH (ref 8–23)
CO2: 31 mmol/L (ref 22–32)
CO2: 33 mmol/L — ABNORMAL HIGH (ref 22–32)
Calcium: 8.8 mg/dL — ABNORMAL LOW (ref 8.9–10.3)
Calcium: 9.4 mg/dL (ref 8.9–10.3)
Chloride: 94 mmol/L — ABNORMAL LOW (ref 98–111)
Chloride: 95 mmol/L — ABNORMAL LOW (ref 98–111)
Creatinine, Ser: 2.01 mg/dL — ABNORMAL HIGH (ref 0.44–1.00)
Creatinine, Ser: 1.86 mg/dL — ABNORMAL HIGH (ref 0.44–1.00)
GFR, Estimated: 26 mL/min — ABNORMAL LOW
GFR, Estimated: 28 mL/min — ABNORMAL LOW
Glucose, Bld: 105 mg/dL — ABNORMAL HIGH (ref 70–99)
Glucose, Bld: 138 mg/dL — ABNORMAL HIGH (ref 70–99)
Potassium: 3.7 mmol/L (ref 3.5–5.1)
Potassium: 3.6 mmol/L (ref 3.5–5.1)
Sodium: 136 mmol/L (ref 135–145)
Sodium: 137 mmol/L (ref 135–145)

## 2024-09-02 LAB — CBC
HCT: 29 % — ABNORMAL LOW (ref 36.0–46.0)
Hemoglobin: 9.5 g/dL — ABNORMAL LOW (ref 12.0–15.0)
MCH: 36 pg — ABNORMAL HIGH (ref 26.0–34.0)
MCHC: 32.8 g/dL (ref 30.0–36.0)
MCV: 109.8 fL — ABNORMAL HIGH (ref 80.0–100.0)
Platelets: 201 K/uL (ref 150–400)
RBC: 2.64 MIL/uL — ABNORMAL LOW (ref 3.87–5.11)
RDW: 15.1 % (ref 11.5–15.5)
WBC: 4.9 K/uL (ref 4.0–10.5)
nRBC: 0 % (ref 0.0–0.2)

## 2024-09-02 LAB — LACTIC ACID, PLASMA
Lactic Acid, Venous: 1 mmol/L (ref 0.5–1.9)
Lactic Acid, Venous: 1.1 mmol/L (ref 0.5–1.9)

## 2024-09-02 MED ORDER — FUROSEMIDE 10 MG/ML IJ SOLN
40.0000 mg | Freq: Once | INTRAMUSCULAR | Status: AC
  Administered 2024-09-02: 40 mg via INTRAVENOUS
  Filled 2024-09-02: qty 4

## 2024-09-02 MED ORDER — METOPROLOL SUCCINATE ER 50 MG PO TB24
50.0000 mg | ORAL_TABLET | Freq: Every day | ORAL | Status: DC
  Filled 2024-09-02 (×2): qty 1

## 2024-09-02 MED ORDER — AMIODARONE HCL IN DEXTROSE 360-4.14 MG/200ML-% IV SOLN
60.0000 mg/h | INTRAVENOUS | Status: DC
  Administered 2024-09-02: 60 mg/h via INTRAVENOUS
  Filled 2024-09-02: qty 200

## 2024-09-02 MED ORDER — FUROSEMIDE 10 MG/ML IJ SOLN
80.0000 mg | Freq: Two times a day (BID) | INTRAMUSCULAR | Status: DC
  Administered 2024-09-02 – 2024-09-03 (×2): 80 mg via INTRAVENOUS
  Filled 2024-09-02 (×2): qty 8

## 2024-09-02 MED ORDER — AMIODARONE HCL IN DEXTROSE 360-4.14 MG/200ML-% IV SOLN
30.0000 mg/h | INTRAVENOUS | Status: DC
  Administered 2024-09-02 – 2024-09-05 (×6): 30 mg/h via INTRAVENOUS
  Filled 2024-09-02 (×8): qty 200

## 2024-09-02 NOTE — Progress Notes (Signed)
 Pt is alert and fully oriented x 4, afebrile, on 2-3 LPM of NCL, normal respiratory effort at rest, but SOB with exertion.   We will hold Metoprolol  100 mg tonight due to Pt is having soft BP per document below. Atrial fibrillation HR is under controlled. Pt is able to rest well with no major complaints. Plan of care is reviewed. We no acute distress. We will continue to monitor.   09/01/24 2133  Vitals  Temp 97.7 F (36.5 C)  Temp Source Oral  BP (!) 99/56  MAP (mmHg) 66  BP Location Right Arm  BP Method Automatic  Patient Position (if appropriate) Lying  Pulse Rate (!) 107  Pulse Rate Source Monitor  ECG Heart Rate (!) 114  Resp 20  Level of Consciousness  Level of Consciousness Alert  Oxygen Therapy  SpO2 95 %  O2 Device Nasal Cannula  O2 Flow Rate (L/min) 2 L/min     Wendi Dash, RN

## 2024-09-02 NOTE — Care Management Important Message (Signed)
 Important Message  Patient Details  Name: Monica Zamora MRN: [id number] Date of Birth: [birth year]   Important Message Given:  Yes - Medicare IM     [name] 09/02/2024, 11:35 AM

## 2024-09-02 NOTE — Telephone Encounter (Signed)
Pt is still hospitalized.

## 2024-09-02 NOTE — Evaluation (Signed)
Physical Therapy Evaluation Patient Details Name: Monica Zamora MRN: [id number] DOB: October 24, 1950 Today's Date: 09/02/2024  History of Present Illness  73 y.o. female presents to [facility] 08/29/24 from OP cardiologist for CHF exacerbation and a-fib w/ RVR. Chest radiograph showed B hilar vascular congestion and small B pleural effusions. PMHx: Afib, CAD s/p CABG, COPD, HFmrEF, HTN, PVD, HLD, CKD 3A,   Clinical Impression  PTA pt would ambulate with use of rollator with hx of at least 3 falls. Pt lives alone with a caregiver from 8 am-5 pm with assist for medication management, bathing/dressing, driving, groceries and occasionally for mobility. Pt required up to ModA for bed mobility with pt stating she usually sleeps in a recliner. Able to then stand and ambulate 12ft with CGA and RW. Distance limited by pt wanting to return to supine to watch her favorite show. Pt feels comfortable d/c home with current support system. Recommending HHPT to work towards independence with mobility. Acute PT to follow.    92% SpO2 on 1.5 L 112-134 BPM during session      If plan is discharge home, recommend the following: A lot of help with walking and/or transfers;A lot of help with bathing/dressing/bathroom;Assist for transportation;Help with stairs or ramp for entrance;Assistance with cooking/housework;Direct supervision/assist for medications management   Can travel by private vehicle    Yes    Equipment Recommendations None recommended by PT     Functional Status Assessment Patient has had a recent decline in their functional status and demonstrates the ability to make significant improvements in function in a reasonable and predictable amount of time.     Precautions / Restrictions Precautions Precautions: Fall Recall of Precautions/Restrictions: Intact Precaution/Restrictions Comments: watch HR Restrictions Weight Bearing Restrictions Per Provider Order: No      Mobility  Bed  Mobility Overal bed mobility: Needs Assistance Bed Mobility: Supine to Sit, Sit to Supine    Supine to sit: Min assist, HOB elevated Sit to supine: Mod assist, HOB elevated   General bed mobility comments: MinA for slight trunk raise. ModA for LE management for return to supine    Transfers Overall transfer level: Needs assistance Equipment used: Rolling walker (2 wheels) Transfers: Sit to/from Stand Sit to Stand: Contact guard assist    General transfer comment: CGA for safety with cues for hand placement.    Ambulation/Gait Ambulation/Gait assistance: Supervision Gait Distance (Feet): 20 Feet Assistive device: Rolling walker (2 wheels) Gait Pattern/deviations: Step-through pattern, Decreased stride length, Shuffle Gait velocity: decr    General Gait Details: Slow and steady gait with shuffling steps. Pt limiting gait distance due to wanting to watch her show     Balance Overall balance assessment: Mild deficits observed, not formally tested, Needs assistance, History of Falls Sitting-balance support: No upper extremity supported, Feet supported Sitting balance-Leahy Scale: Good     Standing balance support: Bilateral upper extremity supported, During functional activity, Reliant on assistive device for balance Standing balance-Leahy Scale: Poor Standing balance comment: reliant on UE support       Pertinent Vitals/Pain Pain Assessment Pain Assessment: No/denies pain    Home Living Family/patient expects to be discharged to:: Private residence Living Arrangements: Alone Available Help at Discharge: Available PRN/intermittently Type of Home: House Home Access: Stairs to enter Entrance Stairs-Rails: None Entrance Stairs-Number of Steps: 1 (stoop)   Home Layout: One level Home Equipment: Shower seat;Grab bars - toilet;Grab bars - tub/shower;Rollator (4 wheels) (portable O2)      Prior Function Prior Level of Function :  Needs assist;History of Falls (last six  months)    Mobility Comments: Sleeps in recliner. Occasional assist needed for mobility. However, normally ModI with rollator. At least 3 falls with pt unable to stand up without assist ADLs Comments: Caregiver assists with pill box, cooking, cleaning. Has been sponge bathing or using wipes to clean     Extremity/Trunk Assessment   Upper Extremity Assessment Upper Extremity Assessment: Defer to OT evaluation    Lower Extremity Assessment Lower Extremity Assessment: Generalized weakness    Cervical / Trunk Assessment Cervical / Trunk Assessment: Normal  Communication   Communication Communication: Impaired Factors Affecting Communication: Hearing impaired    Cognition Arousal: Alert Behavior During Therapy: WFL for tasks assessed/performed   PT - Cognitive impairments: No apparent impairments      Following commands: Intact       Cueing Cueing Techniques: Verbal cues      PT Assessment Patient needs continued PT services  PT Problem List Decreased strength;Decreased activity tolerance;Decreased balance;Decreased mobility;Cardiopulmonary status limiting activity       PT Treatment Interventions DME instruction;Gait training;Stair training;Therapeutic exercise;Functional mobility training;Therapeutic activities;Balance training;Neuromuscular re-education;Patient/family education    PT Goals (Current goals can be found in the Care Plan section)  Acute Rehab PT Goals Patient Stated Goal: to go home PT Goal Formulation: With patient Time For Goal Achievement: 10/04/2024 Potential to Achieve Goals: Good    Frequency Min 1X/week        AM-PAC PT 6 Clicks Mobility  Outcome Measure Help needed turning from your back to your side while in a flat bed without using bedrails?: A Little Help needed moving from lying on your back to sitting on the side of a flat bed without using bedrails?: A Little Help needed moving to and from a bed to a chair (including a  wheelchair)?: A Little Help needed standing up from a chair using your arms (e.g., wheelchair or bedside chair)?: A Little Help needed to walk in hospital room?: A Little Help needed climbing 3-5 steps with a railing? : A Lot 6 Click Score: 17    End of Session Equipment Utilized During Treatment: Gait belt;Oxygen Activity Tolerance: Patient tolerated treatment well Patient left: in bed;with call bell/phone within reach;with bed alarm set Nurse Communication: Mobility status;Other (comment) (HR) PT Visit Diagnosis: Unsteadiness on feet (R26.81);Other abnormalities of gait and mobility (R26.89);Muscle weakness (generalized) (M62.81);History of falling (Z91.81)    Time: 1101-1116 PT Time Calculation (min) (ACUTE ONLY): 15 min   Charges:   PT Evaluation $PT Eval Low Complexity: 1 Low   PT General Charges $$ ACUTE PT VISIT: 1 Visit       Kate ORN, PT, DPT Secure Chat Preferred  Rehab Office [phone]   Kate BRAVO Wendolyn 09/02/2024, 12:42 PM

## 2024-09-02 NOTE — Progress Notes (Addendum)
 "  Progress Note  Patient Name: Monica Zamora Date of Encounter: 09/02/2024 [facility] Cardiologist: Redell Shallow, MD   Interval Summary   Today patient reports she is feeling improved overall.  Heart rates mildly uncontrolled.  Creatinine continues to increase.  Not having any significant shortness of breath, denies chest pain.  Vital Signs Vitals:   09/02/24 0357 09/02/24 0445 09/02/24 0534 09/02/24 0749  BP:   103/69 109/69  Pulse:   98 (!) 113  Resp:   18 17  Temp: (!) 97 F (36.1 C)  (!) 97.3 F (36.3 C) 98 F (36.7 C)  TempSrc: Oral  Oral Oral  SpO2:   99% 96%  Weight:  78.1 kg    Height:        Intake/Output Summary (Last 24 hours) at 09/02/2024 0750 Last data filed at 09/02/2024 0446 Gross per 24 hour  Intake 250 ml  Output 1750 ml  Net -1500 ml      09/02/2024    4:45 AM 09/01/2024    4:42 AM 08/29/2024    1:43 PM  Last 3 Weights  Weight (lbs) 172 lb 2.9 oz 175 lb 4.3 oz 175 lb  Weight (kg) 78.1 kg 79.5 kg 79.379 kg      Telemetry/ECG  Atrial fibrillation heart rate between 100-120- Personally Reviewed  Physical Exam  GEN: No acute distress.   Neck: positive JVD Cardiac: IRRR, no murmurs, rubs, or gallops.  Respiratory: +crackles GI: Soft, nontender, non-distended  MS: 1+ edem, compression stockings  Patient Profile Patient with past medical history significant for chronic HFrEF, persistent atrial fibrillation with recent GI bleed, CAD status post CABG 2021 with LAA, hypertension, PAD, COPD with chronic respiratory failure on supplemental oxygen, CKD.  Patient recently seen in clinic 12/15 noted to be in  acute heart failure exacerbation with RVR, admitted to the hospital.  Assessment & Plan   Acute on chronic HFrEF -07/2024 echocardiogram with EF 20 to 25%, global hypokinesis.  Severely reduced RV.  No significant valve disease.  We have been trying to diurese patient but had increasing creatinine levels so this has been  decreased but with subpar diuresis and remains to be volume up on exam although having improvement in symptoms.  Hx of severe RV failure requiring advanced heart failure assistance in the past.  -1.7 L in the last 24 hours.  CHF exacerbation also complicated by RVR.  BP has been soft. Currently on IV Lasix  40 mg twice daily.  Creatinine continues to rise, has soft pressures.  I think if she continues to trend in this direction may need to consider right heart catheterization and advanced heart failure assistance. GDMT: Underlying renal disease and history of UTIs presents challenges.  Currently on Toprol  XL 50 mg in the morning and 100 mg at night.  Reluctant to increase beta-blocker with EF with concern of reduced cardiac output.  Will draw lactic acid.   Persistent atrial fibrillation Permanent atrial fibrillation may be more appropriate diagnosis.  Heart rates remain to be slightly uncontrolled between 100-120.   Options for antiarrhythmics is very limited given underlying severe COPD, CAD, renal dysfunction.  So therefore Tikosyn, amiodarone , digoxin not preferred.  Likely with no future plans of cardioversion and also missed DOAC doses prior to admission. For now continue Eliquis  5 mg twice daily, for now continue Toprol -XL 50 mg in the morning and 100 mg at night.  CAD status post CABG 2021 -03/2023 patent grafts, severely elevated right-sided pressures No anginal complaints  here. Continue pravastatin  20 mg.  Acute on chronic CKD Renal function continues to worsen.  Creatinine 1.73-2.27 today  COPD Severe COPD requiring chronic oxygen.  For questions or updates, please contact [facility] Please consult [email or web address] for contact info under       Signed, Thom LITTIE Sluder, PA-C     Randie LULLA Monica Zamora was seen by me today along with Thom Sluder, PA-C. I have personally performed an evaluation on this patient.  My findings are as follows: 73 y.o. female with history of  chronic systolic CHF, persistent atrial fib, recent GI bleeding, CAD s/p CABG, HTN, PAD, COPD with chronic resp failure and CKD admitted with acute CHF and rapid atrial fib.  Feeling much better  Data: EKG(s) and pertinent labs, studies, etc were personally reviewed and interpreted by me:  No EKG today Tele: Atrial fib, rates 90-100s Labs reviewed by me Otherwise, I agree with data as outlined by the advanced practice provider.  Exam performed by me: Gen: NAD Neck: No JVD Cardiac: Irreg irreg Lungs: rhonci bilat Extremities: trace LE edema  My Assessment and Plan:  Acute on chronic systolic CHF: LVEF 20-25% with global hypokinesis. Severe RV dysfunction. She was admitted with volume overload on 08/29/24. She has been diuresed with IV Lasix . Net negative 4 liters since admission. With diuresis she has had slightly worsened renal function although now stable. She has had symptomatic improvement with diuresis.  -Continue beta blocker.  -Will ask the Advanced Heart Failure team for recs today. I suspect we are near euvolemia. May need to consider a right heart cath.   Atrial fib with RVR: Rates controlled today. Not a good candidate for amio given underlying COPD. Can consider cardioversion which may help with her overall clinical picture.  -Continue Metoprolol  and Eliquis   CAD s/p CABG: No angina. Continue statin   Signed,  Lonni Cash, MD  09/02/2024 11:12 AM   "

## 2024-09-02 NOTE — Evaluation (Signed)
Occupational Therapy Evaluation Patient Details Name: Monica Zamora MRN: [id number] DOB: [birth year] Today's Date: 09/02/2024   History of Present Illness   73 y.o. female presents to [facility] 08/29/24 from OP cardiologist for CHF exacerbation and a-fib w/ RVR. Chest radiograph showed B hilar vascular congestion and small B pleural effusions. PMHx: Afib, CAD s/p CABG, COPD, HFmrEF, HTN, PVD, HLD, CKD 3A,     Clinical Impressions Pt resting comfortably in bed, c/o some fatigue. Pt lives with friend who works 8-5, assists Pt with IADLs, set up for ADLs, and transportation. Pt able to complete ADLs without physical assist at baseline, uses rollator for mobility. Pt currently limited by decreased strength and activity tolerance. Pt quickly fatigues and needs increased assistance for dressing/bathing. Pt hopeful to improve quickly, recommending HHOT follow up, BSC for return home. Pt would benefit from continued acute OT to maximize activity tolerance and independence with ADLs.      If plan is discharge home, recommend the following:   A little help with walking and/or transfers;A little help with bathing/dressing/bathroom;Assistance with cooking/housework;Assist for transportation     Functional Status Assessment   Patient has had a recent decline in their functional status and demonstrates the ability to make significant improvements in function in a reasonable and predictable amount of time.     Equipment Recommendations   BSC/3in1     Recommendations for Other Services         Precautions/Restrictions   Precautions Precautions: Fall Recall of Precautions/Restrictions: Intact Precaution/Restrictions Comments: watch HR Restrictions Weight Bearing Restrictions Per Provider Order: No     Mobility Bed Mobility Overal bed mobility: Needs Assistance Bed Mobility: Supine to Sit, Sit to Supine     Supine to sit: Min assist, HOB elevated Sit to supine: Mod assist, HOB  elevated   General bed mobility comments: MinA for slight trunk raise. ModA for LE management for return to supine    Transfers Overall transfer level: Needs assistance Equipment used: Rolling walker (2 wheels) Transfers: Sit to/from Stand Sit to Stand: Min assist           General transfer comment: min A for STS from standard bed height, CGA once upright, min A for 1X LOB      Balance Overall balance assessment: Needs assistance, History of Falls Sitting-balance support: No upper extremity supported, Feet supported Sitting balance-Leahy Scale: Good     Standing balance support: Bilateral upper extremity supported, During functional activity, Reliant on assistive device for balance Standing balance-Leahy Scale: Poor Standing balance comment: reliant on RW for support, LOB at times requiring support to maintain balance                           ADL either performed or assessed with clinical judgement   ADL Overall ADL's : Needs assistance/impaired Eating/Feeding: Independent   Grooming: Set up;Sitting   Upper Body Bathing: Minimal assistance;Sitting   Lower Body Bathing: Minimal assistance;Sitting/lateral leans;Sit to/from stand   Upper Body Dressing : Minimal assistance;Sitting   Lower Body Dressing: Minimal assistance;Sitting/lateral leans;Sit to/from stand   Toilet Transfer: Minimal assistance;Rolling walker (2 wheels);BSC/3in1   Toileting- Clothing Manipulation and Hygiene: Minimal assistance;Sitting/lateral lean;Sit to/from stand       Functional mobility during ADLs: Contact guard assist;Rolling walker (2 wheels) General ADL Comments: min A for STS, min A for ADLs, increased time/effort, decreased activity to[place]rance.     Vision Baseline Vision/History: 1 Wears glasses Ability to See in Adequate Light:  0 Adequate Patient Visual Report: No change from baseline       Perception         Praxis         Pertinent Vitals/Pain Pain  Assessment Pain Assessment: No/denies pain     Extremity/Trunk Assessment             Communication Communication Communication: Impaired Factors Affecting Communication: Hearing impaired   Cognition Arousal: Alert Behavior During Therapy: WFL for tasks assessed/performed Cognition: No apparent impairments                               Following commands: Intact       Cueing  General Comments   Cueing Techniques: Verbal cues      Exercises     Shoulder Instructions      Home Living Family/patient expects to be discharged to:: Private residence Living Arrangements: Non-relatives/Friends Available Help at Discharge: Available PRN/intermittently Type of Home: House Home Access: Stairs to enter Secretary/administrator of Steps: 1 Entrance Stairs-Rails: None Home Layout: One level     Bathroom Shower/Tub: Tub/shower unit;Sponge bathes at baseline   [employer]: Standard     Home Equipment: Shower seat;Grab bars - toilet;Grab bars - tub/shower;Rollator (4 wheels)   Additional Comments: Pt lives with friends, rents a room from him, friend works 8-5 but takes her to appointments and helps around the house.      Prior Functioning/Environment Prior Level of Function : Needs assist;History of Falls (last six months)             Mobility Comments: Pt sleeps in flat bed, props up pillows to sit her up, wears O2 24/7. uses rollator. Pt with 4 falls in the last 6 month from trying to walk without rollator ADLs Comments: Caregiver assists with pill box, cooking, cleaning. Pt sets up Pt for ADLs, but no physical assist, sponge bathes at baseline.    OT Problem List: Decreased strength;Decreased range of motion;Decreased activity tolerance;Impaired balance (sitting and/or standing);Pain   OT Treatment/Interventions: Self-care/ADL training;Therapeutic exercise;Energy conservation;DME and/or AE instruction;Therapeutic activities;Patient/family  education;Balance training      OT Goals(Current goals can be found in the care plan section)   Acute Rehab OT Goals Patient Stated Goal: to return home OT Goal Formulation: With patient Time For Goal Achievement: 10/03/2024 Potential to Achieve Goals: Good   OT Frequency:  Min 2X/week    Co-evaluation              AM-PAC OT 6 Clicks Daily Activity     Outcome Measure Help from another person eating meals?: None Help from another person taking care of personal grooming?: A Little Help from another person toileting, which includes using toliet, bedpan, or urinal?: A Little Help from another person bathing (including washing, rinsing, drying)?: A Lot Help from another person to put on and taking off regular upper body clothing?: A Little Help from another person to put on and taking off regular lower body clothing?: A Lot 6 Click Score: 17   End of Session Equipment Utilized During Treatment: Gait belt;Rolling walker (2 wheels) Nurse Communication: Mobility status  Activity Tolerance: Patient limited by fatigue Patient left: in bed;with call bell/phone within reach;with bed alarm set  OT Visit Diagnosis: Unsteadiness on feet (R26.81);Other abnormalities of gait and mobility (R26.89);Muscle weakness (generalized) (M62.81);History of falling (Z91.81);Repeated falls (R29.6)  Time: 8461-8396 OT Time Calculation (min): 25 min Charges:  OT General Charges $OT Visit: 1 Visit OT Evaluation $OT Eval Low Complexity: 1 Low OT Treatments $Self Care/Home Management : 8-22 mins  [place], OTR/L   Elouise JONELLE Bott 09/02/2024, 4:39 PM

## 2024-09-02 NOTE — Consult Note (Signed)
 "   Advanced Heart Failure Team Consult Note   Primary Physician: Baird Comer GAILS, NP Cardiologist:  Redell Shallow, MD HPI:    SHONTEL SANTEE is seen today for evaluation of heart failure  at the request of Dr Francyne.   ITZAYANA PARDY is a 73 y.o. female with with a history of paroxysmal atrial fibrillation, hypertension, chronic hypoxemic respiratory failure on 2 L nasal cannula, CAD status post CABG in 2021, CAD, heart failure with reduced ejection fraction (EF 40%), CKD IIIa, hx of tobacco use.   Cardiac history dates back to 2021 when she underwent CABG at [facility] in [place] ; also noticed to have paroxysmal atrial fibrillation on Eliquis  and Multaq  since that time.  Echocardiogram in 2022 with a EF of 50 to 55%   Admitted on 03/13/2023 with substernal chest pain with radiation to the epigastrium.  In the ER patient noted to be hypotensive with systolic blood pressure in the 70s and bradycardic to the 30s.  She was given glucagon  to reverse home beta-blocker dose. CT scan on admission also notable for mesenteric & celiac artery stenosis. She underwent LHC/RHC with significant native vessel CAD with 3 of 3 patent grafts LIMA-LAD, SVG-PDA, SVG-OM 3 and fick CO 4.5/CI 2.35.     December 2024 EF improved to 50-55% and RV low normal.    Last in SR 12/2023   Admitted with 07/27/24 with ABLA and A fib RVR. Echo showed LVEF 20-25% and RV severely reduced. BNP >1200. Colonsocopy with bleeding ulcer treated with hemoclips. Initially on amio drip but stopped due to COPD. Placed on Toprol  XL at discharge. GDMT limited by worsening renal function.    She was seen in [facility] clinic 08/05/24. At that time she was in A fib RVR. She did not want to pursue cardioversion until she talked with Dr Shallow. She was unsure she had been taking Toprol  XL. Toprol  XL increased 50 mg at bedtime. She was sent back to Dr Shallow per patient request.  She saw Dr Shallow 12/9. Remained  in persistent A fib. Toprol  XL was increased to 75 mg at bedtime and lasix  increased to 60 mg daily.   Unfortunately she presented back to the ED with ongoing A fib RVR and increased lower extremity edema. CXR with small pleural effusions. No overt edema. BNP 2397, HS Trop 9>10, Creatinine 1.7.  Overall she has been given 140 mg IV lasix . Lasix  stopped today due to worsening renal function.    Lactic acid 1.     Home Medications Prior to Admission medications  Medication Sig Start Date End Date Taking? Authorizing Provider  albuterol  (VENTOLIN  HFA) 108 (90 Base) MCG/ACT inhaler Inhale 1 puff into the lungs 2 (two) times daily as needed for wheezing or shortness of breath. 07/10/21  Yes [provider]  apixaban  (ELIQUIS ) 5 MG TABS tablet Take 1 tablet (5 mg total) by mouth 2 (two) times daily. 08/12/23  Yes Shallow Redell RAMAN, MD  cyclobenzaprine (FLEXERIL) 10 MG tablet Take 10 mg by mouth at bedtime. 12/31/23  Yes [provider]  diazepam  (VALIUM ) 5 MG tablet Take 5 mg by mouth 2 (two) times daily as needed for anxiety. 11/06/23  Yes [provider]  furosemide  (LASIX ) 40 MG tablet Take 1.5 tablets (60 mg total) by mouth daily. 08/23/24  Yes Shallow Redell RAMAN, MD  metoprolol  succinate (TOPROL -XL) 50 MG 24 hr tablet Take 1.5 tablets (75 mg total) by mouth at bedtime. Take with or immediately  following a meal. 08/23/24  Yes Crenshaw, Redell RAMAN, MD  Multiple Vitamin (MULTIVITAMIN WITH MINERALS) TABS tablet Take 1 tablet by mouth daily.   Yes [provider]  OXYGEN Inhale 2 L into the lungs continuous.   Yes [provider]  pravastatin  (PRAVACHOL ) 20 MG tablet Take 1 tablet (20 mg total) by mouth daily at 6 PM. 11/30/23 02/03/25 Yes Meng, Hao, PA  umeclidinium-vilanterol (ANORO ELLIPTA ) 62.5-25 MCG/ACT AEPB Inhale 1 puff into the lungs daily. 08/02/24  Yes [name], DO    Past Medical History: Past Medical History:  Diagnosis Date   A-fib [facility])     Carotid artery disease    COPD (chronic obstructive pulmonary disease) (HCC)    Coronary artery disease    Former tobacco use    Heart failure with mildly reduced ejection fraction (HFmrEF) (HCC)    Hx of CABG    Hypertension    PUD (peptic ulcer disease)     Past Surgical History: Past Surgical History:  Procedure Laterality Date   ABDOMINAL HYSTERECTOMY     CARDIOVERSION N/A 03/20/2023   Procedure: CARDIOVERSION;  Surgeon: Rolan Ezra RAMAN, MD;  Location: [facility] INVASIVE CV LAB;  Service: Cardiovascular;  Laterality: N/A;   CHOLECYSTECTOMY     COLONOSCOPY N/A 07/31/2024   Procedure: COLONOSCOPY;  Surgeon: Rollin Dover, MD;  Location: [facility] ENDOSCOPY;  Service: Gastroenterology;  Laterality: N/A;   CORONARY ARTERY BYPASS GRAFT     ESOPHAGOGASTRODUODENOSCOPY N/A 07/29/2024   Procedure: EGD (ESOPHAGOGASTRODUODENOSCOPY);  Surgeon: Leigh Elspeth SQUIBB, MD;  Location: [facility] ENDOSCOPY;  Service: Gastroenterology;  Laterality: N/A;   RIGHT/LEFT HEART CATH AND CORONARY/GRAFT ANGIOGRAPHY N/A 03/17/2023   Procedure: RIGHT/LEFT HEART CATH AND CORONARY/GRAFT ANGIOGRAPHY;  Surgeon: Anner Alm ORN, MD;  Location: [facility] INVASIVE CV LAB;  Service: Cardiovascular;  Laterality: N/A;   ruptured disc     SPINE SURGERY     TEE WITHOUT CARDIOVERSION N/A 03/20/2023   Procedure: TRANSESOPHAGEAL ECHOCARDIOGRAM;  Surgeon: Rolan Ezra RAMAN, MD;  Location: [facility] INVASIVE CV LAB;  Service: Cardiovascular;  Laterality: N/A;    Family History: Family History  Problem Relation Age of Onset   Lupus Mother    Lung disease Neg Hx     Social History: Social History   Socioeconomic History   Marital status: Divorced    Spouse name: Not on file   Number of children: 2   Years of education: Not on file   Highest education level: 11th grade  Occupational History   Occupation: Retired  Tobacco Use   Smoking status: Former    Current packs/day: 0.00    Average packs/day: 1.5 packs/day for 54.9 years (82.3 ttl pk-yrs)     Types: Cigarettes    Start date: 09/16/1967    Quit date: 08/05/2022    Years since quitting: 2.0   Smokeless tobacco: Never  Vaping Use   Vaping status: Never Used  Substance and Sexual Activity   Alcohol use: Not Currently   Drug use: Never   Sexual activity: Not on file  Other Topics Concern   Not on file  Social History Narrative   Speaks with her sister multiple times a week and has a caregiver   Social Drivers of Health   Tobacco Use: Medium Risk (08/31/2024)   Patient History    Smoking Tobacco Use: Former    Smokeless Tobacco Use: Never    Passive Exposure: Not on file  Financial Resource Strain: Low Risk (07/29/2024)   Overall Financial Resource Strain (CARDIA)  Difficulty of Paying Living Expenses: Not hard at all  Food Insecurity: No Food Insecurity (08/30/2024)   Epic    Worried About Programme Researcher, Broadcasting/film/video in the Last Year: Never true    Ran Out of Food in the Last Year: Never true  Transportation Needs: No Transportation Needs (08/30/2024)   Epic    Lack of Transportation (Medical): No    Lack of Transportation (Non-Medical): No  Physical Activity: Unknown (12/10/2023)   Received from [facility]   Exercise Vital Sign    On average, how many days per week do you engage in moderate to strenuous exercise (like a brisk walk)?: 0 days    Minutes of Exercise per Session: Not on file  Stress: No Stress Concern Present (12/10/2023)   Received from [facility] of Occupational Health - Occupational Stress Questionnaire    Feeling of Stress : Only a little  Recent Concern: Stress - Stress Concern Present (11/06/2023)   Received from [facility] of Occupational Health - Occupational Stress Questionnaire    Feeling of Stress : Very much  Social Connections: Socially Isolated (08/30/2024)   Social Connection and Isolation Panel    Frequency of Communication with Friends and Family: Three times a week    Frequency of Social  Gatherings with Friends and Family: Three times a week    Attends Religious Services: Never    Active Member of Clubs or Organizations: No    Attends Banker Meetings: Never    Marital Status: Divorced  Depression (PHQ2-9): Not on file  Alcohol Screen: Low Risk (07/29/2024)   Alcohol Screen    Last Alcohol Screening Score (AUDIT): 0  Housing: Low Risk (08/30/2024)   Epic    Unable to Pay for Housing in the Last Year: No    Number of Times Moved in the Last Year: 0    Homeless in the Last Year: No  Utilities: Not At Risk (08/30/2024)   Epic    Threatened with loss of utilities: No  Health Literacy: Not on file    Allergies:  Allergies[1]  Objective:   Vital Signs:   Temp:  [97 F (36.1 C)-98 F (36.7 C)] 98 F (36.7 C) (12/19 0749) Pulse Rate:  [98-128] 113 (12/19 0749) Resp:  [16-20] 17 (12/19 0749) BP: (90-119)/(48-79) 109/69 (12/19 0749) SpO2:  [95 %-100 %] 96 % (12/19 0749) Weight:  [78.1 kg] 78.1 kg (12/19 0445) Last BM Date : 09/01/24  Weight change: Filed Weights   09/01/24 0442 09/02/24 0445  Weight: 79.5 kg 78.1 kg    Intake/Output:   Intake/Output Summary (Last 24 hours) at 09/02/2024 0831 Last data filed at 09/02/2024 0750 Gross per 24 hour  Intake 727 ml  Output 1750 ml  Net -1023 ml    Physical Exam  General:  Weak appearing.   Cor: Irregular rate & rhythm. No murmurs. JVD 10-11  cm.  Lungs: clear Extremities: R and LLE 1+  edema   Telemetry   A fib 100s   EKG    A fib 125 on admit   Labs   Basic Metabolic Panel: Recent Labs  Lab 08/29/24 1719 08/30/24 0417 08/31/24 0344 09/01/24 0435  NA 136 135 134* 131*  K 4.4 3.1* 5.1 5.0  CL 96* 94* 96* 93*  CO2 28 31 27 26   GLUCOSE 95 98 81 92  BUN 27* 28* 29* 37*  CREATININE 1.71* 1.81* 1.73* 2.27*  CALCIUM  8.4* 8.6* 9.3  9.4  MG 2.2  --   --   --     Liver Function Tests: Recent Labs  Lab 08/29/24 1719  AST 36  ALT 14  ALKPHOS 78  BILITOT 1.5*  PROT 6.0*   ALBUMIN 3.3*   No results for input(s): LIPASE, AMYLASE in the last 168 hours. No results for input(s): AMMONIA in the last 168 hours.  CBC: Recent Labs  Lab 08/29/24 1719 08/30/24 0417  WBC 5.1 4.4  NEUTROABS 3.3  --   HGB 9.6* 9.1*  HCT 30.8* 28.9*  MCV 114.1* 113.3*  PLT 195 178    Cardiac Enzymes: No results for input(s): CKTOTAL, CKMB, CKMBINDEX, TROPONINI in the last 168 hours.  BNP: BNP (last 3 results) Recent Labs    07/27/24 0930 08/29/24 1719  BNP 1,340.4* 2,397.9*    ProBNP (last 3 results) No results for input(s): PROBNP in the last 8760 hours.   CBG: No results for input(s): GLUCAP in the last 168 hours.  Coagulation Studies: No results for input(s): LABPROT, INR in the last 72 hours.   Imaging   No results found.  Medications:   Current Medications:  apixaban   5 mg Oral BID   Chlorhexidine  Gluconate Cloth  6 each Topical Daily   furosemide   40 mg Intravenous BID   metoprolol  succinate  100 mg Oral QHS   metoprolol  succinate  50 mg Oral q AM   multivitamin with minerals  1 tablet Oral Daily   pravastatin   20 mg Oral q1800   umeclidinium-vilanterol  1 puff Inhalation Daily    Infusions:  Patient Profile   AIZZA SANTIAGO is a 73 y.o. female with chronic biventricular HFrEF, persistent A fib, CAD, mesenteric artery stenosis, renal artery stenosis, CKD stage IIIb, HTN, and chronic hypoxic respiratory failure.   Admitted with A Fib RVR and A/C Biventricular HF.   Assessment/Plan  # Persistent A fib RVR Last in SR 12/2023. Has had Persistent  A Fib  RVR over the last 3 months with minimal symptom until now. Previously on amio but stopped due to COPD. Tikosyn not an option with CKD.  - Will need to cut back Toprol  Xl to 50  mg twice a day. Down the road consider switching to bisoprolol.   -Continue eliquis  5 mg twice a day  - Dr Zenaida discussed options with her. Given persistent A fib RVR and worsening heart  failure will need to load with IV amio through the weekend, followed by TEE/DC-CV on Monday.  Informed Consent   Shared Decision Making/Informed Consent   The risks [stroke, cardiac arrhythmias rarely resulting in the need for a temporary or permanent pacemaker, skin irritation or burns, esophageal damage, perforation (1:10,000 risk), bleeding, pharyngeal hematoma as well as other potential complications associated with conscious sedation including aspiration, arrhythmia, respiratory failure and death], benefits (treatment guidance, restoration of normal sinus rhythm, diagnostic support) and alternatives of a transesophageal echocardiogram guided cardioversion were discussed in detail with Ms. Crayton and she is willing to proceed.   Plan for Monday 09/05/2024 at 730 .   # A/C Biventricular HF In 2024 EF improved 50-55% but most recent Echos with severely reduced biventricular HF. She had a cath with patent graphs. Suspect tachy mediated. As above will try to restore sinus rhythm.   Now Admitted with volume overload and worsening renal function.  Lactic Acid 1.  Remains volume overloaded. Continue 80 mg IV lasix  twice a day.  GDMT limited by CKD BB cut back Toprol  XL  50 mg twice a day  Hold off on SGLT2i due to frequent UTI   #AKI on CKD Stage IIIb Creatinine baseline unclear baseline? ~ 1. 7-1.9 Today 2. Check BMET this afternoon.   #Chronic Respiratory Failure, Oxygen Dependent, COPD On 2 liters continuously.  Sats stable.   #CAD H/O CABG 2021. Cath 2024 3 of 3 patent grafts LIMA-LAD, SVG-PDA, SVG-OM 3 No chest pain. On statin, bb, and eliquis .   # Anemia  H/O GI Bleed. 07/2024 Colonsocopy with bleeding ulcer treated with hemoclip Hgb stable 9.5     Length of Stay: 4  Janos Shampine, NP  09/02/2024, 8:31 AM  Advanced Heart Failure Team Pager [phone] (M-F; 7a - 5p)   Please visit [email or web address]: For overnight coverage please call cardiology fellow first. If fellow not available  call Shock/ECMO MD on call.  For ECMO / Mechanical Support (Impella, IABP, LVAD) issues call Shock / ECMO MD on call.      [1] No Known Allergies  "

## 2024-09-02 NOTE — Progress Notes (Signed)
 " PROGRESS NOTE    Monica Zamora  FMW:[id number] DOB: 06-03-1951 DOA: 08/29/2024 PCP: [name], NP  Subjective: Patient reports feeling better overall, feels her legs are much less swollen. Denied any palpitations. No other acute complaints     Hospital Course: 73 yo female with the past medical history of atrial fibrillation, heart failure, coronary artery disease, hypertension, and CKD who presented with fatigue. Reported dyspnea on exertion, palpitations, lower extremity edema and general fatigue.  Recent hospitalization 11/12 to 08/02/24 for atrial fibrillation, she was discharged on metoprolol  and anticoagulation with apixaban .  On the day of admission she was evaluated at the outpatient cardiology clinic, she was found in atrial fibrillation with RVR along with volume overloaded, she failed outpatient therapy, then she was referred to the ED. Chest radiograph with bilateral hilar vascular congestion and small bilateral pleural effusions, and she was started on IV diuresis   Assessment and Plan:  Acute on chronic systolic congestive heart failure Echocardiogram with reduced LV systolic function EF 20 to 25%, global hypokinesis, RV with severe reduction in systolic function, no pericardial effusion - made 1.7L urine in the last 24 hours  - continues to have LE edema but continues to improve daily - continue lasix  40 mg IV BID - cardiology following, may need a RHC    CAD No chest pain, continue eliquis  and statin   Essential hypertension Continue blood pressure control with metoprolol  succinate    Afib with RVR - HR is now mostly in the 100s-110s - cardiology following - continue toprol  50 mg in the morning and 100 mg in the evening  - continue eliquis   - not a good candidate for other therapies/medications per cardiology, and cardioversion being considered but unclear if she would hold sinus rhythm due to her underlying lung disease    AKI on CKD - her Cr  stable at 2.01 today  - monitor BMP, K is borderline    COPD  No signs of acute exacerbation, continue with bronchodilator therapy    PVD  Not on antiplatelet therapy due to full anticoagulation with apixaban  Continue statin    Hyperlipidemia Continue statin therapy    Depression, Anxiety  Continue with diazepam  per her home regimen    DVT prophylaxis: Place TED hose Start: 08/29/24 1951 apixaban  (ELIQUIS ) tablet 5 mg     Code Status: Full Code Disposition Plan: TBD Reason for continuing need for hospitalization: IV diuretics  Objective: Vitals:   09/02/24 0445 09/02/24 0534 09/02/24 0749 09/02/24 1211  BP:  103/69 109/69 103/60  Pulse:  98 (!) 113 (!) 56  Resp:  18 17 17   Temp:  (!) 97.3 F (36.3 C) 98 F (36.7 C) 98.1 F (36.7 C)  TempSrc:  Oral Oral Oral  SpO2:  99% 96% 92%  Weight: 78.1 kg     Height:        Intake/Output Summary (Last 24 hours) at 09/02/2024 1459 Last data filed at 09/02/2024 0750 Gross per 24 hour  Intake 727 ml  Output 1750 ml  Net -1023 ml   Filed Weights   09/01/24 0442 09/02/24 0445  Weight: 79.5 kg 78.1 kg    Examination:  Physical Exam Vitals and nursing note reviewed.  Constitutional:      General: She is not in acute distress. Cardiovascular:     Rate and Rhythm: Tachycardia present. Rhythm irregular.  Pulmonary:     Effort: No respiratory distress.  Abdominal:     General: There is no distension.  Tenderness: There is no abdominal tenderness.  Skin:    Comments: Improved b/l LE edema. Compression stockings on      Data Reviewed: I have personally reviewed following labs and imaging studies  CBC: Recent Labs  Lab 08/29/24 1719 08/30/24 0417 09/02/24 0828  WBC 5.1 4.4 4.9  NEUTROABS 3.3  --   --   HGB 9.6* 9.1* 9.5*  HCT 30.8* 28.9* 29.0*  MCV 114.1* 113.3* 109.8*  PLT 195 178 201   Basic Metabolic Panel: Recent Labs  Lab 08/29/24 1719 08/30/24 0417 08/31/24 0344 09/01/24 0435 09/02/24 0828   NA 136 135 134* 131* 136  K 4.4 3.1* 5.1 5.0 3.7  CL 96* 94* 96* 93* 94*  CO2 28 31 27 26 31   GLUCOSE 95 98 81 92 105*  BUN 27* 28* 29* 37* 38*  CREATININE 1.71* 1.81* 1.73* 2.27* 2.01*  CALCIUM  8.4* 8.6* 9.3 9.4 8.8*  MG 2.2  --   --   --   --    GFR: Estimated Creatinine Clearance: 26.7 mL/min (A) (by C-G formula based on SCr of 2.01 mg/dL (H)). Liver Function Tests: Recent Labs  Lab 08/29/24 1719  AST 36  ALT 14  ALKPHOS 78  BILITOT 1.5*  PROT 6.0*  ALBUMIN 3.3*   No results for input(s): LIPASE, AMYLASE in the last 168 hours. No results for input(s): AMMONIA in the last 168 hours. Coagulation Profile: No results for input(s): INR, PROTIME in the last 168 hours. Cardiac Enzymes: No results for input(s): CKTOTAL, CKMB, CKMBINDEX, TROPONINI in the last 168 hours. ProBNP, BNP (last 5 results) Recent Labs    07/27/24 0930 08/29/24 1719  BNP 1,340.4* 2,397.9*   HbA1C: No results for input(s): HGBA1C in the last 72 hours. CBG: No results for input(s): GLUCAP in the last 168 hours. Lipid Profile: No results for input(s): CHOL, HDL, LDLCALC, TRIG, CHOLHDL, LDLDIRECT in the last 72 hours. Thyroid  Function Tests: No results for input(s): TSH, T4TOTAL, FREET4, T3FREE, THYROIDAB in the last 72 hours. Anemia Panel: No results for input(s): VITAMINB12, FOLATE, FERRITIN, TIBC, IRON, RETICCTPCT in the last 72 hours. Sepsis Labs: Recent Labs  Lab 09/02/24 0828 09/02/24 1042  LATICACIDVEN 1.0 1.1    No results found for this or any previous visit (from the past 240 hours).   Radiology Studies: No results found.  Scheduled Meds:  apixaban   5 mg Oral BID   Chlorhexidine  Gluconate Cloth  6 each Topical Daily   furosemide   80 mg Intravenous BID   metoprolol  succinate  50 mg Oral q AM   metoprolol  succinate  50 mg Oral QHS   multivitamin with minerals  1 tablet Oral Daily   pravastatin   20 mg Oral q1800    umeclidinium-vilanterol  1 puff Inhalation Daily   Continuous Infusions:  amiodarone  60 mg/hr (09/02/24 1222)   Followed by   amiodarone        LOS: 4 days   Time spent: 38 minutes  Casimer Dare, MD  [facility]  09/02/2024, 2:59 PM   "

## 2024-09-02 NOTE — Progress Notes (Signed)
 OT Cancellation Note  Patient Details Name: Monica Zamora MRN: [id number] DOB: [birth year]   Cancelled Treatment:    Reason Eval/Treat Not Completed: (P) Patient declined, no reason specified, she states she was Christmas shopping on her phone and did not want to get OOB, asked to return in 2 hours, Will return as time allows.    Elouise JONELLE Bott 09/02/2024, 1:42 PM

## 2024-09-03 DIAGNOSIS — I5082 Biventricular heart failure: Secondary | ICD-10-CM | POA: Diagnosis not present

## 2024-09-03 DIAGNOSIS — N179 Acute kidney failure, unspecified: Secondary | ICD-10-CM

## 2024-09-03 DIAGNOSIS — I2510 Atherosclerotic heart disease of native coronary artery without angina pectoris: Secondary | ICD-10-CM | POA: Diagnosis not present

## 2024-09-03 DIAGNOSIS — I5023 Acute on chronic systolic (congestive) heart failure: Secondary | ICD-10-CM | POA: Diagnosis not present

## 2024-09-03 DIAGNOSIS — Z951 Presence of aortocoronary bypass graft: Secondary | ICD-10-CM | POA: Diagnosis not present

## 2024-09-03 DIAGNOSIS — I4819 Other persistent atrial fibrillation: Secondary | ICD-10-CM | POA: Diagnosis not present

## 2024-09-03 LAB — BASIC METABOLIC PANEL WITH GFR
Anion gap: 11 (ref 5–15)
BUN: 37 mg/dL — ABNORMAL HIGH (ref 8–23)
CO2: 32 mmol/L (ref 22–32)
Calcium: 8.8 mg/dL — ABNORMAL LOW (ref 8.9–10.3)
Chloride: 95 mmol/L — ABNORMAL LOW (ref 98–111)
Creatinine, Ser: 1.82 mg/dL — ABNORMAL HIGH (ref 0.44–1.00)
GFR, Estimated: 29 mL/min — ABNORMAL LOW
Glucose, Bld: 146 mg/dL — ABNORMAL HIGH (ref 70–99)
Potassium: 3.3 mmol/L — ABNORMAL LOW (ref 3.5–5.1)
Sodium: 138 mmol/L (ref 135–145)

## 2024-09-03 LAB — TSH: TSH: 4.65 u[IU]/mL — ABNORMAL HIGH (ref 0.350–4.500)

## 2024-09-03 LAB — MAGNESIUM: Magnesium: 2 mg/dL (ref 1.7–2.4)

## 2024-09-03 MED ORDER — TORSEMIDE 20 MG PO TABS
20.0000 mg | ORAL_TABLET | Freq: Every morning | ORAL | Status: DC
  Administered 2024-09-04: 20 mg via ORAL
  Filled 2024-09-03: qty 1

## 2024-09-03 NOTE — Progress Notes (Signed)
 "  Monica Zamora  FMW:[id number] DOB: 1951/08/08 DOA: 08/29/2024 PCP: [name], NP    Brief Narrative:  73 year old with a history of atrial fibrillation, CHF, CAD, HTN, and CKD who presented to the ER 12/15 on referral from her cardiologist office where she presented with generalized fatigue, DOE, lower extremity edema, and a sense of palpitations and was found to be in A-fib with RVR again.  She had been hospitalized 11/12-18/25 for atrial fibrillation with RVR and was discharged at that time on metoprolol  and apixaban .  Goals of Care:   Code Status: Full Code   DVT prophylaxis: Place TED hose Start: 08/29/24 1951 apixaban  (ELIQUIS ) tablet 5 mg   Interim Hx: Afebrile.  Heart rate reasonably controlled at 87-124.  Blood pressure modestly low at 94/66.  Oxygen saturation 97% on 2 L nasal cannula.  No acute events recorded overnight.  Resting comfortably at the time of my visit. Tells me she is feeling much better.   Assessment & Plan:  Atrial fibrillation with acute RVR Care per cardiology -continue Eliquis  - history of prior DCCV -amiodarone  initiated with plans for TEE/DCCV 12/22 -magnesium  normal -TSH not low  Acute exacerbation of chronic systolic congestive heart failure with right heart failure TTE noted EF 20-25% with global hypokinesis and severe RV systolic failure -advanced heart failure team following  CAD status post CABG 2021 Asymptomatic at present -3 of 3 grafts patent on cardiac cath 2024 - continue statin  Macrocytic anemia History of GI bleed November 2025 due to bleeding ulcer with Hemoclip -hemoglobin stable at present  Hypokalemia Due to diuresis and poor intake -supplement to goal of 4.0 -magnesium  is normal  HTN BP well-controlled  AKI on CKD stage IIIb Creatinine stable at present - baseline creatinine likely approximately 1.3  COPD Well compensated at present  PVD Not on antiplatelet therapy due to use of apixaban  -continue  statin  Depression/anxiety Continue usual diazepam  dose   Family Communication: no family present at time of exam Disposition:   Home Health Pt12/19/2025 1235  Objective: Blood pressure 94/66, pulse (!) 103, temperature 98.6 F (37 C), temperature source Oral, resp. rate 16, height 5' 6 (1.676 m), weight 77 kg, SpO2 97%.  Intake/Output Summary (Last 24 hours) at 09/03/2024 0917 Last data filed at 09/03/2024 0656 Gross per 24 hour  Intake 974.48 ml  Output 2200 ml  Net -1225.52 ml   Filed Weights   09/01/24 0442 09/02/24 0445 09/03/24 0410  Weight: 79.5 kg 78.1 kg 77 kg    Examination: General: No acute respiratory distress Lungs: Clear to auscultation bilaterally without wheezes or crackles Cardiovascular: irreg irreg - no appreciable M  Abdomen: Nontender, nondistended, soft, bowel sounds positive, no rebound, no ascites, no appreciable mass Extremities: No significant cyanosis, clubbing, or edema bilateral lower extremities  CBC: Recent Labs  Lab 08/29/24 1719 08/30/24 0417 09/02/24 0828  WBC 5.1 4.4 4.9  NEUTROABS 3.3  --   --   HGB 9.6* 9.1* 9.5*  HCT 30.8* 28.9* 29.0*  MCV 114.1* 113.3* 109.8*  PLT 195 178 201   Basic Metabolic Panel: Recent Labs  Lab 08/29/24 1719 08/30/24 0417 09/02/24 0828 09/02/24 1848 09/03/24 0437  NA 136   < > 136 137 138  K 4.4   < > 3.7 3.6 3.3*  CL 96*   < > 94* 95* 95*  CO2 28   < > 31 33* 32  GLUCOSE 95   < > 105* 138* 146*  BUN 27*   < >  38* 39* 37*  CREATININE 1.71*   < > 2.01* 1.86* 1.82*  CALCIUM  8.4*   < > 8.8* 9.4 8.8*  MG 2.2  --   --   --  2.0   < > = values in this interval not displayed.   GFR: Estimated Creatinine Clearance: 29.3 mL/min (A) (by C-G formula based on SCr of 1.82 mg/dL (H)).   Scheduled Meds:  apixaban   5 mg Oral BID   Chlorhexidine  Gluconate Cloth  6 each Topical Daily   furosemide   80 mg Intravenous BID   metoprolol  succinate  50 mg Oral q AM   metoprolol  succinate  50 mg Oral QHS    multivitamin with minerals  1 tablet Oral Daily   pravastatin   20 mg Oral q1800   umeclidinium-vilanterol  1 puff Inhalation Daily   Continuous Infusions:  amiodarone  30 mg/hr (09/03/24 0701)     LOS: 5 days   Reyes IVAR Moores, MD [facility] Office  [phone] Pager - Text Page per Tracey  If 7PM-7AM, please contact night-coverage per Amion 09/03/2024, 9:17 AM     "

## 2024-09-03 NOTE — Plan of Care (Signed)
" °  Problem: Clinical Measurements: Goal: Respiratory complications will improve Outcome: Progressing Goal: Cardiovascular complication will be avoided Outcome: Progressing   Problem: Activity: Goal: Risk for activity intolerance will decrease Outcome: Progressing   Problem: Safety: Goal: Ability to remain free from injury will improve Outcome: Progressing   Problem: Cardiac: Goal: Ability to achieve and maintain adequate cardiopulmonary perfusion will improve Outcome: Progressing   Problem: Cardiac: Goal: Ability to achieve and maintain adequate cardiopulmonary perfusion will improve Outcome: Progressing   "

## 2024-09-03 NOTE — Plan of Care (Signed)
  Problem: Education: Goal: Knowledge of General Education information will improve Description: Including pain rating scale, medication(s)/side effects and non-pharmacologic comfort measures Outcome: Progressing   Problem: Health Behavior/Discharge Planning: Goal: Ability to manage health-related needs will improve Outcome: Progressing   Problem: Clinical Measurements: Goal: Ability to maintain clinical measurements within normal limits will improve Outcome: Progressing Goal: Will remain free from infection Outcome: Progressing Goal: Diagnostic test results will improve Outcome: Progressing Goal: Respiratory complications will improve Outcome: Progressing Goal: Cardiovascular complication will be avoided Outcome: Progressing   Problem: Activity: Goal: Risk for activity intolerance will decrease Outcome: Progressing   Problem: Nutrition: Goal: Adequate nutrition will be maintained Outcome: Progressing   Problem: Coping: Goal: Level of anxiety will decrease Outcome: Progressing   Problem: Elimination: Goal: Will not experience complications related to bowel motility Outcome: Progressing Goal: Will not experience complications related to urinary retention Outcome: Progressing   Problem: Pain Managment: Goal: General experience of comfort will improve and/or be controlled Outcome: Progressing   Problem: Safety: Goal: Ability to remain free from injury will improve Outcome: Progressing   Problem: Skin Integrity: Goal: Risk for impaired skin integrity will decrease Outcome: Progressing   Problem: Education: Goal: Ability to demonstrate management of disease process will improve Outcome: Progressing Goal: Ability to verbalize understanding of medication therapies will improve Outcome: Progressing Goal: Individualized Educational Video(s) Outcome: Progressing   Problem: Activity: Goal: Capacity to carry out activities will improve Outcome: Progressing    Problem: Cardiac: Goal: Ability to achieve and maintain adequate cardiopulmonary perfusion will improve Outcome: Progressing   Problem: Education: Goal: Knowledge of disease or condition will improve Outcome: Progressing Goal: Understanding of medication regimen will improve Outcome: Progressing Goal: Individualized Educational Video(s) Outcome: Progressing   Problem: Activity: Goal: Ability to tolerate increased activity will improve Outcome: Progressing   Problem: Cardiac: Goal: Ability to achieve and maintain adequate cardiopulmonary perfusion will improve Outcome: Progressing   Problem: Health Behavior/Discharge Planning: Goal: Ability to safely manage health-related needs after discharge will improve Outcome: Progressing

## 2024-09-03 NOTE — Progress Notes (Signed)
 "   Rounding Note    Patient Name: Monica Zamora Date of Encounter: 09/03/2024  [facility] Cardiologist: Redell Shallow, MD  Chief Complaint:  Chief Complaint  Patient presents with   Atrial Fibrillation  Reason of consult: Atrial fibrillation and congestive heart failure  Subjective   Patient seen and examined at bedside. Denies anginal chest pain. Shortness of breath improving Continues to cough, dyspneic, nasal cannula oxygen  Inpatient Medications    Scheduled Meds:  apixaban   5 mg Oral BID   Chlorhexidine  Gluconate Cloth  6 each Topical Daily   metoprolol  succinate  50 mg Oral q AM   metoprolol  succinate  50 mg Oral QHS   multivitamin with minerals  1 tablet Oral Daily   pravastatin   20 mg Oral q1800   [START ON 09/04/2024] torsemide   20 mg Oral q AM   umeclidinium-vilanterol  1 puff Inhalation Daily   Continuous Infusions:  amiodarone  30 mg/hr (09/03/24 0701)   PRN Meds: acetaminophen  **OR** [DISCONTINUED] acetaminophen , bisacodyl , diazepam , ipratropium-albuterol , ondansetron  **OR** ondansetron  (ZOFRAN ) IV, senna-docusate, traMADol    Vital Signs    Vitals:   09/03/24 1151 09/03/24 1422 09/03/24 1614 09/03/24 1616  BP: (!) 93/56 100/65    Pulse: 92 99    Resp: 17  18   Temp: 98.6 F (37 C)  98.4 F (36.9 C) 98.4 F (36.9 C)  TempSrc: Oral  Oral   SpO2: 93% 95%    Weight:      Height:        Intake/Output Summary (Last 24 hours) at 09/03/2024 1720 Last data filed at 09/03/2024 1334 Gross per 24 hour  Intake 614.48 ml  Output 1850 ml  Net -1235.52 ml      09/03/2024    4:10 AM 09/02/2024    4:45 AM 09/01/2024    4:42 AM  Last 3 Weights  Weight (lbs) 169 lb 12.1 oz 172 lb 2.9 oz 175 lb 4.3 oz  Weight (kg) 77 kg 78.1 kg 79.5 kg      Telemetry    Atrial fibrillation- Personally Reviewed  ECG    No new tracing- Personally Reviewed  Physical Exam   Physical Exam  Constitutional: No distress. She appears chronically  ill.  hemodynamically stable  HENT:  Nasal cannula oxygen  Cardiovascular: Normal rate, S1 normal and S2 normal. An irregularly irregular rhythm present. Exam reveals no gallop, no S3 and no S4.  No murmur heard. Pulmonary/Chest: Effort normal. No stridor. She has no wheezes. She has no rales.  Rhonchi's bilaterally  Musculoskeletal:        General: Edema present.     Cervical back: Neck supple.  Skin: Skin is warm.    Labs    High Sensitivity Troponin:   Recent Labs  Lab 08/29/24 1719 08/29/24 1831  TROPONINIHS 9 10     Chemistry Recent Labs  Lab 08/29/24 1719 08/30/24 0417 09/02/24 0828 09/02/24 1848 09/03/24 0437  NA 136   < > 136 137 138  K 4.4   < > 3.7 3.6 3.3*  CL 96*   < > 94* 95* 95*  CO2 28   < > 31 33* 32  GLUCOSE 95   < > 105* 138* 146*  BUN 27*   < > 38* 39* 37*  CREATININE 1.71*   < > 2.01* 1.86* 1.82*  CALCIUM  8.4*   < > 8.8* 9.4 8.8*  MG 2.2  --   --   --  2.0  PROT 6.0*  --   --   --   --  ALBUMIN 3.3*  --   --   --   --   AST 36  --   --   --   --   ALT 14  --   --   --   --   ALKPHOS 78  --   --   --   --   BILITOT 1.5*  --   --   --   --   GFRNONAA 31*   < > 26* 28* 29*  ANIONGAP 12   < > 11 10 11    < > = values in this interval not displayed.    Lipids No results for input(s): CHOL, TRIG, HDL, LABVLDL, LDLCALC, CHOLHDL in the last 168 hours.  Hematology Recent Labs  Lab 08/29/24 1719 08/30/24 0417 09/02/24 0828  WBC 5.1 4.4 4.9  RBC 2.70* 2.55* 2.64*  HGB 9.6* 9.1* 9.5*  HCT 30.8* 28.9* 29.0*  MCV 114.1* 113.3* 109.8*  MCH 35.6* 35.7* 36.0*  MCHC 31.2 31.5 32.8  RDW 15.1 14.8 15.1  PLT 195 178 201   Thyroid   Recent Labs  Lab 09/03/24 0437  TSH 4.650*    BNP Recent Labs  Lab 08/29/24 1719  BNP 2,397.9*    DDimer No results for input(s): DDIMER in the last 168 hours.   Radiology    N/A  Cardiac Studies   Echocardiogram November 2025:  1. Left ventricular ejection fraction, by estimation, is 20 to  25%. The  left ventricle has severely decreased function. The left ventricle  demonstrates global hypokinesis. Left ventricular diastolic function could  not be evaluated. Elevated left atrial  pressure.   2. Right ventricular systolic function is severely reduced. The right  ventricular size is normal. Tricuspid regurgitation signal is inadequate  for assessing PA pressure.   3. The mitral valve is degenerative. No evidence of mitral valve  regurgitation. No evidence of mitral stenosis.   4. The aortic valve is normal in structure. Aortic valve regurgitation is  not visualized. No aortic stenosis is present.   Comparison(s): Changes from prior study are noted. LV function has  worsened.   Patient Profile     73 y.o. female with a history of paroxysmal atrial fibrillation, hypertension, chronic hypoxemic respiratory failure on 2 L nasal cannula, CAD status post CABG in 2021, CAD, heart failure with reduced ejection fraction, CKD IIIa, hx of tobacco use.   Assessment & Plan    Persistent atrial fibrillation Presented with RVR, ventricular rate improving with IV amiodarone  Rate control: Metoprolol . Rhythm control: Amiodarone . Thromboembolic prophylaxis: Eliquis  Antiarrhythmic history: Multaq  Tikosyn considered but not ideal given CKD Would also avoid long-term use of amiodarone  given her underlying COPD, Dr. Zenaida and patient has had a very thorough discussion regarding this prior to initiating therapy Patient is aware that if amiodarone  is used long-term side effect profile needs to be monitored which involves imaging as well as labs. 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 Recent angiography noted patent grafts Patient was on Lasix  80 mg IV push twice daily, will transition to torsemide  20 mg p.o. daily Continue metoprolol  for now, consider bisoprolol given her history of COPD SGLT2i: Not ideal given  her frequent UTIs GDMT has been limited due to soft blood pressures and renal function  AKI on CKD stage IIIb Creatinine baseline speculative around 1.7-1.9 Creatinine trending down Avoid nephrotoxic agents Monitor BUN and creatinine  Coronary artery disease without angina pectoralis and prior surgical revascularization: History of CABG 2021 Recent angiography 2024: 3 out of 3 grafts patent Currently on beta-blockers, statin, Eliquis   Anemia: History of GI bleed Colonoscopy November 2025: Bleeding also treated with Hemoclip Monitor hemoglobin and hematocrit Risks, benefits, and alternatives to anticoagulation discussed   For questions or updates, please contact [facility] Please consult [email or web address] for contact info under   Medical Decision Making:  I have independently interpreted results of the EKG dated 08/29/2024, labs dated 09/03/2024 , Echocardiogram November 2025. Patient education provided at today's visit.   Since patient is on oral anticoagulation we have discuss the risk,benefit, and alternatives of oral anticoagulation.   Since patient is on antiarrhythmic drugs we discussed medication profile and follow up testing needed to monitor safety.   Prescription drug management.   I have independently formulated and discussed the assessment and plan with the patient. I have reviewed prior progress notes authored by Dr. Zenaida as part of today's visit.      Signed, Madonna Michele HAS, [facility]  A Division of [facility] [address], [place], [place] [address]  09/03/2024, 5:20 PM     "

## 2024-09-04 DIAGNOSIS — I4819 Other persistent atrial fibrillation: Secondary | ICD-10-CM | POA: Diagnosis not present

## 2024-09-04 DIAGNOSIS — I5023 Acute on chronic systolic (congestive) heart failure: Secondary | ICD-10-CM | POA: Diagnosis not present

## 2024-09-04 DIAGNOSIS — N179 Acute kidney failure, unspecified: Secondary | ICD-10-CM | POA: Diagnosis not present

## 2024-09-04 DIAGNOSIS — I2510 Atherosclerotic heart disease of native coronary artery without angina pectoris: Secondary | ICD-10-CM | POA: Diagnosis not present

## 2024-09-04 DIAGNOSIS — Z951 Presence of aortocoronary bypass graft: Secondary | ICD-10-CM | POA: Diagnosis not present

## 2024-09-04 LAB — BASIC METABOLIC PANEL WITH GFR
Anion gap: 9 (ref 5–15)
BUN: 37 mg/dL — ABNORMAL HIGH (ref 8–23)
CO2: 35 mmol/L — ABNORMAL HIGH (ref 22–32)
Calcium: 8.7 mg/dL — ABNORMAL LOW (ref 8.9–10.3)
Chloride: 94 mmol/L — ABNORMAL LOW (ref 98–111)
Creatinine, Ser: 1.93 mg/dL — ABNORMAL HIGH (ref 0.44–1.00)
GFR, Estimated: 27 mL/min — ABNORMAL LOW
Glucose, Bld: 105 mg/dL — ABNORMAL HIGH (ref 70–99)
Potassium: 3.4 mmol/L — ABNORMAL LOW (ref 3.5–5.1)
Sodium: 138 mmol/L (ref 135–145)

## 2024-09-04 LAB — CBC
HCT: 28.2 % — ABNORMAL LOW (ref 36.0–46.0)
Hemoglobin: 9.2 g/dL — ABNORMAL LOW (ref 12.0–15.0)
MCH: 35.4 pg — ABNORMAL HIGH (ref 26.0–34.0)
MCHC: 32.6 g/dL (ref 30.0–36.0)
MCV: 108.5 fL — ABNORMAL HIGH (ref 80.0–100.0)
Platelets: 186 K/uL (ref 150–400)
RBC: 2.6 MIL/uL — ABNORMAL LOW (ref 3.87–5.11)
RDW: 15.2 % (ref 11.5–15.5)
WBC: 4.7 K/uL (ref 4.0–10.5)
nRBC: 0 % (ref 0.0–0.2)

## 2024-09-04 LAB — MAGNESIUM: Magnesium: 2 mg/dL (ref 1.7–2.4)

## 2024-09-04 LAB — PHOSPHORUS: Phosphorus: 3.9 mg/dL (ref 2.5–4.6)

## 2024-09-04 MED ORDER — HYALURONIDASE HUMAN 150 UNIT/ML IJ SOLN
150.0000 [IU] | Freq: Once | INTRAMUSCULAR | Status: AC
  Administered 2024-09-04: 150 [IU] via SUBCUTANEOUS
  Filled 2024-09-04: qty 1

## 2024-09-04 MED ORDER — POTASSIUM CHLORIDE CRYS ER 20 MEQ PO TBCR
40.0000 meq | EXTENDED_RELEASE_TABLET | Freq: Once | ORAL | Status: AC
  Administered 2024-09-04: 40 meq via ORAL
  Filled 2024-09-04: qty 2

## 2024-09-04 MED ORDER — SODIUM CHLORIDE 0.9 % IV SOLN: INTRAVENOUS | Status: DC

## 2024-09-04 NOTE — H&P (View-Only) (Signed)
 "   Rounding Note    Patient Name: Monica Zamora Date of Encounter: 09/04/2024  [facility] Cardiologist: Redell Shallow, MD  Chief Complaint:  Chief Complaint  Patient presents with   Atrial Fibrillation  Reason of consult: Atrial fibrillation and congestive heart failure  Subjective   Patient seen and examined at bedside. Denies anginal chest pain. Shortness of breath improving Continues to cough, dyspneic, nasal cannula oxygen  Inpatient Medications    Scheduled Meds:  apixaban   5 mg Oral BID   Chlorhexidine  Gluconate Cloth  6 each Topical Daily   metoprolol  succinate  50 mg Oral q AM   multivitamin with minerals  1 tablet Oral Daily   pravastatin   20 mg Oral q1800   umeclidinium-vilanterol  1 puff Inhalation Daily   Continuous Infusions:  amiodarone  30 mg/hr (09/04/24 0731)   PRN Meds: acetaminophen  **OR** [DISCONTINUED] acetaminophen , bisacodyl , diazepam , ondansetron  **OR** ondansetron  (ZOFRAN ) IV, senna-docusate, traMADol    Vital Signs    Vitals:   09/04/24 0838 09/04/24 1050 09/04/24 1341 09/04/24 1401  BP: 101/60 (!) 103/54 (!) 86/62 116/69  Pulse: (!) 114 (!) 111 (!) 128   Resp: 18  (!) 22   Temp: (!) 97.4 F (36.3 C)  97.9 F (36.6 C)   TempSrc: Oral  Oral   SpO2: 93%  97% 91%  Weight:      Height:        Intake/Output Summary (Last 24 hours) at 09/04/2024 1551 Last data filed at 09/04/2024 0511 Gross per 24 hour  Intake 411.76 ml  Output 750 ml  Net -338.24 ml   Net IO Since Admission: -6,136.76 mL [09/04/24 1551]     09/04/2024    5:00 AM 09/03/2024    4:10 AM 09/02/2024    4:45 AM  Last 3 Weights  Weight (lbs) 170 lb 6.4 oz 169 lb 12.1 oz 172 lb 2.9 oz  Weight (kg) 77.293 kg 77 kg 78.1 kg      Telemetry    Atrial fibrillation- Personally Reviewed  ECG    No new tracing- Personally Reviewed  Physical Exam   Physical Exam  Constitutional: No distress. She appears chronically ill.  hemodynamically stable   HENT:  Nasal cannula oxygen  Cardiovascular: Normal rate, S1 normal and S2 normal. An irregularly irregular rhythm present. Exam reveals no gallop, no S3 and no S4.  No murmur heard. Pulmonary/Chest: Effort normal. No stridor. She has no wheezes. She has no rales.  Rhonchi's bilaterally  Musculoskeletal:        General: Edema present.     Cervical back: Neck supple.  Skin: Skin is warm.    Labs    High Sensitivity Troponin:   Recent Labs  Lab 08/29/24 1719 08/29/24 1831  TROPONINIHS 9 10     Chemistry Recent Labs  Lab 08/29/24 1719 08/30/24 0417 09/02/24 1848 09/03/24 0437 09/04/24 0211  NA 136   < > 137 138 138  K 4.4   < > 3.6 3.3* 3.4*  CL 96*   < > 95* 95* 94*  CO2 28   < > 33* 32 35*  GLUCOSE 95   < > 138* 146* 105*  BUN 27*   < > 39* 37* 37*  CREATININE 1.71*   < > 1.86* 1.82* 1.93*  CALCIUM  8.4*   < > 9.4 8.8* 8.7*  MG 2.2  --   --  2.0 2.0  PROT 6.0*  --   --   --   --  ALBUMIN 3.3*  --   --   --   --   AST 36  --   --   --   --   ALT 14  --   --   --   --   ALKPHOS 78  --   --   --   --   BILITOT 1.5*  --   --   --   --   GFRNONAA 31*   < > 28* 29* 27*  ANIONGAP 12   < > 10 11 9    < > = values in this interval not displayed.    Lipids No results for input(s): CHOL, TRIG, HDL, LABVLDL, LDLCALC, CHOLHDL in the last 168 hours.  Hematology Recent Labs  Lab 08/30/24 0417 09/02/24 0828 09/04/24 0211  WBC 4.4 4.9 4.7  RBC 2.55* 2.64* 2.60*  HGB 9.1* 9.5* 9.2*  HCT 28.9* 29.0* 28.2*  MCV 113.3* 109.8* 108.5*  MCH 35.7* 36.0* 35.4*  MCHC 31.5 32.8 32.6  RDW 14.8 15.1 15.2  PLT 178 201 186   Thyroid   Recent Labs  Lab 09/03/24 0437  TSH 4.650*    BNP Recent Labs  Lab 08/29/24 1719  BNP 2,397.9*    DDimer No results for input(s): DDIMER in the last 168 hours.   Radiology    N/A  Cardiac Studies   Echocardiogram November 2025:  1. Left ventricular ejection fraction, by estimation, is 20 to 25%. The  left ventricle has  severely decreased function. The left ventricle  demonstrates global hypokinesis. Left ventricular diastolic function could  not be evaluated. Elevated left atrial  pressure.   2. Right ventricular systolic function is severely reduced. The right  ventricular size is normal. Tricuspid regurgitation signal is inadequate  for assessing PA pressure.   3. The mitral valve is degenerative. No evidence of mitral valve  regurgitation. No evidence of mitral stenosis.   4. The aortic valve is normal in structure. Aortic valve regurgitation is  not visualized. No aortic stenosis is present.   Comparison(s): Changes from prior study are noted. LV function has  worsened.   Patient Profile     73 y.o. female with a history of paroxysmal atrial fibrillation, hypertension, chronic hypoxemic respiratory failure on 2 L nasal cannula, CAD status post CABG in 2021, CAD, heart failure with reduced ejection fraction, CKD IIIa, hx of tobacco use.   Assessment & Plan    Persistent atrial fibrillation Presented with RVR, ventricular rate improving with IV amiodarone  Rate control: Metoprolol . Rhythm control: Amiodarone . Thromboembolic prophylaxis: Eliquis  Antiarrhythmic history: Multaq  Tikosyn considered but not ideal given CKD Would also avoid long-term use of amiodarone  given her underlying COPD, Dr. Zenaida and patient has had a very thorough discussion regarding this prior to initiating therapy Patient is aware that if amiodarone  is used long-term side effect profile needs to be monitored which involves imaging as well as labs. Since scheduled for TEE guided cardioversion 09/05/2024, questions and concerns were addressed to her satisfaction.  Acute on chronic biventricular heart failure Stage C, NYHA class II Clinically in congestive heart failure on arrival BNP 2397 Despite a low LVEF physical examination and laboratory findings not suggestive of cardiogenic shock Echo 2024: LVEF 50 to 55% Echo  November 2025: LVEF 20-25%, right ventricular function severely reduced Cardiomyopathy likely secondary to tachycardia induced. Recent angiography noted patent grafts Net IO Since Admission: -6,136.76 mL [09/04/24 1551] Transition to torsemide  20 mg p.o. daily, hold secondary renal function. Continue metoprolol  for now, consider bisoprolol  given her history of COPD SGLT2i: Not ideal given her frequent UTIs GDMT has been limited due to soft blood pressures and renal function - will focus on volume management.  Hopefully can uptitrate GDMT once rhythm is restored.  AKI on CKD stage IIIb Creatinine baseline speculative around 1.7-1.9 Will hold torsemide  for now -to avoid worsening renal function prior to upcoming TEE cardioversion. Avoid nephrotoxic agents Monitor BUN and creatinine  Coronary artery disease without angina pectoralis and prior surgical revascularization: History of CABG 2021 Recent angiography 2024: 3 out of 3 grafts patent Currently on beta-blockers, statin, Eliquis   Anemia: History of GI bleed Colonoscopy November 2025: Bleeding also treated with Hemoclip Monitor hemoglobin and hematocrit Risks, benefits, and alternatives to anticoagulation discussed   For questions or updates, please contact [facility] Please consult [email or web address] for contact info under   Signed, Madonna Michele HAS, [facility]  A Division of [facility] [address], [place], [place] [address]  09/04/2024, 3:51 PM     "

## 2024-09-04 NOTE — Progress Notes (Signed)
 "  Monica Zamora  FMW:[id number] DOB: 1951-06-19 DOA: 08/29/2024 PCP: [name], NP    Brief Narrative:  73 year old with a history of atrial fibrillation, CHF, CAD, HTN, and CKD who presented to the ER 12/15 on referral from her cardiologist office where she presented with generalized fatigue, DOE, lower extremity edema, and a sense of palpitations and was found to be in A-fib with RVR again.  She had been hospitalized 11/12-18/25 for atrial fibrillation with RVR and was discharged at that time on metoprolol  and apixaban .  Goals of Care:   Code Status: Full Code   DVT prophylaxis: Place TED hose Start: 08/29/24 1951 apixaban  (ELIQUIS ) tablet 5 mg   Interim Hx: No acute events recorded overnight.  Afebrile.  Heart rate 83-125 in atrial fibrillation.  Blood pressure 74-108 systolic.  Requiring 2 L nasal cannula support to keep saturation is 93-98%.  Resting comfortably in bed at the time of my visit.  In good spirits.  Denies any new complaints.  Assessment & Plan:  Atrial fibrillation with acute RVR Care per Cardiology -continue Eliquis  - history of prior DCCV -amiodarone  initiated with plans for TEE/DCCV 12/22 -magnesium  normal -TSH not low  Acute exacerbation of chronic systolic congestive heart failure with right heart failure TTE noted EF 20-25% with global hypokinesis and severe RV systolic failure -advanced heart failure team following - decline in EF recently felt to be rate related   Filed Weights   09/02/24 0445 09/03/24 0410 09/04/24 0500  Weight: 78.1 kg 77 kg 77.3 kg    CAD status post CABG 2021 Asymptomatic at present - 3 of 3 grafts patent on cardiac cath 2024 - continue statin  Macrocytic anemia History of GI bleed November 2025 due to bleeding ulcer with Hemoclip - hemoglobin stable at present  Hypokalemia Due to diuresis and poor intake - supplement to goal of 4.0 - magnesium  is normal  HTN BP well-controlled  AKI on CKD stage IIIb Creatinine  stable at present - baseline creatinine likely approximately 1.3 - suspect worsening indicative of hypoperfusion in setting of CHF and RVR   COPD Well compensated at present  PVD Not on antiplatelet therapy due to use of apixaban  -continue statin  Depression/anxiety Continue usual diazepam  dose   Family Communication: no family present at time of exam Disposition:   Home Health Pt12/19/2025 1235  Objective: Blood pressure 101/60, pulse (!) 114, temperature (!) 97.4 F (36.3 C), temperature source Oral, resp. rate 18, height 5' 6 (1.676 m), weight 77.3 kg, SpO2 93%.  Intake/Output Summary (Last 24 hours) at 09/04/2024 0935 Last data filed at 09/04/2024 0511 Gross per 24 hour  Intake 411.76 ml  Output 1250 ml  Net -838.24 ml   Filed Weights   09/02/24 0445 09/03/24 0410 09/04/24 0500  Weight: 78.1 kg 77 kg 77.3 kg    Examination: General: No acute respiratory distress Lungs: Clear to auscultation bilaterally without wheezes or crackles Cardiovascular: irreg irreg - no appreciable M -heart rate approximately 105 at time of visit Abdomen: Nontender, nondistended, soft, bowel sounds positive, no rebound, no ascites, no appreciable mass Extremities: Trace bilateral lower extremity edema  CBC: Recent Labs  Lab 08/29/24 1719 08/30/24 0417 09/02/24 0828 09/04/24 0211  WBC 5.1 4.4 4.9 4.7  NEUTROABS 3.3  --   --   --   HGB 9.6* 9.1* 9.5* 9.2*  HCT 30.8* 28.9* 29.0* 28.2*  MCV 114.1* 113.3* 109.8* 108.5*  PLT 195 178 201 186   Basic Metabolic Panel: Recent Labs  Lab 08/29/24 1719 08/30/24 0417 09/02/24 1848 09/03/24 0437 09/04/24 0211  NA 136   < > 137 138 138  K 4.4   < > 3.6 3.3* 3.4*  CL 96*   < > 95* 95* 94*  CO2 28   < > 33* 32 35*  GLUCOSE 95   < > 138* 146* 105*  BUN 27*   < > 39* 37* 37*  CREATININE 1.71*   < > 1.86* 1.82* 1.93*  CALCIUM  8.4*   < > 9.4 8.8* 8.7*  MG 2.2  --   --  2.0 2.0  PHOS  --   --   --   --  3.9   < > = values in this interval  not displayed.   GFR: Estimated Creatinine Clearance: 27.7 mL/min (A) (by C-G formula based on SCr of 1.93 mg/dL (H)).   Scheduled Meds:  apixaban   5 mg Oral BID   Chlorhexidine  Gluconate Cloth  6 each Topical Daily   metoprolol  succinate  50 mg Oral q AM   metoprolol  succinate  50 mg Oral QHS   multivitamin with minerals  1 tablet Oral Daily   pravastatin   20 mg Oral q1800   torsemide   20 mg Oral q AM   umeclidinium-vilanterol  1 puff Inhalation Daily   Continuous Infusions:  amiodarone  30 mg/hr (09/04/24 0731)     LOS: 6 days   Reyes IVAR Moores, MD [facility] Office  [phone] Pager - Text Page per Tracey  If 7PM-7AM, please contact night-coverage per Amion 09/04/2024, 9:35 AM     "

## 2024-09-04 NOTE — Progress Notes (Signed)
 During bedside report, patient's right arm IV noted to be edematous, pink, tender, bleeding at site and firm cord palpated above site.  IV amiodarone  stopped and IV team consult placed for evaluation and IV restart.  Pharmacy and MD notified.  Orders received.

## 2024-09-04 NOTE — Plan of Care (Signed)
" °  Problem: Clinical Measurements: Goal: Respiratory complications will improve Outcome: Progressing Goal: Cardiovascular complication will be avoided Outcome: Progressing   Problem: Activity: Goal: Risk for activity intolerance will decrease Outcome: Progressing   Problem: Nutrition: Goal: Adequate nutrition will be maintained Outcome: Progressing   Problem: Pain Managment: Goal: General experience of comfort will improve and/or be controlled Outcome: Progressing   Problem: Safety: Goal: Ability to remain free from injury will improve Outcome: Progressing   Problem: Activity: Goal: Capacity to carry out activities will improve Outcome: Progressing   Problem: Cardiac: Goal: Ability to achieve and maintain adequate cardiopulmonary perfusion will improve Outcome: Progressing   Problem: Activity: Goal: Ability to tolerate increased activity will improve Outcome: Progressing   Problem: Cardiac: Goal: Ability to achieve and maintain adequate cardiopulmonary perfusion will improve Outcome: Progressing   "

## 2024-09-04 NOTE — Progress Notes (Signed)
 Trizia ([employer]) RN held 0700 AM dose metoprolol  because BP=92/66. Patient now 103/54 HR is 114.  Reached out to Dr Danton he stated ok to give dose now.

## 2024-09-04 NOTE — Progress Notes (Signed)
 "   Rounding Note    Patient Name: Monica Zamora Date of Encounter: 09/04/2024  [facility] Cardiologist: Redell Shallow, MD  Chief Complaint:  Chief Complaint  Patient presents with   Atrial Fibrillation  Reason of consult: Atrial fibrillation and congestive heart failure  Subjective   Patient seen and examined at bedside. Denies anginal chest pain. Shortness of breath improving Continues to cough, dyspneic, nasal cannula oxygen  Inpatient Medications    Scheduled Meds:  apixaban   5 mg Oral BID   Chlorhexidine  Gluconate Cloth  6 each Topical Daily   metoprolol  succinate  50 mg Oral q AM   multivitamin with minerals  1 tablet Oral Daily   pravastatin   20 mg Oral q1800   umeclidinium-vilanterol  1 puff Inhalation Daily   Continuous Infusions:  amiodarone  30 mg/hr (09/04/24 0731)   PRN Meds: acetaminophen  **OR** [DISCONTINUED] acetaminophen , bisacodyl , diazepam , ondansetron  **OR** ondansetron  (ZOFRAN ) IV, senna-docusate, traMADol    Vital Signs    Vitals:   09/04/24 0838 09/04/24 1050 09/04/24 1341 09/04/24 1401  BP: 101/60 (!) 103/54 (!) 86/62 116/69  Pulse: (!) 114 (!) 111 (!) 128   Resp: 18  (!) 22   Temp: (!) 97.4 F (36.3 C)  97.9 F (36.6 C)   TempSrc: Oral  Oral   SpO2: 93%  97% 91%  Weight:      Height:        Intake/Output Summary (Last 24 hours) at 09/04/2024 1551 Last data filed at 09/04/2024 0511 Gross per 24 hour  Intake 411.76 ml  Output 750 ml  Net -338.24 ml   Net IO Since Admission: -6,136.76 mL [09/04/24 1551]     09/04/2024    5:00 AM 09/03/2024    4:10 AM 09/02/2024    4:45 AM  Last 3 Weights  Weight (lbs) 170 lb 6.4 oz 169 lb 12.1 oz 172 lb 2.9 oz  Weight (kg) 77.293 kg 77 kg 78.1 kg      Telemetry    Atrial fibrillation- Personally Reviewed  ECG    No new tracing- Personally Reviewed  Physical Exam   Physical Exam  Constitutional: No distress. She appears chronically ill.  hemodynamically stable   HENT:  Nasal cannula oxygen  Cardiovascular: Normal rate, S1 normal and S2 normal. An irregularly irregular rhythm present. Exam reveals no gallop, no S3 and no S4.  No murmur heard. Pulmonary/Chest: Effort normal. No stridor. She has no wheezes. She has no rales.  Rhonchi's bilaterally  Musculoskeletal:        General: Edema present.     Cervical back: Neck supple.  Skin: Skin is warm.    Labs    High Sensitivity Troponin:   Recent Labs  Lab 08/29/24 1719 08/29/24 1831  TROPONINIHS 9 10     Chemistry Recent Labs  Lab 08/29/24 1719 08/30/24 0417 09/02/24 1848 09/03/24 0437 09/04/24 0211  NA 136   < > 137 138 138  K 4.4   < > 3.6 3.3* 3.4*  CL 96*   < > 95* 95* 94*  CO2 28   < > 33* 32 35*  GLUCOSE 95   < > 138* 146* 105*  BUN 27*   < > 39* 37* 37*  CREATININE 1.71*   < > 1.86* 1.82* 1.93*  CALCIUM  8.4*   < > 9.4 8.8* 8.7*  MG 2.2  --   --  2.0 2.0  PROT 6.0*  --   --   --   --  ALBUMIN 3.3*  --   --   --   --   AST 36  --   --   --   --   ALT 14  --   --   --   --   ALKPHOS 78  --   --   --   --   BILITOT 1.5*  --   --   --   --   GFRNONAA 31*   < > 28* 29* 27*  ANIONGAP 12   < > 10 11 9    < > = values in this interval not displayed.    Lipids No results for input(s): CHOL, TRIG, HDL, LABVLDL, LDLCALC, CHOLHDL in the last 168 hours.  Hematology Recent Labs  Lab 08/30/24 0417 09/02/24 0828 09/04/24 0211  WBC 4.4 4.9 4.7  RBC 2.55* 2.64* 2.60*  HGB 9.1* 9.5* 9.2*  HCT 28.9* 29.0* 28.2*  MCV 113.3* 109.8* 108.5*  MCH 35.7* 36.0* 35.4*  MCHC 31.5 32.8 32.6  RDW 14.8 15.1 15.2  PLT 178 201 186   Thyroid   Recent Labs  Lab 09/03/24 0437  TSH 4.650*    BNP Recent Labs  Lab 08/29/24 1719  BNP 2,397.9*    DDimer No results for input(s): DDIMER in the last 168 hours.   Radiology    N/A  Cardiac Studies   Echocardiogram November 2025:  1. Left ventricular ejection fraction, by estimation, is 20 to 25%. The  left ventricle has  severely decreased function. The left ventricle  demonstrates global hypokinesis. Left ventricular diastolic function could  not be evaluated. Elevated left atrial  pressure.   2. Right ventricular systolic function is severely reduced. The right  ventricular size is normal. Tricuspid regurgitation signal is inadequate  for assessing PA pressure.   3. The mitral valve is degenerative. No evidence of mitral valve  regurgitation. No evidence of mitral stenosis.   4. The aortic valve is normal in structure. Aortic valve regurgitation is  not visualized. No aortic stenosis is present.   Comparison(s): Changes from prior study are noted. LV function has  worsened.   Patient Profile     73 y.o. female with a history of paroxysmal atrial fibrillation, hypertension, chronic hypoxemic respiratory failure on 2 L nasal cannula, CAD status post CABG in 2021, CAD, heart failure with reduced ejection fraction, CKD IIIa, hx of tobacco use.   Assessment & Plan    Persistent atrial fibrillation Presented with RVR, ventricular rate improving with IV amiodarone  Rate control: Metoprolol . Rhythm control: Amiodarone . Thromboembolic prophylaxis: Eliquis  Antiarrhythmic history: Multaq  Tikosyn considered but not ideal given CKD Would also avoid long-term use of amiodarone  given her underlying COPD, Dr. Zenaida and patient has had a very thorough discussion regarding this prior to initiating therapy Patient is aware that if amiodarone  is used long-term side effect profile needs to be monitored which involves imaging as well as labs. Since scheduled for TEE guided cardioversion 09/05/2024, questions and concerns were addressed to her satisfaction.  Acute on chronic biventricular heart failure Stage C, NYHA class II Clinically in congestive heart failure on arrival BNP 2397 Despite a low LVEF physical examination and laboratory findings not suggestive of cardiogenic shock Echo 2024: LVEF 50 to 55% Echo  November 2025: LVEF 20-25%, right ventricular function severely reduced Cardiomyopathy likely secondary to tachycardia induced. Recent angiography noted patent grafts Net IO Since Admission: -6,136.76 mL [09/04/24 1551] Transition to torsemide  20 mg p.o. daily, hold secondary renal function. Continue metoprolol  for now, consider bisoprolol  given her history of COPD SGLT2i: Not ideal given her frequent UTIs GDMT has been limited due to soft blood pressures and renal function - will focus on volume management.  Hopefully can uptitrate GDMT once rhythm is restored.  AKI on CKD stage IIIb Creatinine baseline speculative around 1.7-1.9 Will hold torsemide  for now -to avoid worsening renal function prior to upcoming TEE cardioversion. Avoid nephrotoxic agents Monitor BUN and creatinine  Coronary artery disease without angina pectoralis and prior surgical revascularization: History of CABG 2021 Recent angiography 2024: 3 out of 3 grafts patent Currently on beta-blockers, statin, Eliquis   Anemia: History of GI bleed Colonoscopy November 2025: Bleeding also treated with Hemoclip Monitor hemoglobin and hematocrit Risks, benefits, and alternatives to anticoagulation discussed   For questions or updates, please contact [facility] Please consult [email or web address] for contact info under   Signed, Madonna Michele HAS, [facility]  A Division of [facility] [address], [place], [place] [address]  09/04/2024, 3:51 PM     "

## 2024-09-05 ENCOUNTER — Other Ambulatory Visit: Payer: Self-pay

## 2024-09-05 ENCOUNTER — Inpatient Hospital Stay (HOSPITAL_COMMUNITY)

## 2024-09-05 ENCOUNTER — Encounter (HOSPITAL_COMMUNITY): Admission: EM | Disposition: E | Payer: Self-pay | Source: Home / Self Care | Attending: Critical Care Medicine

## 2024-09-05 ENCOUNTER — Inpatient Hospital Stay (HOSPITAL_COMMUNITY): Admitting: Certified Registered Nurse Anesthetist

## 2024-09-05 DIAGNOSIS — N179 Acute kidney failure, unspecified: Secondary | ICD-10-CM | POA: Diagnosis not present

## 2024-09-05 DIAGNOSIS — I4891 Unspecified atrial fibrillation: Secondary | ICD-10-CM

## 2024-09-05 DIAGNOSIS — I5082 Biventricular heart failure: Secondary | ICD-10-CM | POA: Diagnosis not present

## 2024-09-05 DIAGNOSIS — I4819 Other persistent atrial fibrillation: Secondary | ICD-10-CM | POA: Diagnosis not present

## 2024-09-05 DIAGNOSIS — I2510 Atherosclerotic heart disease of native coronary artery without angina pectoris: Secondary | ICD-10-CM | POA: Diagnosis not present

## 2024-09-05 DIAGNOSIS — I5023 Acute on chronic systolic (congestive) heart failure: Secondary | ICD-10-CM | POA: Diagnosis not present

## 2024-09-05 DIAGNOSIS — I5043 Acute on chronic combined systolic (congestive) and diastolic (congestive) heart failure: Secondary | ICD-10-CM | POA: Diagnosis not present

## 2024-09-05 DIAGNOSIS — Z87891 Personal history of nicotine dependence: Secondary | ICD-10-CM | POA: Diagnosis not present

## 2024-09-05 DIAGNOSIS — I110 Hypertensive heart disease with heart failure: Secondary | ICD-10-CM | POA: Diagnosis not present

## 2024-09-05 DIAGNOSIS — I340 Nonrheumatic mitral (valve) insufficiency: Secondary | ICD-10-CM | POA: Diagnosis not present

## 2024-09-05 HISTORY — PX: CARDIOVERSION: EP1203

## 2024-09-05 HISTORY — PX: TRANSESOPHAGEAL ECHOCARDIOGRAM (CATH LAB): EP1270

## 2024-09-05 LAB — CBC
HCT: 35.5 % — ABNORMAL LOW (ref 36.0–46.0)
Hemoglobin: 11.3 g/dL — ABNORMAL LOW (ref 12.0–15.0)
MCH: 35.2 pg — ABNORMAL HIGH (ref 26.0–34.0)
MCHC: 31.8 g/dL (ref 30.0–36.0)
MCV: 110.6 fL — ABNORMAL HIGH (ref 80.0–100.0)
Platelets: 249 K/uL (ref 150–400)
RBC: 3.21 MIL/uL — ABNORMAL LOW (ref 3.87–5.11)
RDW: 15.4 % (ref 11.5–15.5)
WBC: 7.9 K/uL (ref 4.0–10.5)
nRBC: 0 % (ref 0.0–0.2)

## 2024-09-05 LAB — BASIC METABOLIC PANEL WITH GFR
Anion gap: 11 (ref 5–15)
Anion gap: 18 — ABNORMAL HIGH (ref 5–15)
BUN: 34 mg/dL — ABNORMAL HIGH (ref 8–23)
BUN: 37 mg/dL — ABNORMAL HIGH (ref 8–23)
CO2: 32 mmol/L (ref 22–32)
CO2: 24 mmol/L (ref 22–32)
Calcium: 8.8 mg/dL — ABNORMAL LOW (ref 8.9–10.3)
Calcium: 9.4 mg/dL (ref 8.9–10.3)
Chloride: 95 mmol/L — ABNORMAL LOW (ref 98–111)
Chloride: 93 mmol/L — ABNORMAL LOW (ref 98–111)
Creatinine, Ser: 1.46 mg/dL — ABNORMAL HIGH (ref 0.44–1.00)
Creatinine, Ser: 2.01 mg/dL — ABNORMAL HIGH (ref 0.44–1.00)
GFR, Estimated: 38 mL/min — ABNORMAL LOW
GFR, Estimated: 26 mL/min — ABNORMAL LOW
Glucose, Bld: 94 mg/dL (ref 70–99)
Glucose, Bld: 103 mg/dL — ABNORMAL HIGH (ref 70–99)
Potassium: 3.5 mmol/L (ref 3.5–5.1)
Potassium: 6.1 mmol/L — ABNORMAL HIGH (ref 3.5–5.1)
Sodium: 138 mmol/L (ref 135–145)
Sodium: 135 mmol/L (ref 135–145)

## 2024-09-05 LAB — HEPATIC FUNCTION PANEL
ALT: 81 U/L — ABNORMAL HIGH (ref 0–44)
AST: 194 U/L — ABNORMAL HIGH (ref 15–41)
Albumin: 3.8 g/dL (ref 3.5–5.0)
Alkaline Phosphatase: 288 U/L — ABNORMAL HIGH (ref 38–126)
Bilirubin, Direct: 0.9 mg/dL — ABNORMAL HIGH (ref 0.0–0.2)
Indirect Bilirubin: 0.7 mg/dL (ref 0.3–0.9)
Total Bilirubin: 1.5 mg/dL — ABNORMAL HIGH (ref 0.0–1.2)
Total Protein: 7 g/dL (ref 6.5–8.1)

## 2024-09-05 LAB — ECHO TEE

## 2024-09-05 LAB — LACTIC ACID, PLASMA: Lactic Acid, Venous: 5.4 mmol/L (ref 0.5–1.9)

## 2024-09-05 LAB — TROPONIN T, HIGH SENSITIVITY: Troponin T High Sensitivity: 27 ng/L — ABNORMAL HIGH (ref 0–19)

## 2024-09-05 LAB — PRO BRAIN NATRIURETIC PEPTIDE: Pro Brain Natriuretic Peptide: 19057 pg/mL — ABNORMAL HIGH

## 2024-09-05 MED ORDER — PHENOL 1.4 % MT LIQD
1.0000 | OROMUCOSAL | Status: DC | PRN
  Administered 2024-09-05: 1 via OROMUCOSAL
  Filled 2024-09-05: qty 177

## 2024-09-05 MED ORDER — MENTHOL 3 MG MT LOZG
1.0000 | LOZENGE | OROMUCOSAL | Status: DC | PRN
  Administered 2024-09-05: 3 mg via ORAL
  Filled 2024-09-05: qty 9

## 2024-09-05 MED ORDER — PROPOFOL 10 MG/ML IV BOLUS
INTRAVENOUS | Status: DC | PRN
  Administered 2024-09-05: 10 mg via INTRAVENOUS
  Administered 2024-09-05: 15 mg via INTRAVENOUS
  Administered 2024-09-05: 10 mg via INTRAVENOUS

## 2024-09-05 MED ORDER — TORSEMIDE 20 MG PO TABS
20.0000 mg | ORAL_TABLET | Freq: Every day | ORAL | Status: DC
  Administered 2024-09-05: 20 mg via ORAL
  Filled 2024-09-05: qty 1

## 2024-09-05 MED ORDER — POTASSIUM CHLORIDE CRYS ER 20 MEQ PO TBCR
60.0000 meq | EXTENDED_RELEASE_TABLET | Freq: Once | ORAL | Status: AC
  Administered 2024-09-05: 60 meq via ORAL
  Filled 2024-09-05: qty 3

## 2024-09-05 MED ORDER — ETOMIDATE 2 MG/ML IV SOLN
INTRAVENOUS | Status: DC | PRN
  Administered 2024-09-05: 4 mg via INTRAVENOUS

## 2024-09-05 MED ORDER — EPHEDRINE SULFATE-NACL 50-0.9 MG/10ML-% IV SOSY
PREFILLED_SYRINGE | INTRAVENOUS | Status: DC | PRN
  Administered 2024-09-05: 5 mg via INTRAVENOUS

## 2024-09-05 MED ORDER — MELATONIN 3 MG PO TABS
3.0000 mg | ORAL_TABLET | Freq: Once | ORAL | Status: AC
  Administered 2024-09-05: 3 mg via ORAL
  Filled 2024-09-05: qty 1

## 2024-09-05 MED ORDER — SODIUM ZIRCONIUM CYCLOSILICATE 10 G PO PACK
10.0000 g | PACK | Freq: Once | ORAL | Status: AC
  Administered 2024-09-05: 10 g via ORAL
  Filled 2024-09-05: qty 1

## 2024-09-05 MED ORDER — CALCIUM GLUCONATE-NACL 1-0.675 GM/50ML-% IV SOLN
1.0000 g | Freq: Once | INTRAVENOUS | Status: AC
  Administered 2024-09-05: 1000 mg via INTRAVENOUS
  Filled 2024-09-05: qty 50

## 2024-09-05 MED ORDER — PROPOFOL 500 MG/50ML IV EMUL
INTRAVENOUS | Status: DC | PRN
  Administered 2024-09-05: 25 ug/kg/min via INTRAVENOUS

## 2024-09-05 NOTE — Telephone Encounter (Signed)
 Still admitted, scheduled for cardioversion today.

## 2024-09-05 NOTE — Anesthesia Preprocedure Evaluation (Signed)
 "                                  Anesthesia Evaluation  Patient identified by MRN, date of birth, ID band Patient awake    Reviewed: Allergy & Precautions, NPO status , Patient's Chart, lab work & pertinent test results  History of Anesthesia Complications Negative for: history of anesthetic complications  Airway Mallampati: III  TM Distance: >3 FB Neck ROM: Full    Dental  (+) Dental Advisory Given, Edentulous Upper, Edentulous Lower   Pulmonary shortness of breath, COPD,  COPD inhaler and oxygen dependent, former smoker    + decreased breath sounds      Cardiovascular Exercise Tolerance: Poor hypertension, Pt. on home beta blockers and Pt. on medications + CAD, + CABG, + Peripheral Vascular Disease and +CHF  + dysrhythmias Atrial Fibrillation  Rhythm:Irregular   1. Left ventricular ejection fraction, by estimation, is 20 to 25%. The  left ventricle has severely decreased function. The left ventricle  demonstrates global hypokinesis. Left ventricular diastolic function could  not be evaluated. Elevated left atrial  pressure.   2. Right ventricular systolic function is severely reduced. The right  ventricular size is normal. Tricuspid regurgitation signal is inadequate  for assessing PA pressure.   3. The mitral valve is degenerative. No evidence of mitral valve  regurgitation. No evidence of mitral stenosis.   4. The aortic valve is normal in structure. Aortic valve regurgitation is  not visualized. No aortic stenosis is present.      Neuro/Psych neg Seizures PSYCHIATRIC DISORDERS Anxiety Depression    negative neurological ROS     GI/Hepatic Neg liver ROS, PUD,,,  Endo/Other  negative endocrine ROS    Renal/GU Renal InsufficiencyRenal diseaseLab Results      Component                Value               Date                      NA                       138                 09/05/2024                K                        3.5                  09/05/2024                CO2                      32                  09/05/2024                GLUCOSE                  94                  09/05/2024                BUN  34 (H)              09/05/2024                CREATININE               1.46 (H)            09/05/2024                CALCIUM                   8.8 (L)             09/05/2024                EGFR                     40 (L)              08/23/2024                GFRNONAA                 38 (L)              09/05/2024                Musculoskeletal negative musculoskeletal ROS (+)    Abdominal   Peds  Hematology  (+) Blood dyscrasia, anemia Lab Results      Component                Value               Date                      WBC                      4.7                 09/04/2024                HGB                      9.2 (L)             09/04/2024                HCT                      28.2 (L)            09/04/2024                MCV                      108.5 (H)           09/04/2024                PLT                      186                 09/04/2024              Anesthesia Other Findings   Reproductive/Obstetrics                              Anesthesia Physical  Anesthesia Plan  ASA: 4  Anesthesia Plan: MAC and General   Post-op Pain Management: Minimal or no pain anticipated   Induction: Intravenous  PONV Risk Score and Plan: 3 and Treatment may vary due to age or medical condition  Airway Management Planned: Natural Airway, Nasal Cannula and Simple Face Mask  Additional Equipment: None  Intra-op Plan:   Post-operative Plan:   Informed Consent: I have reviewed the patients History and Physical, chart, labs and discussed the procedure including the risks, benefits and alternatives for the proposed anesthesia with the patient or authorized representative who has indicated his/her understanding and acceptance.     Dental advisory given  Plan  Discussed with: CRNA  Anesthesia Plan Comments:          Anesthesia Quick Evaluation  "

## 2024-09-05 NOTE — Interval H&P Note (Signed)
 History and Physical Interval Note:  09/05/2024 7:42 AM  Monica Zamora  has presented today for surgery, with the diagnosis of afib.  The various methods of treatment have been discussed with the patient and family. After consideration of risks, benefits and other options for treatment, the patient has consented to  Procedures: TRANSESOPHAGEAL ECHOCARDIOGRAM (N/A) CARDIOVERSION (N/A) as a surgical intervention.  The patient's history has been reviewed, patient examined, no change in status, stable for surgery.  I have reviewed the patient's chart and labs.  Questions were answered to the patient's satisfaction.     Kimmy Totten

## 2024-09-05 NOTE — Progress Notes (Signed)
 Pt has been c/o not feeling well since being back from her dccv/tee. Pt's vs are stable for the most part but temp is slightly low & respiratory rate slightly up. Pt was c/o lightheadedness, sob (but pulse ox 100 %) on  L, lungs have crackles on the lt side & diminished on the rt, & pt also c/o nausea. Pt was given zofran  prn. Will continue to monitor the pt. Monica Zamora

## 2024-09-05 NOTE — Progress Notes (Incomplete)
 eLink Physician-Brief Progress Note Patient Name: Monica Zamora DOB: 1950/09/26 MRN: [id number]   Date of Service  09/05/2024  HPI/Events of Note  Patient with a history of ischemic cardiomyopathy status post CABG in 2021 complicated by heart failure with reduced ejection fraction, A-fib, and COPD who presents as a transfer to the ICU and undifferentiated shock.  Vitals, labs, and imaging reviewed.  Patient has biventricular failure with evidence of new acute kidney injury, hyperkalemia, and resolving lactic acidosis.  eICU Interventions  Initiate dobutamine , hold beta-blocker Additional diuresis Hyperkalemia protocol administered  DVT prophylaxis with apixaban  GI prophylaxis not indicated   0304 -patient is on Lasix  infusion along with dobutamine , feels the urge to urinate but not interested in using bedpan or pure wick.  Wishes to have her Foley replaced.  No clear indication for Foley at this time.  Amenable to bedside commode, could trial this and reeducate about PureWick.  No other intervention for now 0515 -  BP & HR have become lower, start NE for MAP > 65  Intervention Category Evaluation Type: New Patient Evaluation  Roger Fasnacht 09/05/2024, 11:28 PM

## 2024-09-05 NOTE — CV Procedure (Signed)
" ° °  TRANSESOPHAGEAL ECHOCARDIOGRAM GUIDED DIRECT CURRENT CARDIOVERSION  NAME:  Monica Zamora   MRN: [id number] DOB:  1951/09/04   ADMIT DATE: 08/29/2024  INDICATIONS:  AF  PROCEDURE:   Informed consent was obtained prior to the procedure. The risks, benefits and alternatives for the procedure were discussed and the patient comprehended these risks.  Risks include, but are not limited to, cough, sore throat, vomiting, nausea, somnolence, esophageal and stomach trauma or perforation, bleeding, low blood pressure, aspiration, pneumonia, infection, trauma to the teeth and death.    After a procedural time-out, the oropharynx was anesthetized and the patient was sedated by the anesthesia service. The transesophageal probe was inserted in the esophagus and stomach without difficulty and multiple views were obtained.   FINDINGS:  LEFT VENTRICLE: EF = 20% global HK   RIGHT VENTRICLE: Severe HK  LEFT ATRIUM: Severely dilated  LEFT ATRIAL APPENDAGE: No clot  RIGHT ATRIUM: Severely dilated  AORTIC VALVE:  Trileaflet. Mildly calcified trivial AI  MITRAL VALVE:    Moderate MR  TRICUSPID VALVE: severe TR  PULMONIC VALVE: Trivial PR  INTERATRIAL SEPTUM: Lipomatous. No PFO/ASD  PERICARDIUM: Trivial effusion  DESCENDING AORTA: severe plaque   CARDIOVERSION:     Indications:  Atrial Fibrillation  Procedure Details:  Once the TEE was complete, the patient had the defibrillator pads placed in the anterior and posterior position. Once an appropriate level of sedation was achieved, the patient received a single biphasic, synchronized 200J shock with prompt conversion to sinus rhythm. No apparent complications.   Toribio Fuel, MD  7:54 AM     "

## 2024-09-05 NOTE — Progress Notes (Signed)
 BP 96/65, Dr. Franky at bedside as well as phlebotomy.  RR notified and cardiology updated.

## 2024-09-05 NOTE — Progress Notes (Signed)
 "  Monica Zamora  FMW:[id number] DOB: 11-21-1950 DOA: 08/29/2024 PCP: [name], NP    Brief Narrative:  73 year old with a history of atrial fibrillation, CHF, CAD, HTN, and CKD who presented to the ER 12/15 on referral from her cardiologist office where she presented with generalized fatigue, DOE, lower extremity edema, and a sense of palpitations and was found to be in A-fib with RVR again.  She had been hospitalized 11/12-18/25 for atrial fibrillation with RVR and was discharged at that time on metoprolol  and apixaban .  Goals of Care:   Code Status: Full Code   DVT prophylaxis: Place TED hose Start: 08/29/24 1951 apixaban  (ELIQUIS ) tablet 5 mg   Interim Hx: No acute events reported overnight.  Underwent TEE DCCV this morning with successful restoration of NSR.  Afebrile.  Blood pressure low with systolic 76-105.  Oxygen saturation 94-100% on 3 L nasal cannula.  Reports some nagging nausea since awakening from her procedure with some sore throat postprocedure.  Assessment & Plan:  Persistent atrial fibrillation with acute RVR Care per Cardiology - continue Eliquis  - history of prior DCCV -amiodarone  initiated - TEE/DCCV 12/22 with restoration of NSR - magnesium  normal - TSH not low -to continue IV amiodarone  1 more day   Acute exacerbation of chronic systolic congestive heart failure with right heart failure TTE noted EF 20-25% with global hypokinesis and severe RV systolic failure - Advanced Heart Failure Team following - decline in EF recently felt to be rate related -diuretic resumed  -off ARB/MRA due to AKI and low blood pressure  Filed Weights   09/03/24 0410 09/04/24 0500 09/05/24 0400  Weight: 77 kg 77.3 kg 78.9 kg    AKI on CKD stage IIIb Creatinine stable at present - baseline creatinine likely approximately 1.3 - suspect worsening due to hypoperfusion in setting of CHF and RVR -continue to follow trend  Recent Labs  Lab 09/02/24 0828 09/02/24 1848  09/03/24 0437 09/04/24 0211 09/05/24 0547  CREATININE 2.01* 1.86* 1.82* 1.93* 1.46*    CAD status post CABG 2021 Asymptomatic at present - 3 of 3 grafts patent on cardiac cath 2024 - continue statin  Macrocytic anemia History of GI bleed November 2025 due to bleeding ulcer with Hemoclip - hemoglobin stable at present  Hypokalemia Due to diuresis and poor intake - supplement to goal of 4.0 - magnesium  is normal  HTN BP well-controlled  COPD Well compensated at present  PVD Not on antiplatelet therapy due to use of apixaban  -continue statin  Depression/anxiety Continue usual diazepam  dose   Family Communication: no family present at time of exam Disposition:   Home Health Pt12/19/2025 1235  Objective: Blood pressure (!) 105/56, pulse 67, temperature (!) 96.9 F (36.1 C), temperature source Tympanic, resp. rate 17, height 5' 6 (1.676 m), weight 78.9 kg, SpO2 94%.  Intake/Output Summary (Last 24 hours) at 09/05/2024 0933 Last data filed at 09/05/2024 0402 Gross per 24 hour  Intake 835.53 ml  Output 850 ml  Net -14.47 ml   Filed Weights   09/03/24 0410 09/04/24 0500 09/05/24 0400  Weight: 77 kg 77.3 kg 78.9 kg    Examination: General: No acute respiratory distress Lungs: Clear to auscultation bilaterally without wheezes or crackles Cardiovascular: RRR status post DCCV Abdomen: NT/ND, soft, BS positive, no rebound Extremities: Trace bilateral lower extremity edema  CBC: Recent Labs  Lab 08/29/24 1719 08/30/24 0417 09/02/24 0828 09/04/24 0211  WBC 5.1 4.4 4.9 4.7  NEUTROABS 3.3  --   --   --  HGB 9.6* 9.1* 9.5* 9.2*  HCT 30.8* 28.9* 29.0* 28.2*  MCV 114.1* 113.3* 109.8* 108.5*  PLT 195 178 201 186   Basic Metabolic Panel: Recent Labs  Lab 08/29/24 1719 08/30/24 0417 09/03/24 0437 09/04/24 0211 09/05/24 0547  NA 136   < > 138 138 138  K 4.4   < > 3.3* 3.4* 3.5  CL 96*   < > 95* 94* 95*  CO2 28   < > 32 35* 32  GLUCOSE 95   < > 146* 105* 94   BUN 27*   < > 37* 37* 34*  CREATININE 1.71*   < > 1.82* 1.93* 1.46*  CALCIUM  8.4*   < > 8.8* 8.7* 8.8*  MG 2.2  --  2.0 2.0  --   PHOS  --   --   --  3.9  --    < > = values in this interval not displayed.   GFR: Estimated Creatinine Clearance: 36.9 mL/min (A) (by C-G formula based on SCr of 1.46 mg/dL (H)).   Scheduled Meds:  apixaban   5 mg Oral BID   Chlorhexidine  Gluconate Cloth  6 each Topical Daily   metoprolol  succinate  50 mg Oral q AM   multivitamin with minerals  1 tablet Oral Daily   pravastatin   20 mg Oral q1800   torsemide   20 mg Oral Daily   umeclidinium-vilanterol  1 puff Inhalation Daily   Continuous Infusions:  amiodarone  30 mg/hr (09/05/24 0741)     LOS: 7 days   Reyes IVAR Moores, MD [facility] Office  [phone] Pager - Text Page per Tracey  If 7PM-7AM, please contact night-coverage per Amion 09/05/2024, 9:33 AM     "

## 2024-09-05 NOTE — Progress Notes (Signed)
 "   Advanced Heart Failure Rounding Note   Subjective:    Feels ok.   Underwent TEE-DC-CV today.  EF 20% severe RV HK Mod MR severe TR.  Now back in NSR  Denies CP or SOB    Objective:   Weight Range:  Vital Signs:   Temp:  [96.9 F (36.1 C)-98.7 F (37.1 C)] 96.9 F (36.1 C) (12/22 0758) Pulse Rate:  [57-128] 58 (12/22 0808) Resp:  [18-22] 18 (12/22 0808) BP: (76-119)/(43-69) 84/47 (12/22 0808) SpO2:  [91 %-100 %] 100 % (12/22 0808) Weight:  [78.9 kg] 78.9 kg (12/22 0400) Last BM Date : 09/02/24  Weight change: Filed Weights   09/03/24 0410 09/04/24 0500 09/05/24 0400  Weight: 77 kg 77.3 kg 78.9 kg    Intake/Output:   Intake/Output Summary (Last 24 hours) at 09/05/2024 0820 Last data filed at 09/05/2024 0402 Gross per 24 hour  Intake 835.53 ml  Output 850 ml  Net -14.47 ml     Physical Exam: General:  Elderly. Chronically ill appearing. No resp difficulty HEENT: normal Neck: supple. JVP 7-8 Cor: Regular rate & rhythm. 3/6 TR Lungs: coarse Abdomen: soft, nontender, nondistended.Good bowel sounds. Extremities: no cyanosis, clubbing, rash, edema Neuro: alert & orientedx3, cranial nerves grossly intact. moves all 4 extremities w/o difficulty. Affect pleasant   Telemetry: sinus Personally reviewed  Labs: Basic Metabolic Panel: Recent Labs  Lab 08/29/24 1719 08/30/24 0417 09/02/24 0828 09/02/24 1848 09/03/24 0437 09/04/24 0211 09/05/24 0547  NA 136   < > 136 137 138 138 138  K 4.4   < > 3.7 3.6 3.3* 3.4* 3.5  CL 96*   < > 94* 95* 95* 94* 95*  CO2 28   < > 31 33* 32 35* 32  GLUCOSE 95   < > 105* 138* 146* 105* 94  BUN 27*   < > 38* 39* 37* 37* 34*  CREATININE 1.71*   < > 2.01* 1.86* 1.82* 1.93* 1.46*  CALCIUM  8.4*   < > 8.8* 9.4 8.8* 8.7* 8.8*  MG 2.2  --   --   --  2.0 2.0  --   PHOS  --   --   --   --   --  3.9  --    < > = values in this interval not displayed.    Liver Function Tests: Recent Labs  Lab 08/29/24 1719  AST 36  ALT 14   ALKPHOS 78  BILITOT 1.5*  PROT 6.0*  ALBUMIN 3.3*   No results for input(s): LIPASE, AMYLASE in the last 168 hours. No results for input(s): AMMONIA in the last 168 hours.  CBC: Recent Labs  Lab 08/29/24 1719 08/30/24 0417 09/02/24 0828 09/04/24 0211  WBC 5.1 4.4 4.9 4.7  NEUTROABS 3.3  --   --   --   HGB 9.6* 9.1* 9.5* 9.2*  HCT 30.8* 28.9* 29.0* 28.2*  MCV 114.1* 113.3* 109.8* 108.5*  PLT 195 178 201 186    Cardiac Enzymes: No results for input(s): CKTOTAL, CKMB, CKMBINDEX, TROPONINI in the last 168 hours.  BNP: BNP (last 3 results) Recent Labs    07/27/24 0930 08/29/24 1719  BNP 1,340.4* 2,397.9*    ProBNP (last 3 results) No results for input(s): PROBNP in the last 8760 hours.    Other results:  Imaging: EP STUDY Result Date: 09/05/2024 See surgical note for result.    Medications:     Scheduled Medications:  [MAR Hold] apixaban   5 mg Oral BID   [  MAR Hold] Chlorhexidine  Gluconate Cloth  6 each Topical Daily   [MAR Hold] metoprolol  succinate  50 mg Oral q AM   [MAR Hold] multivitamin with minerals  1 tablet Oral Daily   [MAR Hold] pravastatin   20 mg Oral q1800   [MAR Hold] umeclidinium-vilanterol  1 puff Inhalation Daily    Infusions:  sodium chloride      amiodarone  30 mg/hr (09/05/24 0741)    PRN Medications: [MAR Hold] acetaminophen  **OR** [DISCONTINUED] acetaminophen , [MAR Hold] bisacodyl , [MAR Hold] diazepam , [MAR Hold] ondansetron  **OR** [MAR Hold] ondansetron  (ZOFRAN ) IV, [MAR Hold] senna-docusate, [MAR Hold] traMADol    Assessment/Progress:   # Persistent A fib RVR Last in SR 12/2023. Has had Persistent  A Fib  RVR over the last 3 months with minimal symptom until now. Previously on amio but stopped due to COPD. Tikosyn not an option with CKD.  - s/p TEE. DC-CV today 09/05/24 In NSR - Continue IV amio one more day - Continue Eliquis    # A/C Biventricular systolic HF HF In 2024 EF improved 50-55% but most recent  Echos with severely reduced biventricular HF. She had a cath with patent graphs. Suspect tachy mediated. As above will try to restore sinus rhythm.   - TEE EF 20% severe RV HK. Severe TR mod MR - volume status much better - diuretics on hold due to AKI. Scr improved today - will restart torsemide  20 daily - no SGLT2i with UTIs - Continue Toprol  50 - may need to cut back if gets brady (with lung disease may do better with bisoprolol) - off ARB/MRA with AKI and low BP   #AKI on CKD Stage IIIb Creatinine baseline unclear baseline? ~ 1. 7-1.9 - Scr back to 1.4 today   #Chronic Respiratory Failure, Oxygen Dependent, COPD On 2 liters continuously.  Sats stable.    #CAD H/O CABG 2021. Cath 2024 3 of 3 patent grafts LIMA-LAD, SVG-PDA, SVG-OM 3 - no s/s angina - On statin, bb, and eliquis .    # Anemia  H/O GI Bleed. 07/2024 Colonsocopy with bleeding ulcer treated with hemoclip Hgb stable 9.2  # Hypokalemia - supp     Length of Stay: 7   Dodge Ator 09/05/2024, 8:20 AM  Advanced Heart Failure Team Pager [phone] (M-F; 7a - 4p)  Please contact [facility] for night-coverage after hours (4p -7a ) and weekends on [email or web address]  "

## 2024-09-05 NOTE — Transfer of Care (Signed)
 Immediate Anesthesia Transfer of Care Note  Patient: Monica Zamora  Procedure(s) Performed: TRANSESOPHAGEAL ECHOCARDIOGRAM CARDIOVERSION  Patient Location: Cath Lab  Anesthesia Type:MAC  Level of Consciousness: drowsy and patient cooperative  Airway & Oxygen Therapy: Patient Spontanous Breathing and Patient connected to nasal cannula oxygen  Post-op Assessment: Report given to RN, Post -op Vital signs reviewed and stable, and Patient moving all extremities X 4  Post vital signs: Reviewed and stable  Last Vitals:  Vitals Value Taken Time  BP 74/45 09/05/24 07:58  Temp 36.1 C 09/05/24 07:58  Pulse 56 09/05/24 07:59  Resp 18 09/05/24 07:59  SpO2 98 % 09/05/24 07:59  Vitals shown include unfiled device data.  Last Pain:  Vitals:   09/05/24 0758  TempSrc: Tympanic  PainSc: Asleep      Patients Stated Pain Goal: 0 (09/03/24 2010)  Complications: No notable events documented.  Hypotension discussed with Dr. Leopoldo, at bedside and treating

## 2024-09-05 NOTE — Progress Notes (Signed)
 CXR IMPRESSION: 1. Diffuse interstitial coarsening favoring pulmonary edema. Small bilateral pleural effusions. 2. Patchy basilar opacities favoring atelectasis. 3. Infection is difficult to exclude. Results sent to Dr. Franky, CBC pending.

## 2024-09-05 NOTE — Progress Notes (Signed)
" °   09/05/24 1629  Assess: MEWS Score  Temp (!) 96.6 F (35.9 C)  BP (!) 114/93  MAP (mmHg) 100  Pulse Rate 76  ECG Heart Rate 76  Resp (!) 24  Level of Consciousness Alert  SpO2 97 %  O2 Device Nasal Cannula  O2 Flow Rate (L/min) 3 L/min  Assess: MEWS Score  MEWS Temp 1  MEWS Systolic 0  MEWS Pulse 0  MEWS RR 1  MEWS LOC 0  MEWS Score 2  MEWS Score Color Yellow  Assess: if the MEWS score is Yellow or Red  Were vital signs accurate and taken at a resting state? Yes  Does the patient meet 2 or more of the SIRS criteria? No  Does the patient have a confirmed or suspected source of infection? No  MEWS guidelines implemented  No, previously yellow, continue vital signs every 4 hours  Notify: Charge Nurse/RN  Name of Charge Nurse/RN Notified Khamari Yousuf  Provider Notification  Provider Name/Title [email or web address]  Date Provider Notified 09/05/24  Time Provider Notified 1638  Method of Notification Page  Notification Reason Change in status  Provider response No new orders  Date of Provider Response 09/05/24  Time of Provider Response 1639  Assess: SIRS CRITERIA  SIRS Temperature  1  SIRS Respirations  1  SIRS Pulse 0  SIRS WBC 0  SIRS Score Sum  2    "

## 2024-09-05 NOTE — Plan of Care (Signed)
 Patient's nurse notified me about patient getting more short of breath and dizzy.  On exam at bedside patient is tachypneic.  In sinus rhythm.  I reviewed patient's notes labs and medications.  Patient had a cardioversion this morning.  Blood pressure is 94/59 pulse is 69/min and sinus rhythm temperature 97.2.  HEENT anicteric no pallor. Chest bilateral entry present mild crepitations. Heart S1-S2 heard. Abdomen mildly distended and nontender. Neuro alert awake oriented.  I ordered stat chest x-ray which shows diffuse interstitial coarsening favoring pulm edema with bilateral pleural effusion and patchy infiltrates.  I have ordered a stat CBC lactic acid troponin BNP.  On exam patient is cold on the peripheries.  With lower normal blood pressure concerning for possible cardiogenic shock.  I discussed with on-call cardiologist Dr. Napper who is coming to evaluate the patient.  Redia Cleaver MD

## 2024-09-05 NOTE — Progress Notes (Signed)
 During bedside report, noted tachypnea, patient alert and oriented x 4, stated she had not felt well since procedure this am.  She states she has no pain, nausea is gone, however, dizziness persist and short of breath.  Sats 90% on 3L ^ 4L, lungs with bibasilar rales and scattered wheezing, RR 28-32 with abdominal effort noted.  Neuro intact.  Patient appears pale and weak, hands and feet cool to the touch.  Dr. Franky covering for attending notified with new orders and updated Cardiology as well.  Will continue to monitor patient closely.

## 2024-09-05 NOTE — Progress Notes (Signed)
 OT Cancellation Note  Patient Details Name: Monica Zamora MRN: [id number] DOB: 03/20/1951   Cancelled Treatment:    Reason Eval/Treat Not Completed: Other (comment);Medical issues which prohibited therapy (Pt c/o N/V. Nsg made aware. Pt also c/o sore throat.)  Rochele Lueck,HILLARY 09/05/2024, 3:31 PM Kreg Sink, OT/L   Acute OT Clinical Specialist Acute Rehabilitation Services Pager [phone] Office [phone]

## 2024-09-05 NOTE — Consult Note (Addendum)
 "  NAME:  Monica Zamora, MRN:  [id number], DOB:  Jun 30, 1951, LOS: 7 ADMISSION DATE:  08/29/2024 CONSULTATION DATE: 09/05/2024 REFERRING MD:  Franky - TRH, CHIEF COMPLAINT:  SOB, hypotension   History of Present Illness:  73 year old woman who presented to [facility] 12/15 for LE edema/volume overload and SOB. PMHx significant for HTN, HLD, CAD s/p CABG (2021), HFrEF (Echo 07/2024 EF 20-25%), Afib (on Eliquis ), COPD with chronic hypoxic respiratory failure (on 2L HOT), former tobacco use, PUD/GIB (07/2024), CKD stage 3a. Recent admission 11/12-11/18 for Afib/RVR and GIB.  Patient was seen in follow up by Cardiology 12/15 and advised to present to ED for further evaluation and management of persistent AF/RVR, volume overload and urinary retention. Seen by Cardiology on ED arrival with recommendation for increased BB dosage and ongoing diuresis as tolerated. Admitted by TRH. Underwent close monitoring, attempted rate control for AF/RVR management and diuresis for management of volume overload. Developed AKI on CKD (in the setting of aggressive diuresis; this was adjusted.) GDMT limited due to kidney disease, frequent UTIs and COPD. AHF team consulted with plan for TEE/DCCV. Underwent TEE/DCCV 12/22 ([place]) with conversion to NSR.   Post-TEE, patient reported lightheadedness and SOB; she became tachypneic with RR 28-32. CXR demonstrated diffuse interstitial coarsening favoring pulmonary edema, small bilateral pleural effusions. On 12/22PM, patient was noted to be hypotensive with SBP 80s and concern for cardiogenic shock. Transferred to 2H.  PCCM was consulted for further evaluation and management.  Pertinent Medical History:   Past Medical History:  Diagnosis Date   A-fib (HCC)    Carotid artery disease    COPD (chronic obstructive pulmonary disease) (HCC)    Coronary artery disease    Former tobacco use    Heart failure with mildly reduced ejection fraction (HFmrEF) (HCC)    Hx of CABG     Hypertension    PUD (peptic ulcer disease)    Significant Hospital Events: Including procedures, antibiotic start and stop dates in addition to other pertinent events   12/15 - Admitted to [facility] via Cardiology clinic. Cardiology formally consulted. Admitted by [facility] 12/19 - AHF consulted. Plan for TEE/DCCV. 12/22 - Underwent TEE/DCCV with conversion to NSR. In PM, hypotensive prompting call to PCCM. Transferred to 2H.  Interim History / Subjective:  PCCM consulted for ICU admission and transfer.  Objective:  Blood pressure (!) 94/59, pulse 69, temperature (!) 97.2 F (36.2 C), temperature source Axillary, resp. rate (!) 28, height 5' 6 (1.676 m), weight 78.9 kg, SpO2 96%.        Intake/Output Summary (Last 24 hours) at 09/05/2024 2306 Last data filed at 09/05/2024 1853 Gross per 24 hour  Intake 835.53 ml  Output 850 ml  Net -14.47 ml   Filed Weights   09/03/24 0410 09/04/24 0500 09/05/24 0400  Weight: 77 kg 77.3 kg 78.9 kg   Physical Examination: General: Acute-on-chronically ill-appearing elderly woman in NAD. HEENT: [place]/AT, anicteric sclera, PERRL, moist mucous membranes. Neuro: Awake, oriented x 4. Responds to verbal stimuli. Following commands consistently. Moves all 4 extremities spontaneously. Generalized weakness. CV: Regular rhythm, rate 60s. No m/g/r. PULM: Breathing slightly tachypneic and mildly labored on 4LNC. Lung fields with crackles bilaterally at bases.SABRA GI: Soft, nontender, nondistended. Normoactive bowel sounds. Extremities: Bilateral asymmetric LE edema noted, L > R, pitting. Skin: Warm/dry, no rashes.  Resolved Hospital Problem List:    Assessment & Plan:  Undifferentiated shock, presume cardiogenic Lactic acidosis - Transfer to 2H ICU - Goal MAP > 65 - Diuresis as tolerated,  caution in the setting of low BP; Lasix  120 mg IV now - Monitor I&Os - Inotropic support with dobutamine  - If central access is necessary, obtain Co-ox - Trend  LA  HTN HLD CAD s/p CABG (2021) HFrEF Paroxysmal Afib  Echo 07/2024 EF 20-25%. CHA2DS2-VASc Score 5. S/p TEE/DCCV 12/22. - Appreciate Cardiology/AHF input - GDMT limited due to comorbidities (COPD, CKD/recurrent UTI) - Eliquis  for AC - Metoprolol , amio held in the setting of hypotension, borderline bradycardia - Optimize electrolyes (K > 4, Mg > 2) - Cardiac monitoring - F/u Echo, LE dopplers negative  COPD with chronic hypoxic respiratory failure (on 2L HOT) Former tobacco use - Supplemental O2 support for O2 sat > 90% - Bronchodilators as indicated (Anoro Ellipta  transitioned to Brovana /Yupelri ) - Pulmonary hygiene - Follow CXR - Diuresis/I&Os as above  PUD/GIB (07/2024) - Trend H&H - Monitor for signs of active bleeding - Transfuse for Hgb < 7.0 or hemodynamically significant bleeding  AKI on CKD stage 3a, presume prerenal 2/2 hypotension ?Urinary retention - Trend BMP - Replete electrolytes as indicated - Monitor I&Os - Avoid nephrotoxic agents as able - Ensure adequate renal perfusion  Elevated LFTs Presume 2/2 hypotension, ?shock liver. - Trend LFTs to normal  Labs:  CBC: Recent Labs  Lab 08/30/24 0417 09/02/24 0828 09/04/24 0211 09/05/24 2120  WBC 4.4 4.9 4.7 7.9  HGB 9.1* 9.5* 9.2* 11.3*  HCT 28.9* 29.0* 28.2* 35.5*  MCV 113.3* 109.8* 108.5* 110.6*  PLT 178 201 186 249   Basic Metabolic Panel: Recent Labs  Lab 09/02/24 1848 09/03/24 0437 09/04/24 0211 09/05/24 0547 09/05/24 2120  NA 137 138 138 138 135  K 3.6 3.3* 3.4* 3.5 6.1*  CL 95* 95* 94* 95* 93*  CO2 33* 32 35* 32 24  GLUCOSE 138* 146* 105* 94 103*  BUN 39* 37* 37* 34* 37*  CREATININE 1.86* 1.82* 1.93* 1.46* 2.01*  CALCIUM  9.4 8.8* 8.7* 8.8* 9.4  MG  --  2.0 2.0  --   --   PHOS  --   --  3.9  --   --    GFR: Estimated Creatinine Clearance: 26.8 mL/min (A) (by C-G formula based on SCr of 2.01 mg/dL (H)). Recent Labs  Lab 08/30/24 0417 09/02/24 0828 09/02/24 1042  09/04/24 0211 09/05/24 2120  WBC 4.4 4.9  --  4.7 7.9  LATICACIDVEN  --  1.0 1.1  --  5.4*   Liver Function Tests: Recent Labs  Lab 09/05/24 2120  AST 194*  ALT 81*  ALKPHOS 288*  BILITOT 1.5*  PROT 7.0  ALBUMIN 3.8   No results for input(s): LIPASE, AMYLASE in the last 168 hours. No results for input(s): AMMONIA in the last 168 hours.  ABG:    Component Value Date/Time   PHART 7.351 03/17/2023 1541   PCO2ART 46.5 03/17/2023 1541   PO2ART 87 03/17/2023 1541   HCO3 25.7 03/17/2023 1541   TCO2 19 (L) 07/27/2024 1235   O2SAT 80.7 03/25/2023 0424   Coagulation Profile: No results for input(s): INR, PROTIME in the last 168 hours.  Cardiac Enzymes: No results for input(s): CKTOTAL, CKMB, CKMBINDEX, TROPONINI in the last 168 hours.  HbA1C: No results found for: HGBA1C  CBG: No results for input(s): GLUCAP in the last 168 hours.  Review of Systems:   Review of systems completed with pertinent positives/negatives outlined in above HPI.  Past Medical History:  She,  has a past medical history of A-fib (HCC), Carotid artery disease, COPD (chronic obstructive pulmonary disease) (  HCC), Coronary artery disease, Former tobacco use, Heart failure with mildly reduced ejection fraction (HFmrEF) (HCC), CABG, Hypertension, and PUD (peptic ulcer disease).   Surgical History:   Past Surgical History:  Procedure Laterality Date   ABDOMINAL HYSTERECTOMY     CARDIOVERSION N/A 03/20/2023   Procedure: CARDIOVERSION;  Surgeon: Rolan Ezra RAMAN, MD;  Location: [facility] INVASIVE CV LAB;  Service: Cardiovascular;  Laterality: N/A;   CHOLECYSTECTOMY     COLONOSCOPY N/A 07/31/2024   Procedure: COLONOSCOPY;  Surgeon: Rollin Dover, MD;  Location: [facility] ENDOSCOPY;  Service: Gastroenterology;  Laterality: N/A;   CORONARY ARTERY BYPASS GRAFT     ESOPHAGOGASTRODUODENOSCOPY N/A 07/29/2024   Procedure: EGD (ESOPHAGOGASTRODUODENOSCOPY);  Surgeon: Leigh Elspeth SQUIBB, MD;  Location:  [facility] ENDOSCOPY;  Service: Gastroenterology;  Laterality: N/A;   RIGHT/LEFT HEART CATH AND CORONARY/GRAFT ANGIOGRAPHY N/A 03/17/2023   Procedure: RIGHT/LEFT HEART CATH AND CORONARY/GRAFT ANGIOGRAPHY;  Surgeon: Anner Alm ORN, MD;  Location: [facility] INVASIVE CV LAB;  Service: Cardiovascular;  Laterality: N/A;   ruptured disc     SPINE SURGERY     TEE WITHOUT CARDIOVERSION N/A 03/20/2023   Procedure: TRANSESOPHAGEAL ECHOCARDIOGRAM;  Surgeon: Rolan Ezra RAMAN, MD;  Location: [facility] INVASIVE CV LAB;  Service: Cardiovascular;  Laterality: N/A;   Social History:   reports that she quit smoking about 2 years ago. Her smoking use included cigarettes. She started smoking about 57 years ago. She has a 82.3 pack-year smoking history. She has never used smokeless tobacco. She reports that she does not currently use alcohol. She reports that she does not use drugs.   Family History:  Her family history includes Lupus in her mother. There is no history of Lung disease.   Allergies: Allergies[1]   Home Medications: Prior to Admission medications  Medication Sig Start Date End Date Taking? Authorizing Provider  albuterol  (VENTOLIN  HFA) 108 (90 Base) MCG/ACT inhaler Inhale 1 puff into the lungs 2 (two) times daily as needed for wheezing or shortness of breath. 07/10/21  Yes [provider]  apixaban  (ELIQUIS ) 5 MG TABS tablet Take 1 tablet (5 mg total) by mouth 2 (two) times daily. 08/12/23  Yes Pietro Redell RAMAN, MD  cyclobenzaprine (FLEXERIL) 10 MG tablet Take 10 mg by mouth at bedtime. 12/31/23  Yes [provider]  diazepam  (VALIUM ) 5 MG tablet Take 5 mg by mouth 2 (two) times daily as needed for anxiety. 11/06/23  Yes [provider]  furosemide  (LASIX ) 40 MG tablet Take 1.5 tablets (60 mg total) by mouth daily. 08/23/24  Yes Pietro Redell RAMAN, MD  metoprolol  succinate (TOPROL -XL) 50 MG 24 hr tablet Take 1.5 tablets (75 mg total) by mouth at bedtime. Take with or immediately following a  meal. 08/23/24  Yes Crenshaw, Redell RAMAN, MD  Multiple Vitamin (MULTIVITAMIN WITH MINERALS) TABS tablet Take 1 tablet by mouth daily.   Yes [provider]  OXYGEN Inhale 2 L into the lungs continuous.   Yes [provider]  pravastatin  (PRAVACHOL ) 20 MG tablet Take 1 tablet (20 mg total) by mouth daily at 6 PM. 11/30/23 02/03/25 Yes Meng, Hao, PA  umeclidinium-vilanterol (ANORO ELLIPTA ) 62.5-25 MCG/ACT AEPB Inhale 1 puff into the lungs daily. 08/02/24  Yes [name], DO   Critical care time:   The patient is critically ill with multiple organ system failure and requires high complexity decision making for assessment and support, frequent evaluation and titration of therapies, advanced monitoring, review of radiographic studies and interpretation of complex data.   Critical Care Time devoted  to patient care services, exclusive of separately billable procedures, described in this note is 39 minutes.  Corean CHRISTELLA Teirra Carapia, PA-C [place] 09/05/2024 11:06 PM  Please see [email or web address] for pager details.  From 7A-7P if no response, please call [phone] After hours, please call ELink [phone]     [1] No Known Allergies  "

## 2024-09-05 NOTE — Consult Note (Incomplete)
 "  NAME:  Monica Zamora, MRN:  [id number], DOB:  January 09, 1951, LOS: 7 ADMISSION DATE:  08/29/2024 CONSULTATION DATE: 09/05/2024 REFERRING MD:  Franky - TRH, CHIEF COMPLAINT:  SOB, hypotension   History of Present Illness:  73 year old woman who presented to [facility] 12/15 for ***. PMHx significant for HTN, HLD, CAD s/p CABG (2021), HFrEF (Echo ***), Afib (on Eliquis ), COPD with chronic hypoxic respiratory failure (on 2L HOT), former tobacco use, PUD/GIB (07/2024), CKD stage 3a. Recent admission 11/12-11/18 for Afib/RVR and GIB.  Patient was seen in follow up by Cardiology 12/15 and advised to present to ED for further evaluation and management of persistent AF/RVR, volume overload and urinary retention. Seen by Cardiology on ED arrival with recommendation for increased BB dosage and ongoing diuresis as tolerated.  Pertinent Medical History:   Past Medical History:  Diagnosis Date   A-fib (HCC)    Carotid artery disease    COPD (chronic obstructive pulmonary disease) (HCC)    Coronary artery disease    Former tobacco use    Heart failure with mildly reduced ejection fraction (HFmrEF) (HCC)    Hx of CABG    Hypertension    PUD (peptic ulcer disease)    Significant Hospital Events: Including procedures, antibiotic start and stop dates in addition to other pertinent events   12/15 - Admitted to [facility]  Interim History / Subjective:  ***  Objective:  Blood pressure (!) 94/59, pulse 69, temperature (!) 97.2 F (36.2 C), temperature source Axillary, resp. rate (!) 28, height 5' 6 (1.676 m), weight 78.9 kg, SpO2 96%.        Intake/Output Summary (Last 24 hours) at 09/05/2024 2306 Last data filed at 09/05/2024 1853 Gross per 24 hour  Intake 835.53 ml  Output 850 ml  Net -14.47 ml   Filed Weights   09/03/24 0410 09/04/24 0500 09/05/24 0400  Weight: 77 kg 77.3 kg 78.9 kg   Physical Examination: General: {SRACUITY:25313} ill-appearing *** in NAD. HEENT: [place]/AT, anicteric  sclera, PERRL, moist mucous membranes. Neuro: {SRLOC:25308} {SRSTIMULI:25309} {SRCOMMANDS:25310} {SRNEUROEXTREMITIES:25312} Strength ***/5 in *** extremities. {SRRBRAINSTEM:25311}  CV: RRR, no m/g/r. PULM: Breathing even and unlabored on ***. Lung fields ***. GI: Soft, nontender, nondistended. Normoactive bowel sounds. Extremities: *** LE edema noted. Skin: Warm/dry, ***.  Resolved Hospital Problem List:    Assessment & Plan:  ***  Labs:  CBC: Recent Labs  Lab 08/30/24 0417 09/02/24 0828 09/04/24 0211 09/05/24 2120  WBC 4.4 4.9 4.7 7.9  HGB 9.1* 9.5* 9.2* 11.3*  HCT 28.9* 29.0* 28.2* 35.5*  MCV 113.3* 109.8* 108.5* 110.6*  PLT 178 201 186 249   Basic Metabolic Panel: Recent Labs  Lab 09/02/24 1848 09/03/24 0437 09/04/24 0211 09/05/24 0547 09/05/24 2120  NA 137 138 138 138 135  K 3.6 3.3* 3.4* 3.5 6.1*  CL 95* 95* 94* 95* 93*  CO2 33* 32 35* 32 24  GLUCOSE 138* 146* 105* 94 103*  BUN 39* 37* 37* 34* 37*  CREATININE 1.86* 1.82* 1.93* 1.46* 2.01*  CALCIUM  9.4 8.8* 8.7* 8.8* 9.4  MG  --  2.0 2.0  --   --   PHOS  --   --  3.9  --   --    GFR: Estimated Creatinine Clearance: 26.8 mL/min (A) (by C-G formula based on SCr of 2.01 mg/dL (H)). Recent Labs  Lab 08/30/24 0417 09/02/24 0828 09/02/24 1042 09/04/24 0211 09/05/24 2120  WBC 4.4 4.9  --  4.7 7.9  LATICACIDVEN  --  1.0 1.1  --  5.4*   Liver Function Tests: Recent Labs  Lab 09/05/24 2120  AST 194*  ALT 81*  ALKPHOS 288*  BILITOT 1.5*  PROT 7.0  ALBUMIN 3.8   No results for input(s): LIPASE, AMYLASE in the last 168 hours. No results for input(s): AMMONIA in the last 168 hours.  ABG:    Component Value Date/Time   PHART 7.351 03/17/2023 1541   PCO2ART 46.5 03/17/2023 1541   PO2ART 87 03/17/2023 1541   HCO3 25.7 03/17/2023 1541   TCO2 19 (L) 07/27/2024 1235   O2SAT 80.7 03/25/2023 0424   Coagulation Profile: No results for input(s): INR, PROTIME in the last 168 hours.  Cardiac  Enzymes: No results for input(s): CKTOTAL, CKMB, CKMBINDEX, TROPONINI in the last 168 hours.  HbA1C: No results found for: HGBA1C  CBG: No results for input(s): GLUCAP in the last 168 hours.  Review of Systems:   ***  Past Medical History:  She,  has a past medical history of A-fib (HCC), Carotid artery disease, COPD (chronic obstructive pulmonary disease) (HCC), Coronary artery disease, Former tobacco use, Heart failure with mildly reduced ejection fraction (HFmrEF) (HCC), CABG, Hypertension, and PUD (peptic ulcer disease).   Surgical History:   Past Surgical History:  Procedure Laterality Date   ABDOMINAL HYSTERECTOMY     CARDIOVERSION N/A 03/20/2023   Procedure: CARDIOVERSION;  Surgeon: Rolan Ezra RAMAN, MD;  Location: [facility] INVASIVE CV LAB;  Service: Cardiovascular;  Laterality: N/A;   CHOLECYSTECTOMY     COLONOSCOPY N/A 07/31/2024   Procedure: COLONOSCOPY;  Surgeon: Rollin Dover, MD;  Location: [facility] ENDOSCOPY;  Service: Gastroenterology;  Laterality: N/A;   CORONARY ARTERY BYPASS GRAFT     ESOPHAGOGASTRODUODENOSCOPY N/A 07/29/2024   Procedure: EGD (ESOPHAGOGASTRODUODENOSCOPY);  Surgeon: Leigh Elspeth SQUIBB, MD;  Location: [facility] ENDOSCOPY;  Service: Gastroenterology;  Laterality: N/A;   RIGHT/LEFT HEART CATH AND CORONARY/GRAFT ANGIOGRAPHY N/A 03/17/2023   Procedure: RIGHT/LEFT HEART CATH AND CORONARY/GRAFT ANGIOGRAPHY;  Surgeon: Anner Alm ORN, MD;  Location: [facility] INVASIVE CV LAB;  Service: Cardiovascular;  Laterality: N/A;   ruptured disc     SPINE SURGERY     TEE WITHOUT CARDIOVERSION N/A 03/20/2023   Procedure: TRANSESOPHAGEAL ECHOCARDIOGRAM;  Surgeon: Rolan Ezra RAMAN, MD;  Location: [facility] INVASIVE CV LAB;  Service: Cardiovascular;  Laterality: N/A;   Social History:   reports that she quit smoking about 2 years ago. Her smoking use included cigarettes. She started smoking about 57 years ago. She has a 82.3 pack-year smoking history. She has never used  smokeless tobacco. She reports that she does not currently use alcohol. She reports that she does not use drugs.   Family History:  Her family history includes Lupus in her mother. There is no history of Lung disease.   Allergies: Allergies[1]   Home Medications: Prior to Admission medications  Medication Sig Start Date End Date Taking? Authorizing Provider  albuterol  (VENTOLIN  HFA) 108 (90 Base) MCG/ACT inhaler Inhale 1 puff into the lungs 2 (two) times daily as needed for wheezing or shortness of breath. 07/10/21  Yes [provider]  apixaban  (ELIQUIS ) 5 MG TABS tablet Take 1 tablet (5 mg total) by mouth 2 (two) times daily. 08/12/23  Yes Pietro Redell RAMAN, MD  cyclobenzaprine (FLEXERIL) 10 MG tablet Take 10 mg by mouth at bedtime. 12/31/23  Yes [provider]  diazepam  (VALIUM ) 5 MG tablet Take 5 mg by mouth 2 (two) times daily as needed for anxiety. 11/06/23  Yes [provider]  furosemide  (LASIX ) 40 MG tablet Take  1.5 tablets (60 mg total) by mouth daily. 08/23/24  Yes Pietro Redell RAMAN, MD  metoprolol  succinate (TOPROL -XL) 50 MG 24 hr tablet Take 1.5 tablets (75 mg total) by mouth at bedtime. Take with or immediately following a meal. 08/23/24  Yes Crenshaw, Redell RAMAN, MD  Multiple Vitamin (MULTIVITAMIN WITH MINERALS) TABS tablet Take 1 tablet by mouth daily.   Yes [provider]  OXYGEN Inhale 2 L into the lungs continuous.   Yes [provider]  pravastatin  (PRAVACHOL ) 20 MG tablet Take 1 tablet (20 mg total) by mouth daily at 6 PM. 11/30/23 02/03/25 Yes Meng, Hao, PA  umeclidinium-vilanterol (ANORO ELLIPTA ) 62.5-25 MCG/ACT AEPB Inhale 1 puff into the lungs daily. 08/02/24  Yes [name], DO   Critical care time:   The patient is critically ill with multiple organ system failure and requires high complexity decision making for assessment and support, frequent evaluation and titration of therapies, advanced monitoring, review of  radiographic studies and interpretation of complex data.   Critical Care Time devoted to patient care services, exclusive of separately billable procedures, described in this note is *** minutes.  Corean CHRISTELLA Joselin Crandell, PA-C [place] [facility] 09/05/2024 11:06 PM  Please see [email or web address] for pager details.  From 7A-7P if no response, please call [phone] After hours, please call ELink [phone]      [1] No Known Allergies "

## 2024-09-05 NOTE — Progress Notes (Signed)
 Per provider McClung it's okay to remove the pt's foley & trial it out. Foley was removed around 1630. Will continue to monitor the pt.

## 2024-09-06 ENCOUNTER — Encounter (HOSPITAL_COMMUNITY): Payer: Self-pay | Admitting: Internal Medicine

## 2024-09-06 ENCOUNTER — Inpatient Hospital Stay (HOSPITAL_COMMUNITY)

## 2024-09-06 ENCOUNTER — Other Ambulatory Visit: Payer: Self-pay

## 2024-09-06 DIAGNOSIS — I480 Paroxysmal atrial fibrillation: Secondary | ICD-10-CM

## 2024-09-06 DIAGNOSIS — R570 Cardiogenic shock: Secondary | ICD-10-CM

## 2024-09-06 DIAGNOSIS — E872 Acidosis, unspecified: Secondary | ICD-10-CM | POA: Diagnosis not present

## 2024-09-06 DIAGNOSIS — I2510 Atherosclerotic heart disease of native coronary artery without angina pectoris: Secondary | ICD-10-CM | POA: Diagnosis not present

## 2024-09-06 DIAGNOSIS — I5082 Biventricular heart failure: Secondary | ICD-10-CM | POA: Diagnosis not present

## 2024-09-06 DIAGNOSIS — I5023 Acute on chronic systolic (congestive) heart failure: Secondary | ICD-10-CM | POA: Diagnosis not present

## 2024-09-06 DIAGNOSIS — E875 Hyperkalemia: Secondary | ICD-10-CM | POA: Diagnosis not present

## 2024-09-06 DIAGNOSIS — N1832 Chronic kidney disease, stage 3b: Secondary | ICD-10-CM | POA: Diagnosis not present

## 2024-09-06 DIAGNOSIS — I4819 Other persistent atrial fibrillation: Secondary | ICD-10-CM | POA: Diagnosis not present

## 2024-09-06 DIAGNOSIS — J690 Pneumonitis due to inhalation of food and vomit: Secondary | ICD-10-CM | POA: Diagnosis not present

## 2024-09-06 DIAGNOSIS — J9611 Chronic respiratory failure with hypoxia: Secondary | ICD-10-CM | POA: Diagnosis not present

## 2024-09-06 DIAGNOSIS — I130 Hypertensive heart and chronic kidney disease with heart failure and stage 1 through stage 4 chronic kidney disease, or unspecified chronic kidney disease: Secondary | ICD-10-CM | POA: Diagnosis not present

## 2024-09-06 DIAGNOSIS — J449 Chronic obstructive pulmonary disease, unspecified: Secondary | ICD-10-CM | POA: Diagnosis not present

## 2024-09-06 LAB — COMPREHENSIVE METABOLIC PANEL WITH GFR
ALT: 238 U/L — ABNORMAL HIGH (ref 0–44)
AST: 431 U/L — ABNORMAL HIGH (ref 15–41)
Albumin: 3.2 g/dL — ABNORMAL LOW (ref 3.5–5.0)
Alkaline Phosphatase: 209 U/L — ABNORMAL HIGH (ref 38–126)
Anion gap: 8 (ref 5–15)
BUN: 42 mg/dL — ABNORMAL HIGH (ref 8–23)
CO2: 32 mmol/L (ref 22–32)
Calcium: 8.9 mg/dL (ref 8.9–10.3)
Chloride: 93 mmol/L — ABNORMAL LOW (ref 98–111)
Creatinine, Ser: 2.06 mg/dL — ABNORMAL HIGH (ref 0.44–1.00)
GFR, Estimated: 25 mL/min — ABNORMAL LOW
Glucose, Bld: 132 mg/dL — ABNORMAL HIGH (ref 70–99)
Potassium: 4.7 mmol/L (ref 3.5–5.1)
Sodium: 132 mmol/L — ABNORMAL LOW (ref 135–145)
Total Bilirubin: 0.6 mg/dL (ref 0.0–1.2)
Total Protein: 5.9 g/dL — ABNORMAL LOW (ref 6.5–8.1)

## 2024-09-06 LAB — BASIC METABOLIC PANEL WITH GFR
Anion gap: 9 (ref 5–15)
BUN: 39 mg/dL — ABNORMAL HIGH (ref 8–23)
CO2: 31 mmol/L (ref 22–32)
Calcium: 9.1 mg/dL (ref 8.9–10.3)
Chloride: 96 mmol/L — ABNORMAL LOW (ref 98–111)
Creatinine, Ser: 2.03 mg/dL — ABNORMAL HIGH (ref 0.44–1.00)
GFR, Estimated: 25 mL/min — ABNORMAL LOW
Glucose, Bld: 103 mg/dL — ABNORMAL HIGH (ref 70–99)
Potassium: 5.4 mmol/L — ABNORMAL HIGH (ref 3.5–5.1)
Sodium: 135 mmol/L (ref 135–145)

## 2024-09-06 LAB — LACTIC ACID, PLASMA
Lactic Acid, Venous: 1.2 mmol/L (ref 0.5–1.9)
Lactic Acid, Venous: 3.8 mmol/L (ref 0.5–1.9)

## 2024-09-06 LAB — TROPONIN T, HIGH SENSITIVITY: Troponin T High Sensitivity: 24 ng/L — ABNORMAL HIGH (ref 0–19)

## 2024-09-06 LAB — CBC
HCT: 28.3 % — ABNORMAL LOW (ref 36.0–46.0)
Hemoglobin: 9.1 g/dL — ABNORMAL LOW (ref 12.0–15.0)
MCH: 35.7 pg — ABNORMAL HIGH (ref 26.0–34.0)
MCHC: 32.2 g/dL (ref 30.0–36.0)
MCV: 111 fL — ABNORMAL HIGH (ref 80.0–100.0)
Platelets: 161 K/uL (ref 150–400)
RBC: 2.55 MIL/uL — ABNORMAL LOW (ref 3.87–5.11)
RDW: 15.5 % (ref 11.5–15.5)
WBC: 5.3 K/uL (ref 4.0–10.5)
nRBC: 0 % (ref 0.0–0.2)

## 2024-09-06 LAB — COOXEMETRY PANEL
Carboxyhemoglobin: 1.7 % — ABNORMAL HIGH (ref 0.5–1.5)
Carboxyhemoglobin: 2 % — ABNORMAL HIGH (ref 0.5–1.5)
Methemoglobin: <0.7 % (ref 0.0–1.5)
Methemoglobin: <0.7 % (ref 0.0–1.5)
O2 Saturation: 60.3 %
O2 Saturation: 61.3 %
Total hemoglobin: 10.1 g/dL — ABNORMAL LOW (ref 12.0–16.0)
Total hemoglobin: 9.9 g/dL — ABNORMAL LOW (ref 12.0–16.0)

## 2024-09-06 LAB — MAGNESIUM: Magnesium: 2.2 mg/dL (ref 1.7–2.4)

## 2024-09-06 LAB — MRSA NEXT GEN BY PCR, NASAL: MRSA by PCR Next Gen: DETECTED — AB

## 2024-09-06 LAB — CG4 I-STAT (LACTIC ACID): Lactic Acid, Venous: 0.8 mmol/L (ref 0.5–1.9)

## 2024-09-06 MED ORDER — SODIUM CHLORIDE 0.9 % IV SOLN: 250.0000 mL | INTRAVENOUS | Status: AC

## 2024-09-06 MED ORDER — DOBUTAMINE-DEXTROSE 4-5 MG/ML-% IV SOLN
5.0000 ug/kg/min | INTRAVENOUS | Status: DC
  Administered 2024-09-06 (×2): 5 ug/kg/min via INTRAVENOUS
  Filled 2024-09-06 (×2): qty 250

## 2024-09-06 MED ORDER — METOLAZONE 2.5 MG PO TABS: 2.5000 mg | ORAL_TABLET | Freq: Once | ORAL | Status: DC

## 2024-09-06 MED ORDER — NOREPINEPHRINE 4 MG/250ML-% IV SOLN
0.0000 ug/min | INTRAVENOUS | Status: DC
  Administered 2024-09-06: 8 ug/min via INTRAVENOUS
  Administered 2024-09-06: 2 ug/min via INTRAVENOUS
  Administered 2024-09-06: 8 ug/min via INTRAVENOUS
  Administered 2024-09-07: 5 ug/min via INTRAVENOUS
  Administered 2024-09-07: 8 ug/min via INTRAVENOUS
  Administered 2024-09-08: 4 ug/min via INTRAVENOUS
  Administered 2024-09-08 – 2024-09-11 (×2): 5 ug/min via INTRAVENOUS
  Filled 2024-09-06 (×8): qty 250

## 2024-09-06 MED ORDER — FUROSEMIDE 10 MG/ML IJ SOLN
120.0000 mg | Freq: Once | INTRAVENOUS | Status: AC
  Administered 2024-09-06: 120 mg via INTRAVENOUS
  Filled 2024-09-06: qty 10

## 2024-09-06 MED ORDER — SODIUM CHLORIDE 0.9 % IV SOLN
3.0000 g | Freq: Two times a day (BID) | INTRAVENOUS | Status: DC
  Administered 2024-09-06 – 2024-09-07 (×2): 3 g via INTRAVENOUS
  Filled 2024-09-06 (×2): qty 8

## 2024-09-06 MED ORDER — FUROSEMIDE 10 MG/ML IJ SOLN
120.0000 mg | Freq: Once | INTRAVENOUS | Status: AC
  Administered 2024-09-06: 120 mg via INTRAVENOUS
  Filled 2024-09-06: qty 120

## 2024-09-06 MED ORDER — REVEFENACIN 175 MCG/3ML IN SOLN
175.0000 ug | Freq: Every day | RESPIRATORY_TRACT | Status: DC
  Administered 2024-09-06 – 2024-09-11 (×5): 175 ug via RESPIRATORY_TRACT
  Filled 2024-09-06 (×6): qty 3

## 2024-09-06 MED ORDER — CHLORHEXIDINE GLUCONATE CLOTH 2 % EX PADS: 6.0000 | MEDICATED_PAD | Freq: Every day | CUTANEOUS | Status: DC

## 2024-09-06 MED ORDER — SODIUM CHLORIDE 0.9% FLUSH: 10.0000 mL | INTRAVENOUS | Status: DC | PRN

## 2024-09-06 MED ORDER — SODIUM CHLORIDE 0.9% FLUSH
10.0000 mL | Freq: Two times a day (BID) | INTRAVENOUS | Status: DC
  Administered 2024-09-06 – 2024-09-10 (×8): 10 mL
  Administered 2024-09-10: 20 mL
  Administered 2024-09-11: 10 mL
  Administered 2024-09-11: 30 mL
  Administered 2024-09-12 (×2): 10 mL
  Administered 2024-09-13: 20 mL
  Administered 2024-09-13 – 2024-09-14 (×2): 10 mL
  Administered 2024-09-14: 20 mL
  Administered 2024-09-15 – 2024-09-16 (×3): 10 mL

## 2024-09-06 MED ORDER — ARFORMOTEROL TARTRATE 15 MCG/2ML IN NEBU
15.0000 ug | INHALATION_SOLUTION | Freq: Two times a day (BID) | RESPIRATORY_TRACT | Status: DC
  Administered 2024-09-06 – 2024-09-12 (×12): 15 ug via RESPIRATORY_TRACT
  Filled 2024-09-06 (×13): qty 2

## 2024-09-06 MED ORDER — AMIODARONE HCL 200 MG PO TABS
200.0000 mg | ORAL_TABLET | Freq: Every day | ORAL | Status: DC
  Administered 2024-09-07: 200 mg via ORAL
  Filled 2024-09-06 (×2): qty 1

## 2024-09-06 MED ORDER — MUPIROCIN 2 % EX OINT
1.0000 | TOPICAL_OINTMENT | Freq: Two times a day (BID) | CUTANEOUS | Status: AC
  Administered 2024-09-06 – 2024-09-10 (×10): 1 via NASAL
  Filled 2024-09-06: qty 22

## 2024-09-06 NOTE — Progress Notes (Addendum)
 "   Advanced Heart Failure Rounding Note  [facility] Cardiologist: Dr. Zenaida  Subjective:    Transferred to the ICU overnight with concern for low-output HF and lactic acidosis.  Now on 8 NE + 5 DBA.  PICC just placed. Awaiting Co-ox and lactic acid. CVP being set up.  Minimal UOP overnight with 120 mg IV lasix . IN and out cath X 1.  Feels much better. Shortness of breath resolved.   Objective:   Weight Range:  Vital Signs:   Temp:  [96.6 F (35.9 C)-98.4 F (36.9 C)] 96.9 F (36.1 C) (12/23 1100) Pulse Rate:  [47-197] 56 (12/23 1100) Resp:  [0-31] 18 (12/23 1100) BP: (86-122)/(35-98) 104/52 (12/23 1030) SpO2:  [90 %-100 %] 96 % (12/23 1100) Weight:  [79.3 kg] 79.3 kg (12/23 0500) Last BM Date : 09/02/24  Weight change: Filed Weights   09/05/24 0400 09/06/24 0000 09/06/24 0500  Weight: 78.9 kg 79.3 kg 79.3 kg    Intake/Output:   Intake/Output Summary (Last 24 hours) at [facility] 1144 Last data filed at [facility] 1100 Gross per 24 hour  Intake 588.64 ml  Output 200 ml  Net 388.64 ml     Physical Exam: General: Chronically ill appearing Neck: JVP ~ 8 cm Cor: Regular rate & rhythm. 2/6 TR murmur Lungs: clear anteriorly, breathing nonlabored Abdomen: soft, nontender, nondistended. Extremities: no edema Neuro: alert & orientedx3. Affect pleasant    Telemetry: Sinus/sinus brady 50s-60s  Labs: Basic Metabolic Panel: Recent Labs  Lab 09/03/24 0437 09/04/24 0211 09/05/24 0547 09/05/24 2120 09/06/24 0441  NA 138 138 138 135 135  K 3.3* 3.4* 3.5 6.1* 5.4*  CL 95* 94* 95* 93* 96*  CO2 32 35* 32 24 31  GLUCOSE 146* 105* 94 103* 103*  BUN 37* 37* 34* 37* 39*  CREATININE 1.82* 1.93* 1.46* 2.01* 2.03*  CALCIUM  8.8* 8.7* 8.8* 9.4 9.1  MG 2.0 2.0  --   --  2.2  PHOS  --  3.9  --   --   --     Liver Function Tests: Recent Labs  Lab 09/05/24 2120  AST 194*  ALT 81*  ALKPHOS 288*  BILITOT 1.5*  PROT 7.0  ALBUMIN 3.8   No results for input(s):  LIPASE, AMYLASE in the last 168 hours. No results for input(s): AMMONIA in the last 168 hours.  CBC: Recent Labs  Lab 09/02/24 0828 09/04/24 0211 09/05/24 2120 09/06/24 0441  WBC 4.9 4.7 7.9 5.3  HGB 9.5* 9.2* 11.3* 9.1*  HCT 29.0* 28.2* 35.5* 28.3*  MCV 109.8* 108.5* 110.6* 111.0*  PLT 201 186 249 161    Cardiac Enzymes: No results for input(s): CKTOTAL, CKMB, CKMBINDEX, TROPONINI in the last 168 hours.  BNP: BNP (last 3 results) Recent Labs    07/27/24 0930 08/29/24 1719  BNP 1,340.4* 2,397.9*    ProBNP (last 3 results) Recent Labs    09/05/24 2120  PROBNP 19,057.0*      Other results:  Imaging: US  EKG SITE RITE Result Date: [facility] If Site Rite image not attached, placement could not be confirmed due to current cardiac rhythm.  DG Chest Port 1 View Result Date: 09/05/2024 EXAM: 1 VIEW(S) XRAY OF THE CHEST 09/05/2024 08:43:00 PM COMPARISON: 08/29/2024 CLINICAL HISTORY: Shortness of breath FINDINGS: LUNGS AND PLEURA: Diffuse interstitial coarsening, increased from prior . Small bilateral pleural effusions. Patchy opacities in lung bases. No pneumothorax. HEART AND MEDIASTINUM: Heart size at upper limits. Aortic atherosclerosis. CABG markers noted. BONES AND SOFT TISSUES: Sternotomy wires noted.  No acute osseous abnormality. IMPRESSION: 1. Diffuse interstitial coarsening favoring pulmonary edema. Small bilateral pleural effusions. 2. Patchy basilar opacities favoring atelectasis. 3. Infection is difficult to exclude. Electronically signed by: Norman Gatlin MD 09/05/2024 08:49 PM EST RP Workstation: [id number]   ECHO TEE Result Date: 09/05/2024    TRANSESOPHOGEAL ECHO REPORT   Patient Name:   Monica [facility] Date of Exam: 09/05/2024 Medical Rec #:  [id number]           Height:       66.0 in Accession #:    [id number]          Weight:       173.9 lb Date of Birth:  1951/02/10           BSA:          1.885 m Patient Age:    73 years            BP:            87/78 mmHg Patient Gender: F                   HR:           99 bpm. Exam Location:  Inpatient Procedure: Cardiac Doppler, Color Doppler and Transesophageal Echo (Both            Spectral and Color Flow Doppler were utilized during procedure). Indications:     Cardioversion  History:         Patient has prior history of Echocardiogram examinations, most                  recent 07/27/2024. COPD; Arrythmias:Atrial Fibrillation.  Sonographer:     Tinnie Gosling RDCS Referring Phys:  [phone] AMY D CLEGG Diagnosing Phys: Toribio Fuel MD PROCEDURE: After discussion of the risks and benefits of a TEE, an informed consent was obtained from the patient. The transesophogeal probe was passed without difficulty through the esophogus of the patient. Sedation performed by different physician. The patient was monitored while under deep sedation. Anesthestetic sedation was provided intravenously by Anesthesiology: 50.78mg  of Propofol . The patient developed no complications during the procedure. A successful direct current cardioversion was performed with 1 attempt.  IMPRESSIONS  1. Left ventricular ejection fraction, by estimation, is 20 to 25%. The left ventricle has severely decreased function. The left ventricle demonstrates global hypokinesis. The left ventricular internal cavity size was mildly dilated.  2. Right ventricular systolic function is severely reduced. The right ventricular size is normal.  3. Left atrial size was severely dilated. No left atrial/left atrial appendage thrombus was detected.  4. Right atrial size was severely dilated.  5. There is no evidence of cardiac tamponade.  6. The mitral valve is normal in structure. Moderate mitral valve regurgitation.  7. The tricuspid valve is abnormal. Tricuspid valve regurgitation is severe.  8. The aortic valve is tricuspid. There is mild calcification of the aortic valve. Aortic valve regurgitation is trivial. No aortic stenosis is present.  9. There is  Severe (Grade IV) multilobular plaque involving the descending aorta. Conclusion(s)/Recommendation(s): No LA/LAA thrombus identified. Successful cardioversion performed with restoration of normal sinus rhythm. FINDINGS  Left Ventricle: Left ventricular ejection fraction, by estimation, is 20 to 25%. The left ventricle has severely decreased function. The left ventricle demonstrates global hypokinesis. The left ventricular internal cavity size was mildly dilated. Right Ventricle: The right ventricular size is normal. No increase in right ventricular wall thickness. Right ventricular systolic function is severely  reduced. Left Atrium: Left atrial size was severely dilated. No left atrial/left atrial appendage thrombus was detected. Right Atrium: Right atrial size was severely dilated. Pericardium: Trivial pericardial effusion is present. There is no evidence of cardiac tamponade. Mitral Valve: The mitral valve is normal in structure. Moderate mitral valve regurgitation. Tricuspid Valve: The tricuspid valve is abnormal. Tricuspid valve regurgitation is severe. Aortic Valve: The aortic valve is tricuspid. There is mild calcification of the aortic valve. Aortic valve regurgitation is trivial. No aortic stenosis is present. Pulmonic Valve: The pulmonic valve was normal in structure. Pulmonic valve regurgitation is trivial. Aorta: The aortic root is normal in size and structure. There is severe (Grade IV) multilobular plaque involving the descending aorta. IAS/Shunts: The interatrial septum appears to be lipomatous. No atrial level shunt detected by color flow Doppler. There is no evidence of a patent foramen ovale. There is no evidence of an atrial septal defect. Toribio Fuel MD Electronically signed by Toribio Fuel MD Signature Date/Time: 09/05/2024/8:40:50 [facility]    Final    EP STUDY Result Date: 09/05/2024 See surgical note for result.    Medications:     Scheduled Medications:  apixaban   5 mg Oral BID    arformoterol   15 mcg Nebulization BID   Chlorhexidine  Gluconate Cloth  6 each Topical Daily   multivitamin with minerals  1 tablet Oral Daily   mupirocin  ointment  1 Application Nasal BID   pravastatin   20 mg Oral q1800   revefenacin   175 mcg Nebulization Daily    Infusions:  sodium chloride  Stopped (09/06/24 0526)   DOBUTamine  5 mcg/kg/min (09/06/24 1100)   norepinephrine  (LEVOPHED ) Adult infusion 7 mcg/min (09/06/24 1100)    PRN Medications: acetaminophen  **OR** [DISCONTINUED] acetaminophen , bisacodyl , diazepam , menthol , ondansetron  **OR** ondansetron  (ZOFRAN ) IV, phenol, senna-docusate, traMADol    Assessment/Progress:   # Persistent A fib RVR - Last in SR 12/2023. Has had Persistent  A Fib  RVR over the last 3 months with minimal symptom until now. Previously on amio but stopped due to COPD. Tikosyn not an option with CKD.  - s/p TEE DC-CV  09/05/24.  - sinus brady today. Convert IV amio to PO. - No beta blocker d/t shock - Continue Eliquis    A/C Biventricular systolic HF HF>>cardiogenic shock -In 2024 EF improved 50-55% but most recent Echos with severely reduced biventricular HF. She had a cath with patent graphs.  - Suspect tachy mediated.  - TEE EF 20% severe RV HK. Severe TR mod MR - Decompensated overnight into 12/23. Lactic acid 5.4>3.8. Trend to clearance - Little response to IV lasix  early [facility]. Scr worse. Suspect ATN. Will hold further diuresis for now.  - Set up CVP monitoring. - Stopped beta blocker d/t shock - Additional GDMT once off pressors but may be limited by CKD   AKI on CKD Stage IIIb Creatinine baseline unclear baseline? ~ 1. 7-1.9 - Scr had improved to 1.4, up to 2 overnight in setting of shock   Chronic Respiratory Failure, Oxygen Dependent, COPD - On 2 liters continuously.  - Sats stable.    CAD - H/O CABG 2021. Cath 2024 3 of 3 patent grafts LIMA-LAD, SVG-PDA, SVG-OM 3 - no s/s angina - On statin - No aspirin  with need for  anticoagulation   Anemia  -H/O GI Bleed.  -07/2024 Colonsocopy with bleeding ulcer treated with hemoclip -Hgb stable 9.1  Hyperkalemia - In setting of shock - CMET this afternoon  CRITICAL CARE Performed by: COLLETTA MANUELITA SAILOR   Total critical care  time: 16 minutes  Critical care time was exclusive of separately billable procedures and treating other patients.  Critical care was necessary to treat or prevent imminent or life-threatening deterioration.  Critical care was time spent personally by me on the following activities: development of treatment plan with patient and/or surrogate as well as nursing, discussions with consultants, evaluation of patient's response to treatment, examination of patient, obtaining history from patient or surrogate, ordering and performing treatments and interventions, ordering and review of laboratory studies, ordering and review of radiographic studies, pulse oximetry and re-evaluation of patient's condition.   Length of Stay: 8   [facility], Monica Zamora, Monica Zam[facility]  Advanced Heart Failure Team Pager [phone] (M-F; 7a - 4p)  Please contact [facility] for night-coverage after hours (4p -7a ) and weekends on [email or web address]   Agree with above.   Moved to ICU overnight for developing shock after DC-CV earlier in the day in setting of severe biventricular dysfunction.   Now on NE and milrinone . LA is clearing. Co-ox improved. CVP up   General:  Elderly woman No resp difficulty HEENT: normal Neck: supple. JVP to jaw .  Cor: Regular rate & rhythm. 26 TR Lungs: decreased throughout Abdomen: soft, nontender, nondistended.Good bowel sounds. Extremities: no cyanosis, clubbing, rash, 1+ edema Neuro: alert & orientedx3, cranial nerves grossly intact. moves all 4 extremities w/o difficulty. Affect pleasant  She is very tenuous. Continue inotropic support for now. Diurese as tolerated.   Needs to maintain NSR. Continue amio and AC  D/w CCM at  bedside  CRITICAL CARE Performed by: Cherrie Sieving  Total critical care time: 34 minutes  Critical care time was exclusive of separately billable procedures and treating other patients.  Critical care was necessary to treat or prevent imminent or life-threatening deterioration.  Critical care was time spent personally by me (independent of midlevel providers or residents) on the following activities: development of treatment plan with patient and/or surrogate as well as nursing, discussions with consultants, evaluation of patient's response to treatment, examination of patient, obtaining history from patient or surrogate, ordering and performing treatments and interventions, ordering and review of laboratory studies, ordering and review of radiographic studies, pulse oximetry and re-evaluation of patient's condition.  Sieving Cherrie, MD  11:46 PM  "

## 2024-09-06 NOTE — Progress Notes (Addendum)
 Patient was eating dinner, she drank water, choked on it and became hypoxic into 60s Confirmed with O2 sat monitor  She complained of palpitation and shortness of breath during this Started on 5 L nasal cannula oxygen, oxygen slowly improved to 88, then she was started on nonrebreather facemask, she is feeling better now  Continue nonrebreather facemask for now, titrate with O2 sat goal 92% X-ray chest was done showing a right lower lobe infiltrate, started on IV antibiotics   Critical care time spent 20 minutes  Valinda Novas, MD

## 2024-09-06 NOTE — Progress Notes (Signed)
 Peripherally Inserted Central Catheter Placement  The IV Nurse has discussed with the patient and/or persons authorized to consent for the patient, the purpose of this procedure and the potential benefits and risks involved with this procedure.  The benefits include less needle sticks, lab draws from the catheter, and the patient may be discharged home with the catheter. Risks include, but not limited to, infection, bleeding, blood clot (thrombus formation), and puncture of an artery; nerve damage and irregular heartbeat and possibility to perform a PICC exchange if needed/ordered by physician.  Alternatives to this procedure were also discussed.  Bard Power PICC patient education guide, fact sheet on infection prevention and patient information card has been provided to patient /or left at bedside.    PICC Placement Documentation  PICC Triple Lumen 09/06/24 Right Basilic 37 cm 0 cm (Active)  Indication for Insertion or Continuance of Line Vasoactive infusions 09/06/24 1217  Exposed Catheter (cm) 0 cm 09/06/24 1217  Site Assessment Clean, Dry, Intact 09/06/24 1217  Lumen #1 Status Flushed;Saline locked;Blood return noted 09/06/24 1217  Lumen #2 Status Flushed;Saline locked;Blood return noted 09/06/24 1217  Lumen #3 Status Flushed;Saline locked;Blood return noted 09/06/24 1217  Dressing Type Transparent;Securing device 09/06/24 1217  Dressing Status Antimicrobial disc/dressing in place;Clean, Dry, Intact 09/06/24 1217  Line Care Connections checked and tightened 09/06/24 1217  Line Adjustment (NICU/IV Team Only) No 09/06/24 1217  Dressing Intervention New dressing;Adhesive placed at insertion site (IV team only) 09/06/24 1217  Dressing Change Due 09/13/24 09/06/24 1217       Jolee Na 09/06/2024, 12:18 PM

## 2024-09-06 NOTE — Treatment Plan (Addendum)
 Cardiovascular Disease Clinical Event Note:  Cardiologist on-call made aware of the patient having subacute nausea, dyspnea, cool bilateral lower extremities and concomitant hypotension.  Laboratory evaluation ultimately revealing a lactic acidosis with a lactate up to 5.4, proBNP elevated at 19,000, rising creatinine (2 from 1.4 the day prior), elevated LFTs (AST 194, ALT 81)-all of which concerning for low output heart failure.  EKG demonstrating sinus bradycardia, poor R wave progression-though no evidence of ischemia.  I evaluated the patient at the bedside.  Exam notable for cool bilateral lower extremities-though otherwise very mild jugular venous distention and bibasilar crackles appreciated on exam.  No evidence of respiratory distress-in fact she was quite comfortable appearing on my evaluation-saturating well on 4 L nasal cannula.  Discussed with hospitalist that given her evaluation is highly suspicious for low output heart failure-that she warrants an upgrade to ICU status-requesting intensivist evaluation for this transfer.  Recommendations: - Hold oral metoprolol  for now - Start dobutamine  at 5 mcg/kg/min for now - Hopefully can be weaned over the course of next day - IV Lasix  120 x 1, hold oral torsemide  for now - Continue trending lactate   Will also make her heart failure specialist Dr. Cherrie aware of this clinical change.   Dorn Kapur, MD Division of Cardiology

## 2024-09-06 NOTE — Progress Notes (Signed)
 OT Cancellation Note  Patient Details Name: Monica [facility] MRN: [id number] DOB: [birth year]   Cancelled Treatment:    Reason Eval/Treat Not Completed: Other (comment) (RN reports BP still soft and increasing medications. Will continue to follow and re-evaluate as appropriate.)  Adana Marik K, OTD, OTR/L SecureChat Preferred Acute Rehab [phone]   Monica [facility] 09/06/2024, 10:47 AM

## 2024-09-06 NOTE — Progress Notes (Signed)
 Seen on evening rounds with CCM.  Choked when drinking water this evening. Became hypoxic to the 60s. Briefly required NRB, now trialing off.   CXR concerning for aspiration. CCM starting abx.  CVP 17. Give 120 mg lasix  IV + 2.5 mg metolazone .  Manuelita Dutch, PA-C Advanced Heart Failure

## 2024-09-06 NOTE — Progress Notes (Signed)
 PT Cancellation Note  Patient Details Name: Monica [facility] MRN: [id number] DOB: 07-08-1951   Cancelled Treatment:    Reason Eval/Treat Not Completed: Medical issues which prohibited therapy this morning. RN reports BP still soft and increasing medications. Will continue to follow and re-evaluate as appropriate.   Monica [facility], PT, DPT   Acute Rehabilitation Department Office [phone] Secure Chat Communication Preferred   Monica [facility] 09/06/2024, 10:43 AM

## 2024-09-06 NOTE — Progress Notes (Signed)
 Report given to Erla, RN on [facility].  Patient's husband, Elsie notified of patient transfer.  Patient and belongings transferred in bed on telemetry with O2 to 2H23.

## 2024-09-06 NOTE — Progress Notes (Signed)
 "  NAME:  Monica Zamora, MRN:  [id number], DOB:  Dec 23, 1950, LOS: 8 ADMISSION DATE:  08/29/2024 CONSULTATION DATE: 09/05/2024 REFERRING MD:  Franky - TRH, CHIEF COMPLAINT:  SOB, hypotension   History of Present Illness:  73 year old woman who presented to [facility] 12/15 for LE edema/volume overload and SOB. PMHx significant for HTN, HLD, CAD s/p CABG (2021), HFrEF (Echo 07/2024 EF 20-25%), Afib (on Eliquis ), COPD with chronic hypoxic respiratory failure (on 2L HOT), former tobacco use, PUD/GIB (07/2024), CKD stage 3a. Recent admission 11/12-11/18 for Afib/RVR and GIB.  Patient was seen in follow up by Cardiology 12/15 and advised to present to ED for further evaluation and management of persistent AF/RVR, volume overload and urinary retention. Seen by Cardiology on ED arrival with recommendation for increased BB dosage and ongoing diuresis as tolerated. Admitted by TRH. Underwent close monitoring, attempted rate control for AF/RVR management and diuresis for management of volume overload. Developed AKI on CKD (in the setting of aggressive diuresis; this was adjusted.) GDMT limited due to kidney disease, frequent UTIs and COPD. AHF team consulted with plan for TEE/DCCV. Underwent TEE/DCCV 12/22 ([facility]) with conversion to NSR.   Post-TEE, patient reported lightheadedness and SOB; she became tachypneic with RR 28-32. CXR demonstrated diffuse interstitial coarsening favoring pulmonary edema, small bilateral pleural effusions. On 12/22PM, patient was noted to be hypotensive with SBP 80s and concern for cardiogenic shock. Transferred to 2H.  PCCM was consulted for further evaluation and management.  Pertinent Medical History:   Past Medical History:  Diagnosis Date   A-fib (HCC)    Carotid artery disease    COPD (chronic obstructive pulmonary disease) (HCC)    Coronary artery disease    Former tobacco use    Heart failure with mildly reduced ejection fraction (HFmrEF) (HCC)    Hx of CABG     Hypertension    PUD (peptic ulcer disease)    Significant Hospital Events: Including procedures, antibiotic start and stop dates in addition to other pertinent events   12/15 - Admitted to [facility] via Cardiology clinic. Cardiology formally consulted. Admitted by [facility]. 12/19 - AHF consulted. Plan for TEE/DCCV. 12/22 - Underwent TEE/DCCV with conversion to NSR. In PM, hypotensive prompting call to PCCM. Transferred to 2H  Interim History / Subjective:  Patient was transferred overnight to ICU with cardiogenic shock, currently on dobutamine  at 5 and Levophed  at 6 mcg Remained afebrile, stated feeling better On 3 L nasal cannula oxygen, denies chest pain or shortness of breath  Objective:  Blood pressure (!) 103/48, pulse (!) 187, temperature (!) 97 F (36.1 C), temperature source Axillary, resp. rate 20, height 5' 6 (1.676 m), weight 79.3 kg, SpO2 100%.        Intake/Output Summary (Last 24 hours) at 09/06/2024 0859 Last data filed at 09/06/2024 0700 Gross per 24 hour  Intake 847.54 ml  Output 200 ml  Net 647.54 ml   Filed Weights   09/05/24 0400 09/06/24 0000 09/06/24 0500  Weight: 78.9 kg 79.3 kg 79.3 kg   Physical Examination: General: Acute on chronically ill-appearing female, lying on the bed HEENT: [place]/AT, eyes anicteric.  moist mucus membranes Neuro: Alert, awake following commands Chest: Diminished air entry all over Heart: Bradycardic, regular rhythm Abdomen: Soft, nontender, nondistended, bowel sounds present Extremities: Cold to touch, left lower extremity is swollen compared to right with 1+ pitting edema (per patient it is a baseline)  Labs and images reviewed  Patient Lines/Drains/Airways Status     Active Line/Drains/Airways  Name Placement date Placement time Site Days   Peripheral IV 09/04/24 22 G 1 Left;Posterior Forearm 09/04/24  2239  Forearm  2   Peripheral IV 09/05/24 20 G Anterior;Right Wrist 09/05/24  0711  Wrist  1            Resolved  Hospital Problem List:    Assessment & Plan:  Acute on chronic biventricular HFrEF with cardiogenic shock  Lactic acidosis  Coronary artery disease Symptomatic bradycardia, likely medication induced Paroxysmal A-fib status post TEE/DCCV on 12/22 Chronic hypoxic respiratory failure on home oxygen COPD, not in exacerbation CKD stage IIIb Hyperkalemia PUD/GI bleeding, stable Shock liver Acute urinary retention, improved  Cardiology is consulting Continue dobutamine  and Levophed  to maintain MAP goal 65 Will place PICC line so we can get CVP and Coox Lactate was elevated to 5.4, now trending down at 3.8 Repeat lactate pending Not on antiplatelet agent Continue statin Remained bradycardic into 50s despite being on dobutamine  and norepinephrine  Hold amiodarone  and metoprolol  She is in sinus bradycardia Currently on 3 L nasal cannula oxygen, uses 2 L at baseline, currently O2 sat is about 95% Continue as needed nebs Continue inhalers Serum creatinine is at baseline, monitor intake and output Avoid nephrotoxic agent Serum potassium trended down from 6.1-5.4, closely monitor electrolytes Continue Protonix  AST is elevated, closely monitor   Labs:  CBC: Recent Labs  Lab 09/02/24 0828 09/04/24 0211 09/05/24 2120 09/06/24 0441  WBC 4.9 4.7 7.9 5.3  HGB 9.5* 9.2* 11.3* 9.1*  HCT 29.0* 28.2* 35.5* 28.3*  MCV 109.8* 108.5* 110.6* 111.0*  PLT 201 186 249 161   Basic Metabolic Panel: Recent Labs  Lab 09/03/24 0437 09/04/24 0211 09/05/24 0547 09/05/24 2120 09/06/24 0441  NA 138 138 138 135 135  K 3.3* 3.4* 3.5 6.1* 5.4*  CL 95* 94* 95* 93* 96*  CO2 32 35* 32 24 31  GLUCOSE 146* 105* 94 103* 103*  BUN 37* 37* 34* 37* 39*  CREATININE 1.82* 1.93* 1.46* 2.01* 2.03*  CALCIUM  8.8* 8.7* 8.8* 9.4 9.1  MG 2.0 2.0  --   --  2.2  PHOS  --  3.9  --   --   --    GFR: Estimated Creatinine Clearance: 26.6 mL/min (A) (by C-G formula based on SCr of 2.03 mg/dL (H)). Recent Labs   Lab 09/02/24 0828 09/02/24 1042 09/04/24 0211 09/05/24 2120 09/05/24 2358 09/06/24 0441  WBC 4.9  --  4.7 7.9  --  5.3  LATICACIDVEN 1.0 1.1  --  5.4* 3.8*  --    Liver Function Tests: Recent Labs  Lab 09/05/24 2120  AST 194*  ALT 81*  ALKPHOS 288*  BILITOT 1.5*  PROT 7.0  ALBUMIN 3.8   No results for input(s): LIPASE, AMYLASE in the last 168 hours. No results for input(s): AMMONIA in the last 168 hours.  ABG:    Component Value Date/Time   PHART 7.351 03/17/2023 1541   PCO2ART 46.5 03/17/2023 1541   PO2ART 87 03/17/2023 1541   HCO3 25.7 03/17/2023 1541   TCO2 19 (L) 07/27/2024 1235   O2SAT 80.7 03/25/2023 0424   Coagulation Profile: No results for input(s): INR, PROTIME in the last 168 hours.  Cardiac Enzymes: No results for input(s): CKTOTAL, CKMB, CKMBINDEX, TROPONINI in the last 168 hours.  HbA1C: No results found for: HGBA1C  CBG: No results for input(s): GLUCAP in the last 168 hours.  The patient is critically ill due to severe symptomatic bradycardia with cardiogenic shock.  Critical care  was necessary to treat or prevent imminent or life-threatening deterioration.  Critical care was time spent personally by me on the following activities: development of treatment plan with patient and/or surrogate as well as nursing, discussions with consultants, evaluation of patient's response to treatment, examination of patient, obtaining history from patient or surrogate, ordering and performing treatments and interventions, ordering and review of laboratory studies, ordering and review of radiographic studies, pulse oximetry, re-evaluation of patient's condition and participation in multidisciplinary rounds.   During this encounter critical care time was devoted to patient care services described in this note for 44 minutes.     Valinda Novas, MD [facility] See Amion for pager If no response to pager, please call [phone]  until 7pm After 7pm, Please call E-link [phone]   "

## 2024-09-07 ENCOUNTER — Encounter (HOSPITAL_COMMUNITY): Payer: Self-pay | Admitting: Internal Medicine

## 2024-09-07 DIAGNOSIS — I4819 Other persistent atrial fibrillation: Secondary | ICD-10-CM | POA: Diagnosis not present

## 2024-09-07 DIAGNOSIS — I480 Paroxysmal atrial fibrillation: Secondary | ICD-10-CM | POA: Diagnosis not present

## 2024-09-07 DIAGNOSIS — R570 Cardiogenic shock: Secondary | ICD-10-CM | POA: Diagnosis not present

## 2024-09-07 DIAGNOSIS — J690 Pneumonitis due to inhalation of food and vomit: Secondary | ICD-10-CM

## 2024-09-07 DIAGNOSIS — I130 Hypertensive heart and chronic kidney disease with heart failure and stage 1 through stage 4 chronic kidney disease, or unspecified chronic kidney disease: Secondary | ICD-10-CM | POA: Diagnosis not present

## 2024-09-07 DIAGNOSIS — I5082 Biventricular heart failure: Secondary | ICD-10-CM | POA: Diagnosis not present

## 2024-09-07 DIAGNOSIS — I5023 Acute on chronic systolic (congestive) heart failure: Secondary | ICD-10-CM | POA: Diagnosis not present

## 2024-09-07 LAB — COMPREHENSIVE METABOLIC PANEL WITH GFR
ALT: 209 U/L — ABNORMAL HIGH (ref 0–44)
AST: 252 U/L — ABNORMAL HIGH (ref 15–41)
Albumin: 3.2 g/dL — ABNORMAL LOW (ref 3.5–5.0)
Alkaline Phosphatase: 186 U/L — ABNORMAL HIGH (ref 38–126)
Anion gap: 8 (ref 5–15)
BUN: 40 mg/dL — ABNORMAL HIGH (ref 8–23)
CO2: 32 mmol/L (ref 22–32)
Calcium: 8.7 mg/dL — ABNORMAL LOW (ref 8.9–10.3)
Chloride: 91 mmol/L — ABNORMAL LOW (ref 98–111)
Creatinine, Ser: 1.96 mg/dL — ABNORMAL HIGH (ref 0.44–1.00)
GFR, Estimated: 27 mL/min — ABNORMAL LOW
Glucose, Bld: 214 mg/dL — ABNORMAL HIGH (ref 70–99)
Potassium: 4 mmol/L (ref 3.5–5.1)
Sodium: 131 mmol/L — ABNORMAL LOW (ref 135–145)
Total Bilirubin: 0.5 mg/dL (ref 0.0–1.2)
Total Protein: 5.7 g/dL — ABNORMAL LOW (ref 6.5–8.1)

## 2024-09-07 LAB — COOXEMETRY PANEL
Carboxyhemoglobin: 2.1 % — ABNORMAL HIGH (ref 0.5–1.5)
Carboxyhemoglobin: 1.8 % — ABNORMAL HIGH (ref 0.5–1.5)
Methemoglobin: 1.3 % (ref 0.0–1.5)
Methemoglobin: <0.7 % (ref 0.0–1.5)
O2 Saturation: 88.1 %
O2 Saturation: 62.9 %
Total hemoglobin: 8.9 g/dL — ABNORMAL LOW (ref 12.0–16.0)
Total hemoglobin: 8.3 g/dL — ABNORMAL LOW (ref 12.0–16.0)

## 2024-09-07 LAB — MAGNESIUM: Magnesium: 2.2 mg/dL (ref 1.7–2.4)

## 2024-09-07 LAB — CBC
HCT: 27.4 % — ABNORMAL LOW (ref 36.0–46.0)
Hemoglobin: 8.7 g/dL — ABNORMAL LOW (ref 12.0–15.0)
MCH: 35.8 pg — ABNORMAL HIGH (ref 26.0–34.0)
MCHC: 31.8 g/dL (ref 30.0–36.0)
MCV: 112.8 fL — ABNORMAL HIGH (ref 80.0–100.0)
Platelets: 156 K/uL (ref 150–400)
RBC: 2.43 MIL/uL — ABNORMAL LOW (ref 3.87–5.11)
RDW: 15 % (ref 11.5–15.5)
WBC: 5.6 K/uL (ref 4.0–10.5)
nRBC: 0 % (ref 0.0–0.2)

## 2024-09-07 LAB — GLUCOSE, CAPILLARY: Glucose-Capillary: 111 mg/dL — ABNORMAL HIGH (ref 70–99)

## 2024-09-07 MED ORDER — SODIUM CHLORIDE 0.9 % IV SOLN
2.0000 g | INTRAVENOUS | Status: AC
  Administered 2024-09-07 – 2024-09-09 (×3): 2 g via INTRAVENOUS
  Filled 2024-09-07 (×3): qty 20

## 2024-09-07 MED ORDER — AMIODARONE HCL IN DEXTROSE 360-4.14 MG/200ML-% IV SOLN
60.0000 mg/h | INTRAVENOUS | Status: AC
  Administered 2024-09-07 (×2): 60 mg/h via INTRAVENOUS
  Filled 2024-09-07: qty 400

## 2024-09-07 MED ORDER — AMIODARONE IV BOLUS ONLY 150 MG/100ML
150.0000 mg | Freq: Once | INTRAVENOUS | Status: AC
  Administered 2024-09-07: 150 mg via INTRAVENOUS

## 2024-09-07 MED ORDER — METOLAZONE 5 MG PO TABS: 5.0000 mg | ORAL_TABLET | Freq: Once | ORAL | Status: DC

## 2024-09-07 MED ORDER — FUROSEMIDE 10 MG/ML IJ SOLN
120.0000 mg | Freq: Once | INTRAVENOUS | Status: AC
  Administered 2024-09-07: 120 mg via INTRAVENOUS
  Filled 2024-09-07: qty 10

## 2024-09-07 MED ORDER — BETHANECHOL CHLORIDE 10 MG PO TABS
10.0000 mg | ORAL_TABLET | Freq: Three times a day (TID) | ORAL | Status: AC
  Administered 2024-09-07 – 2024-09-09 (×9): 10 mg via ORAL
  Filled 2024-09-07 (×9): qty 1

## 2024-09-07 MED ORDER — ACETAZOLAMIDE 250 MG PO TABS
500.0000 mg | ORAL_TABLET | Freq: Once | ORAL | Status: AC
  Administered 2024-09-07: 500 mg via ORAL
  Filled 2024-09-07: qty 2

## 2024-09-07 MED ORDER — AMIODARONE HCL IN DEXTROSE 360-4.14 MG/200ML-% IV SOLN
60.0000 mg/h | INTRAVENOUS | Status: DC
  Administered 2024-09-08 (×3): 60 mg/h via INTRAVENOUS
  Administered 2024-09-08: 30 mg/h via INTRAVENOUS
  Administered 2024-09-09 – 2024-09-12 (×14): 60 mg/h via INTRAVENOUS
  Filled 2024-09-07 (×15): qty 200

## 2024-09-07 MED ORDER — AMIODARONE HCL 200 MG PO TABS: 200.0000 mg | ORAL_TABLET | Freq: Two times a day (BID) | ORAL | Status: DC

## 2024-09-07 NOTE — Progress Notes (Signed)
Physical Therapy Treatment Patient Details Name: Monica Zamora MRN: [id number] DOB: 1951-05-16 Today's Date: 09/07/2024   History of Present Illness 73 y.o. female presents to [facility] 08/29/24 from OP cardiologist for CHF exacerbation and a-fib w/ RVR. Chest radiograph showed B hilar vascular congestion and small B pleural effusions. S/p  DCCV for AF RVR on 12/22, post-TEE developed flash pulmonary edema with hypotension, transferred to ICU. Converted back to afib RVR 12/23. PMHx: Afib, CAD s/p CABG, COPD, HFmrEF, HTN, PVD, HLD, CKD 3A,    PT Comments  The pt was agreeable to session to re-evaluate mobility after development of flash pulmonary edema and transfer to ICU. The pt is also back in afib with RVR, with rates 110-130bpm at rest prior to mobility, and HR max of 144bpm with standing activity. Pt needing minA to complete transfers and to steady with small pivotal steps at this time, pt reports mild lightheadedness with mobility, SpO2 and BP stable. Pt only able to tolerate x2 sit-stand and single bout of 5 standing marches before needing seated rest. Will continue to follow and progress mobility as tolerated, anticipate pt will be able to progress to managing household-level distances with family support.    If plan is discharge home, recommend the following: A lot of help with walking and/or transfers;A lot of help with bathing/dressing/bathroom;Assist for transportation;Help with stairs or ramp for entrance;Assistance with cooking/housework;Direct supervision/assist for medications management   Can travel by private vehicle        Equipment Recommendations  None recommended by PT    Recommendations for Other Services       Precautions / Restrictions Precautions Precautions: Fall Recall of Precautions/Restrictions: Intact Precaution/Restrictions Comments: watch HR, in afib, and on 6L O2 Restrictions Weight Bearing Restrictions Per Provider Order: No     Mobility  Bed  Mobility Overal bed mobility: Needs Assistance Bed Mobility: Supine to Sit     Supine to sit: Min assist     General bed mobility comments: MinA for slight trunk raise. minA to scoot to EOB    Transfers Overall transfer level: Needs assistance Equipment used: Rolling walker (2 wheels) Transfers: Sit to/from Stand, Bed to chair/wheelchair/BSC Sit to Stand: Min assist   Step pivot transfers: Min assist       General transfer comment: minA with cues for hand placement. HR to 145 bpm in afib. small steps with increased sway and shakiness    Ambulation/Gait               General Gait Details: deferred due to HR and pt fatigue   Stairs             Wheelchair Mobility     Tilt Bed    Modified Rankin (Stroke Patients Only)       Balance Overall balance assessment: Mild deficits observed, not formally tested, Needs assistance, History of Falls                                          Communication Communication Communication: Impaired Factors Affecting Communication: Hearing impaired  Cognition Arousal: Alert Behavior During Therapy: WFL for tasks assessed/performed   PT - Cognitive impairments: No apparent impairments                         Following commands: Intact      Cueing Cueing Techniques: Verbal cues  Exercises Other Exercises Other Exercises: standing marches x5    General Comments General comments (skin integrity, edema, etc.): HR 110-130bpm in afib at rest, high 144bpm with sit-stand and pivot steps      Pertinent Vitals/Pain Pain Assessment Pain Assessment: No/denies pain     PT Goals (current goals can now be found in the care plan section) Acute Rehab PT Goals Patient Stated Goal: to go home PT Goal Formulation: With patient Time For Goal Achievement: 10/07/2024 Potential to Achieve Goals: Good Progress towards PT goals: Progressing toward goals    Frequency    Min 1X/week        AM-PAC PT 6 Clicks Mobility   Outcome Measure  Help needed turning from your back to your side while in a flat bed without using bedrails?: A Little Help needed moving from lying on your back to sitting on the side of a flat bed without using bedrails?: A Little Help needed moving to and from a bed to a chair (including a wheelchair)?: A Little Help needed standing up from a chair using your arms (e.g., wheelchair or bedside chair)?: A Little Help needed to walk in hospital room?: A Little Help needed climbing 3-5 steps with a railing? : A Lot 6 Click Score: 17    End of Session Equipment Utilized During Treatment: Gait belt;Oxygen Activity Tolerance: Patient tolerated treatment well Patient left: with call bell/phone within reach;in chair;with chair alarm set;with nursing/sitter in room Nurse Communication: Mobility status;Other (comment) (HR) PT Visit Diagnosis: Unsteadiness on feet (R26.81);Other abnormalities of gait and mobility (R26.89);Muscle weakness (generalized) (M62.81);History of falling (Z91.81)     Time: 8944-8881 PT Time Calculation (min) (ACUTE ONLY): 23 min  Charges:    $Therapeutic Exercise: 8-22 mins $Therapeutic Activity: 8-22 mins PT General Charges $$ ACUTE PT VISIT: 1 Visit                     Izetta Call, PT, DPT   Acute Rehabilitation Department Office [phone] Secure Chat Communication Preferred   Izetta JULIANNA Call 09/07/2024, 11:46 AM

## 2024-09-07 NOTE — Anesthesia Postprocedure Evaluation (Signed)
"   Anesthesia Post Note  Patient: Monica Zamora  Procedure(s) Performed: TRANSESOPHAGEAL ECHOCARDIOGRAM CARDIOVERSION     Patient location during evaluation: Cath Lab Anesthesia Type: MAC Level of consciousness: awake and alert Pain management: pain level controlled Vital Signs Assessment: post-procedure vital signs reviewed and stable Respiratory status: spontaneous breathing, nonlabored ventilation and respiratory function stable Cardiovascular status: blood pressure returned to baseline and stable Postop Assessment: no apparent nausea or vomiting Anesthetic complications: no   No notable events documented.                Jasmin Trumbull      "

## 2024-09-07 NOTE — Progress Notes (Signed)
 "  NAME:  Monica Zamora, MRN:  [id number], DOB:  [birth year], LOS: 9 ADMISSION DATE:  08/29/2024 CONSULTATION DATE: 09/05/2024 REFERRING MD:  Franky - TRH, CHIEF COMPLAINT:  SOB, hypotension   History of Present Illness:  73 year old woman who presented to [facility] 12/15 for LE edema/volume overload and SOB. PMHx significant for HTN, HLD, CAD s/p CABG (2021), HFrEF (Echo 07/2024 EF 20-25%), Afib (on Eliquis ), COPD with chronic hypoxic respiratory failure (on 2L HOT), former tobacco use, PUD/GIB (07/2024), CKD stage 3a. Recent admission 11/12-11/18 for Afib/RVR and GIB.  Patient was seen in follow up by Cardiology 12/15 and advised to present to ED for further evaluation and management of persistent AF/RVR, volume overload and urinary retention. Seen by Cardiology on ED arrival with recommendation for increased BB dosage and ongoing diuresis as tolerated. Admitted by TRH. Underwent close monitoring, attempted rate control for AF/RVR management and diuresis for management of volume overload. Developed AKI on CKD (in the setting of aggressive diuresis; this was adjusted.) GDMT limited due to kidney disease, frequent UTIs and COPD. AHF team consulted with plan for TEE/DCCV. Underwent TEE/DCCV 12/22 ([facility]) with conversion to NSR.   Post-TEE, patient reported lightheadedness and SOB; she became tachypneic with RR 28-32. CXR demonstrated diffuse interstitial coarsening favoring pulmonary edema, small bilateral pleural effusions. On 12/22PM, patient was noted to be hypotensive with SBP 80s and concern for cardiogenic shock. Transferred to 2H.  PCCM was consulted for further evaluation and management.  Pertinent Medical History:   Past Medical History:  Diagnosis Date   A-fib (HCC)    Carotid artery disease    COPD (chronic obstructive pulmonary disease) (HCC)    Coronary artery disease    Former tobacco use    Heart failure with mildly reduced ejection fraction (HFmrEF) (HCC)    Hx of CABG     Hypertension    PUD (peptic ulcer disease)    Significant Hospital Events: Including procedures, antibiotic start and stop dates in addition to other pertinent events   12/15 - Admitted to [facility] via Cardiology clinic. Cardiology formally consulted. Admitted by [facility]. 12/19 - AHF consulted. Plan for TEE/DCCV. 12/22 - Underwent TEE/DCCV with conversion to NSR. In PM, hypotensive prompting call to PCCM. Transferred to [facility] 12/23 remain on dobutamine  and Levophed , not making much urine was given Lasix  and metolazone  in the evening.  Remain on 2 to 3 L nasal cannula oxygen.  In the evening she choked after drinking water, she became hypoxic into 60s, requiring nonrebreather facemask  Interim History / Subjective:  Patient stated she is feeling better Back on 2 L nasal cannula oxygen which is significantly improved from last evening Remain on 5 mics of dobutamine  and Levophed  is at 8 mics Converted back to A-fib with RVR into 120s  Afebrile  Objective:  Blood pressure (!) 104/57, pulse 98, temperature (!) 97.4 F (36.3 C), temperature source Axillary, resp. rate 17, height 5' 6 (1.676 m), weight 79 kg, SpO2 100%. CVP:  [11 mmHg-20 mmHg] 11 mmHg      Intake/Output Summary (Last 24 hours) at 09/07/2024 9092 Last data filed at 09/07/2024 0800 Gross per 24 hour  Intake 1069.74 ml  Output 2015 ml  Net -945.26 ml   Filed Weights   09/06/24 0000 09/06/24 0500 09/07/24 0500  Weight: 79.3 kg 79.3 kg 79 kg   Physical Examination: General: Elderly female, lying on the bed HEENT: [place]/AT, eyes anicteric.  moist mucus membranes Neuro: Alert, awake following commands Chest: Reduced air entry at the  bases bilaterally, no wheezes Heart: Irregularly irregular, tachycardic Abdomen: Soft, nontender, nondistended, bowel sounds present Ext: Left lower leg edema noted, asymmetric ( per patient's baseline)  Labs reviewed  Patient Lines/Drains/Airways Status     Active Line/Drains/Airways     Name  Placement date Placement time Site Days   PICC Triple Lumen 09/06/24 Right Basilic 37 cm 0 cm 09/06/24  8782  -- 1   Urethral Catheter Morna Hock RN 14 Fr. 09/06/24  1700  --  1            Resolved Hospital Problem List:  Lactic acidosis Hyperkalemia  Assessment & Plan:  Acute on chronic biventricular HFrEF with cardiogenic shock  Coronary artery disease Symptomatic bradycardia, likely medication induced, resolved Paroxysmal A-fib status post TEE/DCCV on 12/22, back in A-fib Chronic hypoxic respiratory failure on home oxygen on 2 L/min Aspiration pneumonitis COPD, not in exacerbation CKD stage IIIb Hypervolemic hyponatremia PUD/GI bleeding, stable Shock liver Acute urinary retention  Cardiology/heart failure team is following Continue dobutamine  and Levophed  to maintain MAP goal 65, currently on 5 mics of dobutamine  and 8 mics of Levophed  Coox is 88% this morning, will repeat, yesterday it was 61% Lactate has cleared Not on antiplatelet agent Continue statin Went back into A-fib overnight Will give amiodarone  bolus Increase oral amiodarone  to 200 mg twice daily Start the evening patient aspirated on water became hypoxic requiring nonrebreather facemask, currently back on 2 L nasal cannula oxygen Switch antibiotics to ceftriaxone  to complete 3 days therapy, I think patient has aspiration pneumonitis with rapid improvement doubt it is aspiration pneumonia Continue as needed nebs Continue inhalers Serum creatinine is stable\ Monitor intake and output Avoid nephrotoxic agent Serum potassium is corrected to 4.1 Continue Protonix  LFTs continue to improve   Labs:  CBC: Recent Labs  Lab 09/02/24 0828 09/04/24 0211 09/05/24 2120 09/06/24 0441 09/07/24 0414  WBC 4.9 4.7 7.9 5.3 5.6  HGB 9.5* 9.2* 11.3* 9.1* 8.7*  HCT 29.0* 28.2* 35.5* 28.3* 27.4*  MCV 109.8* 108.5* 110.6* 111.0* 112.8*  PLT 201 186 249 161 156   Basic Metabolic Panel: Recent Labs  Lab  09/03/24 0437 09/04/24 0211 09/05/24 0547 09/05/24 2120 09/06/24 0441 09/06/24 1415 09/07/24 0414  NA 138 138 138 135 135 132* 131*  K 3.3* 3.4* 3.5 6.1* 5.4* 4.7 4.0  CL 95* 94* 95* 93* 96* 93* 91*  CO2 32 35* 32 24 31 32 32  GLUCOSE 146* 105* 94 103* 103* 132* 214*  BUN 37* 37* 34* 37* 39* 42* 40*  CREATININE 1.82* 1.93* 1.46* 2.01* 2.03* 2.06* 1.96*  CALCIUM  8.8* 8.7* 8.8* 9.4 9.1 8.9 8.7*  MG 2.0 2.0  --   --  2.2  --  2.2  PHOS  --  3.9  --   --   --   --   --    GFR: Estimated Creatinine Clearance: 27.5 mL/min (A) (by C-G formula based on SCr of 1.96 mg/dL (H)). Recent Labs  Lab 09/04/24 0211 09/05/24 2120 09/05/24 2358 09/06/24 0441 09/06/24 1414 09/06/24 1415 09/07/24 0414  WBC 4.7 7.9  --  5.3  --   --  5.6  LATICACIDVEN  --  5.4* 3.8*  --  1.2 0.8  --    Liver Function Tests: Recent Labs  Lab 09/05/24 2120 09/06/24 1415 09/07/24 0414  AST 194* 431* 252*  ALT 81* 238* 209*  ALKPHOS 288* 209* 186*  BILITOT 1.5* 0.6 0.5  PROT 7.0 5.9* 5.7*  ALBUMIN 3.8 3.2* 3.2*  No results for input(s): LIPASE, AMYLASE in the last 168 hours. No results for input(s): AMMONIA in the last 168 hours.  ABG:    Component Value Date/Time   PHART 7.351 03/17/2023 1541   PCO2ART 46.5 03/17/2023 1541   PO2ART 87 03/17/2023 1541   HCO3 25.7 03/17/2023 1541   TCO2 19 (L) 07/27/2024 1235   O2SAT 88.1 09/07/2024 0414   Coagulation Profile: No results for input(s): INR, PROTIME in the last 168 hours.  Cardiac Enzymes: No results for input(s): CKTOTAL, CKMB, CKMBINDEX, TROPONINI in the last 168 hours.  HbA1C: No results found for: HGBA1C  CBG: Recent Labs  Lab 09/07/24 0627  GLUCAP 111*    The patient is critically ill due to recurrent A-fib with cardiogenic shock due to biventricular heart failure.  Critical care was necessary to treat or prevent imminent or life-threatening deterioration.  Critical care was time spent personally by me on the  following activities: development of treatment plan with patient and/or surrogate as well as nursing, discussions with consultants, evaluation of patient's response to treatment, examination of patient, obtaining history from patient or surrogate, ordering and performing treatments and interventions, ordering and review of laboratory studies, ordering and review of radiographic studies, pulse oximetry, re-evaluation of patient's condition and participation in multidisciplinary rounds.   During this encounter critical care time was devoted to patient care services described in this note for 39 minutes.     Valinda Novas, MD [place] [facility] See Amion for pager If no response to pager, please call [phone] until 7pm After 7pm, Please call E-link [phone]   "

## 2024-09-07 NOTE — Progress Notes (Addendum)
 "   Advanced Heart Failure Rounding Note  [facility] Cardiologist: Dr. Zenaida Chief Complaint: Shock Subjective:    12/22: DCCV for AF RVR. Post-TEE developed flash pulmonary edema with hypotension.   Back in AF overnight.   Co-ox 88%, on 8 NE + 5 DBA. CVP 14-15 Net negative 1L. Weight relatively unchanged. sCr 2.06>1.96 LA cleared. O2 supp down to 2L  Sitting up in bed. Reports she feels wonderful. No SOB or palpitations. Unaware of AF.   Objective:    Vital Signs:   Temp:  [96.9 F (36.1 C)-97.4 F (36.3 C)] 97.4 F (36.3 C) (12/24 0700) Pulse Rate:  [55-128] 98 (12/24 0800) Resp:  [11-30] 17 (12/24 0800) BP: (77-124)/(41-87) 104/57 (12/24 0800) SpO2:  [89 %-100 %] 100 % (12/24 0814) Weight:  [79 kg] 79 kg (12/24 0500) Last BM Date : 09/02/24  Weight change: Filed Weights   09/06/24 0000 09/06/24 0500 09/07/24 0500  Weight: 79.3 kg 79.3 kg 79 kg   Intake/Output:  Intake/Output Summary (Last 24 hours) at 09/07/2024 0948 Last data filed at 09/07/2024 0800 Gross per 24 hour  Intake 1069.74 ml  Output 2015 ml  Net -945.26 ml    Physical Exam: General: Chronically ill appearing. No distress  Cardiac: JVP ~8cm. No murmurs  Extremities: Warm and dry.  No peripheral edema.  Neuro: A&O x3. Affect pleasant.   Telemetry: AF 110-120s (personally reviewed)  Labs: Basic Metabolic Panel: Recent Labs  Lab 09/03/24 0437 09/04/24 0211 09/05/24 0547 09/05/24 2120 09/06/24 0441 09/06/24 1415 09/07/24 0414  NA 138 138 138 135 135 132* 131*  K 3.3* 3.4* 3.5 6.1* 5.4* 4.7 4.0  CL 95* 94* 95* 93* 96* 93* 91*  CO2 32 35* 32 24 31 32 32  GLUCOSE 146* 105* 94 103* 103* 132* 214*  BUN 37* 37* 34* 37* 39* 42* 40*  CREATININE 1.82* 1.93* 1.46* 2.01* 2.03* 2.06* 1.96*  CALCIUM  8.8* 8.7* 8.8* 9.4 9.1 8.9 8.7*  MG 2.0 2.0  --   --  2.2  --  2.2  PHOS  --  3.9  --   --   --   --   --    Liver Function Tests: Recent Labs  Lab 09/05/24 2120 09/06/24 1415 09/07/24 0414  AST  194* 431* 252*  ALT 81* 238* 209*  ALKPHOS 288* 209* 186*  BILITOT 1.5* 0.6 0.5  PROT 7.0 5.9* 5.7*  ALBUMIN 3.8 3.2* 3.2*   CBC: Recent Labs  Lab 09/02/24 0828 09/04/24 0211 09/05/24 2120 09/06/24 0441 09/07/24 0414  WBC 4.9 4.7 7.9 5.3 5.6  HGB 9.5* 9.2* 11.3* 9.1* 8.7*  HCT 29.0* 28.2* 35.5* 28.3* 27.4*  MCV 109.8* 108.5* 110.6* 111.0* 112.8*  PLT 201 186 249 161 156   BNP (last 3 results) Recent Labs    07/27/24 0930 08/29/24 1719  BNP 1,340.4* 2,397.9*   ProBNP (last 3 results) Recent Labs    09/05/24 2120  PROBNP 19,057.0*   Medications:    Scheduled Medications:  amiodarone   200 mg Oral BID   apixaban   5 mg Oral BID   arformoterol   15 mcg Nebulization BID   bethanechol   10 mg Oral TID   Chlorhexidine  Gluconate Cloth  6 each Topical Daily   metolazone   5 mg Oral Once   multivitamin with minerals  1 tablet Oral Daily   mupirocin  ointment  1 Application Nasal BID   pravastatin   20 mg Oral q1800   revefenacin   175 mcg Nebulization Daily   sodium chloride   flush  10-40 mL Intracatheter Q12H    Infusions:  amiodarone      cefTRIAXone  (ROCEPHIN )  IV     DOBUTamine  5 mcg/kg/min (09/07/24 0800)   furosemide      norepinephrine  (LEVOPHED ) Adult infusion 8 mcg/min (09/07/24 0800)    PRN Medications: acetaminophen  **OR** [DISCONTINUED] acetaminophen , bisacodyl , diazepam , menthol , ondansetron  **OR** ondansetron  (ZOFRAN ) IV, phenol, senna-docusate, sodium chloride  flush, traMADol   Assessment/Progress:   # Persistent A fib RVR - Last in SR 12/2023. Has had Persistent  A Fib  RVR over the last 3 months with minimal symptom until now. Previously on amio but stopped due to COPD. Tikosyn not an option with CKD.  - s/p TEE DC-CV 09/05/24 - No beta blocker d/t shock - back in AF early this morning. Rates 110-120s - restart IV amio gtt - continue eliquis  5mg  bid   A/C Biventricular systolic HF HF>>cardiogenic shock - In 2024 EF improved 50-55% but most recent  Echos with severely reduced biventricular HF. She had a cath with patent graphs.  - Suspect tachy mediated.  - TEE EF 20% severe RV HK. Severe TR mod MR - Decompensated overnight into 12/23. Lactic acid 5.4>3.8> 0.8 now clear - CVP 14-15. Give IV lasix  120 mg with diamox  500 mg once. - Co-ox 88% on DBA 5. With return to AF RVR, decrease DBA to 2.5 - GDMT limited by hypotension and renal dysfunction   AKI on CKD Stage IIIb - Creatinine baseline unclear baseline? ~ 1. 7-1.9 - Scr had improved to 1.4, now stable at 2 - clear UOP   Chronic Respiratory Failure, Oxygen Dependent, COPD - On 2L [place] - Sats stable.    CAD - H/O CABG 2021. Cath 2024 3 of 3 patent grafts LIMA-LAD, SVG-PDA, SVG-OM 3 - no s/s angina - On statin - No aspirin  with need for anticoagulation   Anemia  - H/O GI Bleed.  - 11/25 Colonsocopy with bleeding ulcer treated with hemoclip - Hgb stable   Hyperkalemia - In setting of shock - supp as needed  Transaminitis - suspect liver shock - CTM LFTs  CRITICAL CARE Performed by: Jordan Lee  Total critical care time: 16 minutes  -Critical care time was exclusive of separately billable procedures and treating other patients. -Critical care was necessary to treat or prevent imminent or life-threatening deterioration. -Critical care was time spent personally by me on the following activities: development of treatment plan with patient and/or surrogate as well as nursing, discussions with consultants, evaluation of patient's response to treatment, examination of patient, obtaining history from patient or surrogate, ordering and performing treatments and interventions, ordering and review of laboratory studies, ordering and review of radiographic studies, pulse oximetry and re-evaluation of patient's condition.   Length of Stay: 71  Jordan Lee 09/07/2024, 9:48 AM  Advanced Heart Failure Team Pager [phone] (M-F; 7a - 4p)  Please contact [facility] for  night-coverage after hours (4p -7a ) and weekends on [email or web address]   Agree with above.   Back in AF overnight.   Now on NE 8 and DBA 5 CVP elevated  Feels ok. Scr mildly elevated from baseline  General:  Sitting up in chair No resp difficulty HEENT: normal Neck: supple. JVP to jaw  Cor: Irregular rate & rhythm. No rubs, gallops or murmurs. Lungs: coarse  Abdomen: soft, nontender, nondistended.Good bowel sounds. Extremities: no cyanosis, clubbing, rash, tr edema Neuro: alert & orientedx3, cranial nerves grossly intact. moves all 4 extremities w/o difficulty. Affect pleasant  She remains very tenuous. Developed  recurrent cardiogenic shock after DC-CV. Now on NE and DBA.   Will cut back DBA. Continue IV diurese. Restart IV amio. Will need repeat DC-CV if she doesn't convert.  CRITICAL CARE Performed by: Cherrie Sieving  Total critical care time: 37 minutes  Critical care time was exclusive of separately billable procedures and treating other patients.  Critical care was necessary to treat or prevent imminent or life-threatening deterioration.  Critical care was time spent personally by me (independent of midlevel providers or residents) on the following activities: development of treatment plan with patient and/or surrogate as well as nursing, discussions with consultants, evaluation of patient's response to treatment, examination of patient, obtaining history from patient or surrogate, ordering and performing treatments and interventions, ordering and review of laboratory studies, ordering and review of radiographic studies, pulse oximetry and re-evaluation of patient's condition.  Sieving Cherrie, MD  6:01 PM      "

## 2024-09-07 NOTE — Plan of Care (Signed)
  Problem: Clinical Measurements: Goal: Will remain free from infection Outcome: Progressing Goal: Diagnostic test results will improve Outcome: Progressing Goal: Respiratory complications will improve Outcome: Progressing Goal: Cardiovascular complication will be avoided Outcome: Progressing   Problem: Activity: Goal: Risk for activity intolerance will decrease Outcome: Progressing   Problem: Coping: Goal: Level of anxiety will decrease Outcome: Progressing   

## 2024-09-08 DIAGNOSIS — I4819 Other persistent atrial fibrillation: Secondary | ICD-10-CM | POA: Diagnosis not present

## 2024-09-08 DIAGNOSIS — R570 Cardiogenic shock: Secondary | ICD-10-CM | POA: Diagnosis not present

## 2024-09-08 DIAGNOSIS — I130 Hypertensive heart and chronic kidney disease with heart failure and stage 1 through stage 4 chronic kidney disease, or unspecified chronic kidney disease: Secondary | ICD-10-CM | POA: Diagnosis not present

## 2024-09-08 DIAGNOSIS — I480 Paroxysmal atrial fibrillation: Secondary | ICD-10-CM | POA: Diagnosis not present

## 2024-09-08 DIAGNOSIS — I5023 Acute on chronic systolic (congestive) heart failure: Secondary | ICD-10-CM | POA: Diagnosis not present

## 2024-09-08 DIAGNOSIS — I5082 Biventricular heart failure: Secondary | ICD-10-CM | POA: Diagnosis not present

## 2024-09-08 LAB — COMPREHENSIVE METABOLIC PANEL WITH GFR
ALT: 158 U/L — ABNORMAL HIGH (ref 0–44)
AST: 130 U/L — ABNORMAL HIGH (ref 15–41)
Albumin: 3.1 g/dL — ABNORMAL LOW (ref 3.5–5.0)
Alkaline Phosphatase: 174 U/L — ABNORMAL HIGH (ref 38–126)
Anion gap: 9 (ref 5–15)
BUN: 36 mg/dL — ABNORMAL HIGH (ref 8–23)
CO2: 34 mmol/L — ABNORMAL HIGH (ref 22–32)
Calcium: 8.4 mg/dL — ABNORMAL LOW (ref 8.9–10.3)
Chloride: 91 mmol/L — ABNORMAL LOW (ref 98–111)
Creatinine, Ser: 2.06 mg/dL — ABNORMAL HIGH (ref 0.44–1.00)
GFR, Estimated: 25 mL/min — ABNORMAL LOW
Glucose, Bld: 203 mg/dL — ABNORMAL HIGH (ref 70–99)
Potassium: 3.2 mmol/L — ABNORMAL LOW (ref 3.5–5.1)
Sodium: 133 mmol/L — ABNORMAL LOW (ref 135–145)
Total Bilirubin: 0.3 mg/dL (ref 0.0–1.2)
Total Protein: 5.6 g/dL — ABNORMAL LOW (ref 6.5–8.1)

## 2024-09-08 LAB — BASIC METABOLIC PANEL WITH GFR
Anion gap: 9 (ref 5–15)
BUN: 34 mg/dL — ABNORMAL HIGH (ref 8–23)
CO2: 34 mmol/L — ABNORMAL HIGH (ref 22–32)
Calcium: 8.6 mg/dL — ABNORMAL LOW (ref 8.9–10.3)
Chloride: 94 mmol/L — ABNORMAL LOW (ref 98–111)
Creatinine, Ser: 1.96 mg/dL — ABNORMAL HIGH (ref 0.44–1.00)
GFR, Estimated: 27 mL/min — ABNORMAL LOW
Glucose, Bld: 136 mg/dL — ABNORMAL HIGH (ref 70–99)
Potassium: 4 mmol/L (ref 3.5–5.1)
Sodium: 138 mmol/L (ref 135–145)

## 2024-09-08 LAB — COOXEMETRY PANEL
Carboxyhemoglobin: 1.9 % — ABNORMAL HIGH (ref 0.5–1.5)
Carboxyhemoglobin: 1.6 % — ABNORMAL HIGH (ref 0.5–1.5)
Methemoglobin: <0.7 % (ref 0.0–1.5)
Methemoglobin: <0.7 % (ref 0.0–1.5)
O2 Saturation: 76.5 %
O2 Saturation: 66.7 %
Total hemoglobin: 8.7 g/dL — ABNORMAL LOW (ref 12.0–16.0)
Total hemoglobin: 8.5 g/dL — ABNORMAL LOW (ref 12.0–16.0)

## 2024-09-08 LAB — GLUCOSE, CAPILLARY
Glucose-Capillary: 106 mg/dL — ABNORMAL HIGH (ref 70–99)
Glucose-Capillary: 121 mg/dL — ABNORMAL HIGH (ref 70–99)
Glucose-Capillary: 122 mg/dL — ABNORMAL HIGH (ref 70–99)

## 2024-09-08 MED ORDER — MILRINONE LACTATE IN DEXTROSE 20-5 MG/100ML-% IV SOLN
0.1250 ug/kg/min | INTRAVENOUS | Status: DC
  Administered 2024-09-08 – 2024-09-09 (×2): 0.125 ug/kg/min via INTRAVENOUS
  Administered 2024-09-11: 0.25 ug/kg/min via INTRAVENOUS
  Administered 2024-09-12: 0.125 ug/kg/min via INTRAVENOUS
  Filled 2024-09-08 (×3): qty 100

## 2024-09-08 MED ORDER — FUROSEMIDE 10 MG/ML IJ SOLN
80.0000 mg | Freq: Once | INTRAMUSCULAR | Status: AC
  Administered 2024-09-08: 80 mg via INTRAVENOUS
  Filled 2024-09-08: qty 8

## 2024-09-08 MED ORDER — AMIODARONE LOAD VIA INFUSION
150.0000 mg | Freq: Once | INTRAVENOUS | Status: AC
  Administered 2024-09-08: 150 mg via INTRAVENOUS
  Filled 2024-09-08: qty 83.34

## 2024-09-08 MED ORDER — POTASSIUM CHLORIDE CRYS ER 20 MEQ PO TBCR
40.0000 meq | EXTENDED_RELEASE_TABLET | Freq: Once | ORAL | Status: AC
  Administered 2024-09-08: 40 meq via ORAL
  Filled 2024-09-08: qty 2

## 2024-09-08 MED ORDER — ACETAZOLAMIDE SODIUM 500 MG IJ SOLR
500.0000 mg | Freq: Once | INTRAMUSCULAR | Status: AC
  Administered 2024-09-08: 500 mg via INTRAVENOUS
  Filled 2024-09-08: qty 500

## 2024-09-08 MED ORDER — FUROSEMIDE 10 MG/ML IJ SOLN
10.0000 mg/h | INTRAVENOUS | Status: AC
  Administered 2024-09-08 – 2024-09-10 (×4): 10 mg/h via INTRAVENOUS
  Filled 2024-09-08 (×4): qty 20
  Filled 2024-09-08: qty 200

## 2024-09-08 NOTE — Progress Notes (Signed)
 "  NAME:  Monica Zamora, MRN:  [id number], DOB:  Nov 20, 1950, LOS: 10 ADMISSION DATE:  08/29/2024 CONSULTATION DATE: 09/05/2024 REFERRING MD:  Franky - [facility], CHIEF COMPLAINT:  SOB, hypotension   History of Present Illness:  72 year old woman who presented to [facility] 12/15 for LE edema/volume overload and SOB. PMHx significant for HTN, HLD, CAD s/p CABG (2021), HFrEF (Echo 07/2024 EF 20-25%), Afib (on Eliquis ), COPD with chronic hypoxic respiratory failure (on 2L HOT), former tobacco use, PUD/GIB (07/2024), CKD stage 3a. Recent admission 11/12-11/18 for Afib/RVR and GIB.  Patient was seen in follow up by Cardiology 12/15 and advised to present to ED for further evaluation and management of persistent AF/RVR, volume overload and urinary retention. Seen by Cardiology on ED arrival with recommendation for increased BB dosage and ongoing diuresis as tolerated. Admitted by [facility]. Underwent close monitoring, attempted rate control for AF/RVR management and diuresis for management of volume overload. Developed AKI on CKD (in the setting of aggressive diuresis; this was adjusted.) GDMT limited due to kidney disease, frequent UTIs and COPD. AHF team consulted with plan for TEE/DCCV. Underwent TEE/DCCV 12/22 ([place]) with conversion to NSR.   Post-TEE, patient reported lightheadedness and SOB; she became tachypneic with RR 28-32. CXR demonstrated diffuse interstitial coarsening favoring pulmonary edema, small bilateral pleural effusions. On 12/22PM, patient was noted to be hypotensive with SBP 80s and concern for cardiogenic shock. Transferred to 2H.  PCCM was consulted for further evaluation and management.  Pertinent Medical History:   Past Medical History:  Diagnosis Date   A-fib (HCC)    Carotid artery disease    COPD (chronic obstructive pulmonary disease) (HCC)    Coronary artery disease    Former tobacco use    Heart failure with mildly reduced ejection fraction (HFmrEF) (HCC)    Hx of CABG     Hypertension    PUD (peptic ulcer disease)    Significant Hospital Events: Including procedures, antibiotic start and stop dates in addition to other pertinent events   12/15 - Admitted to [facility] via Cardiology clinic. Cardiology formally consulted. Admitted by [facility] [place]. 12/19 - AHF consulted. Plan for TEE/DCCV. 12/22 - Underwent TEE/DCCV with conversion to NSR. In PM, hypotensive prompting call to PCCM. Transferred to [facility] 12/23 remain on dobutamine  and Levophed , not making much urine was given Lasix  and metolazone  in the evening.  Remain on 2 to 3 L nasal cannula oxygen.  In the evening she choked after drinking water, she became hypoxic into 60s, requiring nonrebreather facemask 12/24 back on 2 L nasal cannula oxygen, baseline, remained on dobutamine  at 5 and Levophed  at 8.  Went back into A-fib with RVR heart rate ranging 120s, started on amiodarone   Interim History / Subjective:  Patient stated feeling much better Remains on 2 L nasal cannula oxygen Still on dobutamine  at 2.5 and norepinephrine  at 5 Continue to remain in A-fib on amiodarone  infusion   Objective:  Blood pressure 108/62, pulse (!) 109, temperature 98 F (36.7 C), temperature source Oral, resp. rate (!) 25, height 5' 6 (1.676 m), weight 76.1 kg, SpO2 98%. CVP:  [8 mmHg-23 mmHg] 10 mmHg      Intake/Output Summary (Last 24 hours) at 09/08/2024 0931 Last data filed at 09/08/2024 0700 Gross per 24 hour  Intake 1127.48 ml  Output 2160 ml  Net -1032.52 ml   Filed Weights   09/06/24 0500 09/07/24 0500 09/08/24 0500  Weight: 79.3 kg 79 kg 76.1 kg   Physical Examination: General: Acute on chronically ill-appearing female,  lying on the bed HEENT: [place]/AT, eyes anicteric.  moist mucus membranes Neuro: Alert, awake following commands Chest: Clear to auscultation bilaterally Heart: Irregularly irregular, tachycardic no murmurs or gallops Abdomen: Soft, nontender, nondistended, bowel sounds present Extremities: Bilateral leg  edema left more than right (per patient's baseline)   Labs reviewed  Patient Lines/Drains/Airways Status     Active Line/Drains/Airways     Name Placement date Placement time Site Days   PICC Triple Lumen 09/06/24 Right Basilic 37 cm 0 cm 09/06/24  8782  -- 2   Urethral Catheter Morna Hock RN 14 Fr. 09/06/24  1700  --  2            Resolved Hospital Problem List:  Lactic acidosis Hyperkalemia Symptomatic bradycardia, likely medication induced  Assessment & Plan:  Acute on chronic biventricular HFrEF with cardiogenic shock  Coronary artery disease Paroxysmal A-fib status post TEE/DCCV on 12/22, back in A-fib with RVR Chronic hypoxic respiratory failure on home oxygen on 2 L/min Aspiration pneumonitis COPD, not in exacerbation CKD stage IIIb Hypervolemic hyponatremia/hypokalemia PUD/GI bleeding, stable Shock liver, improving Acute urinary retention, on bethanechol   Cardiology/heart failure team is following This morning patient was on dobutamine  2.5 and Levophed  at 5 mics, will switch dobutamine  to milrinone  at 0.125, continue Levophed  titrate with MAP goal 65 Co is 66%ox  Not on antiplatelet agent Continue statin Remain in A-fib with RVR, will give amiodarone  bolus and then increase drip to 60 mg She may need repeat cardioversion once diuresed well Back on 2 L nasal cannula oxygen Continue ceftriaxone  to complete 5 days therapy Serum creatinine is stable, monitor intake and output Avoid nephrotoxic agent Remain net negative almost 2 L in last 24 hours continue aggressive electrolyte replacement and monitor Continue Protonix  Will start Lasix  infusion Continue as needed Diamox  to help correct metabolic alkalosis LFTs continue to improve Continue bethanechol , will do voiding trial by tomorrow    Labs:  CBC: Recent Labs  Lab 09/02/24 0828 09/04/24 0211 09/05/24 2120 09/06/24 0441 09/07/24 0414  WBC 4.9 4.7 7.9 5.3 5.6  HGB 9.5* 9.2* 11.3* 9.1* 8.7*   HCT 29.0* 28.2* 35.5* 28.3* 27.4*  MCV 109.8* 108.5* 110.6* 111.0* 112.8*  PLT 201 186 249 161 156   Basic Metabolic Panel: Recent Labs  Lab 09/03/24 0437 09/04/24 0211 09/05/24 0547 09/05/24 2120 09/06/24 0441 09/06/24 1415 09/07/24 0414 09/08/24 0414  NA 138 138   < > 135 135 132* 131* 133*  K 3.3* 3.4*   < > 6.1* 5.4* 4.7 4.0 3.2*  CL 95* 94*   < > 93* 96* 93* 91* 91*  CO2 32 35*   < > 24 31 32 32 34*  GLUCOSE 146* 105*   < > 103* 103* 132* 214* 203*  BUN 37* 37*   < > 37* 39* 42* 40* 36*  CREATININE 1.82* 1.93*   < > 2.01* 2.03* 2.06* 1.96* 2.06*  CALCIUM  8.8* 8.7*   < > 9.4 9.1 8.9 8.7* 8.4*  MG 2.0 2.0  --   --  2.2  --  2.2  --   PHOS  --  3.9  --   --   --   --   --   --    < > = values in this interval not displayed.   GFR: Estimated Creatinine Clearance: 25.7 mL/min (A) (by C-G formula based on SCr of 2.06 mg/dL (H)). Recent Labs  Lab 09/04/24 0211 09/05/24 2120 09/05/24 2358 09/06/24 0441 09/06/24 1414 09/06/24 1415  09/07/24 0414  WBC 4.7 7.9  --  5.3  --   --  5.6  LATICACIDVEN  --  5.4* 3.8*  --  1.2 0.8  --    Liver Function Tests: Recent Labs  Lab 09/05/24 2120 09/06/24 1415 09/07/24 0414 09/08/24 0414  AST 194* 431* 252* 130*  ALT 81* 238* 209* 158*  ALKPHOS 288* 209* 186* 174*  BILITOT 1.5* 0.6 0.5 0.3  PROT 7.0 5.9* 5.7* 5.6*  ALBUMIN 3.8 3.2* 3.2* 3.1*   No results for input(s): LIPASE, AMYLASE in the last 168 hours. No results for input(s): AMMONIA in the last 168 hours.  ABG:    Component Value Date/Time   PHART 7.351 03/17/2023 1541   PCO2ART 46.5 03/17/2023 1541   PO2ART 87 03/17/2023 1541   HCO3 25.7 03/17/2023 1541   TCO2 19 (L) 07/27/2024 1235   O2SAT 66.7 09/08/2024 0537   Coagulation Profile: No results for input(s): INR, PROTIME in the last 168 hours.  Cardiac Enzymes: No results for input(s): CKTOTAL, CKMB, CKMBINDEX, TROPONINI in the last 168 hours.  HbA1C: No results found for:  HGBA1C  CBG: Recent Labs  Lab 09/07/24 0627  GLUCAP 111*    The patient is critically ill due to recurrent A-fib with cardiogenic shock due to biventricular heart failure.  Critical care was necessary to treat or prevent imminent or life-threatening deterioration.  Critical care was time spent personally by me on the following activities: development of treatment plan with patient and/or surrogate as well as nursing, discussions with consultants, evaluation of patient's response to treatment, examination of patient, obtaining history from patient or surrogate, ordering and performing treatments and interventions, ordering and review of laboratory studies, ordering and review of radiographic studies, pulse oximetry, re-evaluation of patient's condition and participation in multidisciplinary rounds.   During this encounter critical care time was devoted to patient care services described in this note for 35 minutes.     Valinda Novas, MD [facility] See Amion for pager If no response to pager, please call [phone] until 7pm After 7pm, Please call E-link [phone]   "

## 2024-09-08 NOTE — Progress Notes (Signed)
 "   Advanced Heart Failure Rounding Note  [facility] Cardiologist: Dr. Zenaida Chief Complaint: Shock Subjective:    12/22: DCCV for AF RVR. Post-TEE developed flash pulmonary edema with hypotension.   Remains in AF  On DBA 2.5  Co-ox 67% CVP 15 On IV lasix    Feels better this am   Objective:    Vital Signs:   Temp:  [97.6 F (36.4 C)-98.6 F (37 C)] 98.2 F (36.8 C) (12/25 1954) Pulse Rate:  [88-126] 114 (12/25 2120) Resp:  [15-50] 21 (12/25 2120) BP: (59-134)/(48-94) 129/66 (12/25 2120) SpO2:  [88 %-100 %] 99 % (12/25 2120) FiO2 (%):  [28 %] 28 % (12/25 2120) Weight:  [76.1 kg] 76.1 kg (12/25 0500) Last BM Date : 09/02/24  Weight change: Filed Weights   09/06/24 0500 09/07/24 0500 09/08/24 0500  Weight: 79.3 kg 79 kg 76.1 kg   Intake/Output:  Intake/Output Summary (Last 24 hours) at 09/08/2024 2125 Last data filed at 09/08/2024 1900 Gross per 24 hour  Intake 1542.03 ml  Output 3385 ml  Net -1842.97 ml    Physical Exam: General:  Sitting up in chair No resp difficulty HEENT: normal Neck: supple. JVP to ear Cor: Irreg tachy  Lungs: clear Abdomen: soft, nontender, nondistended.Good bowel sounds. Extremities: no cyanosis, clubbing, rash, 2+ edema Neuro: alert & orientedx3, cranial nerves grossly intact. moves all 4 extremities w/o difficulty. Affect pleasant   Telemetry: AF 110-120s (personally reviewed)  Labs: Basic Metabolic Panel: Recent Labs  Lab 09/03/24 0437 09/04/24 0211 09/05/24 0547 09/06/24 0441 09/06/24 1415 09/07/24 0414 09/08/24 0414 09/08/24 1633  NA 138 138   < > 135 132* 131* 133* 138  K 3.3* 3.4*   < > 5.4* 4.7 4.0 3.2* 4.0  CL 95* 94*   < > 96* 93* 91* 91* 94*  CO2 32 35*   < > 31 32 32 34* 34*  GLUCOSE 146* 105*   < > 103* 132* 214* 203* 136*  BUN 37* 37*   < > 39* 42* 40* 36* 34*  CREATININE 1.82* 1.93*   < > 2.03* 2.06* 1.96* 2.06* 1.96*  CALCIUM  8.8* 8.7*   < > 9.1 8.9 8.7* 8.4* 8.6*  MG 2.0 2.0  --  2.2  --  2.2  --   --    PHOS  --  3.9  --   --   --   --   --   --    < > = values in this interval not displayed.   Liver Function Tests: Recent Labs  Lab 09/05/24 2120 09/06/24 1415 09/07/24 0414 09/08/24 0414  AST 194* 431* 252* 130*  ALT 81* 238* 209* 158*  ALKPHOS 288* 209* 186* 174*  BILITOT 1.5* 0.6 0.5 0.3  PROT 7.0 5.9* 5.7* 5.6*  ALBUMIN 3.8 3.2* 3.2* 3.1*   CBC: Recent Labs  Lab 09/02/24 0828 09/04/24 0211 09/05/24 2120 09/06/24 0441 09/07/24 0414  WBC 4.9 4.7 7.9 5.3 5.6  HGB 9.5* 9.2* 11.3* 9.1* 8.7*  HCT 29.0* 28.2* 35.5* 28.3* 27.4*  MCV 109.8* 108.5* 110.6* 111.0* 112.8*  PLT 201 186 249 161 156   BNP (last 3 results) Recent Labs    07/27/24 0930 08/29/24 1719  BNP 1,340.4* 2,397.9*   ProBNP (last 3 results) Recent Labs    09/05/24 2120  PROBNP 19,057.0*   Medications:    Scheduled Medications:  apixaban   5 mg Oral BID   arformoterol   15 mcg Nebulization BID   bethanechol   10 mg Oral  TID   Chlorhexidine  Gluconate Cloth  6 each Topical Daily   multivitamin with minerals  1 tablet Oral Daily   mupirocin  ointment  1 Application Nasal BID   pravastatin   20 mg Oral q1800   revefenacin   175 mcg Nebulization Daily   sodium chloride  flush  10-40 mL Intracatheter Q12H    Infusions:  amiodarone  60 mg/hr (09/08/24 1900)   cefTRIAXone  (ROCEPHIN )  IV Stopped (09/08/24 0950)   furosemide  (LASIX ) 200 mg in dextrose  5 % 100 mL (2 mg/mL) infusion 10 mg/hr (09/08/24 1900)   milrinone  0.125 mcg/kg/min (09/08/24 1900)   norepinephrine  (LEVOPHED ) Adult infusion 4 mcg/min (09/08/24 1900)    PRN Medications: acetaminophen  **OR** [DISCONTINUED] acetaminophen , bisacodyl , diazepam , menthol , ondansetron  **OR** ondansetron  (ZOFRAN ) IV, phenol, senna-docusate, sodium chloride  flush, traMADol   Assessment/Progress:   # Persistent A fib RVR - Last in SR 12/2023. Has had Persistent  A Fib  RVR over the last 3 months with minimal symptom until now. Previously on amio but stopped due to  COPD. Tikosyn not an option with CKD.  - s/p TEE DC-CV 09/05/24 complicated but post-DC-CV shock - No beta blocker d/t shock - back in AF since 12/24. Rates 110-120s - continue IV amio - continue eliquis  5mg  bid - repeat DC-CV when volume improved - IF EF improves with SR will need eventual evaluation for ablation. If unable to maintain NSR in hospital may need AVN ablation and CRT-D   A/C Biventricular systolic HF HF>>cardiogenic shock - In 2024 EF improved 50-55% but most recent Echos with severely reduced biventricular HF. She had a cath with patent graphs.  - Suspect tachy mediated.  - TEE EF 20% severe RV HK. Severe TR mod MR - Decompensated overnight into 12/23. Lactic acid 5.4>3.8> 0.8 now clear - With AF will switch DBA to milrinone  and use NE for BP back-up  - CVP 15. Continue diuresis - Plan for AF asbove   AKI on CKD Stage IIIb - Creatinine baseline unclear baseline? ~ 1. 7-1.9 - Scr stabilized now on inotropes   Chronic Respiratory Failure, Oxygen Dependent, COPD - On 2L [place] - Sats stable.    CAD - H/O CABG 2021. Cath 2024 3 of 3 patent grafts LIMA-LAD, SVG-PDA, SVG-OM 3 - no s/s angina - On statin - No aspirin  with need for anticoagulation   Anemia  - H/O GI Bleed.  - 11/25 Colonsocopy with bleeding ulcer treated with hemoclip - Hgb stable   Hyperkalemia/Hypokalemia - IK low. Supp   [place] liver - improving with support  CRITICAL CARE Performed by: Toribio Fuel  Total critical care time: 33 minutes  -Critical care time was exclusive of separately billable procedures and treating other patients. -Critical care was necessary to treat or prevent imminent or life-threatening deterioration. -Critical care was time spent personally by me on the following activities: development of treatment plan with patient and/or surrogate as well as nursing, discussions with consultants, evaluation of patient's response to treatment, examination of patient, obtaining  history from patient or surrogate, ordering and performing treatments and interventions, ordering and review of laboratory studies, ordering and review of radiographic studies, pulse oximetry and re-evaluation of patient's condition.   Length of Stay: 10  Toribio Fuel 09/08/2024, 9:25 PM  Advanced Heart Failure Team Pager [phone] (M-F; 7a - 4p)  Please contact [facility] for night-coverage after hours (4p -7a ) and weekends on [email or web address]       "

## 2024-09-08 NOTE — Progress Notes (Signed)
 eLink Physician-Brief Progress Note Patient Name: Monica Zamora DOB: 06-Sep-1951 MRN: [id number]   Date of Service  09/08/2024  HPI/Events of Note  Notified of hypokalemia with K at 3.2, crea 2.06.   eICU Interventions  Replete with oral potassium -40meq PO potassium x 1 ordered.      Intervention Category Minor Interventions: Electrolytes abnormality - evaluation and management  Monica Zamora 09/08/2024, 5:24 AM

## 2024-09-08 NOTE — Plan of Care (Signed)

## 2024-09-08 NOTE — Plan of Care (Signed)
  Problem: Education: Goal: Knowledge of General Education information will improve Description: Including pain rating scale, medication(s)/side effects and non-pharmacologic comfort measures Outcome: Progressing   Problem: Health Behavior/Discharge Planning: Goal: Ability to manage health-related needs will improve Outcome: Progressing   Problem: Clinical Measurements: Goal: Ability to maintain clinical measurements within normal limits will improve Outcome: Progressing Goal: Will remain free from infection Outcome: Progressing Goal: Diagnostic test results will improve Outcome: Progressing Goal: Respiratory complications will improve Outcome: Progressing Goal: Cardiovascular complication will be avoided Outcome: Progressing   Problem: Activity: Goal: Risk for activity intolerance will decrease Outcome: Progressing   Problem: Nutrition: Goal: Adequate nutrition will be maintained Outcome: Progressing   Problem: Coping: Goal: Level of anxiety will decrease Outcome: Progressing   Problem: Elimination: Goal: Will not experience complications related to bowel motility Outcome: Progressing Goal: Will not experience complications related to urinary retention Outcome: Progressing   Problem: Pain Managment: Goal: General experience of comfort will improve and/or be controlled Outcome: Progressing   Problem: Safety: Goal: Ability to remain free from injury will improve Outcome: Progressing   Problem: Skin Integrity: Goal: Risk for impaired skin integrity will decrease Outcome: Progressing   Problem: Education: Goal: Ability to demonstrate management of disease process will improve Outcome: Progressing Goal: Ability to verbalize understanding of medication therapies will improve Outcome: Progressing Goal: Individualized Educational Video(s) Outcome: Progressing   Problem: Activity: Goal: Capacity to carry out activities will improve Outcome: Progressing    Problem: Cardiac: Goal: Ability to achieve and maintain adequate cardiopulmonary perfusion will improve Outcome: Progressing   Problem: Education: Goal: Knowledge of disease or condition will improve Outcome: Progressing Goal: Understanding of medication regimen will improve Outcome: Progressing Goal: Individualized Educational Video(s) Outcome: Progressing   Problem: Activity: Goal: Ability to tolerate increased activity will improve Outcome: Progressing   Problem: Cardiac: Goal: Ability to achieve and maintain adequate cardiopulmonary perfusion will improve Outcome: Progressing   Problem: Health Behavior/Discharge Planning: Goal: Ability to safely manage health-related needs after discharge will improve Outcome: Progressing

## 2024-09-09 DIAGNOSIS — J9621 Acute and chronic respiratory failure with hypoxia: Secondary | ICD-10-CM

## 2024-09-09 DIAGNOSIS — E876 Hypokalemia: Secondary | ICD-10-CM

## 2024-09-09 DIAGNOSIS — I4819 Other persistent atrial fibrillation: Secondary | ICD-10-CM | POA: Diagnosis not present

## 2024-09-09 DIAGNOSIS — J9601 Acute respiratory failure with hypoxia: Secondary | ICD-10-CM

## 2024-09-09 DIAGNOSIS — I5082 Biventricular heart failure: Secondary | ICD-10-CM | POA: Diagnosis not present

## 2024-09-09 DIAGNOSIS — I5023 Acute on chronic systolic (congestive) heart failure: Secondary | ICD-10-CM | POA: Diagnosis not present

## 2024-09-09 DIAGNOSIS — J810 Acute pulmonary edema: Secondary | ICD-10-CM

## 2024-09-09 DIAGNOSIS — K7200 Acute and subacute hepatic failure without coma: Secondary | ICD-10-CM

## 2024-09-09 DIAGNOSIS — R570 Cardiogenic shock: Secondary | ICD-10-CM | POA: Diagnosis not present

## 2024-09-09 DIAGNOSIS — R739 Hyperglycemia, unspecified: Secondary | ICD-10-CM

## 2024-09-09 LAB — CBC
HCT: 27 % — ABNORMAL LOW (ref 36.0–46.0)
Hemoglobin: 8.3 g/dL — ABNORMAL LOW (ref 12.0–15.0)
MCH: 34.4 pg — ABNORMAL HIGH (ref 26.0–34.0)
MCHC: 30.7 g/dL (ref 30.0–36.0)
MCV: 112 fL — ABNORMAL HIGH (ref 80.0–100.0)
Platelets: 174 K/uL (ref 150–400)
RBC: 2.41 MIL/uL — ABNORMAL LOW (ref 3.87–5.11)
RDW: 14.9 % (ref 11.5–15.5)
WBC: 5 K/uL (ref 4.0–10.5)
nRBC: 0 % (ref 0.0–0.2)

## 2024-09-09 LAB — GLUCOSE, CAPILLARY
Glucose-Capillary: 101 mg/dL — ABNORMAL HIGH (ref 70–99)
Glucose-Capillary: 129 mg/dL — ABNORMAL HIGH (ref 70–99)
Glucose-Capillary: 130 mg/dL — ABNORMAL HIGH (ref 70–99)
Glucose-Capillary: 116 mg/dL — ABNORMAL HIGH (ref 70–99)

## 2024-09-09 LAB — COOXEMETRY PANEL
Carboxyhemoglobin: 1.9 % — ABNORMAL HIGH (ref 0.5–1.5)
Carboxyhemoglobin: 2.3 % — ABNORMAL HIGH (ref 0.5–1.5)
Methemoglobin: 1.2 % (ref 0.0–1.5)
Methemoglobin: 0.7 % (ref 0.0–1.5)
O2 Saturation: 77.2 %
O2 Saturation: 79 %
Total hemoglobin: 8.6 g/dL — ABNORMAL LOW (ref 12.0–16.0)
Total hemoglobin: 8.8 g/dL — ABNORMAL LOW (ref 12.0–16.0)

## 2024-09-09 LAB — BASIC METABOLIC PANEL WITH GFR
Anion gap: 9 (ref 5–15)
Anion gap: 11 (ref 5–15)
BUN: 33 mg/dL — ABNORMAL HIGH (ref 8–23)
BUN: 29 mg/dL — ABNORMAL HIGH (ref 8–23)
CO2: 35 mmol/L — ABNORMAL HIGH (ref 22–32)
CO2: 33 mmol/L — ABNORMAL HIGH (ref 22–32)
Calcium: 8.4 mg/dL — ABNORMAL LOW (ref 8.9–10.3)
Calcium: 8.4 mg/dL — ABNORMAL LOW (ref 8.9–10.3)
Chloride: 92 mmol/L — ABNORMAL LOW (ref 98–111)
Chloride: 93 mmol/L — ABNORMAL LOW (ref 98–111)
Creatinine, Ser: 1.84 mg/dL — ABNORMAL HIGH (ref 0.44–1.00)
Creatinine, Ser: 2.08 mg/dL — ABNORMAL HIGH (ref 0.44–1.00)
GFR, Estimated: 29 mL/min — ABNORMAL LOW
GFR, Estimated: 25 mL/min — ABNORMAL LOW
Glucose, Bld: 142 mg/dL — ABNORMAL HIGH (ref 70–99)
Glucose, Bld: 145 mg/dL — ABNORMAL HIGH (ref 70–99)
Potassium: 3.3 mmol/L — ABNORMAL LOW (ref 3.5–5.1)
Potassium: 4.6 mmol/L (ref 3.5–5.1)
Sodium: 137 mmol/L (ref 135–145)
Sodium: 136 mmol/L (ref 135–145)

## 2024-09-09 LAB — MAGNESIUM: Magnesium: 2.1 mg/dL (ref 1.7–2.4)

## 2024-09-09 LAB — POTASSIUM: Potassium: 3.4 mmol/L — ABNORMAL LOW (ref 3.5–5.1)

## 2024-09-09 MED ORDER — POTASSIUM CHLORIDE 20 MEQ PO PACK
40.0000 meq | PACK | Freq: Once | ORAL | Status: AC
  Administered 2024-09-09: 40 meq via ORAL
  Filled 2024-09-09: qty 2

## 2024-09-09 MED ORDER — UMECLIDINIUM-VILANTEROL 62.5-25 MCG/ACT IN AEPB
1.0000 | INHALATION_SPRAY | Freq: Every day | RESPIRATORY_TRACT | Status: DC
  Administered 2024-09-11 – 2024-09-12 (×2): 1 via RESPIRATORY_TRACT
  Filled 2024-09-09: qty 14

## 2024-09-09 MED ORDER — POTASSIUM CHLORIDE 20 MEQ PO PACK
40.0000 meq | PACK | ORAL | Status: AC
  Administered 2024-09-09 (×2): 40 meq via ORAL
  Filled 2024-09-09 (×2): qty 2

## 2024-09-09 NOTE — Progress Notes (Signed)
 eLink Physician-Brief Progress Note Patient Name: Monica Zamora DOB: 22-Dec-1950 MRN: [id number]   Date of Service  09/09/2024  HPI/Events of Note  Rn requesting Electrolyte Replacement for K+ 3.3. Cr. 1.84, GFR 29. Can take orals and has PICC line.   eICU Interventions  Replacement ordered     Intervention Category Intermediate Interventions: Electrolyte abnormality - evaluation and management  Monica Zamora 09/09/2024, 5:16 AM

## 2024-09-09 NOTE — TOC Progression Note (Signed)
 Transition of Care [facility]) - Progression Note    Patient Details  Name: Monica Zamora MRN: [id number] Date of Birth: 1951-05-11  Transition of Care [facility]) CM/SW Contact  Arlana JINNY Nicholaus [place] Phone Number: [phone] 09/09/2024, 10:33 AM  Clinical Narrative:   Chart reviewed for discharge readiness, patient not medically stable for d/c.   HF CSW/CM will continue following and monitor for dc readiness; while HF team is following.     Expected Discharge Plan: Home w Home Health Services Barriers to Discharge: Continued Medical Work up               Expected Discharge Plan and Services   Discharge Planning Services: CM Consult Post Acute Care Choice: Home Health Living arrangements for the past 2 months: Single Family Home                   DME Agency: NA       HH Arranged: NA           Social Drivers of Health (SDOH) Interventions SDOH Screenings   Food Insecurity: No Food Insecurity (08/30/2024)  Housing: Low Risk (08/30/2024)  Transportation Needs: No Transportation Needs (08/30/2024)  Utilities: Not At Risk (08/30/2024)  Alcohol Screen: Low Risk (07/29/2024)  Financial Resource Strain: Low Risk (07/29/2024)  Physical Activity: Unknown (12/10/2023)   Received from [facility]  Social Connections: Socially Isolated (08/30/2024)  Stress: No Stress Concern Present (12/10/2023)   Received from [facility]  Recent Concern: Stress - Stress Concern Present (11/06/2023)   Received from [facility]  Tobacco Use: Medium Risk (08/31/2024)    Readmission Risk Interventions     No data to display

## 2024-09-09 NOTE — Progress Notes (Signed)
 "  NAME:  Monica Zamora, MRN:  [id number], DOB:  February 23, 1951, LOS: 11 ADMISSION DATE:  08/29/2024 CONSULTATION DATE: 09/05/2024 REFERRING MD:  Franky - TRH, CHIEF COMPLAINT:  SOB, hypotension   History of Present Illness:  73 year old woman who presented to [facility] 12/15 for LE edema/volume overload and SOB. PMHx significant for HTN, HLD, CAD s/p CABG (2021), HFrEF (Echo 07/2024 EF 20-25%), Afib (on Eliquis ), COPD with chronic hypoxic respiratory failure (on 2L HOT), former tobacco use, PUD/GIB (07/2024), CKD stage 3a. Recent admission 11/12-11/18 for Afib/RVR and GIB.  Patient was seen in follow up by Cardiology 12/15 and advised to present to ED for further evaluation and management of persistent AF/RVR, volume overload and urinary retention. Seen by Cardiology on ED arrival with recommendation for increased BB dosage and ongoing diuresis as tolerated. Admitted by TRH. Underwent close monitoring, attempted rate control for AF/RVR management and diuresis for management of volume overload. Developed AKI on CKD (in the setting of aggressive diuresis; this was adjusted.) GDMT limited due to kidney disease, frequent UTIs and COPD. AHF team consulted with plan for TEE/DCCV. Underwent TEE/DCCV 12/22 ([place]) with conversion to NSR.   Post-TEE, patient reported lightheadedness and SOB; she became tachypneic with RR 28-32. CXR demonstrated diffuse interstitial coarsening favoring pulmonary edema, small bilateral pleural effusions. On 12/22PM, patient was noted to be hypotensive with SBP 80s and concern for cardiogenic shock. Transferred to 2H.  PCCM was consulted for further evaluation and management.  Pertinent Medical History:   Past Medical History:  Diagnosis Date   A-fib (HCC)    Carotid artery disease    COPD (chronic obstructive pulmonary disease) (HCC)    Coronary artery disease    Former tobacco use    Heart failure with mildly reduced ejection fraction (HFmrEF) (HCC)    Hx of CABG     Hypertension    PUD (peptic ulcer disease)    Significant Hospital Events: Including procedures, antibiotic start and stop dates in addition to other pertinent events   12/15 - Admitted to [facility] via Cardiology clinic. Cardiology formally consulted. Admitted by [facility]. 12/19 - AHF consulted. Plan for TEE/DCCV. 12/22 - Underwent TEE/DCCV with conversion to NSR. In PM, hypotensive prompting call to PCCM. Transferred to [facility] 12/23 remain on dobutamine  and Levophed , not making much urine was given Lasix  and metolazone  in the evening.  Remain on 2 to 3 L nasal cannula oxygen.  In the evening she choked after drinking water, she became hypoxic into 60s, requiring nonrebreather facemask 12/24 back on 2 L nasal cannula oxygen, baseline, remained on dobutamine  at 5 and Levophed  at 8.  Went back into A-fib with RVR heart rate ranging 120s, started on amiodarone  12/26 remains in A-fib with RVR, -10 L since admission with excellent clinical response.  Remains on amiodarone  as well as milrinone  and Lasix  drip.  No longer pressor dependent  Interim History / Subjective:  Continues to feel better.  CVP down to 9 from 15 Urine output Remains in A-fib Norepinephrine  is off, currently on milrinone  at 0.125, furosemide  drip at 10 mg an Objective:  Blood pressure 115/65, pulse (!) 109, temperature (!) 97.3 F (36.3 C), temperature source Oral, resp. rate (!) 25, height 5' 6 (1.676 m), weight 73.1 kg, SpO2 100%. CVP:  [7 mmHg-17 mmHg] 10 mmHg  FiO2 (%):  [28 %] 28 %   Intake/Output Summary (Last 24 hours) at 09/09/2024 1052 Last data filed at 09/09/2024 0900 Gross per 24 hour  Intake 1667.46 ml  Output 4375  ml  Net -2707.54 ml   Filed Weights   09/07/24 0500 09/08/24 0500 09/09/24 0500  Weight: 79 kg 76.1 kg 73.1 kg   Physical Examination: General pleasant 73 year old female patient currently resting and in no acute distress on 2 L/min supplemental oxygen HEENT normocephalic atraumatic Pulmonary no  accessory use, crackles bases. Portable chest x-ray from the 23rd shows bibasilar airspace disease and vascular congestion Cardiac atrial fibrillation currently, with heart rate in the 100-1 teens range Abdomen soft not tender Extremities warm dry continues to have dependent edema pulses are palpable Neuro awake and oriented Resolved Hospital Problem List:  Lactic acidosis Hyperkalemia Symptomatic bradycardia, likely medication induced  Assessment & Plan:  Acute on chronic biventricular HFrEF with cardiogenic shock  Coronary artery disease She is off norepinephrine  Co. oximetry is 79% Plan Continuing milrinone  and furosemide  infusion per heart failure, to maximize volume removal and ensure adequate cardiac output Continue telemetry monitoring Norepinephrine  on standby, ensuring mean arterial pressure greater than 65 Anticipate cardioversion on 12/27 with hopes that better rate control may continue to improve cardiac function Continue statin  Paroxysmal A-fib with RVR status post TEE/DCCV on 12/22, back in A-fib with RVR Remains on amiodarone  infusion Plan Continue amiodarone  Continue apixaban  for anticoagulation Plan for reattempt at cardioversion on 12/27 will make n.p.o. after midnight  Chronic hypoxic respiratory failure on home oxygen on 2 L/min Aspiration pneumonitis COPD, not in exacerbation Respiratory status continues to slowly improve Plan Continuing supplemental oxygen Continuing pulse oximetry Continue triple combo nebulized therapy with plan to Resume her regular Anoro ellipta  at dc Antibiotics changed to ceftriaxone , will complete 5 total days of therapy  CKD stage IIIb Serum creatinine continues to improve Plan Continuing IV diuresis Ensure mean arterial pressure greater than 65 Strict intake output Renal dose medications Serial chemistries  Hypokalemia Plan Replace and recheck  PUD/GI bleeding, stable.  Hemoglobin 8.3 down from 8.7 Plan Continuing  PPI Trend CBC  Shock liver, also complicated by hepatic congestion.  LFTs have been improving Plan Repeat complete metabolic panel in the morning  Acute urinary retention, on bethanechol  Plan Continue bethanechol  Will keep Foley given she is on a Lasix  drip Hold off on voiding trial for now   I personally  spent 32 minutes  on this patient which included: review of medical records, nursing notes, progress notes, evaluation, interpretation of lab data and diagnostic studies, taking independent history, performing exam, documenting plan, ordering diagnostics and interventions for the following critical care issues: Circulatory shock, Acute respiratory failure with the following interventions which included: prevention of further deterioration     "

## 2024-09-09 NOTE — Progress Notes (Signed)
 "   Advanced Heart Failure Rounding Note  AH[facility] Cardiologist: Dr. Zenaida Chief Complaint: Shock Subjective:    12/22: DCCV for AF RVR. Post-TEE developed flash pulmonary edema with hypotension.   Remains in AF with RVR  Now on milrinone . Co-ox 79  Diuresing well. Weight down 6 pounds. CVP 9-10 (down from 15)  Denies CP or SOB    SCr improving  Objective:    Vital Signs:   Temp:  [97.3 F (36.3 C)-98.6 F (37 C)] 97.3 F (36.3 C) (12/26 0800) Pulse Rate:  [89-121] 109 (12/26 0900) Resp:  [18-53] 25 (12/26 0900) BP: (83-134)/(46-105) 115/65 (12/26 0900) SpO2:  [89 %-100 %] 100 % (12/26 0900) FiO2 (%):  [28 %] 28 % (12/25 2120) Weight:  [73.1 kg] 73.1 kg (12/26 0500) Last BM Date : 09/02/24  Weight change: Filed Weights   09/07/24 0500 09/08/24 0500 09/09/24 0500  Weight: 79 kg 76.1 kg 73.1 kg   Intake/Output:  Intake/Output Summary (Last 24 hours) at 09/09/2024 0937 Last data filed at 09/09/2024 0900 Gross per 24 hour  Intake 1667.46 ml  Output 4650 ml  Net -2982.54 ml    Physical Exam: General:  Sitting up in bed. No resp difficulty HEENT: normal Neck: supple. JVP 9 Cor: Irreg tachy  No rubs, gallops or murmurs. Lungs: clear Abdomen: soft, nontender, nondistended.Good bowel sounds. Extremities: no cyanosis, clubbing, rash, tr edema Neuro: alert & orientedx3, cranial nerves grossly intact. moves all 4 extremities w/o difficulty. Affect pleasant    Telemetry: AF 110-130  (personally reviewed)  Labs: Basic Metabolic Panel: Recent Labs  Lab 09/03/24 0437 09/04/24 0211 09/05/24 0547 09/06/24 0441 09/06/24 1415 09/07/24 0414 09/08/24 0414 09/08/24 1633 09/09/24 0413  NA 138 138   < > 135 132* 131* 133* 138 137  K 3.3* 3.4*   < > 5.4* 4.7 4.0 3.2* 4.0 3.3*  CL 95* 94*   < > 96* 93* 91* 91* 94* 92*  CO2 32 35*   < > 31 32 32 34* 34* 35*  GLUCOSE 146* 105*   < > 103* 132* 214* 203* 136* 142*  BUN 37* 37*   < > 39* 42* 40* 36* 34* 33*  CREATININE  1.82* 1.93*   < > 2.03* 2.06* 1.96* 2.06* 1.96* 1.84*  CALCIUM  8.8* 8.7*   < > 9.1 8.9 8.7* 8.4* 8.6* 8.4*  MG 2.0 2.0  --  2.2  --  2.2  --   --  2.1  PHOS  --  3.9  --   --   --   --   --   --   --    < > = values in this interval not displayed.   Liver Function Tests: Recent Labs  Lab 09/05/24 2120 09/06/24 1415 09/07/24 0414 09/08/24 0414  AST 194* 431* 252* 130*  ALT 81* 238* 209* 158*  ALKPHOS 288* 209* 186* 174*  BILITOT 1.5* 0.6 0.5 0.3  PROT 7.0 5.9* 5.7* 5.6*  ALBUMIN 3.8 3.2* 3.2* 3.1*   CBC: Recent Labs  Lab 09/04/24 0211 09/05/24 2120 09/06/24 0441 09/07/24 0414 09/09/24 0413  WBC 4.7 7.9 5.3 5.6 5.0  HGB 9.2* 11.3* 9.1* 8.7* 8.3*  HCT 28.2* 35.5* 28.3* 27.4* 27.0*  MCV 108.5* 110.6* 111.0* 112.8* 112.0*  PLT 186 249 161 156 174   BNP (last 3 results) Recent Labs    07/27/24 0930 08/29/24 1719  BNP 1,340.4* 2,397.9*   ProBNP (last 3 results) Recent Labs    09/05/24 2120  PROBNP  19,057.0*   Medications:    Scheduled Medications:  apixaban   5 mg Oral BID   arformoterol   15 mcg Nebulization BID   bethanechol   10 mg Oral TID   Chlorhexidine  Gluconate Cloth  6 each Topical Daily   multivitamin with minerals  1 tablet Oral Daily   mupirocin  ointment  1 Application Nasal BID   pravastatin   20 mg Oral q1800   revefenacin   175 mcg Nebulization Daily   sodium chloride  flush  10-40 mL Intracatheter Q12H    Infusions:  amiodarone  60 mg/hr (09/09/24 0900)   cefTRIAXone  (ROCEPHIN )  IV Stopped (09/08/24 0950)   furosemide  (LASIX ) 200 mg in dextrose  5 % 100 mL (2 mg/mL) infusion 10 mg/hr (09/09/24 0900)   milrinone  0.125 mcg/kg/min (09/09/24 0900)   norepinephrine  (LEVOPHED ) Adult infusion Stopped (09/08/24 2332)    PRN Medications: acetaminophen  **OR** [DISCONTINUED] acetaminophen , bisacodyl , diazepam , menthol , ondansetron  **OR** ondansetron  (ZOFRAN ) IV, phenol, senna-docusate, sodium chloride  flush, traMADol   Assessment/Progress:   # Persistent  A fib RVR - Last in SR 12/2023. Has had Persistent  A Fib  RVR over the last 3 months with minimal symptom until now. Previously on amio but stopped due to COPD. Tikosyn not an option with CKD.  - s/p TEE DC-CV 09/05/24 complicated but post-DC-CV shock - No beta blocker d/t shock - back in AF since 12/24. Rates 110-120s - continue IV amio - continue eliquis  5mg  bid - plan repeat bedside TEE tomorrow am  - IF EF improves with SR will need eventual evaluation for ablation. If unable to maintain NSR in hospital may need AVN ablation and CRT-D   A/C Biventricular systolic HF HF>>cardiogenic shock - In 2024 EF improved 50-55% but most recent Echos with severely reduced biventricular HF. She had a cath with patent graphs.  - Suspect tachy mediated.  - TEE EF 20% severe RV HK. Severe TR mod MR - Decompensated overnight into 12/23. Lactic acid 5.4>3.8> 0.8 now clear - Co-ox 79% on milrinone  0.125. NE off this am  - Continue milrinone  and IV diuresis    AKI on CKD Stage IIIb - Creatinine baseline unclear baseline? ~ 1. 7-1.9 - Scr stabilized now on inotropes - Scr 1.7 today   Chronic Respiratory Failure, Oxygen Dependent, COPD - On 2L [place] - Sats stable.    CAD - H/O CABG 2021. Cath 2024 3 of 3 patent grafts LIMA-LAD, SVG-PDA, SVG-OM 3 - no s/s angina - On statin - No aspirin  with need for anticoagulation   Anemia  - H/O GI Bleed.  - 11/25 Colonsocopy with bleeding ulcer treated with hemoclip - Hgb stable   Hyperkalemia/Hypokalemia - supp k today  [place] liver - improving with support  CRITICAL CARE Performed by: Toribio Fuel  Total critical care time: 35 minutes  -Critical care time was exclusive of separately billable procedures and treating other patients. -Critical care was necessary to treat or prevent imminent or life-threatening deterioration. -Critical care was time spent personally by me on the following activities: development of treatment plan with patient and/or  surrogate as well as nursing, discussions with consultants, evaluation of patient's response to treatment, examination of patient, obtaining history from patient or surrogate, ordering and performing treatments and interventions, ordering and review of laboratory studies, ordering and review of radiographic studies, pulse oximetry and re-evaluation of patient's condition.   Length of Stay: 16  Toribio Fuel 09/09/2024, 9:37 AM  Advanced Heart Failure Team Pager [phone] (M-F; 7a - 4p)  Please contact [facility] for night-coverage after  hours (4p -7a ) and weekends on [email or web address]       "

## 2024-09-09 NOTE — Plan of Care (Signed)
" °  Problem: Education: Goal: Knowledge of General Education information will improve Description: Including pain rating scale, medication(s)/side effects and non-pharmacologic comfort measures Outcome: Progressing   Problem: Health Behavior/Discharge Planning: Goal: Ability to manage health-related needs will improve Outcome: Progressing   Problem: Clinical Measurements: Goal: Ability to maintain clinical measurements within normal limits will improve Outcome: Progressing Goal: Will remain free from infection Outcome: Progressing Goal: Diagnostic test results will improve Outcome: Progressing Goal: Respiratory complications will improve Outcome: Progressing Goal: Cardiovascular complication will be avoided Outcome: Progressing   Problem: Activity: Goal: Risk for activity intolerance will decrease Outcome: Progressing   Problem: Nutrition: Goal: Adequate nutrition will be maintained Outcome: Progressing   Problem: Coping: Goal: Level of anxiety will decrease Outcome: Progressing   Problem: Elimination: Goal: Will not experience complications related to urinary retention Outcome: Progressing   Problem: Pain Managment: Goal: General experience of comfort will improve and/or be controlled Outcome: Progressing   Problem: Safety: Goal: Ability to remain free from injury will improve Outcome: Progressing   Problem: Skin Integrity: Goal: Risk for impaired skin integrity will decrease Outcome: Progressing   Problem: Education: Goal: Ability to demonstrate management of disease process will improve Outcome: Progressing Goal: Ability to verbalize understanding of medication therapies will improve Outcome: Progressing   Problem: Activity: Goal: Capacity to carry out activities will improve Outcome: Progressing   Problem: Cardiac: Goal: Ability to achieve and maintain adequate cardiopulmonary perfusion will improve Outcome: Progressing   Problem: Education: Goal:  Knowledge of disease or condition will improve Outcome: Progressing Goal: Understanding of medication regimen will improve Outcome: Progressing   Problem: Activity: Goal: Ability to tolerate increased activity will improve Outcome: Progressing   Problem: Cardiac: Goal: Ability to achieve and maintain adequate cardiopulmonary perfusion will improve Outcome: Progressing   Problem: Health Behavior/Discharge Planning: Goal: Ability to safely manage health-related needs after discharge will improve Outcome: Progressing   "

## 2024-09-09 NOTE — Progress Notes (Deleted)
 Physical Therapy Treatment Patient Details Name: Monica Zamora MRN: [id number] DOB: 1950-11-26 Today's Date: 09/09/2024   History of Present Illness 73 y.o. female presents to [facility] 08/29/24 from OP cardiologist for CHF exacerbation and a-fib w/ RVR. Chest radiograph showed B hilar vascular congestion and small B pleural effusions. S/p  DCCV for AF RVR on 12/22, post-TEE developed flash pulmonary edema with hypotension, transferred to ICU. Converted back to afib RVR 12/23. PMHx: Afib, CAD s/p CABG, COPD, HFmrEF, HTN, PVD, HLD, CKD 3A,    PT Comments  When PT asked RN if pt was appropriate to see, RN requested assistance to get pt back to bed for transfer to CT. Pt with no verbal response to her name but R eye does meet PT gaze for a moment when PT calling her name. Pt with no command follow for hand squeeze Pt has no blink response to threat in L eye. Pt lifted back to bed with maxiSky. Pt requires pericare for incontinence of stool. PT provided total A for rolling R and L for removal of lift pad and pericare. Pt headed for CT scan. PT will follow back for reassessment in next session.     If plan is discharge home, recommend the following: A lot of help with walking and/or transfers;A lot of help with bathing/dressing/bathroom;Assist for transportation;Help with stairs or ramp for entrance;Assistance with cooking/housework;Direct supervision/assist for medications management   Can travel by private vehicle      No  Equipment Recommendations  None recommended by PT       Precautions / Restrictions Precautions Precautions: Fall Recall of Precautions/Restrictions: Intact Precaution/Restrictions Comments: watch HR, in afib, and on 6L O2 Restrictions Weight Bearing Restrictions Per Provider Order: No     Mobility  Bed Mobility Overal bed mobility: Needs Assistance Bed Mobility: Rolling Rolling: Total assist, +2 for physical assistance         General bed mobility comments: pt is  total Ax2 for rolling R and L for removal of lift pad after return to bed    Transfers Overall transfer level: Needs assistance                 General transfer comment: pt with no command follow, unable to assist in transfer from chair back to bed Transfer via Lift Equipment: Maxisky  Ambulation/Gait               General Gait Details: deferred due to HR and pt fatigue       Balance Overall balance assessment: Needs assistance, History of Falls   Sitting balance-Leahy Scale: Zero Sitting balance - Comments: supported by recliner                                    Communication Communication Communication: Impaired Factors Affecting Communication: Hearing impaired  Cognition Arousal: Lethargic Behavior During Therapy: Flat affect   PT - Cognitive impairments: No apparent impairments, Difficult to assess Difficult to assess due to: Level of arousal                     PT - Cognition Comments: pt with eyes open but no response to commands Following commands: Intact      Cueing Cueing Techniques: Verbal cues, Gestural cues, Tactile cues     General Comments General comments (skin integrity, edema, etc.): HR in 50s on 2L O2 via [place]      Pertinent  Vitals/Pain Pain Assessment Pain Assessment: Faces Pain Score: 0-No pain     PT Goals (current goals can now be found in the care plan section) Acute Rehab PT Goals Patient Stated Goal: to go home Progress towards PT goals: Not progressing toward goals - comment    Frequency    Min 1X/week       AM-PAC PT 6 Clicks Mobility   Outcome Measure  Help needed turning from your back to your side while in a flat bed without using bedrails?: Total Help needed moving from lying on your back to sitting on the side of a flat bed without using bedrails?: Total Help needed moving to and from a bed to a chair (including a wheelchair)?: Total Help needed standing up from a chair using  your arms (e.g., wheelchair or bedside chair)?: Total Help needed to walk in hospital room?: Total Help needed climbing 3-5 steps with a railing? : Total 6 Click Score: 6    End of Session Equipment Utilized During Treatment: Oxygen Activity Tolerance: Patient tolerated treatment well;Treatment limited secondary to medical complications (Comment) Patient left: with nursing/sitter in room;in bed;Other (comment) (RN to transfer pt for head CT) Nurse Communication: Mobility status (HR) PT Visit Diagnosis: Unsteadiness on feet (R26.81);Other abnormalities of gait and mobility (R26.89);Muscle weakness (generalized) (M62.81);History of falling (Z91.81)     Time: 8651-8591 PT Time Calculation (min) (ACUTE ONLY): 20 min  Charges:    $Therapeutic Activity: 8-22 mins PT General Charges $$ ACUTE PT VISIT: 1 Visit                     Maxima Skelton B. Fleeta Lapidus PT, DPT Acute Rehabilitation Services Please use secure chat or  Call Office [phone]    Monica Zamora [facility] 09/09/2024, 2:49 PM

## 2024-09-09 NOTE — Progress Notes (Signed)
 Physical Therapy Treatment Patient Details Name: Monica Zamora MRN: [id number] DOB: Jul 04, 1951 Today's Date: 09/09/2024   History of Present Illness 73 y.o. female presents to [facility] 08/29/24 from OP cardiologist for CHF exacerbation and a-fib w/ RVR. Chest radiograph showed B hilar vascular congestion and small B pleural effusions. S/p  DCCV for AF RVR on 12/22, post-TEE developed flash pulmonary edema with hypotension, transferred to ICU. Converted back to afib RVR 12/23. PMHx: Afib, CAD s/p CABG, COPD, HFmrEF, HTN, PVD, HLD, CKD 3A,    PT Comments  Pt in bed drinking her potassium on entry, reporting she would like a better cocktail. Pt initially did not want to get up to recliner but upon sitting EoB willing to sit up because she feels like she will be able to bathe easier. Pt is min A for bed mobility and transfer from bed to recliner. Once up in recliner, pt able to perform LE exercises. Pt hopefull that cardioversion will be successful this tomorrow. D/c plan remain appropriate at this time. PT will continue to follow acutely.     If plan is discharge home, recommend the following: A lot of help with walking and/or transfers;A lot of help with bathing/dressing/bathroom;Assist for transportation;Help with stairs or ramp for entrance;Assistance with cooking/housework;Direct supervision/assist for medications management   Can travel by private vehicle      maybe  Equipment Recommendations  None recommended by PT       Precautions / Restrictions Precautions Precautions: Fall Recall of Precautions/Restrictions: Intact Precaution/Restrictions Comments: watch HR, in afib, and on 3L O2 Restrictions Weight Bearing Restrictions Per Provider Order: No     Mobility  Bed Mobility Overal bed mobility: Needs Assistance Bed Mobility: Supine to Sit           General bed mobility comments: inA to scoot to EOB    Transfers Overall transfer level: Needs assistance Equipment used:  Rolling walker (2 wheels) Transfers: Sit to/from Stand, Bed to chair/wheelchair/BSC Sit to Stand: Min assist   Step pivot transfers: Min assist       General transfer comment: good power up, min A for steadying with standing and stepping to recliner on her R    Ambulation/Gait               General Gait Details: pt reports she is tired and would like to just get up to the chair.         Balance Overall balance assessment: Mild deficits observed, not formally tested, Needs assistance, History of Falls                                          Communication Communication Communication: Impaired Factors Affecting Communication: Hearing impaired  Cognition Arousal: Alert Behavior During Therapy: WFL for tasks assessed/performed   PT - Cognitive impairments: No apparent impairments                         Following commands: Intact      Cueing Cueing Techniques: Verbal cues  Exercises General Exercises - Lower Extremity Ankle Circles/Pumps: AROM, Both, Seated, 10 reps Quad Sets: AROM, Both, Seated, 10 reps Gluteal Sets: AROM, Both, Seated, 10 reps Long Arc Quad: AROM, Both, Seated, 10 reps Hip Flexion/Marching: AROM, Both, Seated, 10 reps Toe Raises: AROM, Both, Seated, 10 reps Heel Raises: AROM, Both, Seated, 10 reps    General  Comments General comments (skin integrity, edema, etc.): HR 100-118bpm with therapy      Pertinent Vitals/Pain Pain Assessment Pain Assessment: No/denies pain     PT Goals (current goals can now be found in the care plan section) Acute Rehab PT Goals Patient Stated Goal: to go home Progress towards PT goals: Progressing toward goals    Frequency    Min 1X/week       AM-PAC PT 6 Clicks Mobility   Outcome Measure  Help needed turning from your back to your side while in a flat bed without using bedrails?: A Little Help needed moving from lying on your back to sitting on the side of a flat  bed without using bedrails?: A Little Help needed moving to and from a bed to a chair (including a wheelchair)?: A Little Help needed standing up from a chair using your arms (e.g., wheelchair or bedside chair)?: A Little Help needed to walk in hospital room?: A Little Help needed climbing 3-5 steps with a railing? : A Lot 6 Click Score: 17    End of Session Equipment Utilized During Treatment: Gait belt;Oxygen Activity Tolerance: Patient tolerated treatment well Patient left: with call bell/phone within reach;in chair;with chair alarm set;with nursing/sitter in room Nurse Communication: Mobility status;Other (comment) (HR) PT Visit Diagnosis: Unsteadiness on feet (R26.81);Other abnormalities of gait and mobility (R26.89);Muscle weakness (generalized) (M62.81);History of falling (Z91.81)     Time: 8383-8361 PT Time Calculation (min) (ACUTE ONLY): 22 min  Charges:    $Therapeutic Exercise: 8-22 mins PT General Charges $$ ACUTE PT VISIT: 1 Visit                     Semisi Biela B. Fleeta Lapidus PT, DPT Acute Rehabilitation Services Please use secure chat or  Call Office [phone]    Almarie KATHEE Fleeta [facility] 09/09/2024, 4:47 PM

## 2024-09-10 ENCOUNTER — Inpatient Hospital Stay (HOSPITAL_COMMUNITY)

## 2024-09-10 DIAGNOSIS — R570 Cardiogenic shock: Secondary | ICD-10-CM | POA: Diagnosis not present

## 2024-09-10 DIAGNOSIS — I5023 Acute on chronic systolic (congestive) heart failure: Secondary | ICD-10-CM | POA: Diagnosis not present

## 2024-09-10 DIAGNOSIS — I5082 Biventricular heart failure: Secondary | ICD-10-CM | POA: Diagnosis not present

## 2024-09-10 DIAGNOSIS — I4819 Other persistent atrial fibrillation: Secondary | ICD-10-CM | POA: Diagnosis not present

## 2024-09-10 LAB — BASIC METABOLIC PANEL WITH GFR
Anion gap: 11 (ref 5–15)
Anion gap: 13 (ref 5–15)
BUN: 30 mg/dL — ABNORMAL HIGH (ref 8–23)
BUN: 26 mg/dL — ABNORMAL HIGH (ref 8–23)
CO2: 33 mmol/L — ABNORMAL HIGH (ref 22–32)
CO2: 31 mmol/L (ref 22–32)
Calcium: 8.4 mg/dL — ABNORMAL LOW (ref 8.9–10.3)
Calcium: 8.7 mg/dL — ABNORMAL LOW (ref 8.9–10.3)
Chloride: 91 mmol/L — ABNORMAL LOW (ref 98–111)
Chloride: 91 mmol/L — ABNORMAL LOW (ref 98–111)
Creatinine, Ser: 2.05 mg/dL — ABNORMAL HIGH (ref 0.44–1.00)
Creatinine, Ser: 1.84 mg/dL — ABNORMAL HIGH (ref 0.44–1.00)
GFR, Estimated: 25 mL/min — ABNORMAL LOW
GFR, Estimated: 29 mL/min — ABNORMAL LOW
Glucose, Bld: 188 mg/dL — ABNORMAL HIGH (ref 70–99)
Glucose, Bld: 243 mg/dL — ABNORMAL HIGH (ref 70–99)
Potassium: 3.4 mmol/L — ABNORMAL LOW (ref 3.5–5.1)
Potassium: 4.2 mmol/L (ref 3.5–5.1)
Sodium: 134 mmol/L — ABNORMAL LOW (ref 135–145)
Sodium: 134 mmol/L — ABNORMAL LOW (ref 135–145)

## 2024-09-10 LAB — COOXEMETRY PANEL
Carboxyhemoglobin: 1.9 % — ABNORMAL HIGH (ref 0.5–1.5)
Carboxyhemoglobin: 2.1 % — ABNORMAL HIGH (ref 0.5–1.5)
Methemoglobin: 0.7 % (ref 0.0–1.5)
Methemoglobin: <0.7 % (ref 0.0–1.5)
O2 Saturation: 79 %
O2 Saturation: 80 %
Total hemoglobin: 8.4 g/dL — ABNORMAL LOW (ref 12.0–16.0)
Total hemoglobin: 10 g/dL — ABNORMAL LOW (ref 12.0–16.0)

## 2024-09-10 LAB — CBC
HCT: 25.5 % — ABNORMAL LOW (ref 36.0–46.0)
Hemoglobin: 8.3 g/dL — ABNORMAL LOW (ref 12.0–15.0)
MCH: 36.2 pg — ABNORMAL HIGH (ref 26.0–34.0)
MCHC: 32.5 g/dL (ref 30.0–36.0)
MCV: 111.4 fL — ABNORMAL HIGH (ref 80.0–100.0)
Platelets: 178 K/uL (ref 150–400)
RBC: 2.29 MIL/uL — ABNORMAL LOW (ref 3.87–5.11)
RDW: 15 % (ref 11.5–15.5)
WBC: 4.8 K/uL (ref 4.0–10.5)
nRBC: 0 % (ref 0.0–0.2)

## 2024-09-10 LAB — GLUCOSE, CAPILLARY
Glucose-Capillary: 94 mg/dL (ref 70–99)
Glucose-Capillary: 106 mg/dL — ABNORMAL HIGH (ref 70–99)
Glucose-Capillary: 120 mg/dL — ABNORMAL HIGH (ref 70–99)
Glucose-Capillary: 115 mg/dL — ABNORMAL HIGH (ref 70–99)

## 2024-09-10 LAB — MAGNESIUM: Magnesium: 2 mg/dL (ref 1.7–2.4)

## 2024-09-10 MED ORDER — POTASSIUM CHLORIDE CRYS ER 20 MEQ PO TBCR
60.0000 meq | EXTENDED_RELEASE_TABLET | Freq: Once | ORAL | Status: AC
  Administered 2024-09-10: 60 meq via ORAL
  Filled 2024-09-10: qty 3

## 2024-09-10 MED ORDER — SORBITOL 70 % SOLN
30.0000 mL | Freq: Once | Status: AC
  Administered 2024-09-10: 30 mL via ORAL
  Filled 2024-09-10: qty 30

## 2024-09-10 MED ORDER — POTASSIUM CHLORIDE CRYS ER 20 MEQ PO TBCR: 20.0000 meq | EXTENDED_RELEASE_TABLET | ORAL | Status: DC

## 2024-09-10 MED ORDER — MIDAZOLAM HCL (PF) 2 MG/2ML IJ SOLN
2.0000 mg | Freq: Once | INTRAMUSCULAR | Status: AC
  Administered 2024-09-10: 2 mg via INTRAVENOUS

## 2024-09-10 MED ORDER — FUROSEMIDE 10 MG/ML IJ SOLN
INTRAMUSCULAR | Status: AC
  Administered 2024-09-10: 80 mg via INTRAVENOUS
  Filled 2024-09-10: qty 8

## 2024-09-10 MED ORDER — MIDAZOLAM HCL 2 MG/2ML IJ SOLN
INTRAMUSCULAR | Status: AC
  Filled 2024-09-10: qty 2

## 2024-09-10 MED ORDER — SENNA 8.6 MG PO TABS
2.0000 | ORAL_TABLET | Freq: Every day | ORAL | Status: DC
  Administered 2024-09-10: 17.2 mg via ORAL
  Filled 2024-09-10: qty 2

## 2024-09-10 MED ORDER — ETOMIDATE 2 MG/ML IV SOLN: 5.0000 mg | Freq: Once | INTRAVENOUS | Status: AC

## 2024-09-10 MED ORDER — POLYETHYLENE GLYCOL 3350 17 G PO PACK
17.0000 g | PACK | Freq: Every day | ORAL | Status: DC
  Administered 2024-09-10 – 2024-09-11 (×2): 17 g via ORAL
  Filled 2024-09-10 (×2): qty 1

## 2024-09-10 MED ORDER — KETAMINE HCL 50 MG/5ML IJ SOSY
0.5000 mg/kg | PREFILLED_SYRINGE | INTRAMUSCULAR | Status: AC | PRN
  Administered 2024-09-10 (×2): 38 mg via INTRAVENOUS

## 2024-09-10 MED ORDER — MAGNESIUM SULFATE 2 GM/50ML IV SOLN
2.0000 g | Freq: Once | INTRAVENOUS | Status: AC
  Administered 2024-09-10: 2 g via INTRAVENOUS
  Filled 2024-09-10: qty 50

## 2024-09-10 MED ORDER — LORAZEPAM 2 MG/ML IJ SOLN
1.0000 mg | INTRAMUSCULAR | Status: DC | PRN
  Administered 2024-09-12: 1 mg via INTRAVENOUS

## 2024-09-10 MED ORDER — LORAZEPAM 2 MG/ML IJ SOLN
1.0000 mg | INTRAMUSCULAR | Status: DC | PRN
  Administered 2024-09-10: 1 mg via INTRAVENOUS
  Filled 2024-09-10: qty 1

## 2024-09-10 MED ORDER — MIDAZOLAM HCL (PF) 2 MG/2ML IJ SOLN
2.0000 mg | Freq: Once | INTRAMUSCULAR | Status: AC
  Administered 2024-09-10: 2 mg via INTRAVENOUS
  Filled 2024-09-10: qty 2

## 2024-09-10 MED ORDER — FUROSEMIDE 10 MG/ML IJ SOLN: 80.0000 mg | Freq: Once | INTRAMUSCULAR | Status: AC

## 2024-09-10 MED ORDER — ETOMIDATE 2 MG/ML IV SOLN
INTRAVENOUS | Status: AC
  Administered 2024-09-10: 5 mg via INTRAVENOUS
  Filled 2024-09-10: qty 10

## 2024-09-10 MED ORDER — KETAMINE HCL 50 MG/5ML IJ SOSY
0.5000 mg/kg | PREFILLED_SYRINGE | INTRAMUSCULAR | Status: AC
  Filled 2024-09-10 (×2): qty 5

## 2024-09-10 NOTE — Progress Notes (Signed)
 "   Advanced Heart Failure Rounding Note  AH[facility] Cardiologist: Dr. Zenaida Chief Complaint: Shock Subjective:     73 y.o. female with a history of paroxysmal atrial fibrillation, chronic hypoxemic respiratory failure on 2 L nasal cannula, CAD status post CABG in 2021 admitted with recurrent AF c/b tachycardia CM with EF < 20%  12/22: DCCV for AF RVR. Post-TEE developed flash pulmonary edema with hypotension/shock. -> ICU. AF recurred  Now on milrinone  0.125. NE off  co-ox 79% Diuresing on IV lasix   CVP 10   Remains in AF with RVR  Denies CP or SOB.   Objective:    Vital Signs:   Temp:  [97.8 F (36.6 C)-98.6 F (37 C)] 98.6 F (37 C) (12/27 1112) Pulse Rate:  [40-141] 101 (12/27 1445) Resp:  [19-49] 35 (12/27 1445) BP: (78-127)/(44-99) 125/51 (12/27 1445) SpO2:  [89 %-100 %] 89 % (12/27 1445) FiO2 (%):  [28 %] 28 % (12/26 2041) Weight:  [76.5 kg] 76.5 kg (12/27 0429) Last BM Date : 09/02/24  Weight change: Filed Weights   09/08/24 0500 09/09/24 0500 09/10/24 0429  Weight: 76.1 kg 73.1 kg 76.5 kg   Intake/Output:  Intake/Output Summary (Last 24 hours) at 09/10/2024 1534 Last data filed at 09/10/2024 1200 Gross per 24 hour  Intake 552.11 ml  Output 3750 ml  Net -3197.89 ml    Physical Exam: General:  Elderly woman sitting up in bed. No resp difficulty HEENT: normal Neck: supple. Jvp to jaw Cor: irreg tachy . No rubs, gallops or murmurs. Lungs: Decreased  Abdomen: soft, nontender, nondistended.Good bowel sounds. Extremities: no cyanosis, clubbing, rash, 1+edema Neuro: alert & orientedx3, cranial nerves grossly intact. moves all 4 extremities w/o difficulty. Affect pleasant  Telemetry: AF 110-130  (personally reviewed)  Labs: Basic Metabolic Panel: Recent Labs  Lab 09/04/24 0211 09/05/24 0547 09/06/24 0441 09/06/24 1415 09/07/24 0414 09/08/24 0414 09/08/24 1633 09/09/24 0413 09/09/24 1027 09/09/24 1640 09/10/24 0409  NA 138   < > 135   < > 131* 133*  138 137  --  136 134*  K 3.4*   < > 5.4*   < > 4.0 3.2* 4.0 3.3* 3.4* 4.6 3.4*  CL 94*   < > 96*   < > 91* 91* 94* 92*  --  93* 91*  CO2 35*   < > 31   < > 32 34* 34* 35*  --  33* 33*  GLUCOSE 105*   < > 103*   < > 214* 203* 136* 142*  --  145* 188*  BUN 37*   < > 39*   < > 40* 36* 34* 33*  --  29* 30*  CREATININE 1.93*   < > 2.03*   < > 1.96* 2.06* 1.96* 1.84*  --  2.08* 2.05*  CALCIUM  8.7*   < > 9.1   < > 8.7* 8.4* 8.6* 8.4*  --  8.4* 8.4*  MG 2.0  --  2.2  --  2.2  --   --  2.1  --   --  2.0  PHOS 3.9  --   --   --   --   --   --   --   --   --   --    < > = values in this interval not displayed.   Liver Function Tests: Recent Labs  Lab 09/05/24 2120 09/06/24 1415 09/07/24 0414 09/08/24 0414  AST 194* 431* 252* 130*  ALT 81* 238* 209* 158*  ALKPHOS  288* 209* 186* 174*  BILITOT 1.5* 0.6 0.5 0.3  PROT 7.0 5.9* 5.7* 5.6*  ALBUMIN 3.8 3.2* 3.2* 3.1*   CBC: Recent Labs  Lab 09/05/24 2120 09/06/24 0441 09/07/24 0414 09/09/24 0413 09/10/24 0409  WBC 7.9 5.3 5.6 5.0 4.8  HGB 11.3* 9.1* 8.7* 8.3* 8.3*  HCT 35.5* 28.3* 27.4* 27.0* 25.5*  MCV 110.6* 111.0* 112.8* 112.0* 111.4*  PLT 249 161 156 174 178   BNP (last 3 results) Recent Labs    07/27/24 0930 08/29/24 1719  BNP 1,340.4* 2,397.9*   ProBNP (last 3 results) Recent Labs    09/05/24 2120  PROBNP 19,057.0*   Medications:    Scheduled Medications:  apixaban   5 mg Oral BID   arformoterol   15 mcg Nebulization BID   Chlorhexidine  Gluconate Cloth  6 each Topical Daily   multivitamin with minerals  1 tablet Oral Daily   mupirocin  ointment  1 Application Nasal BID   polyethylene glycol  17 g Oral Daily   pravastatin   20 mg Oral q1800   revefenacin   175 mcg Nebulization Daily   senna  2 tablet Oral QHS   sodium chloride  flush  10-40 mL Intracatheter Q12H   umeclidinium-vilanterol  1 puff Inhalation Daily    Infusions:  amiodarone  60 mg/hr (09/10/24 0439)   furosemide  (LASIX ) 200 mg in dextrose  5 % 100 mL (2  mg/mL) infusion 10 mg/hr (09/10/24 0426)   milrinone  0.125 mcg/kg/min (09/10/24 0426)   norepinephrine  (LEVOPHED ) Adult infusion Stopped (09/08/24 2332)    PRN Medications: acetaminophen  **OR** [DISCONTINUED] acetaminophen , bisacodyl , diazepam , menthol , ondansetron  **OR** ondansetron  (ZOFRAN ) IV, phenol, senna-docusate, sodium chloride  flush, traMADol   Assessment/Progress:   # Persistent A fib RVR - Last in SR 12/2023. Has had Persistent  A Fib  RVR over the last 3 months with minimal symptom until now. Previously on amio but stopped due to COPD. Tikosyn not an option with CKD.  - s/p TEE DC-CV 09/05/24 complicated but post-DC-CV shock - No beta blocker d/t shock - back in AF since 12/24. Rates 110-120s - continue IV amio - continue eliquis  5mg  bid - Plan DC-CV today - IF EF improves with SR will need eventual evaluation for ablation. If unable to maintain NSR in hospital may need AVN ablation and CRT-D   A/C Biventricular systolic HF HF>>cardiogenic shock - In 2024 EF improved 50-55% but most recent Echos with severely reduced biventricular HF. She had a cath with patent graphs.  - Suspect tachy mediated.  - TEE EF 20% severe RV HK. Severe TR mod MR - Decompensated overnight into 12/23. Lactic acid 5.4>3.8> 0.8 now clear - Co-ox 79% on milrinone  0.125. NE off this am  - Continue milrinone  and IV diuresis    AKI on CKD Stage IIIb - Creatinine baseline unclear baseline? ~ 1. 7-1.9 - Scr stabilized now on inotropes - Scr 1.7 today   Chronic Respiratory Failure, Oxygen Dependent, COPD - On 2L [place] - Sats stable.    CAD - H/O CABG 2021. Cath 2024 3 of 3 patent grafts LIMA-LAD, SVG-PDA, SVG-OM 3 - no s/s angina - On statin - No aspirin  with need for anticoagulation   Anemia  - H/O GI Bleed.  - 11/25 Colonsocopy with bleeding ulcer treated with hemoclip - Hgb stable   Hyperkalemia/Hypokalemia - supp k today  Shock liver - improving with support  CRITICAL CARE Performed  by: Toribio Fuel  Total critical care time: 35 minutes  -Critical care time was exclusive of separately billable procedures and treating  other patients. -Critical care was necessary to treat or prevent imminent or life-threatening deterioration. -Critical care was time spent personally by me on the following activities: development of treatment plan with patient and/or surrogate as well as nursing, discussions with consultants, evaluation of patient's response to treatment, examination of patient, obtaining history from patient or surrogate, ordering and performing treatments and interventions, ordering and review of laboratory studies, ordering and review of radiographic studies, pulse oximetry and re-evaluation of patient's condition.   Length of Stay: 25  Toribio Fuel 09/10/2024, 3:34 PM  Advanced Heart Failure Team Pager [phone] (M-F; 7a - 4p)  Please contact [facility] for night-coverage after hours (4p -7a ) and weekends on [email or web address]       "

## 2024-09-10 NOTE — Progress Notes (Addendum)
 "  NAME:  Monica Zamora, MRN:  [id number], DOB:  October 18, 1950, LOS: 12 ADMISSION DATE:  08/29/2024 CONSULTATION DATE: 09/05/2024 REFERRING MD:  Franky - [facility], CHIEF COMPLAINT:  SOB, hypotension   History of Present Illness:  73 year old woman who presented to [facility] 12/15 for LE edema/volume overload and SOB. PMHx significant for HTN, HLD, CAD s/p CABG (2021), HFrEF (Echo 07/2024 EF 20-25%), Afib (on Eliquis ), COPD with chronic hypoxic respiratory failure (on 2L HOT), former tobacco use, PUD/GIB (07/2024), CKD stage 3a. Recent admission 11/12-11/18 for Afib/RVR and GIB.  Patient was seen in follow up by Cardiology 12/15 and advised to present to ED for further evaluation and management of persistent AF/RVR, volume overload and urinary retention. Seen by Cardiology on ED arrival with recommendation for increased BB dosage and ongoing diuresis as tolerated. Admitted by TRH. Underwent close monitoring, attempted rate control for AF/RVR management and diuresis for management of volume overload. Developed AKI on CKD (in the setting of aggressive diuresis; this was adjusted.) GDMT limited due to kidney disease, frequent UTIs and COPD. AHF team consulted with plan for TEE/DCCV. Underwent TEE/DCCV 12/22 ([place]) with conversion to NSR.   Post-TEE, patient reported lightheadedness and SOB; she became tachypneic with RR 28-32. CXR demonstrated diffuse interstitial coarsening favoring pulmonary edema, small bilateral pleural effusions. On 12/22PM, patient was noted to be hypotensive with SBP 80s and concern for cardiogenic shock. Transferred to 2H.  PCCM was consulted for further evaluation and management.  Pertinent Medical History:   Past Medical History:  Diagnosis Date   A-fib (HCC)    Carotid artery disease    COPD (chronic obstructive pulmonary disease) (HCC)    Coronary artery disease    Former tobacco use    Heart failure with mildly reduced ejection fraction (HFmrEF) (HCC)    Hx of CABG     Hypertension    PUD (peptic ulcer disease)    Significant Hospital Events: Including procedures, antibiotic start and stop dates in addition to other pertinent events   12/15 - Admitted to [facility] via Cardiology clinic. Cardiology formally consulted. Admitted by [facility]. 12/19 - AHF consulted. Plan for TEE/DCCV. 12/22 - Underwent TEE/DCCV with conversion to NSR. In PM, hypotensive prompting call to PCCM. Transferred to [facility] 12/23 remain on dobutamine  and Levophed , not making much urine was given Lasix  and metolazone  in the evening.  Remain on 2 to 3 L nasal cannula oxygen.  In the evening she choked after drinking water, she became hypoxic into 60s, requiring nonrebreather facemask 12/24 back on 2 L nasal cannula oxygen, baseline, remained on dobutamine  at 5 and Levophed  at 8.  Went back into A-fib with RVR heart rate ranging 120s, started on amiodarone  12/26 remains in A-fib with RVR, -10 L since admission with excellent clinical response.  Remains on amiodarone  as well as milrinone  and Lasix  drip.  No longer pressor dependent  Interim History / Subjective:  NPO since midnight. Remains on 3L . Remains on milrinone  0.157mcg, amiodarone , lasix .   Objective:  Blood pressure 114/64, pulse (!) 119, temperature 97.8 F (36.6 C), temperature source Oral, resp. rate (!) 46, height 5' 6 (1.676 m), weight 76.5 kg, SpO2 100%. CVP:  [6 mmHg-63 mmHg] 11 mmHg  FiO2 (%):  [28 %] 28 %   Intake/Output Summary (Last 24 hours) at 09/10/2024 0931 Last data filed at 09/10/2024 0645 Gross per 24 hour  Intake 898.85 ml  Output 3680 ml  Net -2781.15 ml   Filed Weights   09/08/24 0500 09/09/24 0500 09/10/24  0429  Weight: 76.1 kg 73.1 kg 76.5 kg   Physical Examination: General elderly woman sitting up in bed in NAD, watching TV HEENT [place]/AT, eyes anicteric. Missing most of her teeth, Mallampati 3.  Pulmonary breathing comfortably on 3L [place].  Cardiac S1S2, tachycardic, irreg rhythm Abdomen obese, soft,  NT Extremities warm, dry, no rashes. Mild LE edema.  Coox 79% BUN 30 Cr 2.05 WBC 4.8 H/H 8.3/25.5 Platelets 178  Resolved Hospital Problem List:  Lactic acidosis Hyperkalemia Symptomatic bradycardia, likely medication induced  Assessment & Plan:  Acute on chronic biventricular HFrEF with cardiogenic shock  Coronary artery disease Paroxsymal Afib with RVR; previously cardioverted successfully.  -planning for TEE with cardioversion today under conscious sedation -con't amiodarone  and heparin  -milrinone  per AHF -Empiric magnesium  supplementation, K+ repletion in preparation for cardioversion. Will plan for ketamine  + versed  for cardioversion. ASA 4, Mallampati 3.  Acute on chronic hypoxic respiratory failure on home oxygen on 2 L O2 at home Acute pulmonary edema Aspiration pneumonitis COPD, not in exacerbation -complete 5 days of antibiotics -Anoro -wean O2 as able -pulmonary hygiene -con't diuresis  AKI on CKD stage IIIb -maintain adequate perfusion -strict I/O -renally dose meds, avoid nephrotoxic meds  Hypokalemia -replete -BID chemistries while on lasix  gtt  Recent GI bleeding in 07/2024 from lower source, Hb stable. -not on PPI -monitor for bleeding  Constipation -sorbitol , senna -mobility  Shock liver, also complicated by hepatic congestion.  LFTs have been improving -supportive care -periodically check LFTs  Acute urinary retention -con't bethanechol  -can retry voiding trial later   This patient is critically ill with multiple organ system failure which requires frequent high complexity decision making, assessment, support, evaluation, and titration of therapies. This was completed through the application of advanced monitoring technologies and extensive interpretation of multiple databases. During this encounter critical care time was devoted to patient care services described in this note for 33 minutes.  Leita SHAUNNA Gaskins, DO 09/10/2024 9:48  AM [facility]  For contact information, see Amion. If no response to pager, please call PCCM consult pager. After hours, 7PM- 7AM, please call Elink.    "

## 2024-09-10 NOTE — CV Procedure (Signed)
" ° °   DIRECT CURRENT CARDIOVERSION  NAME:  Monica Zamora   MRN: [id number] DOB:  1951/03/14   ADMIT DATE: 08/29/2024   INDICATIONS: Atrial fibrillation    PROCEDURE:   Informed consent was obtained prior to the procedure. The risks, benefits and alternatives for the procedure were discussed and the patient comprehended these risks. Once an appropriate time out was taken, the patient had the defibrillator pads placed in the anterior and posterior position. The patient then underwent sedation by the CCM service. Once an appropriate level of sedation was achieved, the patient received a single biphasic, synchronized 200J shock with prompt conversion to sinus rhythm. No apparent complications.  Toribio Fuel, MD  3:39 PM  "

## 2024-09-10 NOTE — Progress Notes (Signed)
 Sudden respiratory distress this afternoon- increased WOB, saturating in high 80s on NRB. Had similar episode after last cardioversion.  BP (!) 125/51   Pulse (!) 101   Temp 98.7 F (37.1 C) (Oral)   Resp (!) 35   Ht 5' 6 (1.676 m)   Wt 76.5 kg   SpO2 (!) 89%   BMI 27.22 kg/m  [place] on the R, reduced on the left but present.  Tachypneic Awake, able to speak in short phrases  CXR without pneumothorax, mild interstitial edema  Plan: STAT CXR Bipap Milrinone  increased to 0.64mcg Lasix  80mg  IV, con't gtt  D/w AHF.  Monica SHAUNNA Gaskins, DO 09/10/2024 5:09 PM [facility]  For contact information, see Amion. If no response to pager, please call PCCM consult pager. After hours, 7PM- 7AM, please call Elink.

## 2024-09-10 NOTE — Progress Notes (Signed)
 RT present at bedside for cardioversion. Pt remains on [place] at this time. Suction and ambu bag present.

## 2024-09-10 NOTE — Procedures (Signed)
" ° °  Moderate Conscious sedation note:  During this procedure the patient is administered a total of Versed  4mg  in divided doses and ketamine  40 mg x 2 doses. She required an additional etomidate  5mg  to achieve adequate sedation for cardioversion.  The patient's heart rate, blood pressure, and oxygen saturation are monitored continuously during the procedure. The period of conscious sedation is 29 min, of which I was present face-to-face 100% of this time.  See separate documentation for cardioversion.  Monica SHAUNNA Gaskins, DO 09/10/2024 12:46 PM [facility]  For contact information, see Amion. If no response to pager, please call PCCM consult pager. After hours, 7PM- 7AM, please call Elink.  "

## 2024-09-10 NOTE — Progress Notes (Signed)
 About 1655 Patient began to desat and c/o shortness of breath. Upon auscultation RN noted decreased breath sounds on the left side. Patient was placed on 15LO2 via non-rebreather. Dr. Gretta and Dr. Cherrie notified and at the bedside. See new orders. Stat CXR obtained & RT was called to place patient on Bipap per MD orders. 80mg  lasix  IV given per MD order,  Milrinone  increased per MD order; See MAR. Coox and BMP drawn per order. Patient remains monitored continuously.   Lauraine Ahle RN

## 2024-09-10 NOTE — Plan of Care (Signed)
  Problem: Education: Goal: Knowledge of General Education information will improve Description: Including pain rating scale, medication(s)/side effects and non-pharmacologic comfort measures Outcome: Progressing   Problem: Health Behavior/Discharge Planning: Goal: Ability to manage health-related needs will improve Outcome: Progressing   Problem: Clinical Measurements: Goal: Ability to maintain clinical measurements within normal limits will improve Outcome: Progressing Goal: Will remain free from infection Outcome: Progressing Goal: Diagnostic test results will improve Outcome: Progressing Goal: Respiratory complications will improve Outcome: Progressing Goal: Cardiovascular complication will be avoided Outcome: Progressing   Problem: Activity: Goal: Risk for activity intolerance will decrease Outcome: Progressing   Problem: Nutrition: Goal: Adequate nutrition will be maintained Outcome: Progressing   Problem: Coping: Goal: Level of anxiety will decrease Outcome: Progressing   Problem: Elimination: Goal: Will not experience complications related to bowel motility Outcome: Progressing Goal: Will not experience complications related to urinary retention Outcome: Progressing   Problem: Pain Managment: Goal: General experience of comfort will improve and/or be controlled Outcome: Progressing   Problem: Safety: Goal: Ability to remain free from injury will improve Outcome: Progressing   Problem: Skin Integrity: Goal: Risk for impaired skin integrity will decrease Outcome: Progressing   Problem: Education: Goal: Ability to demonstrate management of disease process will improve Outcome: Progressing Goal: Ability to verbalize understanding of medication therapies will improve Outcome: Progressing Goal: Individualized Educational Video(s) Outcome: Progressing   Problem: Activity: Goal: Capacity to carry out activities will improve Outcome: Progressing    Problem: Cardiac: Goal: Ability to achieve and maintain adequate cardiopulmonary perfusion will improve Outcome: Progressing   Problem: Education: Goal: Knowledge of disease or condition will improve Outcome: Progressing Goal: Understanding of medication regimen will improve Outcome: Progressing Goal: Individualized Educational Video(s) Outcome: Progressing   Problem: Activity: Goal: Ability to tolerate increased activity will improve Outcome: Progressing   Problem: Cardiac: Goal: Ability to achieve and maintain adequate cardiopulmonary perfusion will improve Outcome: Progressing   Problem: Health Behavior/Discharge Planning: Goal: Ability to safely manage health-related needs after discharge will improve Outcome: Progressing

## 2024-09-11 DIAGNOSIS — I5023 Acute on chronic systolic (congestive) heart failure: Secondary | ICD-10-CM | POA: Diagnosis not present

## 2024-09-11 LAB — BASIC METABOLIC PANEL WITH GFR
Anion gap: 10 (ref 5–15)
BUN: 26 mg/dL — ABNORMAL HIGH (ref 8–23)
CO2: 34 mmol/L — ABNORMAL HIGH (ref 22–32)
Calcium: 8.4 mg/dL — ABNORMAL LOW (ref 8.9–10.3)
Chloride: 89 mmol/L — ABNORMAL LOW (ref 98–111)
Creatinine, Ser: 1.76 mg/dL — ABNORMAL HIGH (ref 0.44–1.00)
GFR, Estimated: 30 mL/min — ABNORMAL LOW
Glucose, Bld: 269 mg/dL — ABNORMAL HIGH (ref 70–99)
Potassium: 3.4 mmol/L — ABNORMAL LOW (ref 3.5–5.1)
Sodium: 133 mmol/L — ABNORMAL LOW (ref 135–145)

## 2024-09-11 LAB — GLUCOSE, CAPILLARY
Glucose-Capillary: 138 mg/dL — ABNORMAL HIGH (ref 70–99)
Glucose-Capillary: 180 mg/dL — ABNORMAL HIGH (ref 70–99)
Glucose-Capillary: 98 mg/dL (ref 70–99)

## 2024-09-11 LAB — COOXEMETRY PANEL
Carboxyhemoglobin: 2.2 % — ABNORMAL HIGH (ref 0.5–1.5)
Methemoglobin: <0.7 % (ref 0.0–1.5)
O2 Saturation: 69.1 %
Total hemoglobin: 8.5 g/dL — ABNORMAL LOW (ref 12.0–16.0)

## 2024-09-11 LAB — CBC
HCT: 26.5 % — ABNORMAL LOW (ref 36.0–46.0)
Hemoglobin: 8.3 g/dL — ABNORMAL LOW (ref 12.0–15.0)
MCH: 34.6 pg — ABNORMAL HIGH (ref 26.0–34.0)
MCHC: 31.3 g/dL (ref 30.0–36.0)
MCV: 110.4 fL — ABNORMAL HIGH (ref 80.0–100.0)
Platelets: 222 K/uL (ref 150–400)
RBC: 2.4 MIL/uL — ABNORMAL LOW (ref 3.87–5.11)
RDW: 14.9 % (ref 11.5–15.5)
WBC: 6.1 K/uL (ref 4.0–10.5)
nRBC: 0 % (ref 0.0–0.2)

## 2024-09-11 LAB — HEPATIC FUNCTION PANEL
ALT: 73 U/L — ABNORMAL HIGH (ref 0–44)
AST: 44 U/L — ABNORMAL HIGH (ref 15–41)
Albumin: 3.2 g/dL — ABNORMAL LOW (ref 3.5–5.0)
Alkaline Phosphatase: 187 U/L — ABNORMAL HIGH (ref 38–126)
Bilirubin, Direct: 0.3 mg/dL — ABNORMAL HIGH (ref 0.0–0.2)
Indirect Bilirubin: 0.2 mg/dL — ABNORMAL LOW (ref 0.3–0.9)
Total Bilirubin: 0.5 mg/dL (ref 0.0–1.2)
Total Protein: 6.1 g/dL — ABNORMAL LOW (ref 6.5–8.1)

## 2024-09-11 LAB — MAGNESIUM: Magnesium: 2.5 mg/dL — ABNORMAL HIGH (ref 1.7–2.4)

## 2024-09-11 MED ORDER — POTASSIUM CHLORIDE CRYS ER 20 MEQ PO TBCR
40.0000 meq | EXTENDED_RELEASE_TABLET | Freq: Once | ORAL | Status: AC
  Administered 2024-09-11: 40 meq via ORAL
  Filled 2024-09-11: qty 2

## 2024-09-11 MED ORDER — BISACODYL 10 MG RE SUPP: 10.0000 mg | Freq: Once | RECTAL | Status: DC

## 2024-09-11 MED ORDER — POTASSIUM CHLORIDE CRYS ER 20 MEQ PO TBCR
20.0000 meq | EXTENDED_RELEASE_TABLET | Freq: Once | ORAL | Status: AC
  Administered 2024-09-11: 20 meq via ORAL
  Filled 2024-09-11: qty 1

## 2024-09-11 NOTE — Progress Notes (Signed)
 [facility] ADULT ICU REPLACEMENT PROTOCOL   The patient does apply for the [facility] Adult ICU Electrolyte Replacment Protocol based on the criteria listed below:   1.Exclusion criteria: TCTS, ECMO, Dialysis, and Myasthenia Gravis patients 2. Is GFR >/= 30 ml/min? Yes.    Patient's GFR today is 30 3. Is SCr </= 2? Yes.   Patient's SCr is 1.76 mg/dL 4. Did SCr increase >/= 0.5 in 24 hours? No. 5.Pt's weight >40kg  Yes.   6. Abnormal electrolyte(s): K+ 3.4  7. Electrolytes replaced per protocol 8.  Call MD STAT for K+ </= 2.5, Phos </= 1, or Mag </= 1 Physician:  Dr Marion Darner, Norleen Barters 09/11/2024 6:19 AM

## 2024-09-11 NOTE — Plan of Care (Signed)
  Problem: Education: Goal: Knowledge of General Education information will improve Description: Including pain rating scale, medication(s)/side effects and non-pharmacologic comfort measures Outcome: Progressing   Problem: Health Behavior/Discharge Planning: Goal: Ability to manage health-related needs will improve Outcome: Progressing   Problem: Clinical Measurements: Goal: Ability to maintain clinical measurements within normal limits will improve Outcome: Progressing Goal: Will remain free from infection Outcome: Progressing Goal: Diagnostic test results will improve Outcome: Progressing Goal: Respiratory complications will improve Outcome: Progressing Goal: Cardiovascular complication will be avoided Outcome: Progressing   Problem: Activity: Goal: Risk for activity intolerance will decrease Outcome: Progressing   Problem: Nutrition: Goal: Adequate nutrition will be maintained Outcome: Progressing   Problem: Coping: Goal: Level of anxiety will decrease Outcome: Progressing   Problem: Elimination: Goal: Will not experience complications related to bowel motility Outcome: Progressing Goal: Will not experience complications related to urinary retention Outcome: Progressing   Problem: Pain Managment: Goal: General experience of comfort will improve and/or be controlled Outcome: Progressing   Problem: Safety: Goal: Ability to remain free from injury will improve Outcome: Progressing   Problem: Skin Integrity: Goal: Risk for impaired skin integrity will decrease Outcome: Progressing   Problem: Education: Goal: Ability to demonstrate management of disease process will improve Outcome: Progressing Goal: Ability to verbalize understanding of medication therapies will improve Outcome: Progressing Goal: Individualized Educational Video(s) Outcome: Progressing   Problem: Activity: Goal: Capacity to carry out activities will improve Outcome: Progressing    Problem: Cardiac: Goal: Ability to achieve and maintain adequate cardiopulmonary perfusion will improve Outcome: Progressing   Problem: Education: Goal: Knowledge of disease or condition will improve Outcome: Progressing Goal: Understanding of medication regimen will improve Outcome: Progressing Goal: Individualized Educational Video(s) Outcome: Progressing   Problem: Activity: Goal: Ability to tolerate increased activity will improve Outcome: Progressing   Problem: Cardiac: Goal: Ability to achieve and maintain adequate cardiopulmonary perfusion will improve Outcome: Progressing   Problem: Health Behavior/Discharge Planning: Goal: Ability to safely manage health-related needs after discharge will improve Outcome: Progressing

## 2024-09-11 NOTE — Progress Notes (Signed)
 Pt was c/o panic and pulling off bipap mask, she looks comfortable and is awake and alert with bipap off and is in no distress, sats 100% on a few liters nasal cannula.  Will allow trial off for now.  Leita SAUNDERS Otisha Spickler, PA-C

## 2024-09-11 NOTE — Progress Notes (Signed)
 "  NAME:  Monica Zamora, MRN:  [id number], DOB:  [birth year], LOS: 13 ADMISSION DATE:  08/29/2024 CONSULTATION DATE: 09/05/2024 REFERRING MD:  Franky - TRH, CHIEF COMPLAINT:  SOB, hypotension   History of Present Illness:  73 year old woman who presented to [facility] 12/15 for LE edema/volume overload and SOB. PMHx significant for HTN, HLD, CAD s/p CABG (2021), HFrEF (Echo 07/2024 EF 20-25%), Afib (on Eliquis ), COPD with chronic hypoxic respiratory failure (on 2L HOT), former tobacco use, PUD/GIB (07/2024), CKD stage 3a. Recent admission 11/12-11/18 for Afib/RVR and GIB.  Patient was seen in follow up by Cardiology 12/15 and advised to present to ED for further evaluation and management of persistent AF/RVR, volume overload and urinary retention. Seen by Cardiology on ED arrival with recommendation for increased BB dosage and ongoing diuresis as tolerated. Admitted by TRH. Underwent close monitoring, attempted rate control for AF/RVR management and diuresis for management of volume overload. Developed AKI on CKD (in the setting of aggressive diuresis; this was adjusted.) GDMT limited due to kidney disease, frequent UTIs and COPD. AHF team consulted with plan for TEE/DCCV. Underwent TEE/DCCV 12/22 ([facility]) with conversion to NSR.   Post-TEE, patient reported lightheadedness and SOB; she became tachypneic with RR 28-32. CXR demonstrated diffuse interstitial coarsening favoring pulmonary edema, small bilateral pleural effusions. On 12/22PM, patient was noted to be hypotensive with SBP 80s and concern for cardiogenic shock. Transferred to 2H.  PCCM was consulted for further evaluation and management.  Pertinent Medical History:   Past Medical History:  Diagnosis Date   A-fib (HCC)    Carotid artery disease    COPD (chronic obstructive pulmonary disease) (HCC)    Coronary artery disease    Former tobacco use    Heart failure with mildly reduced ejection fraction (HFmrEF) (HCC)    Hx of CABG     Hypertension    PUD (peptic ulcer disease)    Significant Hospital Events: Including procedures, antibiotic start and stop dates in addition to other pertinent events   12/15 - Admitted to [facility] via Cardiology clinic. Cardiology formally consulted. Admitted by [facility]. 12/19 - AHF consulted. Plan for TEE/DCCV. 12/22 - Underwent TEE/DCCV with conversion to NSR. In PM, hypotensive prompting call to PCCM. Transferred to [facility] 12/23 remain on dobutamine  and Levophed , not making much urine was given Lasix  and metolazone  in the evening.  Remain on 2 to 3 L nasal cannula oxygen.  In the evening she choked after drinking water, she became hypoxic into 60s, requiring nonrebreather facemask 12/24 back on 2 L nasal cannula oxygen, baseline, remained on dobutamine  at 5 and Levophed  at 8.  Went back into A-fib with RVR heart rate ranging 120s, started on amiodarone  12/26 remains in A-fib with RVR, -10 L since admission with excellent clinical response.  Remains on amiodarone  as well as milrinone  and Lasix  drip.  No longer pressor dependent 12/27 cardioverted; later in the day flash pulmonary edema  Interim History / Subjective:  Better this morning after extra lasix  last night and bipap.  Objective:  Blood pressure (!) 113/47, pulse 80, temperature 99 F (37.2 C), temperature source Oral, resp. rate (!) 35, height 5' 6 (1.676 m), weight 75.1 kg, SpO2 97%. CVP:  [0 mmHg-34 mmHg] 8 mmHg  FiO2 (%):  [60 %] 60 %   Intake/Output Summary (Last 24 hours) at 09/11/2024 1809 Last data filed at 09/11/2024 1200 Gross per 24 hour  Intake 1557.53 ml  Output 1300 ml  Net 257.53 ml   [employer]  09/09/24 0500 09/10/24 0429 09/11/24 0700  Weight: 73.1 kg 76.5 kg 75.1 kg   Physical Examination: General sitting up in chair no acute distress HEENT [place]/AT, mostly edentulous, oral mucosa moist Pulmonary breathing comfortably on nasal cannula, no wheezing.  No conversational dyspnea.  No accessory muscle  use. Cardiac S1-S2, regular rate, irregular rhythm Abdomen obese, soft, nontender Extremities warm, well perfused  Coox 69% Na+ 133 K+ 3.4 BUN 26 Cr 1.76 AST 44 ALT 73 WBC 6.1 H/H 8.3/26.5 Platelets 222 BG 140-260s  Resolved Hospital Problem List:  Lactic acidosis Hyperkalemia Symptomatic bradycardia, likely medication induced  Assessment & Plan:  Acute on chronic biventricular HFrEF with cardiogenic shock  Coronary artery disease Paroxsymal Afib with RVR; previously cardioverted successfully.  --Continue amiodarone   -Restart apixaban  -Stopping Lasix  drip tonight -Continue milrinone , norepinephrine  as needed - Electrolyte repletion  Acute on chronic hypoxic respiratory failure on home oxygen on 2L HOT  Acute pulmonary edema Aspiration pneumonitis COPD, not in exacerbation -complete 5 days of antibiotics - ceftriaxone  -daily Anoro -When supplemental oxygen as able back to her home 2 L -Diuresis to stop today - Pulmonary hygiene  AKI on CKD stage IIIb -Maintain adequate perfusion - Strict I/os-renally dose meds and avoid nephrotoxic medications  Hypokalemia -Repleted today - Empiric additional repletion this evening, stopping Lasix   Recent GI bleeding in 07/2024 from lower source, Hb stable. -Monitor for bleeding, not requiring PPI given bleeding is resolved   Constipation - Continue sorbitol , senna -Adding Dulcolax suppository -mobility as able  Shock liver, also complicated by hepatic congestion.  LFTs have been improving -Supportive care -.  Recheck LFTs  Acute urinary retention -Bethanechol  - Planning on following removal trial tomorrow given the diuresis is held.    Leita SHAUNNA Gaskins, DO 09/11/2024 7:24 PM [facility]  For contact information, see Amion. If no response to pager, please call PCCM consult pager. After hours, 7PM- 7AM, please call Elink.    "

## 2024-09-11 NOTE — Progress Notes (Signed)
 Patient ID: Monica Zamora, female   DOB: 21-Sep-1950, 73 y.o.   MRN: [id number]    Advanced Heart Failure Rounding Note  AHF Cardiologist: Dr. Zenaida Chief Complaint: Shock Subjective:     73 y.o. female with a history of paroxysmal atrial fibrillation, chronic hypoxemic respiratory failure on 2 L nasal cannula, CAD status post CABG in 2021 admitted with recurrent AF c/b tachycardia CM with EF < 20%  12/22: DCCV for AF RVR. Post-TEE developed flash pulmonary edema with hypotension/shock. -> ICU. AF recurred 12/27: DCCV for AF/RVR, developed flash pulmonary edema afterwards requiring Bipap.   Now on milrinone  0.25, NE 4.  Co-ox 69%.  Lasix  gtt 10 mg/hr, CVP 6-7 today.  I/Os net negative 1411.  Creatinine 1.84 => 1.76.   NSR today on amiodarone  gtt 60 mg/hr and apixaban .   Denies CP or SOB. Wants to go home.   Objective:    Vital Signs:   Temp:  [97.7 F (36.5 C)-98.7 F (37.1 C)] 98.1 F (36.7 C) (12/28 0849) Pulse Rate:  [40-161] 70 (12/28 0845) Resp:  [21-62] 21 (12/28 0845) BP: (78-145)/(38-125) 117/38 (12/28 0845) SpO2:  [82 %-100 %] 100 % (12/28 0845) FiO2 (%):  [60 %] 60 % (12/27 2000) Weight:  [75.1 kg] 75.1 kg (12/28 0700) Last BM Date : 09/02/24  Weight change: Filed Weights   09/09/24 0500 09/10/24 0429 09/11/24 0700  Weight: 73.1 kg 76.5 kg 75.1 kg   Intake/Output:  Intake/Output Summary (Last 24 hours) at 09/11/2024 1118 Last data filed at 09/11/2024 0800 Gross per 24 hour  Intake 1314.01 ml  Output 1900 ml  Net -585.99 ml    Physical Exam: General: NAD Neck: No JVD, no thyromegaly or thyroid  nodule.  Lungs: Clear to auscultation bilaterally with normal respiratory effort. CV: Nondisplaced PMI.  Heart regular S1/S2, no S3/S4, no murmur.  No peripheral edema.   Abdomen: Soft, nontender, no hepatosplenomegaly, no distention.  Skin: Intact without lesions or rashes.  Neurologic: Alert and oriented x 3.  Psych: Normal affect. Extremities: No  clubbing or cyanosis.  HEENT: Normal.   Telemetry: NSR 80s  (personally reviewed)  Labs: Basic Metabolic Panel: Recent Labs  Lab 09/06/24 0441 09/06/24 1415 09/07/24 0414 09/08/24 0414 09/09/24 0413 09/09/24 1027 09/09/24 1640 09/10/24 0409 09/10/24 1731 09/11/24 0500  NA 135   < > 131*   < > 137  --  136 134* 134* 133*  K 5.4*   < > 4.0   < > 3.3* 3.4* 4.6 3.4* 4.2 3.4*  CL 96*   < > 91*   < > 92*  --  93* 91* 91* 89*  CO2 31   < > 32   < > 35*  --  33* 33* 31 34*  GLUCOSE 103*   < > 214*   < > 142*  --  145* 188* 243* 269*  BUN 39*   < > 40*   < > 33*  --  29* 30* 26* 26*  CREATININE 2.03*   < > 1.96*   < > 1.84*  --  2.08* 2.05* 1.84* 1.76*  CALCIUM  9.1   < > 8.7*   < > 8.4*  --  8.4* 8.4* 8.7* 8.4*  MG 2.2  --  2.2  --  2.1  --   --  2.0  --  2.5*   < > = values in this interval not displayed.   Liver Function Tests: Recent Labs  Lab 09/05/24 2120 09/06/24 1415 09/07/24 0414 09/08/24  0414 09/11/24 0500  AST 194* 431* 252* 130* 44*  ALT 81* 238* 209* 158* 73*  ALKPHOS 288* 209* 186* 174* 187*  BILITOT 1.5* 0.6 0.5 0.3 0.5  PROT 7.0 5.9* 5.7* 5.6* 6.1*  ALBUMIN 3.8 3.2* 3.2* 3.1* 3.2*   CBC: Recent Labs  Lab 09/06/24 0441 09/07/24 0414 09/09/24 0413 09/10/24 0409 09/11/24 0500  WBC 5.3 5.6 5.0 4.8 6.1  HGB 9.1* 8.7* 8.3* 8.3* 8.3*  HCT 28.3* 27.4* 27.0* 25.5* 26.5*  MCV 111.0* 112.8* 112.0* 111.4* 110.4*  PLT 161 156 174 178 222   BNP (last 3 results) Recent Labs    07/27/24 0930 08/29/24 1719  BNP 1,340.4* 2,397.9*   ProBNP (last 3 results) Recent Labs    09/05/24 2120  PROBNP 19,057.0*   Medications:    Scheduled Medications:  apixaban   5 mg Oral BID   arformoterol   15 mcg Nebulization BID   bisacodyl   10 mg Rectal Once   Chlorhexidine  Gluconate Cloth  6 each Topical Daily   multivitamin with minerals  1 tablet Oral Daily   polyethylene glycol  17 g Oral Daily   pravastatin   20 mg Oral q1800   revefenacin   175 mcg Nebulization  Daily   senna  2 tablet Oral QHS   sodium chloride  flush  10-40 mL Intracatheter Q12H   umeclidinium-vilanterol  1 puff Inhalation Daily    Infusions:  amiodarone  60 mg/hr (09/11/24 0800)   furosemide  (LASIX ) 200 mg in dextrose  5 % 100 mL (2 mg/mL) infusion 10 mg/hr (09/11/24 0800)   milrinone  0.25 mcg/kg/min (09/11/24 0800)   norepinephrine  (LEVOPHED ) Adult infusion 5 mcg/min (09/11/24 0800)    PRN Medications: acetaminophen  **OR** [DISCONTINUED] acetaminophen , bisacodyl , diazepam , LORazepam , menthol , ondansetron  **OR** ondansetron  (ZOFRAN ) IV, phenol, senna-docusate, sodium chloride  flush, traMADol   Assessment/Progress:   Persistent A fib RVR - Last in SR 12/2023. Has had Persistent  A Fib  RVR over the last 3 months with minimal symptom until now. Previously on amio but stopped due to COPD. Tikosyn not an option with CKD.  - s/p TEE DC-CV 09/05/24 complicated but post-DC-CV shock - No beta blocker d/t shock - back in AF since 12/24. Rates 110-120s => DCCV again on 12/27.  In NSR today.  - continue IV amiodarone , decrease when off milrinone .  - continue eliquis  5 mg bid - IF EF improves with SR will need eventual evaluation for ablation. If unable to maintain NSR in hospital may need AVN ablation and CRT-D   A/C Biventricular systolic HF HF>>cardiogenic shock - In 2024 EF improved 50-55% but most recent echoes with severely reduced biventricular HF. She had a cath with patent graphs.  - Suspect tachy-mediated cardiomyopathy.  - TEE EF 20% severe RV HK. Severe TR mod MR - Decompensated overnight into 12/23. Lactic acid 5.4>3.8> 0.8 now clear - Decompensated after DCCV on 12/27 with flash pulmonary edema.  Now better with CVP 6-7 and creatinine trending down.  Will continue Lasix  gtt during day today and stop this evening.  Reassess CVP in am to decide on diuretic dosing.  - Co-ox 69% on milrinone  0.25, NE 4. Decrease milrinone  to 0.125 today and wean NE as able.    AKI on CKD Stage  IIIb - Creatinine baseline unclear baseline? ~ 1. 7-1.9 - Scr stabilized now on inotropes - Scr 1.84 => 1.76 today   Chronic Respiratory Failure, Oxygen Dependent, COPD - On 2L [place] at home.  - Required Bipap post-DCCV on 12/27 with flash pulmonary edema, now back on  [place].    CAD - H/O CABG 2021. Cath 2024 3 of 3 patent grafts LIMA-LAD, SVG-PDA, SVG-OM 3 - no chest pain.  - On statin - No aspirin  with need for anticoagulation   Anemia  - H/O GI Bleed.  - 11/25 Colonsocopy with bleeding ulcer treated with hemoclip - Hgb stable at 8.3 today.   Hyperkalemia/Hypokalemia - supp k today  Shock liver - improving with support  CRITICAL CARE Performed by: Ezra Shuck  Total critical care time: 40 minutes  -Critical care time was exclusive of separately billable procedures and treating other patients. -Critical care was necessary to treat or prevent imminent or life-threatening deterioration. -Critical care was time spent personally by me on the following activities: development of treatment plan with patient and/or surrogate as well as nursing, discussions with consultants, evaluation of patient's response to treatment, examination of patient, obtaining history from patient or surrogate, ordering and performing treatments and interventions, ordering and review of laboratory studies, ordering and review of radiographic studies, pulse oximetry and re-evaluation of patient's condition.   Length of Stay: [address]  Ezra Shuck 09/11/2024, 11:18 AM  Advanced Heart Failure Team Pager [phone] (M-F; 7a - 4p)  Please contact [facility] for night-coverage after hours (4p -7a ) and weekends on [email or web address]

## 2024-09-12 DIAGNOSIS — I5023 Acute on chronic systolic (congestive) heart failure: Secondary | ICD-10-CM | POA: Diagnosis not present

## 2024-09-12 LAB — BASIC METABOLIC PANEL WITH GFR
Anion gap: 11 (ref 5–15)
Anion gap: 11 (ref 5–15)
BUN: 26 mg/dL — ABNORMAL HIGH (ref 8–23)
BUN: 26 mg/dL — ABNORMAL HIGH (ref 8–23)
CO2: 33 mmol/L — ABNORMAL HIGH (ref 22–32)
CO2: 31 mmol/L (ref 22–32)
Calcium: 8.2 mg/dL — ABNORMAL LOW (ref 8.9–10.3)
Calcium: 8.5 mg/dL — ABNORMAL LOW (ref 8.9–10.3)
Chloride: 92 mmol/L — ABNORMAL LOW (ref 98–111)
Chloride: 92 mmol/L — ABNORMAL LOW (ref 98–111)
Creatinine, Ser: 1.88 mg/dL — ABNORMAL HIGH (ref 0.44–1.00)
Creatinine, Ser: 1.99 mg/dL — ABNORMAL HIGH (ref 0.44–1.00)
GFR, Estimated: 28 mL/min — ABNORMAL LOW
GFR, Estimated: 26 mL/min — ABNORMAL LOW
Glucose, Bld: 241 mg/dL — ABNORMAL HIGH (ref 70–99)
Glucose, Bld: 245 mg/dL — ABNORMAL HIGH (ref 70–99)
Potassium: 3.4 mmol/L — ABNORMAL LOW (ref 3.5–5.1)
Potassium: 4.8 mmol/L (ref 3.5–5.1)
Sodium: 135 mmol/L (ref 135–145)
Sodium: 133 mmol/L — ABNORMAL LOW (ref 135–145)

## 2024-09-12 LAB — GLUCOSE, CAPILLARY
Glucose-Capillary: 135 mg/dL — ABNORMAL HIGH (ref 70–99)
Glucose-Capillary: 137 mg/dL — ABNORMAL HIGH (ref 70–99)
Glucose-Capillary: 168 mg/dL — ABNORMAL HIGH (ref 70–99)

## 2024-09-12 LAB — HEMOGLOBIN A1C
Hgb A1c MFr Bld: 5.5 % (ref 4.8–5.6)
Mean Plasma Glucose: 111.15 mg/dL

## 2024-09-12 LAB — COOXEMETRY PANEL
Carboxyhemoglobin: 1.6 % — ABNORMAL HIGH (ref 0.5–1.5)
Methemoglobin: <0.7 % (ref 0.0–1.5)
O2 Saturation: 68.9 %
Total hemoglobin: 8.8 g/dL — ABNORMAL LOW (ref 12.0–16.0)

## 2024-09-12 LAB — CBC
HCT: 27.5 % — ABNORMAL LOW (ref 36.0–46.0)
Hemoglobin: 8.5 g/dL — ABNORMAL LOW (ref 12.0–15.0)
MCH: 34.7 pg — ABNORMAL HIGH (ref 26.0–34.0)
MCHC: 30.9 g/dL (ref 30.0–36.0)
MCV: 112.2 fL — ABNORMAL HIGH (ref 80.0–100.0)
Platelets: 207 K/uL (ref 150–400)
RBC: 2.45 MIL/uL — ABNORMAL LOW (ref 3.87–5.11)
RDW: 14.7 % (ref 11.5–15.5)
WBC: 5.6 K/uL (ref 4.0–10.5)
nRBC: 0 % (ref 0.0–0.2)

## 2024-09-12 LAB — MAGNESIUM: Magnesium: 2.3 mg/dL (ref 1.7–2.4)

## 2024-09-12 MED ORDER — SODIUM CHLORIDE 0.9 % IV SOLN: INTRAVENOUS | Status: DC

## 2024-09-12 MED ORDER — POTASSIUM CHLORIDE 20 MEQ PO PACK
40.0000 meq | PACK | Freq: Once | ORAL | Status: DC
  Filled 2024-09-12: qty 2

## 2024-09-12 MED ORDER — POTASSIUM CHLORIDE CRYS ER 20 MEQ PO TBCR
40.0000 meq | EXTENDED_RELEASE_TABLET | Freq: Once | ORAL | Status: AC
  Administered 2024-09-12: 40 meq via ORAL

## 2024-09-12 MED ORDER — ACETAZOLAMIDE 250 MG PO TABS
500.0000 mg | ORAL_TABLET | Freq: Once | ORAL | Status: AC
  Administered 2024-09-12: 500 mg via ORAL
  Filled 2024-09-12: qty 2

## 2024-09-12 MED ORDER — POTASSIUM CHLORIDE CRYS ER 20 MEQ PO TBCR
40.0000 meq | EXTENDED_RELEASE_TABLET | Freq: Once | ORAL | Status: DC
  Filled 2024-09-12: qty 2

## 2024-09-12 MED ORDER — FUROSEMIDE 10 MG/ML IJ SOLN
160.0000 mg | Freq: Two times a day (BID) | INTRAVENOUS | Status: DC
  Administered 2024-09-12: 160 mg via INTRAVENOUS
  Filled 2024-09-12 (×2): qty 16

## 2024-09-12 MED ORDER — IPRATROPIUM-ALBUTEROL 0.5-2.5 (3) MG/3ML IN SOLN
3.0000 mL | Freq: Once | RESPIRATORY_TRACT | Status: AC
  Administered 2024-09-12: 3 mL via RESPIRATORY_TRACT
  Filled 2024-09-12: qty 3

## 2024-09-12 MED ORDER — GLYCOPYRROLATE 0.2 MG/ML IJ SOLN: 0.2000 mg | INTRAMUSCULAR | Status: DC | PRN

## 2024-09-12 MED ORDER — ACETAZOLAMIDE 250 MG PO TABS: 500.0000 mg | ORAL_TABLET | Freq: Once | ORAL | Status: DC

## 2024-09-12 MED ORDER — FUROSEMIDE 10 MG/ML IJ SOLN
160.0000 mg | Freq: Once | INTRAVENOUS | Status: DC
  Administered 2024-09-12: 160 mg via INTRAVENOUS
  Filled 2024-09-12: qty 10

## 2024-09-12 MED ORDER — ACETAMINOPHEN 650 MG RE SUPP: 650.0000 mg | Freq: Four times a day (QID) | RECTAL | Status: DC | PRN

## 2024-09-12 MED ORDER — IPRATROPIUM-ALBUTEROL 0.5-2.5 (3) MG/3ML IN SOLN: 3.0000 mL | RESPIRATORY_TRACT | Status: DC | PRN

## 2024-09-12 MED ORDER — INSULIN ASPART 100 UNIT/ML IJ SOLN: 0.0000 [IU] | Freq: Every day | INTRAMUSCULAR | Status: DC

## 2024-09-12 MED ORDER — HALOPERIDOL LACTATE 5 MG/ML IJ SOLN
2.5000 mg | INTRAMUSCULAR | Status: DC | PRN
  Administered 2024-09-14 (×2): 5 mg via INTRAVENOUS
  Administered 2024-09-15: 2.5 mg via INTRAVENOUS
  Administered 2024-09-15: 5 mg via INTRAVENOUS
  Filled 2024-09-12 (×4): qty 1

## 2024-09-12 MED ORDER — GLYCOPYRROLATE 1 MG PO TABS: 1.0000 mg | ORAL_TABLET | ORAL | Status: DC | PRN

## 2024-09-12 MED ORDER — FUROSEMIDE 10 MG/ML IJ SOLN
20.0000 mg/h | INTRAVENOUS | Status: DC
  Filled 2024-09-12: qty 20

## 2024-09-12 MED ORDER — POTASSIUM CHLORIDE 20 MEQ PO PACK
40.0000 meq | PACK | Freq: Once | ORAL | Status: AC
  Administered 2024-09-12: 40 meq via ORAL
  Filled 2024-09-12: qty 2

## 2024-09-12 MED ORDER — INSULIN ASPART 100 UNIT/ML IJ SOLN
0.0000 [IU] | Freq: Three times a day (TID) | INTRAMUSCULAR | Status: DC
  Administered 2024-09-12: 1 [IU] via SUBCUTANEOUS
  Filled 2024-09-12: qty 1

## 2024-09-12 MED ORDER — REVEFENACIN 175 MCG/3ML IN SOLN: 175.0000 ug | Freq: Every day | RESPIRATORY_TRACT | Status: DC

## 2024-09-12 MED ORDER — MORPHINE SULFATE (PF) 2 MG/ML IV SOLN
2.0000 mg | INTRAVENOUS | Status: DC | PRN
  Administered 2024-09-12 – 2024-09-13 (×6): 2 mg via INTRAVENOUS
  Filled 2024-09-12 (×6): qty 1

## 2024-09-12 MED ORDER — POLYVINYL ALCOHOL 1.4 % OP SOLN: 1.0000 [drp] | Freq: Four times a day (QID) | OPHTHALMIC | Status: DC | PRN

## 2024-09-12 MED ORDER — BUDESONIDE 0.5 MG/2ML IN SUSP: 0.5000 mg | Freq: Two times a day (BID) | RESPIRATORY_TRACT | Status: DC

## 2024-09-12 MED ORDER — LORAZEPAM 2 MG/ML IJ SOLN
2.0000 mg | INTRAMUSCULAR | Status: DC | PRN
  Administered 2024-09-13: 2 mg via INTRAVENOUS
  Filled 2024-09-12: qty 1

## 2024-09-12 MED ORDER — ACETAMINOPHEN 325 MG PO TABS: 650.0000 mg | ORAL_TABLET | Freq: Four times a day (QID) | ORAL | Status: DC | PRN

## 2024-09-12 MED ORDER — HYDROXYZINE HCL 25 MG PO TABS
25.0000 mg | ORAL_TABLET | Freq: Three times a day (TID) | ORAL | Status: DC | PRN
  Administered 2024-09-12: 25 mg via ORAL
  Filled 2024-09-12: qty 1

## 2024-09-12 NOTE — Progress Notes (Signed)
 Physical Therapy Treatment Patient Details Name: Monica Zamora MRN: [id number] DOB: 1951/06/08 Today's Date: 09/12/2024   History of Present Illness 73 y.o. female presents to [facility] 08/29/24 from OP cardiologist for CHF exacerbation and a-fib w/ RVR. Chest radiograph showed B hilar vascular congestion and small B pleural effusions. S/p  DCCV for AF RVR on 12/22, post-TEE developed flash pulmonary edema with hypotension, transferred to ICU. Converted back to afib RVR 12/23. S/p cardioversion 12/27, later in the day flash pulmonary edema. PMHx: Afib, CAD s/p CABG, COPD, HFmrEF, HTN, PVD, HLD, CKD 3A,    PT Comments  The pt was easily fatigued today and limited by frequent/constant BM while standing. Extended period of time spent this session cleaning pt both in standing and once back in bed. She needed modA to power up to stand due to noted deficits in leg strength and power. She needed minA to balance and direct her to turn to ambulate the short distance from the recliner to the chair. She remains at risk for falls. Considering her functional decline and seemingly only PRN assistance at d/c, updated d/c recs to short-term rehab, < 3 hours/day.   Of note, pt decided to go comfort care after this session. As a result, PT will sign off.      If plan is discharge home, recommend the following: A lot of help with walking and/or transfers;A lot of help with bathing/dressing/bathroom;Assist for transportation;Help with stairs or ramp for entrance;Assistance with cooking/housework;Direct supervision/assist for medications management   Can travel by private vehicle     Yes  Equipment Recommendations  BSC/3in1;Wheelchair (measurements PT);Wheelchair cushion (measurements PT);Hospital bed    Recommendations for Other Services       Precautions / Restrictions Precautions Precautions: Fall Recall of Precautions/Restrictions: Intact Precaution/Restrictions Comments: watch HR, in afib, and on 14L  O2 Restrictions Weight Bearing Restrictions Per Provider Order: No     Mobility  Bed Mobility Overal bed mobility: Needs Assistance Bed Mobility: Sit to Supine, Rolling Rolling: Min assist, Mod assist, Used rails     Sit to supine: Mod assist, HOB elevated   General bed mobility comments: ModA to lift legs onto bed with return to supine. Min-modA to roll bil for pericare, extended period of time staying in sidelying CGA for pericare and linen change with pt holding onto rail    Transfers Overall transfer level: Needs assistance Equipment used: Rolling walker (2 wheels) Transfers: Sit to/from Stand, Bed to chair/wheelchair/BSC Sit to Stand: Mod assist   Step pivot transfers: Min assist       General transfer comment: Pt needed cues to push up from chair along with modA to power up to stand. Pt with BM immediately upon standing. MinA for balance stepping from recliner to EOB with RW support.    Ambulation/Gait Ambulation/Gait assistance: Min assist Gait Distance (Feet): 5 Feet Assistive device: Rolling walker (2 wheels) Gait Pattern/deviations: Step-through pattern, Decreased stride length, Shuffle, Trunk flexed Gait velocity: reduced Gait velocity interpretation: <1.31 ft/sec, indicative of household ambulator   General Gait Details: Pt takes slow, small, shuffling steps from the chair to the bed with RW support and minA for balance. Frequent VCs needed to direct pt to turn and back up to bed. Pt with frequent BM throughout, limiting further gait   Stairs             Wheelchair Mobility     Tilt Bed    Modified Rankin (Stroke Patients Only)       Balance Overall  balance assessment: Needs assistance, History of Falls Sitting-balance support: No upper extremity supported, Feet supported Sitting balance-Leahy Scale: Fair     Standing balance support: Bilateral upper extremity supported, During functional activity, Reliant on assistive device for  balance Standing balance-Leahy Scale: Poor Standing balance comment: reliant on RW and minA                            Communication Communication Communication: Impaired Factors Affecting Communication: Hearing impaired  Cognition Arousal: Alert Behavior During Therapy: WFL for tasks assessed/performed   PT - Cognitive impairments: No apparent impairments                         Following commands: Intact      Cueing Cueing Techniques: Verbal cues, Tactile cues  Exercises      General Comments General comments (skin integrity, edema, etc.): VSS on 14L O2      Pertinent Vitals/Pain Pain Assessment Pain Assessment: Faces Faces Pain Scale: Hurts little more Pain Location: generalized Pain Descriptors / Indicators: Discomfort, Moaning Pain Intervention(s): Monitored during session, Limited activity within patient's tolerance, Repositioned    Home Living                          Prior Function            PT Goals (current goals can now be found in the care plan section) Acute Rehab PT Goals Patient Stated Goal: to go home PT Goal Formulation: With patient Time For Goal Achievement: 2024/09/29 Potential to Achieve Goals: Fair Progress towards PT goals: Not progressing toward goals - comment (functional decline, limited by frequent BM)    Frequency    Min 2X/week      PT Plan      Co-evaluation              AM-PAC PT 6 Clicks Mobility   Outcome Measure  Help needed turning from your back to your side while in a flat bed without using bedrails?: A Lot Help needed moving from lying on your back to sitting on the side of a flat bed without using bedrails?: A Lot Help needed moving to and from a bed to a chair (including a wheelchair)?: A Little Help needed standing up from a chair using your arms (e.g., wheelchair or bedside chair)?: A Lot Help needed to walk in hospital room?: Total (<20 ft) Help needed climbing 3-5  steps with a railing? : Total 6 Click Score: 11    End of Session Equipment Utilized During Treatment: Oxygen Activity Tolerance: Other (comment);Patient limited by fatigue (limited by frequent BM) Patient left: with call bell/phone within reach;with nursing/sitter in room;in bed;with bed alarm set Nurse Communication: Mobility status (RN present throughout) PT Visit Diagnosis: Unsteadiness on feet (R26.81);Other abnormalities of gait and mobility (R26.89);Muscle weakness (generalized) (M62.81);History of falling (Z91.81)     Time: 8445-8371 PT Time Calculation (min) (ACUTE ONLY): 34 min  Charges:    $Therapeutic Activity: 23-37 mins PT General Charges $$ ACUTE PT VISIT: 1 Visit                     Theo Ferretti, PT, DPT Acute Rehabilitation Services  Office: [phone]    Theo CHRISTELLA Ferretti 09/12/2024, 5:42 PM

## 2024-09-12 NOTE — Plan of Care (Signed)
  Problem: Education: Goal: Knowledge of General Education information will improve Description: Including pain rating scale, medication(s)/side effects and non-pharmacologic comfort measures Outcome: Progressing   Problem: Health Behavior/Discharge Planning: Goal: Ability to manage health-related needs will improve Outcome: Progressing   Problem: Clinical Measurements: Goal: Ability to maintain clinical measurements within normal limits will improve Outcome: Progressing Goal: Will remain free from infection Outcome: Progressing Goal: Diagnostic test results will improve Outcome: Progressing Goal: Respiratory complications will improve Outcome: Progressing Goal: Cardiovascular complication will be avoided Outcome: Progressing   Problem: Activity: Goal: Risk for activity intolerance will decrease Outcome: Progressing   Problem: Nutrition: Goal: Adequate nutrition will be maintained Outcome: Progressing   Problem: Coping: Goal: Level of anxiety will decrease Outcome: Progressing   Problem: Elimination: Goal: Will not experience complications related to bowel motility Outcome: Progressing Goal: Will not experience complications related to urinary retention Outcome: Progressing   Problem: Pain Managment: Goal: General experience of comfort will improve and/or be controlled Outcome: Progressing   Problem: Safety: Goal: Ability to remain free from injury will improve Outcome: Progressing   Problem: Skin Integrity: Goal: Risk for impaired skin integrity will decrease Outcome: Progressing   Problem: Education: Goal: Ability to demonstrate management of disease process will improve Outcome: Progressing Goal: Ability to verbalize understanding of medication therapies will improve Outcome: Progressing Goal: Individualized Educational Video(s) Outcome: Progressing   Problem: Activity: Goal: Capacity to carry out activities will improve Outcome: Progressing    Problem: Cardiac: Goal: Ability to achieve and maintain adequate cardiopulmonary perfusion will improve Outcome: Progressing   Problem: Education: Goal: Knowledge of disease or condition will improve Outcome: Progressing Goal: Understanding of medication regimen will improve Outcome: Progressing Goal: Individualized Educational Video(s) Outcome: Progressing   Problem: Activity: Goal: Ability to tolerate increased activity will improve Outcome: Progressing   Problem: Cardiac: Goal: Ability to achieve and maintain adequate cardiopulmonary perfusion will improve Outcome: Progressing   Problem: Health Behavior/Discharge Planning: Goal: Ability to safely manage health-related needs after discharge will improve Outcome: Progressing

## 2024-09-12 NOTE — Progress Notes (Signed)
 Pt placed on bipap for increased shortness of breath/work of breathing.CCM made aware.   09/12/24 1325  BiPAP/CPAP/SIPAP  BiPAP/CPAP/SIPAP Pt Type Adult  BiPAP/CPAP/SIPAP SERVO  Mask Type Full face mask  Mask Size Medium  Set Rate 12 breaths/min  Respiratory Rate 32 breaths/min  IPAP 18 cmH20  EPAP 8 cmH2O  PEEP 8 cmH20  FiO2 (%) 40 %  Minute Ventilation 14.6  Leak 34  Peak Inspiratory Pressure (PIP) 18  Tidal Volume (Vt) 417  Patient Home Machine No  Patient Home Mask No  Patient Home Tubing No  Auto Titrate No  Press High Alarm 28 cmH2O  Nasal massage performed No (comment)  CPAP/SIPAP surface wiped down Yes  Device Plugged into RED Power Outlet Yes  BiPAP/CPAP /SiPAP Vitals  Pulse Rate 85  Resp (!) 32  BP (!) 140/126  SpO2 95 %  Bilateral Breath Sounds Diminished  MEWS Score/Color  MEWS Score 2  MEWS Score Color Yellow

## 2024-09-12 NOTE — Progress Notes (Signed)
" °   09/12/24 1545  BiPAP/CPAP/SIPAP  Reason BIPAP/CPAP not in use Other(comment) (taken off per CCM.onSTBY)  BiPAP/CPAP /SiPAP Vitals  Pulse Rate 83  Resp (!) 26  MEWS Score/Color  MEWS Score 2  MEWS Score Color Yellow    "

## 2024-09-12 NOTE — Progress Notes (Signed)
 eLink Physician-Brief Progress Note Patient Name: Monica Zamora DOB: 1951/04/01 MRN: [id number]   Date of Service  09/12/2024  HPI/Events of Note  Hypokalemia on a.m. labs.  Patient with CKD.  eICU Interventions  Oral replacement ordered.     Intervention Category Minor Interventions: Electrolytes abnormality - evaluation and management  Jerilynn Berg 09/12/2024, 6:43 AM

## 2024-09-12 NOTE — Progress Notes (Addendum)
 "  NAME:  Monica Zamora, MRN:  [id number], DOB:  September 05, 1951, LOS: 14 ADMISSION DATE:  08/29/2024 CONSULTATION DATE: 09/05/2024 REFERRING MD:  Franky - TRH, CHIEF COMPLAINT:  SOB, hypotension   History of Present Illness:  73 year old woman who presented to [facility] 12/15 for LE edema/volume overload and SOB. PMHx significant for HTN, HLD, CAD s/p CABG (2021), HFrEF (Echo 07/2024 EF 20-25%), Afib (on Eliquis ), COPD with chronic hypoxic respiratory failure (on 2L HOT), former tobacco use, PUD/GIB (07/2024), CKD stage 3a. Recent admission 11/12-11/18 for Afib/RVR and GIB.  Patient was seen in follow up by Cardiology 12/15 and advised to present to ED for further evaluation and management of persistent AF/RVR, volume overload and urinary retention. Seen by Cardiology on ED arrival with recommendation for increased BB dosage and ongoing diuresis as tolerated. Admitted by TRH. Underwent close monitoring, attempted rate control for AF/RVR management and diuresis for management of volume overload. Developed AKI on CKD (in the setting of aggressive diuresis; this was adjusted.) GDMT limited due to kidney disease, frequent UTIs and COPD. AHF team consulted with plan for TEE/DCCV. Underwent TEE/DCCV 12/22 (Bensimhon) with conversion to NSR.   Post-TEE, patient reported lightheadedness and SOB; she became tachypneic with RR 28-32. CXR demonstrated diffuse interstitial coarsening favoring pulmonary edema, small bilateral pleural effusions. On 12/22PM, patient was noted to be hypotensive with SBP 80s and concern for cardiogenic shock. Transferred to 2H.  PCCM was consulted for further evaluation and management.  Pertinent Medical History:   Past Medical History:  Diagnosis Date   A-fib (HCC)    Carotid artery disease    COPD (chronic obstructive pulmonary disease) (HCC)    Coronary artery disease    Former tobacco use    Heart failure with mildly reduced ejection fraction (HFmrEF) (HCC)    Hx of CABG     Hypertension    PUD (peptic ulcer disease)    Significant Hospital Events: Including procedures, antibiotic start and stop dates in addition to other pertinent events   12/15 - Admitted to [facility] via Cardiology clinic. Cardiology formally consulted. Admitted by [facility]. 12/19 - AHF consulted. Plan for TEE/DCCV. 12/22 - Underwent TEE/DCCV with conversion to NSR. In PM, hypotensive prompting call to PCCM. Transferred to [facility] 12/23 remain on dobutamine  and Levophed , not making much urine was given Lasix  and metolazone  in the evening.  Remain on 2 to 3 L nasal cannula oxygen.  In the evening she choked after drinking water, she became hypoxic into 60s, requiring nonrebreather facemask 12/24 back on 2 L nasal cannula oxygen, baseline, remained on dobutamine  at 5 and Levophed  at 8.  Went back into A-fib with RVR heart rate ranging 120s, started on amiodarone  12/26 remains in A-fib with RVR, -10 L since admission with excellent clinical response.  Remains on amiodarone  as well as milrinone  and Lasix  drip.  No longer pressor dependent 12/27 cardioverted; later in the day flash pulmonary edema. Nebs changed to anoro  12/28 stopped lasix   gtt 12/29 160 lasix , keep foley     Interim History / Subjective:   C/o anxiety and panic, SOB   CVP 15 -18 coox 69  Cr 1.88 K  3.4   Objective:  Blood pressure (!) 86/67, pulse 77, temperature (!) 97.4 F (36.3 C), temperature source Axillary, resp. rate (!) 31, height 5' 6 (1.676 m), weight 75.6 kg, SpO2 93%. CVP:  [0 mmHg-21 mmHg] 14 mmHg      Intake/Output Summary (Last 24 hours) at 09/12/2024 9060 Last data filed at  09/12/2024 0700 Gross per 24 hour  Intake 1091.37 ml  Output 1100 ml  Net -8.63 ml   Filed Weights   09/10/24 0429 09/11/24 0700 09/12/24 0500  Weight: 76.5 kg 75.1 kg 75.6 kg   Physical Examination: General chronically and acutely ill appearing elderly F  Psych: anxious mood and affect Neuro Awake alert oriented  HEENT NCAT pink mm   Pulm symmetrical chest expansion, shallow resp effort w incr rate. Some conversational dyspnea, clear  Cardiac rrr s1s2  Abdomen soft nontender  Extremities: non pitting BLE edema    Resolved Hospital Problem List:  Lactic acidosis Hyperkalemia Symptomatic bradycardia, likely medication induced  Assessment & Plan:   AoC BiV HFrEF Cariogenic shock CAD pAF  -failed cardioversion x2 P -amio -eliquis  -optimize lytes  -milrnone per HF, wean NE as able  -CVP is high 12/29 + incr dyspnea -- d/w HF, to get 160 lasix  + 500 diamox    AoC hypoxic resp failure COPD w/o acute exacerbation Pulm edema, cardiogenic  Aspiration pneumonitis  -incr dyspnea 12/29. Oxygenating ok, on eliquis - PE unlikely. No wheeze, crackles, rhonchi. shallow volumes. High CVP, extremity edema. COPD agents recently changed to puff from neb. Endor -think sx are multifactorial: anxiety + fluid + brittle lungs w underlying COPD  P -on anoro + brovana -  will order a 1x duoneb now, but might be anoro ellipta  needs to be changed to nebulized alternatives  -diurese as above  -OOB, PT/OT   CKD 3b, vs AKI on CKD 3b   HypoK P -follow renal indices, UOP  -replace K -- add'l dose to account for expected urinary losses w lasix  above   Elevated LFTs  -Follow PRN   Hx lower GIB Constipation, improved - cont bowel reg   Acute urinary retention  -bethanecol  -in light of diuresis plan, will keep foley 12/29  Hyperglycemia -SSI   Anxiety -PRN valium , ativan .   GOC -has failed cardioversion twice this admission -difficult spot w baseline renal dz + HF + COPD, trying to navigate volume status vs implications for organ systems  -might be reasonable to consult palliative care for GOC     CRITICAL CARE Performed by: Ronnald FORBES Gave   Total critical care time: 43 minutes  Critical care time was exclusive of separately billable procedures and treating other patients. Critical care was necessary to treat or  prevent imminent or life-threatening deterioration.  Critical care was time spent personally by me on the following activities: development of treatment plan with patient and/or surrogate as well as nursing, discussions with consultants, evaluation of patient's response to treatment, examination of patient, obtaining history from patient or surrogate, ordering and performing treatments and interventions, ordering and review of laboratory studies, ordering and review of radiographic studies, pulse oximetry and re-evaluation of patient's condition.    Ronnald Gave MSN, AGACNP-BC [place] [facility]/Critical [facility] Medicine Amion for pager  09/12/2024, 9:39 AM     "

## 2024-09-12 NOTE — Progress Notes (Addendum)
 "   Advanced Heart Failure Rounding Note  [facility] Cardiologist: Dr. Zenaida Chief Complaint: Shock Patient Profile:    73 y.o. female with a history of paroxysmal atrial fibrillation, chronic hypoxemic respiratory failure on 2 L nasal cannula, CAD status post CABG in 2021 admitted with recurrent AF c/b tachycardia CM with EF < 20%  Interval History:    12/22: DCCV for AF RVR. Post-TEE developed flash pulmonary edema with shock. > ICU. AF recurred 12/27: DCCV for AF/RVR, developed flash pulmonary edema afterwards requiring Bipap  Subjective:    Coox 69% on milrinone  0.125 + NE 3. CVP 16.  1.1L UOP. Lasix  gtt stopped yesterday evening.  Sitting up in bed. Having some shortness of breath this morning and overnight. She remains on 3L O2. Was up in the chair yesterday.   Objective:    Vital Signs:   Temp:  [97.4 F (36.3 C)-99 F (37.2 C)] 97.4 F (36.3 C) (12/29 0700) Pulse Rate:  [62-147] 77 (12/29 0500) Resp:  [10-58] 31 (12/29 0500) BP: (57-134)/(38-111) 86/67 (12/29 0500) SpO2:  [87 %-100 %] 93 % (12/29 0827) Weight:  [75.6 kg] 75.6 kg (12/29 0500) Last BM Date : 09/11/24  Weight change: Filed Weights   09/10/24 0429 09/11/24 0700 09/12/24 0500  Weight: 76.5 kg 75.1 kg 75.6 kg   Intake/Output:  Intake/Output Summary (Last 24 hours) at 09/12/2024 0840 Last data filed at 09/12/2024 0700 Gross per 24 hour  Intake 1091.37 ml  Output 1100 ml  Net -8.63 ml    Physical Exam: General: Frail appearing. No distress  Cardiac: JVP difficult to assess. No murmurs  Extremities: Warm and dry. Trace BLE edema.  Neuro: A&O x3. Affect pleasant.   Telemetry: SR 80s, PVCs (personally reviewed)  Labs: Basic Metabolic Panel: Recent Labs  Lab 09/07/24 0414 09/08/24 0414 09/09/24 0413 09/09/24 1027 09/09/24 1640 09/10/24 0409 09/10/24 1731 09/11/24 0500 09/12/24 0522  NA 131*   < > 137  --  136 134* 134* 133* 135  K 4.0   < > 3.3*   < > 4.6 3.4* 4.2 3.4* 3.4*  CL 91*   < >  92*  --  93* 91* 91* 89* 92*  CO2 32   < > 35*  --  33* 33* 31 34* 33*  GLUCOSE 214*   < > 142*  --  145* 188* 243* 269* 241*  BUN 40*   < > 33*  --  29* 30* 26* 26* 26*  CREATININE 1.96*   < > 1.84*  --  2.08* 2.05* 1.84* 1.76* 1.88*  CALCIUM  8.7*   < > 8.4*  --  8.4* 8.4* 8.7* 8.4* 8.2*  MG 2.2  --  2.1  --   --  2.0  --  2.5* 2.3   < > = values in this interval not displayed.   Liver Function Tests: Recent Labs  Lab 09/05/24 2120 09/06/24 1415 09/07/24 0414 09/08/24 0414 09/11/24 0500  AST 194* 431* 252* 130* 44*  ALT 81* 238* 209* 158* 73*  ALKPHOS 288* 209* 186* 174* 187*  BILITOT 1.5* 0.6 0.5 0.3 0.5  PROT 7.0 5.9* 5.7* 5.6* 6.1*  ALBUMIN 3.8 3.2* 3.2* 3.1* 3.2*   CBC: Recent Labs  Lab 09/07/24 0414 09/09/24 0413 09/10/24 0409 09/11/24 0500 09/12/24 0522  WBC 5.6 5.0 4.8 6.1 5.6  HGB 8.7* 8.3* 8.3* 8.3* 8.5*  HCT 27.4* 27.0* 25.5* 26.5* 27.5*  MCV 112.8* 112.0* 111.4* 110.4* 112.2*  PLT 156 174 178 222 207  BNP (last 3 results) Recent Labs    07/27/24 0930 08/29/24 1719  BNP 1,340.4* 2,397.9*   ProBNP (last 3 results) Recent Labs    09/05/24 2120  PROBNP 19,057.0*   Medications:    Scheduled Medications:  apixaban   5 mg Oral BID   arformoterol   15 mcg Nebulization BID   bisacodyl   10 mg Rectal Once   Chlorhexidine  Gluconate Cloth  6 each Topical Daily   multivitamin with minerals  1 tablet Oral Daily   polyethylene glycol  17 g Oral Daily   pravastatin   20 mg Oral q1800   senna  2 tablet Oral QHS   sodium chloride  flush  10-40 mL Intracatheter Q12H   umeclidinium-vilanterol  1 puff Inhalation Daily    Infusions:  amiodarone  60 mg/hr (09/12/24 0701)   milrinone  0.125 mcg/kg/min (09/12/24 0703)   norepinephrine  (LEVOPHED ) Adult infusion 3 mcg/min (09/12/24 0700)    PRN Medications: acetaminophen  **OR** [DISCONTINUED] acetaminophen , bisacodyl , diazepam , LORazepam , menthol , ondansetron  **OR** ondansetron  (ZOFRAN ) IV, phenol, senna-docusate,  sodium chloride  flush, traMADol   Assessment/Progress:   A/C Biventricular HFrEF>>Cardiogenic shock - In 2024 EF improved 50-55% but most recent echoes with severely reduced biventricular HF. She had a cath with patent graphs.  - Suspect tachy-mediated cardiomyopathy.  - TEE EF 20% severe RV HK. Severe TR mod MR - Decompensated overnight into 12/23. Lactic acid 5.4>3.8> 0.8 now clear - Decompensated after DCCV on 12/27 with flash pulmonary edema.   - Placed on lasix  gtt, diuresed to CVP 3-4 then lasix  stopped.  - CVP back up to 16 with respiratory symptoms.  - Give Lasix  160 mg bid + diamox  500 mg bid - Co-ox 69% on milrinone  0.125, NE 3. Wean NE as able.  - Given HF, CKD, and chronic respiratory failure, requiring inotrope support and supplemental O2 at baseline. Agree with consult to Palliative care for goals of care discussions.    Persistent Afib with RVR - Last in SR 4/25. Has had persistent Afib RVR over the last 3 months with minimal symptom until now.  - Previously on amio but stopped due to COPD. Tikosyn not an option with CKD.  - s/p TEE DCCV 09/05/24 complicated by post-DCCV shock - ERAF 09/07/24. S/p DCCV 09/10/24 c/b flash pulm edema - remains in NSR - continue IV amiodarone , decrease when off milrinone .  - continue eliquis  5 mg bid - If EF improves with SR will need eventual evaluation for ablation. If unable to maintain NSR in hospital may need AVN ablation and CRT-D. Refer to EP as an outpatient.   AKI on CKD Stage IIIb - Creatinine baseline unclear baseline? ~ 1.7-1.9 - stable ~1.7-1.8   Chronic Respiratory Failure, Oxygen Dependent, COPD - On 2L [place] at home.  - Required Bipap post-DCCV on 12/27 with flash pulmonary edema, now back on [place].    CAD - H/O CABG 2021. Cath 2024 3 of 3 patent grafts LIMA-LAD, SVG-PDA, SVG-OM 3 - no chest pain.  - On statin - No aspirin  with need for anticoagulation   Anemia  - H/O GI Bleed.  - 11/25 colonsocopy with bleeding ulcer  treated with hemoclip - Hgb stable at 8.5 today.   Hyperkalemia/Hypokalemia - supp k today - repeat BMET this afternoon  Shock liver - improving with support  CRITICAL CARE Performed by: Adalyn Pennock  Total critical care time: 10 minutes  -Critical care time was exclusive of separately billable procedures and treating other patients. -Critical care was necessary to treat or prevent imminent or life-threatening deterioration. -  Critical care was time spent personally by me on the following activities: development of treatment plan with patient and/or surrogate as well as nursing, discussions with consultants, evaluation of patient's response to treatment, examination of patient, obtaining history from patient or surrogate, ordering and performing treatments and interventions, ordering and review of laboratory studies, ordering and review of radiographic studies, pulse oximetry and re-evaluation of patient's condition.   Length of Stay: 14  Caterra Ostroff 09/12/2024, 8:40 AM  Advanced Heart Failure Team Pager [phone] (M-F; 7a - 4p)  Please contact [facility] for night-coverage after hours (4p -7a ) and weekends on [email or web address]   "

## 2024-09-12 NOTE — Telephone Encounter (Signed)
"  Patient is still admitted.   "

## 2024-09-12 NOTE — IPAL (Addendum)
" °  Met with Monica Zamora again after Monica Zamora (caregiver, chosen family) arrived. We talked about code status and Monica Zamora verified to [facility] DNR/I status. We talked about goals of care and Monica Zamora shared she wants to focus on comfort. We talked about what this entails, all questions answered. 2 additional friends (chosen family) arrived and Monica Zamora asked me to update them. All questions answered. Emotional support provided.   Plan for comfort care PRN morphine  PRN ativan   Can escalate if needed  Discussed unclear anticipated lifespan, though think likely within the next several days. Unclear if we will have a safe window to consider home hospice which I know would be Monica Zamora's preference. I suspect symptom burden / poor stability will likely necessitate need for inpatient comfort care and an in-hospital death.    Interdisciplinary Goals of Care Family Meeting   Date carried out: 09/12/2024  Location of the meeting: Bedside  Member's involved: Physician and Nurse Practitioner  Durable Power of Attorney or acting medical decision maker: pt     Discussion: We discussed goals of care for Monica Zamora .  Dr. Gretta and I discussed gravity of her current conditions. Unfortunately we are failing to make sustained progress and are instead moving backwards. We talked about measures like intubation and resuscitation, and it was agreed that this would not be beneficial and a decision was reached to update code status to DNR/I We talked about broader goals of care -- continuing current interventions though unlikely to provide much benefit vs focusing on symptom management as the end of life nears.  She is contemplating this. Asked me to speak with her caregiver, Monica Zamora, to explain how sick she is.  I spoke with him and after discussing, he understands her prognosis is poor. He is going to come to the hospital this afternoon to talk with Monica Zamora more.    Code status:   Code Status: Limited: Do not attempt  resuscitation (DNR) -DNR-LIMITED -Do Not Intubate/DNI    Disposition: Continue current acute care  Time spent for the meeting: 20 min     Monica Zamora Gave, NP  09/12/2024, 3:37 PM   "

## 2024-09-12 NOTE — TOC Progression Note (Addendum)
 Transition of Care [facility]) - Progression Note    Patient Details  Name: Monica Zamora MRN: [id number] Date of Birth: [birth year]  Transition of [facility] CM/SW Contact  Justina Delcia Czar, RN Phone Number: [phone] 09/12/2024, 1:24 PM  Clinical Narrative:     Patient lives at home alone with an in home caregiver from 8-5 pm. Has oxygen with [employer], Rollator, shower chair and bedside commode. Had [facility] for HH.  Will need PT/OT evaluation and recommendations.   Chart reviewed for discharge readiness, patient not medically stable for d/c. Inpatient CM/CSW will continue to monitor pt's advancement through interdisciplinary progression rounds.   If new pt transition needs arise, MD please place a TOC consult.    Expected Discharge Plan: Home w Home Health Services Barriers to Discharge: Continued Medical Work up   Expected Discharge Plan and Services   Discharge Planning Services: CM Consult Post Acute Care Choice: Home Health Living arrangements for the past 2 months: Single Family Home                   DME Agency: NA  HH Arranged: NA     Social Drivers of Health (SDOH) Interventions SDOH Screenings   Food Insecurity: No Food Insecurity (08/30/2024)  Housing: Low Risk (08/30/2024)  Transportation Needs: No Transportation Needs (08/30/2024)  Utilities: Not At Risk (08/30/2024)  Alcohol  Screen: Low Risk (07/29/2024)  Financial Resource Strain: Low Risk (07/29/2024)  Physical Activity: Unknown (12/10/2023)   Received from [facility]  Social Connections: Socially Isolated (08/30/2024)  Stress: No Stress Concern Present (12/10/2023)   Received from [facility]  Recent Concern: Stress - Stress Concern Present (11/06/2023)   Received from [facility]  Tobacco Use: Medium Risk (08/31/2024)    Readmission Risk Interventions     No data to display

## 2024-09-13 DIAGNOSIS — Z66 Do not resuscitate: Secondary | ICD-10-CM

## 2024-09-13 DIAGNOSIS — I5042 Chronic combined systolic (congestive) and diastolic (congestive) heart failure: Secondary | ICD-10-CM

## 2024-09-13 DIAGNOSIS — Z7189 Other specified counseling: Secondary | ICD-10-CM

## 2024-09-13 MED ORDER — DEXTROSE 50 % IV SOLN: 25.0000 g | INTRAVENOUS | Status: DC

## 2024-09-13 MED ORDER — LORAZEPAM 2 MG/ML IJ SOLN
2.0000 mg | INTRAMUSCULAR | Status: DC | PRN
  Administered 2024-09-13 – 2024-09-14 (×4): 4 mg via INTRAVENOUS
  Administered 2024-09-15: 2 mg via INTRAVENOUS
  Filled 2024-09-13 (×2): qty 2
  Filled 2024-09-13: qty 1
  Filled 2024-09-13 (×2): qty 2

## 2024-09-13 MED ORDER — MORPHINE SULFATE (PF) 2 MG/ML IV SOLN
2.0000 mg | INTRAVENOUS | Status: DC
  Administered 2024-09-13 – 2024-09-16 (×17): 2 mg via INTRAVENOUS
  Filled 2024-09-13 (×18): qty 1

## 2024-09-13 MED ORDER — BIOTENE DRY MOUTH MT LIQD
15.0000 mL | Freq: Two times a day (BID) | OROMUCOSAL | Status: DC
  Administered 2024-09-13 – 2024-09-16 (×6): 15 mL via OROMUCOSAL

## 2024-09-13 NOTE — Progress Notes (Signed)
 "  NAME:  Monica Zamora, MRN:  [id number], DOB:  02-28-1951, LOS: 15 ADMISSION DATE:  08/29/2024 CONSULTATION DATE: 09/05/2024 REFERRING MD:  Franky - TRH, CHIEF COMPLAINT:  SOB, hypotension   History of Present Illness:  73 year old woman who presented to [facility] 12/15 for LE edema/volume overload and SOB. PMHx significant for HTN, HLD, CAD s/p CABG (2021), HFrEF (Echo 07/2024 EF 20-25%), Afib (on Eliquis ), COPD with chronic hypoxic respiratory failure (on 2L HOT), former tobacco use, PUD/GIB (07/2024), CKD stage 3a. Recent admission 11/12-11/18 for Afib/RVR and GIB.  Patient was seen in follow up by Cardiology 12/15 and advised to present to ED for further evaluation and management of persistent AF/RVR, volume overload and urinary retention. Seen by Cardiology on ED arrival with recommendation for increased BB dosage and ongoing diuresis as tolerated. Admitted by TRH. Underwent close monitoring, attempted rate control for AF/RVR management and diuresis for management of volume overload. Developed AKI on CKD (in the setting of aggressive diuresis; this was adjusted.) GDMT limited due to kidney disease, frequent UTIs and COPD. AHF team consulted with plan for TEE/DCCV. Underwent TEE/DCCV 12/22 (Bensimhon) with conversion to NSR.   Post-TEE, patient reported lightheadedness and SOB; she became tachypneic with RR 28-32. CXR demonstrated diffuse interstitial coarsening favoring pulmonary edema, small bilateral pleural effusions. On 12/22PM, patient was noted to be hypotensive with SBP 80s and concern for cardiogenic shock. Transferred to 2H.  PCCM was consulted for further evaluation and management.  Pertinent Medical History:   Past Medical History:  Diagnosis Date   A-fib (HCC)    Carotid artery disease    COPD (chronic obstructive pulmonary disease) (HCC)    Coronary artery disease    Former tobacco use    Heart failure with mildly reduced ejection fraction (HFmrEF) (HCC)    Hx of CABG     Hypertension    PUD (peptic ulcer disease)    Significant Hospital Events: Including procedures, antibiotic start and stop dates in addition to other pertinent events   12/15 - Admitted to [facility] via Cardiology clinic. Cardiology formally consulted. Admitted by [facility]. 12/19 - AHF consulted. Plan for TEE/DCCV. 12/22 - Underwent TEE/DCCV with conversion to NSR. In PM, hypotensive prompting call to PCCM. Transferred to [facility] 12/23 remain on dobutamine  and Levophed , not making much urine was given Lasix  and metolazone  in the evening.  Remain on 2 to 3 L nasal cannula oxygen.  In the evening she choked after drinking water, she became hypoxic into 60s, requiring nonrebreather facemask 12/24 back on 2 L nasal cannula oxygen, baseline, remained on dobutamine  at 5 and Levophed  at 8.  Went back into A-fib with RVR heart rate ranging 120s, started on amiodarone  12/26 remains in A-fib with RVR, -10 L since admission with excellent clinical response.  Remains on amiodarone  as well as milrinone  and Lasix  drip.  No longer pressor dependent 12/27 cardioverted; later in the day flash pulmonary edema. Nebs changed to anoro  12/28 stopped lasix   gtt 12/29 160 lasix , keep foley     Interim History / Subjective:   C/o anxiety and panic, SOB   CVP 15 -18 coox 69  Cr 1.88 K  3.4   Objective:  Blood pressure 121/66, pulse 76, temperature 98.9 F (37.2 C), temperature source Axillary, resp. rate 16, height 5' 6 (1.676 m), weight 75.6 kg, SpO2 98%. CVP:  [5 mmHg-79 mmHg] 7 mmHg  Vent Mode: PCV;BIPAP FiO2 (%):  [40 %] 40 % Set Rate:  [12 bmp] 12 bmp PEEP:  [  8 cmH20] 8 cmH20   Intake/Output Summary (Last 24 hours) at 09/13/2024 0653 Last data filed at 09/13/2024 0500 Gross per 24 hour  Intake 577.99 ml  Output 595 ml  Net -17.01 ml   Filed Weights   09/10/24 0429 09/11/24 0700 09/12/24 0500  Weight: 76.5 kg 75.1 kg 75.6 kg   Physical Examination: General chronically and acutely ill appearing elderly F   Psych: anxious mood and affect Neuro Awake alert oriented  HEENT NCAT pink mm  Pulm symmetrical chest expansion, shallow resp effort w incr rate. Some conversational dyspnea, clear  Cardiac rrr s1s2  Abdomen soft nontender  Extremities: non pitting BLE edema    Resolved Hospital Problem List:  Lactic acidosis Hyperkalemia Symptomatic bradycardia, likely medication induced  Assessment & Plan:   GOC Encounter for palliative care DNR  Anxiety  AoC BiV HFrEF Cariogenic shock CAD pAF s/p cardioversion x 2  AoC hypoxic resp failure COPD on home O2 Cardiogenic pulmonary edema Aspiration pneumonitis  CKD 3b vs AKI on CKD Acute urinary retention  Hypokalemia Elevated LFTs, congestive hepatopathy  Hx lower GIB Constipation, improved  Hyperglycemia  P -no further medications not aimed at comfort. No labs, imaging.  -DNR -comfort care -- transitioned 12/29 -PRN morphine , ativan , valium   -can escalate if needed  -[place] would be for comfort only; not for SpO2 goal. At present pt is enjoying a break.  -pleasure feeds  -think most likely will require ongoing inpatient care for EOL symptoms, but might be candidate for residential hospice -think her sx tx requirements would not likely be appropriate for home hospice -will txf to palli floor 12/30. Palliative care consult is pending and we appreciate the ongoing support  -Will try to coordinate a visit from pts cat    Low MDM   Ronnald Gave MSN, AGACNP-BC [place] Pulmonary/Critical Care Medicine Amion for pager  09/13/2024, 6:54 AM    "

## 2024-09-13 NOTE — Consult Note (Signed)
 "                                                                                Consultation Note Date: 09/13/2024   Patient Name: Monica Zamora  DOB: 08/11/1951  MRN: [id number]  Age / Sex: 73 y.o., female  PCP: [name], NP Referring Physician: Gretta Leita SQUIBB, DO  Reason for Consultation: Establishing goals of care, GOC HF COPD CKD  HPI/Patient Profile: 73 y.o. female  with past medical history of persistent A-fib on Eliquis , HFrEF, CAD s/p CABG in 2021, carotid artery disease, COPD, chronic hypoxic respiratory failure on 2 L [place], CKD 3A, HLD, HTN, PVD and PUD who was sent by her outpatient cardiologist for evaluation of CHF exacerbation and A-fib with RVR.  Patient was admitted on 08/29/2024 with persistent A-fib with RVR, acute on chronic systolic heart failure, acute urinary retention, bilateral lower extremity edema.   Of note, patient has had 2 admissions in the last 6 months.  She is a 30-day readmission.  Clinical Assessment and Goals of Care: I have reviewed medical records including: EPIC notes: CCM, nursing, PT, respiratory therapy, TOC, heart failure, hospitalist.  After discussions with CCM, patient was transition to full comfort care on 12/29. MAR: Comfort medications per MAR.  As needed medications administered in the last 24 hours-morphine  x 6, Ativan  x 2, hydroxyzine  x 1, Valium  x 2 Available advanced directives in ACP: None Labs: Creatinine 1.99 assess for opioid prescribing and prognostication Vital signs: Oxygen saturations high 50-60s  Received report from primary RN -no acute concerns.  Per RN patient wanted to take her oxygen off this morning -saturations now in the 50s and 60s.  Patient should be transferring out of the ICU today.  RN also reports that family are coming to visit her today and they are hopeful to bring her cat.  Symptom management plan discussed with RN-Will schedule morphine  every 4 hours.  Went to visit patient at bedside - no  family/visitors present. Patient was lying in bed asleep - did not attempt to wake to preserve comfort. No signs or non-verbal gestures of pain or discomfort noted. No respiratory distress, increased work of breathing, or secretions noted.   Primary Decision Maker: PATIENT    SUMMARY OF RECOMMENDATIONS   Continue full comfort measures Continue DNR/DNI as previously documented Anticipate hospital death Continue current comfort focused medication regimen as noted below - changes are in bold Added orders to reflect full comfort measures, as well as discontinued orders that were not focused on comfort PMT will continue to follow and support holistically  Symptom Management Scheduled morphine  every 4 hours; continue as needed doses for breakthrough pain/dyspnea/increased work of breathing/RR>25 Tylenol  PRN pain/fever Added biotin twice daily Robinul PRN secretions Haldol  PRN agitation/delirium Ativan  PRN anxiety/seizure/sleep/distress Zofran  PRN nausea/vomiting Artificial tears PRN dry eye    Code Status/Advance Care Planning: DNR  Palliative Prophylaxis:  Aspiration, Frequent Pain Assessment, Oral Care, and Turn Reposition  Additional Recommendations (Limitations, Scope, Preferences): Full Comfort Care  Psycho-social/Spiritual:  Desire for further Chaplaincy support:no Created space and opportunity for patient to express thoughts and feelings regarding patient's current medical situation.  Emotional support and therapeutic listening provided.  Prognosis:  Hours - Days  Discharge Planning: Anticipated Hospital Death      Primary Diagnoses: Present on Admission:  Acute on chronic systolic congestive heart failure (HCC)  Paroxysmal atrial fibrillation (HCC)  CAD (coronary artery disease)  Essential hypertension  Hyperlipidemia  COPD (chronic obstructive pulmonary disease) (HCC)  PVD (peripheral vascular disease)  Depression  CKD stage 3a, GFR 45-59 ml/min  (HCC)   I have reviewed the medical record, interviewed the patient and family, and examined the patient. The following aspects are pertinent.  Past Medical History:  Diagnosis Date   A-fib [facility])    Carotid artery disease    COPD (chronic obstructive pulmonary disease) (HCC)    Coronary artery disease    Former tobacco use    Heart failure with mildly reduced ejection fraction (HFmrEF) (HCC)    Hx of CABG    Hypertension    PUD (peptic ulcer disease)    Social History   Socioeconomic History   Marital status: Divorced    Spouse name: Not on file   Number of children: 2   Years of education: Not on file   Highest education level: 11th grade  Occupational History   Occupation: Retired  Tobacco Use   Smoking status: Former    Current packs/day: 0.00    Average packs/day: 1.5 packs/day for 54.9 years (82.3 ttl pk-yrs)    Types: Cigarettes    Start date: 09/16/1967    Quit date: 08/05/2022    Years since quitting: 2.1   Smokeless tobacco: Never  Vaping Use   Vaping status: Never Used  Substance and Sexual Activity   Alcohol  use: Not Currently   Drug use: Never   Sexual activity: Not on file  Other Topics Concern   Not on file  Social History Narrative   Speaks with her sister multiple times a week and has a caregiver   Social Drivers of Health   Tobacco Use: Medium Risk (08/31/2024)   Patient History    Smoking Tobacco Use: Former    Smokeless Tobacco Use: Never    Passive Exposure: Not on Actuary Strain: Low Risk (07/29/2024)   Overall Financial Resource Strain (CARDIA)    Difficulty of Paying Living Expenses: Not hard at all  Food Insecurity: No Food Insecurity (08/30/2024)   Epic    Worried About Radiation Protection Practitioner of Food in the Last Year: Never true    Ran Out of Food in the Last Year: Never true  Transportation Needs: No Transportation Needs (08/30/2024)   Epic    Lack of Transportation (Medical): No    Lack of Transportation (Non-Medical): No   Physical Activity: Unknown (12/10/2023)   [facility]   Exercise Vital Sign    On average, how many days per week do you engage in moderate to strenuous exercise (like a brisk walk)?: 0 days    Minutes of Exercise per Session: Not on file  Stress: No Stress Concern Present (12/10/2023)   Received from [facility] of Occupational Health - Occupational Stress Questionnaire    Feeling of Stress : Only a little  Recent Concern: Stress - Stress Concern Present (11/06/2023)   Received from [facility] of Occupational Health - Occupational Stress Questionnaire    Feeling of Stress : Very much  Social Connections: Socially Isolated (08/30/2024)   Social Connection and Isolation Panel    Frequency of Communication with Friends and Family: Three times a week  Frequency of Social Gatherings with Friends and Family: Three times a week    Attends Religious Services: Never    Active Member of Clubs or Organizations: No    Attends Banker Meetings: Never    Marital Status: Divorced  Depression (PHQ2-9): Not on file  Alcohol  Screen: Low Risk (07/29/2024)   Alcohol  Screen    Last Alcohol  Screening Score (AUDIT): 0  Housing: Low Risk (08/30/2024)   Epic    Unable to Pay for Housing in the Last Year: No    Number of Times Moved in the Last Year: 0    Homeless in the Last Year: No  Utilities: Not At Risk (08/30/2024)   Epic    Threatened with loss of utilities: No  Health Literacy: Not on file   Family History  Problem Relation Age of Onset   Lupus Mother    Lung disease Neg Hx    Scheduled Meds:  bisacodyl   10 mg Rectal Once   Chlorhexidine  Gluconate Cloth  6 each Topical Daily   sodium chloride  flush  10-40 mL Intracatheter Q12H   Continuous Infusions:  sodium chloride      PRN Meds:.acetaminophen  **OR** acetaminophen , artificial tears, bisacodyl , diazepam , glycopyrrolate **OR** glycopyrrolate **OR**  glycopyrrolate, haloperidol  lactate, ipratropium-albuterol , LORazepam , menthol , morphine  injection, ondansetron  **OR** ondansetron  (ZOFRAN ) IV, phenol, senna-docusate, sodium chloride  flush Medications Prior to Admission:  Prior to Admission medications  Medication Sig Start Date End Date Taking? Authorizing Provider  albuterol  (VENTOLIN  HFA) 108 (90 Base) MCG/ACT inhaler Inhale 1 puff into the lungs 2 (two) times daily as needed for wheezing or shortness of breath. 07/10/21  Yes [provider]  apixaban  (ELIQUIS ) 5 MG TABS tablet Take 1 tablet (5 mg total) by mouth 2 (two) times daily. 08/12/23  Yes Pietro Redell RAMAN, MD  cyclobenzaprine (FLEXERIL) 10 MG tablet Take 10 mg by mouth at bedtime. 12/31/23  Yes [provider]  diazepam  (VALIUM ) 5 MG tablet Take 5 mg by mouth 2 (two) times daily as needed for anxiety. 11/06/23  Yes [provider]  furosemide  (LASIX ) 40 MG tablet Take 1.5 tablets (60 mg total) by mouth daily. 08/23/24  Yes Pietro Redell RAMAN, MD  metoprolol  succinate (TOPROL -XL) 50 MG 24 hr tablet Take 1.5 tablets (75 mg total) by mouth at bedtime. Take with or immediately following a meal. 08/23/24  Yes Crenshaw, Redell RAMAN, MD  Multiple Vitamin (MULTIVITAMIN WITH MINERALS) TABS tablet Take 1 tablet by mouth daily.   Yes [provider]  OXYGEN Inhale 2 L into the lungs continuous.   Yes [provider]  pravastatin  (PRAVACHOL ) 20 MG tablet Take 1 tablet (20 mg total) by mouth daily at 6 PM. 11/30/23 02/03/25 Yes Meng, Hao, PA  umeclidinium-vilanterol (ANORO ELLIPTA ) 62.5-25 MCG/ACT AEPB Inhale 1 puff into the lungs daily. 08/02/24  Yes [name], DO   Allergies[1] Review of Systems  Unable to perform ROS: Acuity of condition    Physical Exam Vitals and nursing note reviewed.  Constitutional:      General: She is not in acute distress.    Appearance: She is ill-appearing.  Pulmonary:     Effort: Pulmonary effort is normal. No  respiratory distress.  Skin:    General: Skin is warm and dry.  Neurological:     Mental Status: She is lethargic.     Motor: Weakness present.     Vital Signs: BP 121/66   Pulse 74   Temp 98.9 F (37.2 C) (Axillary)   Resp 15  Ht 5' 6 (1.676 m)   Wt 75.6 kg   SpO2 97%   BMI 26.90 kg/m  Pain Scale: Not given for pain   Pain Score: Asleep   SpO2: SpO2: 97 % O2 Device:SpO2: 97 % O2 Flow Rate: .O2 Flow Rate (L/min): 4 L/min  IO: Intake/output summary:  Intake/Output Summary (Last 24 hours) at 09/13/2024 9178 Last data filed at 09/13/2024 0500 Gross per 24 hour  Intake 493.4 ml  Output 560 ml  Net -66.6 ml    LBM: Last BM Date : 09/12/24 Baseline Weight: Weight: 79.5 kg Most recent weight: Weight: 75.6 kg     Palliative Assessment/Data: PPS 10%    Billing based on MDM: High  Problems Addressed: One acute or chronic illness or injury that poses a threat to life or bodily function  Amount and/or Complexity of Data: Category 1:Review of prior external note(s) from each unique source, Review of the result(s) of each unique test, and Assessment requiring an independent historian(s) and Category 3:Discussion of management or test interpretation with external physician/other qualified health care professional/appropriate source (not separately reported)  Risks: Parenteral controlled substances   Signed by: Jeoffrey CHRISTELLA Sharps, NP   Please contact Palliative Medicine Team phone at [phone] for questions and concerns.  For individual provider: See Amion  *Portions of this note are a verbal dictation therefore any spelling and/or grammatical errors are due to the Dragon Medical One system interpretation.      [1] No Known Allergies  "

## 2024-09-13 NOTE — Plan of Care (Signed)
 12/29 decision to go comfort care.

## 2024-09-14 DIAGNOSIS — I739 Peripheral vascular disease, unspecified: Secondary | ICD-10-CM

## 2024-09-14 DIAGNOSIS — Z515 Encounter for palliative care: Secondary | ICD-10-CM

## 2024-09-14 DIAGNOSIS — I480 Paroxysmal atrial fibrillation: Secondary | ICD-10-CM | POA: Diagnosis not present

## 2024-09-14 DIAGNOSIS — I4891 Unspecified atrial fibrillation: Secondary | ICD-10-CM | POA: Diagnosis not present

## 2024-09-14 DIAGNOSIS — R570 Cardiogenic shock: Secondary | ICD-10-CM | POA: Diagnosis not present

## 2024-09-14 DIAGNOSIS — I5023 Acute on chronic systolic (congestive) heart failure: Secondary | ICD-10-CM | POA: Diagnosis not present

## 2024-09-14 NOTE — Progress Notes (Addendum)
**  9050 This chaplain responded to unit consult for EOL spiritual care. The Pt. grandson, friend-William, and William's girlfriend are visiting at the bedside. The chaplain entered with PMT PA-Josseline and began rapport building. The RN is assisting with the Pt. comfort.  **1019 The chaplain exited and planned a revisit after PMT visit. The Pt. is resting comfortably at this time. The chaplain understands through reflective listening the Pt. has a circle of close friends. Family and friends present at the bedside today, plan to visit [facility] to aid in the decision of hospice placement.    An invitation for F/U spiritual was extended to the Pt. family and friends. The chaplain understands the family will page spiritual care as needed.  Chaplain Leeroy Hummer [phone]

## 2024-09-14 NOTE — Plan of Care (Signed)
   Problem: Education: Goal: Knowledge of General Education information will improve Description Including pain rating scale, medication(s)/side effects and non-pharmacologic comfort measures Outcome: Progressing

## 2024-09-14 NOTE — Plan of Care (Signed)
  Problem: Coping: Goal: Level of anxiety will decrease Outcome: Progressing   Problem: Pain Managment: Goal: General experience of comfort will improve and/or be controlled Outcome: Progressing   Problem: Skin Integrity: Goal: Risk for impaired skin integrity will decrease Outcome: Progressing

## 2024-09-14 NOTE — TOC Progression Note (Signed)
 Transition of Care [facility]) - Progression Note    Patient Details  Name: Monica Zamora MRN: [id number] Date of Birth: September 05, 1951  [facility]) CM/SW Contact  Tarri Guilfoil LITTIE Moose, [place] Phone Number: 09/14/2024, 3:53 PM  Clinical Narrative:    Per palliative, family is deciding whether they would like to pursue residential hospice (They would prefer [facility]). Palliative stated family wanted to tour facility before making a decision but she has not heard back from them yet. CSW informed palliative that the only contacts listed on pt facesheet are friends. CSW will continue to follow.   Expected Discharge Plan: Home w Home Health Services Barriers to Discharge: Continued Medical Work up               Expected Discharge Plan and Services   Discharge Planning Services: CM Consult Post Acute Care Choice: Home Health Living arrangements for the past 2 months: Single Family Home                   DME Agency: NA       HH Arranged: NA           Social Drivers of Health (SDOH) Interventions SDOH Screenings   Food Insecurity: No Food Insecurity (08/30/2024)  Housing: Low Risk (08/30/2024)  Transportation Needs: No Transportation Needs (08/30/2024)  Utilities: Not At Risk (08/30/2024)  Alcohol  Screen: Low Risk (07/29/2024)  Financial Resource Strain: Low Risk (07/29/2024)  Physical Activity: Unknown (12/10/2023)   Received from [facility]  Social Connections: Socially Isolated (08/30/2024)  Stress: No Stress Concern Present (12/10/2023)   Received from [facility]  Recent Concern: Stress - Stress Concern Present (11/06/2023)   Received [facility]  Tobacco Use: Medium Risk (08/31/2024)    Readmission Risk Interventions     No data to display

## 2024-09-14 NOTE — Progress Notes (Signed)
 "          [facility]                                                                              Monica Zamora, is a 73 y.o. female, DOB - Nov 12, 1950, FMW:[id number] Admit date - 08/29/2024    Outpatient Primary MD for the patient is Hemberg, Comer GAILS, NP  LOS - 16  days  Chief Complaint  Patient presents with   Atrial Fibrillation       Brief summary   Patient is a 73 year old female with HTN, hyperlipidemia, CAD status post CABG in 2021, HFrEF with EF 20 to 25% on echo 07/2024, A-fib on Eliquis , COPD with chronic hypoxic respiratory failure on 2 L O2, former tobacco use, PUD/GIB in 07/2024, CKD stage III AA, recent admission 11/12 11/18 for A-fib with RVR, GI bleed.  Patient presented on 12/15 for lower extremity edema and volume overload with shortness of breath. Patient was seen in follow up by Cardiology 12/15 and advised to present to ED for further evaluation and management of persistent AF/RVR, volume overload and urinary retention. Seen by Cardiology on ED arrival with recommendation for increased BB dosage and ongoing diuresis as tolerated. Admitted by TRH. Underwent close monitoring, attempted rate control for AF/RVR management and diuresis for management of volume overload. Developed AKI on CKD (in the setting of aggressive diuresis; this was adjusted.) GDMT limited due to kidney disease, frequent UTIs and COPD. AHF team consulted with plan for TEE/DCCV. Underwent TEE/DCCV 12/22 ([facility]) with conversion to NSR.    Post-TEE, patient reported lightheadedness and SOB; she became tachypneic with RR 28-32. CXR demonstrated diffuse interstitial coarsening favoring pulmonary edema, small bilateral pleural effusions. On 12/22, patient was noted to be hypotensive with SBP 80s and concern for cardiogenic shock. Transferred to 2H.  Remained on dobutamine  and Levophed  however not making much progress.  Went back into atrial fibrillation with RVR, was cardioverted on 12/27.   On 12/29, goals of care meeting and palliative is not consulted.  Per family's request, patient was transition to comfort care. Patient was transferred to floor and TRH assumed care on 12/31  Assessment & Plan       Acute on chronic systolic congestive heart failure (HCC)   CAD (coronary artery disease)   Essential hypertension   Paroxysmal atrial fibrillation (HCC)   CKD stage 3a, GFR 45-59 ml/min (HCC)   COPD (chronic obstructive pulmonary disease) (HCC)   PVD (peripheral vascular disease)   Hyperlipidemia   Depression   Persistent atrial fibrillation (HCC)   Hx of CABG   Atherosclerosis of native coronary artery of native heart without angina pectoris   AKI (acute kidney injury)   Biventricular HF (heart failure) (HCC)   Cardiogenic shock (HCC)   -Patient was transitioned to comfort care on 12/29 - Palliative medicine consulted, appreciate support. - Expecting inpatient hospital death   Estimated body mass index is 26.9 kg/m as calculated from the following:   Height as of this encounter: 5' 6 (1.676 m).   Weight as of this encounter: 75.6 kg.  Code Status: DNR comfort care DVT Prophylaxis:     Level of Care: Level of care:  Med-Surg Family Communication: Disposition Plan:      Remains inpatient appropriate:      Procedures:    Consultants:    Critical care Cardiology Palliative medicine  Antimicrobials:   Anti-infectives (From admission, onward)    Start     Dose/Rate Route Frequency Ordered Stop   09/07/24 1015  cefTRIAXone  (ROCEPHIN ) 2 g in sodium chloride  0.9 % 100 mL IVPB        2 g 200 mL/hr over 30 Minutes Intravenous Every 24 hours 09/07/24 0920 09/09/24 1052   09/06/24 1900  Ampicillin -Sulbactam (UNASYN ) 3 g in sodium chloride  0.9 % 100 mL IVPB  Status:  Discontinued        3 g 200 mL/hr over 30 Minutes Intravenous Every 12 hours 09/06/24 1814 09/07/24 0920          Medications  antiseptic oral rinse  15 mL Mouth Rinse BID     morphine  injection  2 mg Intravenous Q4H   sodium chloride  flush  10-40 mL Intracatheter Q12H      Subjective:   Monica Zamora was seen and examined today.  Lethargic, appears somewhat uncomfortable.  Not following any commands.   Objective:   Vitals:   09/13/24 1000 09/13/24 1100 09/13/24 1506 09/14/24 0642  BP:   (!) 124/49 (!) 128/51  Pulse: 82 84 89 81  Resp: (!) 25 (!) 25 (!) 25 20  Temp:   (!) 97.5 F (36.4 C)   TempSrc:   Axillary   SpO2: (!) 61% (!) 57% (!) 81% (!) 80%  Weight:      Height:        Intake/Output Summary (Last 24 hours) at 09/14/2024 1305 Last data filed at 09/14/2024 9347 Gross per 24 hour  Intake 0 ml  Output 800 ml  Net -800 ml     Wt Readings from Last 3 Encounters:  09/12/24 75.6 kg  08/29/24 79.4 kg  08/23/24 79.8 kg     Exam General: Not in any acute distress however ill-appearing, somewhat uncomfortable Cardiovascular: S1 S2 auscultated,  RRR Respiratory: Mild tachypnea Gastrointestinal: Soft, nontender, nondistended, + bowel sounds Ext: 1+ pedal edema bilaterally Psych: lethargic, not following any commands    Data Reviewed:  I have personally reviewed following labs    CBC Lab Results  Component Value Date   WBC 5.6 09/12/2024   RBC 2.45 (L) 09/12/2024   HGB 8.5 (L) 09/12/2024   HCT 27.5 (L) 09/12/2024   MCV 112.2 (H) 09/12/2024   MCH 34.7 (H) 09/12/2024   PLT 207 09/12/2024   MCHC 30.9 09/12/2024   RDW 14.7 09/12/2024   LYMPHSABS 1.2 08/29/2024   MONOABS 0.5 08/29/2024   EOSABS 0.1 08/29/2024   BASOSABS 0.0 08/29/2024     Last metabolic panel Lab Results  Component Value Date   NA 133 (L) 09/12/2024   K 4.8 09/12/2024   CL 92 (L) 09/12/2024   CO2 31 09/12/2024   BUN 26 (H) 09/12/2024   CREATININE 1.99 (H) 09/12/2024   GLUCOSE 245 (H) 09/12/2024   GFRNONAA 26 (L) 09/12/2024   CALCIUM  8.5 (L) 09/12/2024   PHOS 3.9 09/04/2024   PROT 6.1 (L) 09/11/2024   ALBUMIN 3.2 (L) 09/11/2024   BILITOT 0.5  09/11/2024   ALKPHOS 187 (H) 09/11/2024   AST 44 (H) 09/11/2024   ALT 73 (H) 09/11/2024   ANIONGAP 11 09/12/2024    CBG (last 3)  Recent Labs    09/12/24 0624 09/12/24 1223 09/12/24 1633  GLUCAP 135* 137* 168*  Coagulation Profile: No results for input(s): INR, PROTIME in the last 168 hours.   Radiology Studies: I have personally reviewed the imaging studies  No results found.     Nydia Distance M.D. [facility] 09/14/2024, 1:05 PM  Available via Epic secure chat 7am-7pm After 7 pm, please refer to night coverage provider listed on amion.    "

## 2024-09-14 NOTE — Progress Notes (Signed)
 "                                                                                                                                                         Daily Progress Note   Patient Name: Monica Zamora       Date: 09/14/2024 DOB: 07-22-1951  Age: 73 y.o. MRN#: [id number] Attending Physician: Davia Nydia POUR, MD Primary Care Physician: [name], NP Admit Date: 08/29/2024  Reason for Consultation/Follow-up: Establishing goals of care  Subjective: Medical records reviewed including progress notes, labs, imaging and MAR. Patient received 1 dose of PRN ativan  and PRN haldol  in the past 24 hours. No PRN morphine  required since starting scheduled dosing yesterday. Patient assessed at the bedside. Discussed with RN.  Friend/son's significant other at bedside requesting to defer 1 dose of scheduled morphine  and reassess at next scheduled dose.  Grandson also present visiting.  Created space and opportunity for patient and family's thoughts and feelings on patient's current illness.  They are appreciative of chaplain visit.  We discussed patient's status today and option to proceed with referral to hospice facility given that she is stable for transfer.  The referral process was reviewed in detail.  They would prefer [facility].  Family attempted to discuss with patient to elicit her preference, however she is unable to discuss given recent administration of as needed Ativan .  They will discuss as able with her later today prior to making a decision.  Comfort care orders and rationale were reviewed with patient's family.  Patient's family understand that scheduled morphine  has decreased her need for frequent pushes as needed.  They will monitor for worsening symptoms and request a PRN dose if needed before next scheduled dose.  They also understand patient may keep oxygen off if this is preferred for her comfort.  No other needs at this time.  Questions and concerns addressed. PMT will  continue to support holistically.   Length of Stay: 16   Physical Exam Vitals and nursing note reviewed.  Constitutional:      General: She is not in acute distress.    Appearance: She is ill-appearing.     Interventions: Nasal cannula in place.     Comments: 3.5 L [place] decreased to 2 L without difficulty  HENT:     Head: Normocephalic and atraumatic.  Cardiovascular:     Rate and Rhythm: Normal rate.  Pulmonary:     Effort: Pulmonary effort is normal. No respiratory distress.  Skin:    General: Skin is warm and dry.  Neurological:     Mental Status: She is lethargic.  Psychiatric:        Mood and Affect: Mood normal.            Vital Signs: BP (!) 128/51 (BP Location: Left Arm)  Pulse 81   Temp (!) 97.5 F (36.4 C) (Axillary)   Resp 20   Ht 5' 6 (1.676 m)   Wt 75.6 kg   SpO2 (!) 80%   BMI 26.90 kg/m  SpO2: SpO2: (!) 80 % O2 Device: O2 Device: Nasal Cannula O2 Flow Rate: O2 Flow Rate (L/min): 2 L/min      Palliative Assessment/Data: 20%   Palliative Care Assessment & Plan   Patient Profile: 73 y.o. female  with past medical history of persistent A-fib on Eliquis , HFrEF, CAD s/p CABG in 2021, carotid artery disease, COPD, chronic hypoxic respiratory failure on 2 L [place], CKD 3A, HLD, HTN, PVD and PUD who was sent by her outpatient cardiologist for evaluation of CHF exacerbation and A-fib with RVR.  Patient was admitted on 08/29/2024 with persistent A-fib with RVR, acute on chronic systolic heart failure, acute urinary retention, bilateral lower extremity edema.    Of note, patient has had 2 admissions in the last 6 months.  She is a 30-day readmission.  Assessment: End-of-life care  Recommendations/Plan: Continue DNR/DNI Continue comfort focused care Will continue IV morphine  as scheduled, every 4 hours.  No further adjustments required today Psychosocial and emotional support provided Patient's family are considering hospice facility, [facility] would be  preference PMT will continue to follow and support   Prognosis:  < 2 weeks  Discharge Planning: To Be Determined  Care plan was discussed with patient, patient's family, chaplain, RN, MD, LCSW, and CM   Hilary Milks SHAUNNA Fell, PA-C  Palliative Medicine Team Team phone # [phone]  Thank you for allowing the Palliative Medicine Team to assist in the care of this patient. Please utilize secure chat with additional questions, if there is no response within 30 minutes please call the above phone number.  Palliative Medicine Team providers are available by phone from 7am to 7pm daily and can be reached through the team cell phone.  Should this patient require assistance outside of these hours, please call the patient's attending physician.    MDM: High  Problems Addressed: One acute or chronic illness or injury that poses a threat to life or bodily function  Amount and/or Complexity of Data: Category 1:Review of prior external note(s) from each unique source, Review of the result(s) of each unique test, and Assessment requiring an independent historian(s), Category 2:Independent interpretation of a test performed by another physician/other qualified health care professional (not separately reported), and Category 3:Discussion of management or test interpretation with external physician/other qualified health care professional/appropriate source (not separately reported)  Risks: Parenteral controlled substances  "

## 2024-09-15 DIAGNOSIS — I480 Paroxysmal atrial fibrillation: Secondary | ICD-10-CM | POA: Diagnosis not present

## 2024-09-15 DIAGNOSIS — I4891 Unspecified atrial fibrillation: Secondary | ICD-10-CM | POA: Diagnosis not present

## 2024-09-15 DIAGNOSIS — I5023 Acute on chronic systolic (congestive) heart failure: Secondary | ICD-10-CM | POA: Diagnosis not present

## 2024-09-15 DIAGNOSIS — Z515 Encounter for palliative care: Secondary | ICD-10-CM | POA: Diagnosis not present

## 2024-09-15 NOTE — Progress Notes (Signed)
 "          [facility]                                                                              Monica Zamora, is a 74 y.o. female, DOB - 1951-06-19, FMW:[id number] Admit date - 08/29/2024    Outpatient Primary MD for the patient is Hemberg, Comer GAILS, NP  LOS - 17  days  Chief Complaint  Patient presents with   Atrial Fibrillation       Brief summary   Patient is a 74 year old female with HTN, hyperlipidemia, CAD status post CABG in 2021, HFrEF with EF 20 to 25% on echo 07/2024, A-fib on Eliquis , COPD with chronic hypoxic respiratory failure on 2 L O2, former tobacco use, PUD/GIB in 07/2024, CKD stage III AA, recent admission 11/12 11/18 for A-fib with RVR, GI bleed.  Patient presented on 12/15 for lower extremity edema and volume overload with shortness of breath. Patient was seen in follow up by Cardiology 12/15 and advised to present to ED for further evaluation and management of persistent AF/RVR, volume overload and urinary retention. Seen by Cardiology on ED arrival with recommendation for increased BB dosage and ongoing diuresis as tolerated. Admitted by [facility]. Underwent close monitoring, attempted rate control for AF/RVR management and diuresis for management of volume overload. Developed AKI on CKD (in the setting of aggressive diuresis; this was adjusted.) GDMT limited due to kidney disease, frequent UTIs and COPD. AHF team consulted with plan for TEE/DCCV. Underwent TEE/DCCV 12/22 ([facility]) with conversion to NSR.    Post-TEE, patient reported lightheadedness and SOB; she became tachypneic with RR 28-32. CXR demonstrated diffuse interstitial coarsening favoring pulmonary edema, small bilateral pleural effusions. On 12/22, patient was noted to be hypotensive with SBP 80s and concern for cardiogenic shock. Transferred to 2H.  Remained on dobutamine  and Levophed  however not making much progress.  Went back into atrial fibrillation with RVR, was cardioverted on 12/27.   On 12/29, goals of care meeting and palliative is not consulted.  Per family's request, patient was transition to comfort care. Patient was transferred to floor and [facility] assumed care on 12/31  Assessment & Plan       Acute on chronic systolic congestive heart failure (HCC)   CAD (coronary artery disease)   Essential hypertension   Paroxysmal atrial fibrillation (HCC)   CKD stage 3a, GFR 45-59 ml/min (HCC)   COPD (chronic obstructive pulmonary disease) (HCC)   PVD (peripheral vascular disease)   Hyperlipidemia   Depression   Persistent atrial fibrillation (HCC)   Hx of CABG   Atherosclerosis of native coronary artery of native heart without angina pectoris   AKI (acute kidney injury)   Biventricular HF (heart failure) (HCC)   Cardiogenic shock (HCC)   -Patient was transitioned to comfort care on 12/29 - Palliative medicine consulted, appreciate support. - Appears comfortable   Estimated body mass index is 26.9 kg/m as calculated from the following:   Height as of this encounter: 5' 6 (1.676 m).   Weight as of this encounter: 75.6 kg.  Code Status: DNR comfort care DVT Prophylaxis:     Level of Care: Level of care: Med-Surg Family  Communication: Disposition Plan:      Remains inpatient appropriate:   Family considering residential hospice, [facility] place   Procedures:    Consultants:    Critical care Cardiology Palliative medicine  Antimicrobials:   Anti-infectives (From admission, onward)    Start     Dose/Rate Route Frequency Ordered Stop   09/07/24 1015  cefTRIAXone  (ROCEPHIN ) 2 g in sodium chloride  0.9 % 100 mL IVPB        2 g 200 mL/hr over 30 Minutes Intravenous Every 24 hours 09/07/24 0920 09/09/24 1052   09/06/24 1900  Ampicillin -Sulbactam (UNASYN ) 3 g in sodium chloride  0.9 % 100 mL IVPB  Status:  Discontinued        3 g 200 mL/hr over 30 Minutes Intravenous Every 12 hours 09/06/24 1814 09/07/24 0920          Medications  antiseptic oral  rinse  15 mL Mouth Rinse BID    morphine  injection  2 mg Intravenous Q4H   sodium chloride  flush  10-40 mL Intracatheter Q12H      Subjective:   Monica Zamora was seen and examined today.  Unresponsive, appears comfortable.  Objective:   Vitals:   09/13/24 1100 09/13/24 1506 09/14/24 0642 09/14/24 2029  BP:  (!) 124/49 (!) 128/51 (!) 94/48  Pulse: 84 89 81 (!) 102  Resp: (!) 25 (!) 25 20 20   Temp:  (!) 97.5 F (36.4 C)  98.8 F (37.1 C)  TempSrc:  Axillary  Oral  SpO2: (!) 57% (!) 81% (!) 80% (!) 85%  Weight:      Height:       No intake or output data in the 24 hours ending 09/15/24 1329    Wt Readings from Last 3 Encounters:  09/12/24 75.6 kg  08/29/24 79.4 kg  08/23/24 79.8 kg    Physical Exam General: Unresponsive  Cardiovascular: S1 S2 clear, RRR.  Respiratory: CTAB Gastrointestinal: Soft, nondistended, NBS Ext: no pedal edema bilaterally Neuro: Unresponsive, not following any commands Psych: Unresponsive    Data Reviewed:  I have personally reviewed following labs    CBC Lab Results  Component Value Date   WBC 5.6 09/12/2024   RBC 2.45 (L) 09/12/2024   HGB 8.5 (L) 09/12/2024   HCT 27.5 (L) 09/12/2024   MCV 112.2 (H) 09/12/2024   MCH 34.7 (H) 09/12/2024   PLT 207 09/12/2024   MCHC 30.9 09/12/2024   RDW 14.7 09/12/2024   LYMPHSABS 1.2 08/29/2024   MONOABS 0.5 08/29/2024   EOSABS 0.1 08/29/2024   BASOSABS 0.0 08/29/2024     Last metabolic panel Lab Results  Component Value Date   NA 133 (L) 09/12/2024   K 4.8 09/12/2024   CL 92 (L) 09/12/2024   CO2 31 09/12/2024   BUN 26 (H) 09/12/2024   CREATININE 1.99 (H) 09/12/2024   GLUCOSE 245 (H) 09/12/2024   GFRNONAA 26 (L) 09/12/2024   CALCIUM  8.5 (L) 09/12/2024   PHOS 3.9 09/04/2024   PROT 6.1 (L) 09/11/2024   ALBUMIN 3.2 (L) 09/11/2024   BILITOT 0.5 09/11/2024   ALKPHOS 187 (H) 09/11/2024   AST 44 (H) 09/11/2024   ALT 73 (H) 09/11/2024   ANIONGAP 11 09/12/2024    CBG (last 3)   Recent Labs    09/12/24 1633  GLUCAP 168*      Coagulation Profile: No results for input(s): INR, PROTIME in the last 168 hours.   Radiology Studies: I have personally reviewed the imaging studies  No results found.  Nydia Distance M.D. [facility] 09/15/2024, 1:29 PM  Available via Epic secure chat 7am-7pm After 7 pm, please refer to night coverage provider listed on amion.    "

## 2024-09-15 NOTE — Plan of Care (Signed)
   Problem: Coping: Goal: Level of anxiety will decrease Outcome: Progressing

## 2024-09-15 NOTE — Plan of Care (Signed)
  Problem: Pain Managment: Goal: General experience of comfort will improve and/or be controlled Outcome: Progressing   Problem: Safety: Goal: Ability to remain free from injury will improve Outcome: Progressing

## 2024-09-15 NOTE — Progress Notes (Signed)
 "                                                                                                                                                         Daily Progress Note   Patient Name: Monica Zamora       Date: 09/15/2024 DOB: 12/27/1950  Age: 74 y.o. MRN#: [id number] Attending Physician: Davia Nydia POUR, MD Primary Care Physician: [name], NP Admit Date: 08/29/2024  Reason for Consultation/Follow-up: Establishing goals of care  Subjective: Medical records reviewed including progress notes, labs, imaging and MAR. Patient received 3 doses of PRN ativan  and 1 dose of PRN haldol  in the past 24 hours. No PRN morphine  required since starting scheduled dosing. Patient assessed at the bedside. She is resting comfortably without signs of distress. Noted that she was back up to 3.5 L O2. Weaned down to 2L. Discussed with RN.  Patient's grandson and two closest friends, Elsie and [place], were present visiting. We reviewed patient's decline since yesterday.  Discussed role of patient's anxiolytics and opioids in her mental status, provided education that it is likely her body transitioning closer to end-of-life rather than medications.  Also noted that she was hypotensive this morning.  They have noticed a bad smell of her hand, which may be due to sweating.  They have toured [employer], however they have not yet been able to discuss with patient given that her interactions have been very brief before she goes back to sleep.  She has not eaten or had anything to drink in a couple of days.  They are also worried about her ability to tolerate the transfer.  They prefer to remain in hospital for death with all of this in mind.  Questions and concerns addressed. PMT will continue to support holistically.   Length of Stay: 17   Physical Exam Vitals and nursing note reviewed.  Constitutional:      General: She is not in acute distress.    Appearance: She is ill-appearing.      Interventions: Nasal cannula in place.     Comments: 3.5 L [place] decreased to 2 L without difficulty  HENT:     Head: Normocephalic and atraumatic.  Cardiovascular:     Rate and Rhythm: Normal rate.  Pulmonary:     Effort: Pulmonary effort is normal. No respiratory distress.  Skin:    General: Skin is warm and dry.  Neurological:     Mental Status: She is lethargic.            Vital Signs: BP (!) 94/48 (BP Location: Left Arm)   Pulse (!) 102   Temp 98.8 F (37.1 C) (Oral)   Resp 20   Ht 5' 6 (1.676 m)   Wt 75.6 kg   SpO2 (!) 85%   BMI  26.90 kg/m  SpO2: SpO2: (!) 85 % O2 Device: O2 Device: Nasal Cannula O2 Flow Rate: O2 Flow Rate (L/min): 2 L/min      Palliative Assessment/Data: 20%   Palliative Care Assessment & Plan   Patient Profile: 74 y.o. female  with past medical history of persistent A-fib on Eliquis , HFrEF, CAD s/p CABG in 2021, carotid artery disease, COPD, chronic hypoxic respiratory failure on 2 L [place], CKD 3A, HLD, HTN, PVD and PUD who was sent by her outpatient cardiologist for evaluation of CHF exacerbation and A-fib with RVR.  Patient was admitted on 08/29/2024 with persistent A-fib with RVR, acute on chronic systolic heart failure, acute urinary retention, bilateral lower extremity edema.    Of note, patient has had 2 admissions in the last 6 months.  She is a 30-day readmission.  Assessment: End-of-life care  Recommendations/Plan: Continue DNR/DNI Continue comfort focused care, no adjustments required today NO further escalation of oxygen.  Provide IV morphine  for dyspnea Psychosocial and emotional support provided Patient's family are deferring hospice facility placement at this time given concern for change in status compared to yesterday PMT will continue to follow and support   Prognosis:  < 2 weeks  Discharge Planning: Anticipated Hospital Death  Care plan was discussed with patient, patient's family, primary nurse, LCSW   Cong Hightower SHAUNNA Fell, PA-C  Palliative Medicine Team Team phone # [phone]  Thank you for allowing the Palliative Medicine Team to assist in the care of this patient. Please utilize secure chat with additional questions, if there is no response within 30 minutes please call the above phone number.  Palliative Medicine Team providers are available by phone from 7am to 7pm daily and can be reached through the team cell phone.  Should this patient require assistance outside of these hours, please call the patient's attending physician.    Time Total: 35  Visit consisted of counseling and education dealing with the complex and emotionally intense issues of symptom management and palliative care in the setting of serious and potentially life-threatening illness. Greater than 50% of this time was spent counseling and coordinating care related to the above assessment and plan.  Personally spent 35 minutes in patient care including extensive chart review (labs, imaging, progress/consult notes, vital signs), medically appropraite exam, discussed with treatment team, education to patient, family, and staff, documenting clinical information, medication review and management, coordination of care, and available advanced directive documents.    "

## 2024-09-15 DEATH — deceased

## 2024-09-16 ENCOUNTER — Telehealth: Payer: Self-pay | Admitting: Acute Care

## 2024-09-16 DIAGNOSIS — Z515 Encounter for palliative care: Secondary | ICD-10-CM | POA: Diagnosis not present

## 2024-09-16 DIAGNOSIS — I4891 Unspecified atrial fibrillation: Secondary | ICD-10-CM | POA: Diagnosis not present

## 2024-09-16 DIAGNOSIS — I5023 Acute on chronic systolic (congestive) heart failure: Secondary | ICD-10-CM | POA: Diagnosis not present

## 2024-09-16 MED ORDER — GLYCOPYRROLATE 0.2 MG/ML IJ SOLN: 0.4000 mg | INTRAMUSCULAR | Status: DC

## 2024-09-19 NOTE — Telephone Encounter (Signed)
 Per MD note patient passed away.

## 2024-09-21 ENCOUNTER — Ambulatory Visit: Admitting: Gastroenterology

## 2024-10-16 NOTE — Death Summary Note (Addendum)
 "  DEATH SUMMARY   Patient Details  Name: Monica Zamora MRN: [id number] DOB: [birth year] ERE:Yzfazmh, Comer LULLA, NP Admission/Discharge Information   Admit Date:  09/18/2024  Date of Death: Date of Death: 10-06-2024  Time of Death: Time of Death: 10/09/06  Length of Stay: 22-Oct-2024   Principle Cause of death: Acute on chronic systolic CHF with cardiogenic shock  Hospital Diagnoses:    Acute on chronic systolic congestive heart failure (HCC)  Biventricular failure with cardiogenic shock  Acute kidney injury  Persistent atrial fibrillation with RVR  Acute kidney injury on CKD stage 3a, GFR 45-59 ml/min (HCC)   CAD (coronary artery disease) with history of CABG   Essential hypertension   Chronic respiratory failure, O2 dependent COPD (chronic obstructive pulmonary disease) (HCC)   PVD (peripheral vascular disease)   Hyperlipidemia   Depression   Persistent atrial fibrillation (HCC)   Atherosclerosis of native coronary artery of native heart without angina pectoris Shock liver Anemia chronic disease, history of GI bleed   Hospital Course: Patient was a 74 year old female with HTN, hyperlipidemia, CAD status post CABG in 10-10-19, HFrEF with EF 20 to 25% on echo 07/2024, A-fib on Eliquis , COPD with chronic hypoxic respiratory failure on 2 L O2, former tobacco use, PUD/GIB in 07/2024, CKD stage III AA, recent admission 11/12 11/18 for A-fib with RVR, GI bleed.  Patient presented on 2025-09-18 for lower extremity edema and volume overload with shortness of breath. Patient was seen in follow up by Cardiology 18-Sep-2025 and advised to present to ED for further evaluation and management of persistent AF/RVR, volume overload and urinary retention. Seen by Cardiology on ED arrival with recommendation for increased BB dosage and ongoing diuresis as tolerated. Admitted by [facility]. Attempted rate control for AF/RVR management and diuresis for management of volume overload. Developed AKI on CKD (in the setting of  aggressive diuresis; this was adjusted.) GDMT limited due to kidney disease, frequent UTIs and COPD. AHF team consulted with plan for TEE/DCCV. Underwent TEE/DCCV 12/22 (Dr Cherrie) with conversion to NSR.    Post-TEE, patient reported lightheadedness and SOB; she became tachypneic with RR 28-32. CXR demonstrated diffuse interstitial coarsening favoring pulmonary edema, small bilateral pleural effusions. On 12/22, patient was noted to be hypotensive with SBP 80s and concern for cardiogenic shock. Transferred to 2H.  Remained on dobutamine  and Levophed  however not making much progress.  Went back into atrial fibrillation with RVR, was cardioverted on 12/27.  On 12/29, goals of care meeting.  Palliative medicine was consulted.  Per family's request, patient was transitioned to comfort care.  Patient passed on 10-06-24 at 11:08 AM.       Procedures: TEE  Consultations: Pulmonary critical care, cardiology, palliative medicine  The results of significant diagnostics from this hospitalization (including imaging, microbiology, ancillary and laboratory) are listed below for reference.   Significant Diagnostic Studies: DG CHEST PORT 1 VIEW Result Date: 09/10/2024 EXAM: 1 VIEW(S) XRAY OF THE CHEST 09/10/2024 05:05:00 PM COMPARISON: 09/06/2024 x-ray, CT chest 08/26/2024. CLINICAL HISTORY: Dyspnea. FINDINGS: LINES, TUBES AND DEVICES: Right PICC line in place with tip at SVC level. LUNGS AND PLEURA: Mild pulmonary edema. And/or development of right lung patchy airspace opacities. Interval development of bilateral Mild diffuse interstitial prominence. Trace bilateral pleural effusions. No pneumothorax. HEART AND MEDIASTINUM: Unchanged cardiomediastinal silhouette. CABG markers noted. Aortic atherosclerosis. BONES AND SOFT TISSUES: Sternal wires noted. Artifact overlies the mid medial right lung. No acute osseous abnormality. IMPRESSION: 1. Mild pulmonary edema. 2. Interval development of right lung  patchy  airspace opacities. 3. Trace bilateral pleural effusions. Electronically signed by: Morgane Naveau MD 09/10/2024 06:29 PM EST RP Workstation: [id number]   DG CHEST PORT 1 VIEW Result Date: 09/06/2024 EXAM: 1 VIEW(S) XRAY OF THE CHEST 09/06/2024 06:00:00 PM COMPARISON: 09/06/2024 CLINICAL HISTORY: Respiratory distress FINDINGS: LINES, TUBES AND DEVICES: Right upper extremity PICC in place with tip at superior cavoatrial junction. LUNGS AND PLEURA: Bibasilar atelectasis and/or infiltrates. Pulmonary vascular congestion. Small bilateral pleural effusions. No pneumothorax. HEART AND MEDIASTINUM: Stable mild cardiomegaly. CABG markers noted. BONES AND SOFT TISSUES: Nondisplaced median sternotomy wires. IMPRESSION: 1. Bibasilar atelectasis and/or infiltrates. 2. Cardiomegaly and pulmonary vascular congestion. 3. Small bilateral pleural effusions. Electronically signed by: Norman Gatlin MD 09/06/2024 07:27 PM EST RP Workstation: [id number]   DG Chest Port 1 View Result Date: 09/06/2024 CLINICAL DATA:  Status post PICC placement. EXAM: PORTABLE CHEST 1 VIEW COMPARISON:  09/05/2024. FINDINGS: Right-sided PICC with tip over central SVC. There is shallow inspiration. Left lung base atelectasis/scarring versus pneumonia. A small left pleural effusion suspected. No pneumothorax. Stable cardiomegaly. Median sternotomy wires. No acute osseous pathology. IMPRESSION: 1. Right-sided PICC with tip over central SVC. 2. Left lung base atelectasis/scarring versus pneumonia. Electronically Signed   By: Vanetta Chou M.D.   On: 09/06/2024 14:40   US  EKG SITE RITE Result Date: 09/06/2024 If Site Rite image not attached, placement could not be confirmed due to current cardiac rhythm.  DG Chest Port 1 View Result Date: 09/05/2024 EXAM: 1 VIEW(S) XRAY OF THE CHEST 09/05/2024 08:43:00 PM COMPARISON: 08/29/2024 CLINICAL HISTORY: Shortness of breath FINDINGS: LUNGS AND PLEURA: Diffuse interstitial coarsening, increased from  prior . Small bilateral pleural effusions. Patchy opacities in lung bases. No pneumothorax. HEART AND MEDIASTINUM: Heart size at upper limits. Aortic atherosclerosis. CABG markers noted. BONES AND SOFT TISSUES: Sternotomy wires noted. No acute osseous abnormality. IMPRESSION: 1. Diffuse interstitial coarsening favoring pulmonary edema. Small bilateral pleural effusions. 2. Patchy basilar opacities favoring atelectasis. 3. Infection is difficult to exclude. Electronically signed by: Norman Gatlin MD 09/05/2024 08:49 PM EST RP Workstation: [id number]   ECHO TEE Result Date: 09/05/2024    TRANSESOPHOGEAL ECHO REPORT   Patient Name:   LAELIA ANGELO Date of Exam: 09/05/2024 Medical Rec #:  [id number]           Height:       66.0 in Accession #:    [id number]          Weight:       173.9 lb Date of Birth:  12-08-1950           BSA:          1.885 m Patient Age:    73 years            BP:           87/78 mmHg Patient Gender: F                   HR:           99 bpm. Exam Location:  Inpatient Procedure: Cardiac Doppler, Color Doppler and Transesophageal Echo (Both            Spectral and Color Flow Doppler were utilized during procedure). Indications:     Cardioversion  History:         Patient has prior history of Echocardiogram examinations, most                  recent 07/27/2024. COPD; Arrythmias:Atrial Fibrillation.  Sonographer:  Tinnie Gosling RDCS Referring Phys:  [id number] AMY D CLEGG Diagnosing Phys: Toribio Fuel MD PROCEDURE: After discussion of the risks and benefits of a TEE, an informed consent was obtained from the patient. The transesophogeal probe was passed without difficulty through the esophogus of the patient. Sedation performed by different physician. The patient was monitored while under deep sedation. Anesthestetic sedation was provided intravenously by Anesthesiology: 50.78mg  of Propofol . The patient developed no complications during the procedure. A successful direct current  cardioversion was performed with 1 attempt.  IMPRESSIONS  1. Left ventricular ejection fraction, by estimation, is 20 to 25%. The left ventricle has severely decreased function. The left ventricle demonstrates global hypokinesis. The left ventricular internal cavity size was mildly dilated.  2. Right ventricular systolic function is severely reduced. The right ventricular size is normal.  3. Left atrial size was severely dilated. No left atrial/left atrial appendage thrombus was detected.  4. Right atrial size was severely dilated.  5. There is no evidence of cardiac tamponade.  6. The mitral valve is normal in structure. Moderate mitral valve regurgitation.  7. The tricuspid valve is abnormal. Tricuspid valve regurgitation is severe.  8. The aortic valve is tricuspid. There is mild calcification of the aortic valve. Aortic valve regurgitation is trivial. No aortic stenosis is present.  9. There is Severe (Grade IV) multilobular plaque involving the descending aorta. Conclusion(s)/Recommendation(s): No LA/LAA thrombus identified. Successful cardioversion performed with restoration of normal sinus rhythm. FINDINGS  Left Ventricle: Left ventricular ejection fraction, by estimation, is 20 to 25%. The left ventricle has severely decreased function. The left ventricle demonstrates global hypokinesis. The left ventricular internal cavity size was mildly dilated. Right Ventricle: The right ventricular size is normal. No increase in right ventricular wall thickness. Right ventricular systolic function is severely reduced. Left Atrium: Left atrial size was severely dilated. No left atrial/left atrial appendage thrombus was detected. Right Atrium: Right atrial size was severely dilated. Pericardium: Trivial pericardial effusion is present. There is no evidence of cardiac tamponade. Mitral Valve: The mitral valve is normal in structure. Moderate mitral valve regurgitation. Tricuspid Valve: The tricuspid valve is abnormal.  Tricuspid valve regurgitation is severe. Aortic Valve: The aortic valve is tricuspid. There is mild calcification of the aortic valve. Aortic valve regurgitation is trivial. No aortic stenosis is present. Pulmonic Valve: The pulmonic valve was normal in structure. Pulmonic valve regurgitation is trivial. Aorta: The aortic root is normal in size and structure. There is severe (Grade IV) multilobular plaque involving the descending aorta. IAS/Shunts: The interatrial septum appears to be lipomatous. No atrial level shunt detected by color flow Doppler. There is no evidence of a patent foramen ovale. There is no evidence of an atrial septal defect. Toribio Fuel MD Electronically signed by Toribio Fuel MD Signature Date/Time: 09/05/2024/8:40:50 AM    Final    EP STUDY Result Date: 09/05/2024 See surgical note for result.  VAS US  LOWER EXTREMITY VENOUS (DVT) Result Date: 09/01/2024  Lower Venous DVT Study Patient Name:  DEANE MELICK  Date of Exam:   08/30/2024 Medical Rec #: [id number]            Accession #:    [id number] Date of Birth: Jan 14, 1951            Patient Gender: F Patient Age:   69 years Exam Location:  [facility] Procedure:      VAS US  LOWER EXTREMITY VENOUS (DVT) Referring Phys: PROSPER AMPONSAH --------------------------------------------------------------------------------  Indications: Swelling.  Risk Factors: None identified.  Limitations: Poor ultrasound/tissue interface. Comparison Study: No prior studies. Performing Technologist: Cordella Collet RVT  Examination Guidelines: A complete evaluation includes B-mode imaging, spectral Doppler, color Doppler, and power Doppler as needed of all accessible portions of each vessel. Bilateral testing is considered an integral part of a complete examination. Limited examinations for reoccurring indications may be performed as noted. The reflux portion of the exam is performed with the patient in reverse Trendelenburg.   +---------+---------------+---------+-----------+----------+--------------+ RIGHT    CompressibilityPhasicitySpontaneityPropertiesThrombus Aging +---------+---------------+---------+-----------+----------+--------------+ CFV      Full           Yes      No                                  +---------+---------------+---------+-----------+----------+--------------+ SFJ      Full                                                        +---------+---------------+---------+-----------+----------+--------------+ FV Prox  Full                                                        +---------+---------------+---------+-----------+----------+--------------+ FV Mid   Full                                                        +---------+---------------+---------+-----------+----------+--------------+ FV Distal               Yes      No                                  +---------+---------------+---------+-----------+----------+--------------+ PFV      Full                                                        +---------+---------------+---------+-----------+----------+--------------+ POP      Full           Yes      No                                  +---------+---------------+---------+-----------+----------+--------------+ PTV      Full                                                        +---------+---------------+---------+-----------+----------+--------------+ PERO     Full                                                        +---------+---------------+---------+-----------+----------+--------------+   +---------+---------------+---------+-----------+----------+-------------------+  LEFT     CompressibilityPhasicitySpontaneityPropertiesThrombus Aging      +---------+---------------+---------+-----------+----------+-------------------+ CFV      Full           Yes      No                                        +---------+---------------+---------+-----------+----------+-------------------+ SFJ      Full                                                             +---------+---------------+---------+-----------+----------+-------------------+ FV Prox  Full                                                             +---------+---------------+---------+-----------+----------+-------------------+ FV Mid                  Yes      No                                       +---------+---------------+---------+-----------+----------+-------------------+ FV Distal               Yes      No                                       +---------+---------------+---------+-----------+----------+-------------------+ PFV      Full                                                             +---------+---------------+---------+-----------+----------+-------------------+ POP      Full           Yes      No                                       +---------+---------------+---------+-----------+----------+-------------------+ PTV      Full                                                             +---------+---------------+---------+-----------+----------+-------------------+ PERO                                                  Not well visualized +---------+---------------+---------+-----------+----------+-------------------+     Summary: RIGHT: - There is no  evidence of deep vein thrombosis in the lower extremity. However, portions of this examination were limited- see technologist comments above.  - No cystic structure found in the popliteal fossa.  LEFT: - There is no evidence of deep vein thrombosis in the lower extremity. However, portions of this examination were limited- see technologist comments above.  - No cystic structure found in the popliteal fossa.  *See table(s) above for measurements and observations. Electronically signed by Fonda Rim on 09/01/2024 at 7:54:03 AM.     Final    CT CHEST LUNG CA SCREEN LOW DOSE W/O CM Result Date: 08/30/2024 CLINICAL DATA:  74 year old female former smoker with 82.5 pack-year smoking history, quit smoking 3 years prior EXAM: CT CHEST WITHOUT CONTRAST LOW-DOSE FOR LUNG CANCER SCREENING TECHNIQUE: Multidetector CT imaging of the chest was performed following the standard protocol without IV contrast. RADIATION DOSE REDUCTION: This exam was performed according to the departmental dose-optimization program which includes automated exposure control, adjustment of the mA and/or kV according to patient size and/or use of iterative reconstruction technique. COMPARISON:  06/18/2023 screening chest CT FINDINGS: Cardiovascular: Mild cardiomegaly. No significant pericardial effusion/thickening. Three-vessel coronary atherosclerosis status post CABG. Atherosclerotic nonaneurysmal thoracic aorta. Normal caliber pulmonary arteries. Mediastinum/Nodes: No significant thyroid  nodules. Unremarkable esophagus. No axillary adenopathy. Mildly enlarged 1.0 cm right paratracheal node on series 2/image 20, stable and most compatible with benign reactive etiology. No new pathologically enlarged mediastinal nodes. No discrete hilar adenopathy on these noncontrast images. Lungs/Pleura: No pneumothorax. Trace layering bilateral pleural effusions. Mild centrilobular emphysema with diffuse bronchial wall thickening. No acute consolidative airspace disease or lung masses. Subpleural 5.9 mm solid posteromedial right lower lobe nodule on image 167 is new. Indistinct 4.2 mm superior segment left lower lobe nodule on image 84 is new. New mild diffuse interlobular septal thickening. Upper abdomen: Small volume perihepatic ascites. Musculoskeletal: No aggressive appearing focal osseous lesions. Mild thoracic spondylosis. Intact sternotomy wires. IMPRESSION: 1. Lung-RADS 3, probably benign findings. Short-term follow-up in 6 months is recommended with repeat low-dose chest CT  without contrast (please use the following order, CT CHEST LCS NODULE FOLLOW-UP W/O CM). Two new pulmonary nodules, largest 5.9 mm in the subpleural right lower lobe. 2. Spectrum of findings suggestive of mild congestive heart failure with mild cardiomegaly, mild interlobular septal thickening suggesting mild pulmonary edema, trace layering bilateral pleural effusions and small volume perihepatic ascites. 3. Aortic Atherosclerosis (ICD10-I70.0) and Emphysema (ICD10-J43.9). These results will be called to the ordering clinician or representative by the Radiologist Assistant, and communication documented in the PACS or [employer]. Electronically Signed   By: Selinda DELENA Blue M.D.   On: 08/30/2024 18:30   DG Chest Portable 1 View Result Date: 08/29/2024 EXAM: 1 VIEW(S) XRAY OF THE CHEST 08/29/2024 04:36:00 PM COMPARISON: CT chest dated 08/26/2024 CLINICAL HISTORY: CHF FINDINGS: LUNGS AND PLEURA: Small bilateral pleural effusions, new. Mild bibasilar opacities, likely atelectasis. No frank interstitial edema. No pneumothorax. HEART AND MEDIASTINUM: Stable cardiomegaly. Thoracic atherosclerosis. BONES AND SOFT TISSUES: Median sternotomy. No acute osseous abnormality. IMPRESSION: 1. Stable cardiomegaly. 2. New small bilateral pleural effusions. 3. No frank interstitial edema. Electronically signed by: Pinkie Pebbles MD 08/29/2024 05:19 PM EST RP Workstation: [id number]    Microbiology: No results found for this or any previous visit (from the past 240 hours).    Signed: Nydia Distance, MD 09-19-2024   "

## 2024-10-16 NOTE — Telephone Encounter (Signed)
 Pt has passed away on Sep 29, 2024 after an inpatient stay. She chose comfort care , and passed away with assistance from palliative care .

## 2024-10-16 NOTE — Progress Notes (Signed)
" °   2024/09/22 1153  Spiritual Encounters  Type of Visit Initial  Care provided to: [facility] partners present during encounter Nurse  Reason for visit Patient death  OnCall Visit No   This chaplain received a call from the nurse to visit the patient and grieving family.  When I arrived the nurse told me that the patient had just passed. I spent time with family. "

## 2024-10-16 NOTE — Progress Notes (Signed)
 "                                                                                                                                                         Daily Progress Note   Patient Name: Monica Zamora       Date: 06-Oct-2024 DOB: Oct 05, 1950  Age: 74 y.o. MRN#: [id number] Attending Physician: Monica Monica POUR, MD Primary Care Physician: Monica Comer LULLA, NP Admit Date: 08/29/2024  Reason for Consultation/Follow-up: Establishing goals of care  Subjective: Medical records reviewed including progress notes, labs, imaging and MAR. Patient assessed at the bedside. She is resting comfortably without signs of distress, though intermittently can hear oropharyngeal secretions.  She has apneic pauses, shallow breaths.  Her friend Monica Zamora is in agreement with removal of nasal cannula today.  Emotional support and therapeutic listening was provided.  Called and spoke with chaplain Monica Zamora, asked for visit at [facility] request.  Patient tolerated without distress.  Discussed with RN - requested dose of as needed Ativan  as well as first dose of scheduled Robinul.  No other needs at this time.  Questions and concerns addressed. PMT will continue to support holistically.   Length of Stay: 18   Physical Exam Vitals and nursing note reviewed.  Constitutional:      General: She is not in acute distress.    Appearance: She is ill-appearing.     Interventions: Nasal cannula in place.     Comments: 2 L removed  HENT:     Head: Normocephalic and atraumatic.  Cardiovascular:     Rate and Rhythm: Tachycardia present.  Pulmonary:     Effort: Pulmonary effort is normal. No respiratory distress.  Skin:    General: Skin is cool and dry.  Neurological:     Mental Status: She is unresponsive.            Vital Signs: BP (!) 171/158 (BP Location: Left Arm)   Pulse (!) 133   Temp 98.1 F (36.7 C) (Axillary)   Resp 16   Ht 5' 6 (1.676 m)   Wt 75.6 kg   SpO2 (!) 78%   BMI 26.90 kg/m  SpO2: SpO2: (!) 78  % O2 Device: O2 Device: Nasal Cannula O2 Flow Rate: O2 Flow Rate (L/min): 2 L/min      Palliative Assessment/Data: 10%   Palliative Care Assessment & Plan   Patient Profile: 74 y.o. female  with past medical history of persistent A-fib on Eliquis , HFrEF, CAD s/p CABG in 2021, carotid artery disease, COPD, chronic hypoxic respiratory failure on 2 L [place], CKD 3A, HLD, HTN, PVD and PUD who was sent by her outpatient cardiologist for evaluation of CHF exacerbation and A-fib with RVR.  Patient was admitted on 08/29/2024 with persistent A-fib with RVR, acute on chronic systolic heart failure, acute urinary  retention, bilateral lower extremity edema.    Of note, patient has had 2 admissions in the last 6 months.  She is a 30-day readmission.  Assessment: End-of-life care  Recommendations/Plan: Continue DNR/DNI Continue comfort focused care, no adjustments required today Psychosocial and emotional support provided PMT will continue to follow and support   Prognosis:  Hours - Days  Discharge Planning: Anticipated Hospital Death  Care plan was discussed with patient, patient's friend, RN   Monica Hazan SHAUNNA Fell, PA-C  Palliative Medicine Team Team phone # [phone]  Thank you for allowing the Palliative Medicine Team to assist in the care of this patient. Please utilize secure chat with additional questions, if there is no response within 30 minutes please call the above phone number.  Palliative Medicine Team providers are available by phone from 7am to 7pm daily and can be reached through the team cell phone.  Should this patient require assistance outside of these hours, please call the patient's attending physician.    Time Total: 35  Visit consisted of counseling and education dealing with the complex and emotionally intense issues of symptom management and palliative care in the setting of serious and potentially life-threatening illness. Greater than 50% of this time was  spent counseling and coordinating care related to the above assessment and plan.  Personally spent 35 minutes in patient care including extensive chart review (labs, imaging, progress/consult notes, vital signs), medically appropraite exam, discussed with treatment team, education to patient, family, and staff, documenting clinical information, medication review and management, coordination of care, and available advanced directive documents.    "

## 2024-10-16 NOTE — Progress Notes (Signed)
 Pt passed at 1108. Pronounced x2 RN, Kate Ada and Hadassah Amend.  MD paged.  Tammy, friend, at the bedside.  Elsie, contact listed for pt was notified by this RN and by friend Tammy as well.  [facility] called. Spoke with Dionne Garner.

## 2024-10-16 DEATH — deceased
